# Patient Record
Sex: Female | Born: 1940 | Race: Black or African American | Hispanic: No | Smoking: Never smoker
Health system: Southern US, Community
[De-identification: ages and names within clinical notes are randomized; demographics above are authoritative.]

## PROBLEM LIST (undated history)

## (undated) DIAGNOSIS — M1990 Unspecified osteoarthritis, unspecified site: Secondary | ICD-10-CM

## (undated) DIAGNOSIS — M810 Age-related osteoporosis without current pathological fracture: Secondary | ICD-10-CM

## (undated) DIAGNOSIS — T7840XA Allergy, unspecified, initial encounter: Secondary | ICD-10-CM

## (undated) DIAGNOSIS — IMO0001 Reserved for inherently not codable concepts without codable children: Secondary | ICD-10-CM

## (undated) DIAGNOSIS — R0602 Shortness of breath: Secondary | ICD-10-CM

## (undated) DIAGNOSIS — M545 Low back pain, unspecified: Secondary | ICD-10-CM

## (undated) DIAGNOSIS — C801 Malignant (primary) neoplasm, unspecified: Secondary | ICD-10-CM

## (undated) DIAGNOSIS — Z923 Personal history of irradiation: Secondary | ICD-10-CM

## (undated) DIAGNOSIS — F32A Depression, unspecified: Secondary | ICD-10-CM

## (undated) DIAGNOSIS — E785 Hyperlipidemia, unspecified: Secondary | ICD-10-CM

## (undated) DIAGNOSIS — F329 Major depressive disorder, single episode, unspecified: Secondary | ICD-10-CM

## (undated) DIAGNOSIS — T4145XA Adverse effect of unspecified anesthetic, initial encounter: Secondary | ICD-10-CM

## (undated) DIAGNOSIS — T8859XA Other complications of anesthesia, initial encounter: Secondary | ICD-10-CM

## (undated) DIAGNOSIS — I10 Essential (primary) hypertension: Secondary | ICD-10-CM

## (undated) DIAGNOSIS — C50919 Malignant neoplasm of unspecified site of unspecified female breast: Secondary | ICD-10-CM

## (undated) DIAGNOSIS — C55 Malignant neoplasm of uterus, part unspecified: Secondary | ICD-10-CM

## (undated) DIAGNOSIS — G8929 Other chronic pain: Secondary | ICD-10-CM

## (undated) HISTORY — PX: KNEE ARTHROSCOPY: SHX127

## (undated) HISTORY — DX: Malignant (primary) neoplasm, unspecified: C80.1

## (undated) HISTORY — DX: Age-related osteoporosis without current pathological fracture: M81.0

## (undated) HISTORY — PX: FOOT TENDON SURGERY: SHX958

## (undated) HISTORY — PX: REPLACEMENT TOTAL KNEE: SUR1224

## (undated) HISTORY — DX: Unspecified osteoarthritis, unspecified site: M19.90

## (undated) HISTORY — DX: Hyperlipidemia, unspecified: E78.5

## (undated) HISTORY — PX: TUBAL LIGATION: SHX77

## (undated) HISTORY — PX: DILATION AND CURETTAGE OF UTERUS: SHX78

## (undated) HISTORY — DX: Essential (primary) hypertension: I10

---

## 1968-05-24 HISTORY — PX: CHOLECYSTECTOMY: SHX55

## 2005-10-06 ENCOUNTER — Encounter: Admission: RE | Admit: 2005-10-06 | Discharge: 2005-10-06 | Payer: Self-pay | Admitting: Orthopedic Surgery

## 2006-12-28 ENCOUNTER — Encounter: Admission: RE | Admit: 2006-12-28 | Discharge: 2006-12-28 | Payer: Self-pay | Admitting: Emergency Medicine

## 2008-12-16 ENCOUNTER — Inpatient Hospital Stay (HOSPITAL_COMMUNITY): Admission: RE | Admit: 2008-12-16 | Discharge: 2008-12-19 | Payer: Self-pay | Admitting: Orthopedic Surgery

## 2008-12-20 ENCOUNTER — Ambulatory Visit: Payer: Self-pay | Admitting: *Deleted

## 2008-12-20 ENCOUNTER — Emergency Department (HOSPITAL_COMMUNITY): Admission: EM | Admit: 2008-12-20 | Discharge: 2008-12-20 | Payer: Self-pay | Admitting: Emergency Medicine

## 2008-12-20 ENCOUNTER — Encounter (INDEPENDENT_AMBULATORY_CARE_PROVIDER_SITE_OTHER): Payer: Self-pay | Admitting: Emergency Medicine

## 2010-04-06 ENCOUNTER — Inpatient Hospital Stay (HOSPITAL_COMMUNITY)
Admission: RE | Admit: 2010-04-06 | Discharge: 2010-04-09 | Payer: Self-pay | Source: Home / Self Care | Admitting: Orthopedic Surgery

## 2010-08-04 LAB — BASIC METABOLIC PANEL
CO2: 27 mEq/L (ref 19–32)
CO2: 27 mEq/L (ref 19–32)
Calcium: 8.2 mg/dL — ABNORMAL LOW (ref 8.4–10.5)
Calcium: 8.5 mg/dL (ref 8.4–10.5)
Chloride: 104 mEq/L (ref 96–112)
Creatinine, Ser: 0.86 mg/dL (ref 0.4–1.2)
GFR calc Af Amer: >60 mL/min (ref >60)
GFR calc Af Amer: >60 mL/min (ref >60)
GFR calc non Af Amer: >60 mL/min (ref >60)
Glucose, Bld: 120 mg/dL — ABNORMAL HIGH (ref 70–99)
Potassium: 4.1 mEq/L (ref 3.5–5.1)
Sodium: 135 mEq/L (ref 135–145)
Sodium: 137 mEq/L (ref 135–145)

## 2010-08-04 LAB — DIFFERENTIAL
Basophils Absolute: 0 10*3/uL (ref 0.0–0.1)
Lymphocytes Relative: 34 % (ref 12–46)
Lymphs Abs: 2.1 10*3/uL (ref 0.7–4.0)
Neutro Abs: 3.5 10*3/uL (ref 1.7–7.7)
Neutrophils Relative %: 56 % (ref 43–77)

## 2010-08-04 LAB — CBC
Hemoglobin: 12 g/dL (ref 12.0–15.0)
Hemoglobin: 15.2 g/dL — ABNORMAL HIGH (ref 12.0–15.0)
MCH: 28 pg (ref 26.0–34.0)
MCHC: 32.4 g/dL (ref 30.0–36.0)
MCV: 86.4 fL (ref 78.0–100.0)
MCV: 86.5 fL (ref 78.0–100.0)
Platelets: 161 10*3/uL (ref 150–400)
Platelets: 174 10*3/uL (ref 150–400)
RBC: 4.21 MIL/uL (ref 3.87–5.11)
RBC: 4.36 MIL/uL (ref 3.87–5.11)
WBC: 8.6 10*3/uL (ref 4.0–10.5)
WBC: 9.8 10*3/uL (ref 4.0–10.5)

## 2010-08-04 LAB — URINE CULTURE: Culture  Setup Time: 201111081150

## 2010-08-04 LAB — PROTIME-INR
INR: 0.96 (ref 0.00–1.49)
Prothrombin Time: 13 seconds (ref 11.6–15.2)

## 2010-08-04 LAB — HEMOGLOBIN A1C
Hgb A1c MFr Bld: 6.2 % — ABNORMAL HIGH (ref <5.7)
Mean Plasma Glucose: 131 mg/dL — ABNORMAL HIGH (ref <117)

## 2010-08-04 LAB — COMPREHENSIVE METABOLIC PANEL
AST: 21 U/L (ref 0–37)
BUN: 12 mg/dL (ref 6–23)
CO2: 29 mEq/L (ref 19–32)
Calcium: 9.7 mg/dL (ref 8.4–10.5)
Creatinine, Ser: 1.14 mg/dL (ref 0.4–1.2)
GFR calc Af Amer: 58 mL/min — ABNORMAL LOW (ref >60)
GFR calc non Af Amer: 48 mL/min — ABNORMAL LOW (ref >60)
Glucose, Bld: 119 mg/dL — ABNORMAL HIGH (ref 70–99)

## 2010-08-04 LAB — CROSSMATCH
Antibody Screen: NEGATIVE
Unit division: 0

## 2010-08-04 LAB — GLUCOSE, CAPILLARY
Glucose-Capillary: 102 mg/dL — ABNORMAL HIGH (ref 70–99)
Glucose-Capillary: 108 mg/dL — ABNORMAL HIGH (ref 70–99)
Glucose-Capillary: 116 mg/dL — ABNORMAL HIGH (ref 70–99)
Glucose-Capillary: 120 mg/dL — ABNORMAL HIGH (ref 70–99)
Glucose-Capillary: 123 mg/dL — ABNORMAL HIGH (ref 70–99)
Glucose-Capillary: 129 mg/dL — ABNORMAL HIGH (ref 70–99)
Glucose-Capillary: 133 mg/dL — ABNORMAL HIGH (ref 70–99)

## 2010-08-04 LAB — SURGICAL PCR SCREEN
MRSA, PCR: NEGATIVE
Staphylococcus aureus: NEGATIVE

## 2010-08-04 LAB — URINALYSIS, ROUTINE W REFLEX MICROSCOPIC
Bilirubin Urine: NEGATIVE
Hgb urine dipstick: NEGATIVE
Ketones, ur: NEGATIVE mg/dL
Nitrite: NEGATIVE
Urobilinogen, UA: 0.2 mg/dL (ref 0.0–1.0)

## 2010-08-04 LAB — APTT: aPTT: 29 seconds (ref 24–37)

## 2010-08-30 LAB — COMPREHENSIVE METABOLIC PANEL
ALT: 27 U/L (ref 0–35)
AST: 26 U/L (ref 0–37)
Albumin: 3.7 g/dL (ref 3.5–5.2)
CO2: 23 mEq/L (ref 19–32)
Chloride: 107 mEq/L (ref 96–112)
GFR calc Af Amer: >60 mL/min (ref >60)
GFR calc non Af Amer: >60 mL/min (ref >60)
Potassium: 4.3 mEq/L (ref 3.5–5.1)
Sodium: 141 mEq/L (ref 135–145)
Total Bilirubin: 0.5 mg/dL (ref 0.3–1.2)

## 2010-08-30 LAB — URINALYSIS, ROUTINE W REFLEX MICROSCOPIC
Bilirubin Urine: NEGATIVE
Bilirubin Urine: NEGATIVE
Glucose, UA: NEGATIVE mg/dL
Glucose, UA: NEGATIVE mg/dL
Glucose, UA: NEGATIVE mg/dL
Hgb urine dipstick: NEGATIVE
Hgb urine dipstick: NEGATIVE
Ketones, ur: 15 mg/dL — AB
Ketones, ur: 15 mg/dL — AB
Ketones, ur: NEGATIVE mg/dL
Ketones, ur: NEGATIVE mg/dL
Nitrite: NEGATIVE
Nitrite: NEGATIVE
Protein, ur: 100 mg/dL — AB
Protein, ur: NEGATIVE mg/dL
Specific Gravity, Urine: 1.026 (ref 1.005–1.030)
pH: 5 (ref 5.0–8.0)
pH: 5.5 (ref 5.0–8.0)
pH: 6 (ref 5.0–8.0)

## 2010-08-30 LAB — CBC
HCT: 31.9 % — ABNORMAL LOW (ref 36.0–46.0)
HCT: 33.9 % — ABNORMAL LOW (ref 36.0–46.0)
Hemoglobin: 11.4 g/dL — ABNORMAL LOW (ref 12.0–15.0)
Hemoglobin: 11.4 g/dL — ABNORMAL LOW (ref 12.0–15.0)
MCHC: 33.1 g/dL (ref 30.0–36.0)
MCHC: 33.3 g/dL (ref 30.0–36.0)
MCHC: 33.6 g/dL (ref 30.0–36.0)
MCHC: 33.6 g/dL (ref 30.0–36.0)
MCV: 85.9 fL (ref 78.0–100.0)
MCV: 86.6 fL (ref 78.0–100.0)
Platelets: 212 10*3/uL (ref 150–400)
Platelets: 220 10*3/uL (ref 150–400)
RBC: 3.89 MIL/uL (ref 3.87–5.11)
RBC: 3.98 MIL/uL (ref 3.87–5.11)
RBC: 4.54 MIL/uL (ref 3.87–5.11)
RBC: 5.07 MIL/uL (ref 3.87–5.11)
WBC: 14 10*3/uL — ABNORMAL HIGH (ref 4.0–10.5)
WBC: 6.6 10*3/uL (ref 4.0–10.5)

## 2010-08-30 LAB — POCT I-STAT, CHEM 8
BUN: 10 mg/dL (ref 6–23)
Calcium, Ion: 1.11 mmol/L — ABNORMAL LOW (ref 1.12–1.32)
Creatinine, Ser: 0.9 mg/dL (ref 0.4–1.2)
TCO2: 26 mmol/L (ref 0–100)

## 2010-08-30 LAB — URINE CULTURE
Colony Count: 15000
Colony Count: 15000
Colony Count: >=100000
Colony Count: NO GROWTH
Culture: NO GROWTH

## 2010-08-30 LAB — BASIC METABOLIC PANEL
CO2: 25 mEq/L (ref 19–32)
CO2: 26 mEq/L (ref 19–32)
CO2: 28 mEq/L (ref 19–32)
Calcium: 8.7 mg/dL (ref 8.4–10.5)
Calcium: 8.9 mg/dL (ref 8.4–10.5)
Calcium: 9.1 mg/dL (ref 8.4–10.5)
Chloride: 101 mEq/L (ref 96–112)
Creatinine, Ser: 0.79 mg/dL (ref 0.4–1.2)
GFR calc Af Amer: >60 mL/min (ref >60)
GFR calc Af Amer: >60 mL/min (ref >60)
GFR calc Af Amer: >60 mL/min (ref >60)
GFR calc non Af Amer: >60 mL/min (ref >60)
GFR calc non Af Amer: >60 mL/min (ref >60)
Potassium: 3.8 mEq/L (ref 3.5–5.1)
Potassium: 4.1 mEq/L (ref 3.5–5.1)
Sodium: 134 mEq/L — ABNORMAL LOW (ref 135–145)
Sodium: 135 mEq/L (ref 135–145)
Sodium: 139 mEq/L (ref 135–145)

## 2010-08-30 LAB — DIFFERENTIAL
Basophils Absolute: 0.1 10*3/uL (ref 0.0–0.1)
Basophils Relative: 0 % (ref 0–1)
Eosinophils Absolute: 0.1 10*3/uL (ref 0.0–0.7)
Eosinophils Absolute: 0.1 10*3/uL (ref 0.0–0.7)
Eosinophils Relative: 0 % (ref 0–5)
Eosinophils Relative: 1 % (ref 0–5)
Lymphocytes Relative: 32 % (ref 12–46)
Lymphs Abs: 1.2 10*3/uL (ref 0.7–4.0)
Monocytes Absolute: 0.4 10*3/uL (ref 0.1–1.0)

## 2010-08-30 LAB — PROTIME-INR: Prothrombin Time: 12.8 seconds (ref 11.6–15.2)

## 2010-08-30 LAB — CROSSMATCH

## 2010-08-30 LAB — SEDIMENTATION RATE: Sed Rate: 104 mm/hr — ABNORMAL HIGH (ref 0–22)

## 2010-08-30 LAB — URINE MICROSCOPIC-ADD ON

## 2010-10-06 NOTE — Discharge Summary (Signed)
[name], [name]              ACCOUNT NO.:  [id number]   MEDICAL RECORD NO.:  [id number]          PATIENT TYPE:  INP   LOCATION:  5023                         FACILITY:  [facility]   PHYSICIAN:  [name], M.D. DATE OF BIRTH:  February 28, 1941   DATE OF ADMISSION:  12/16/2008  DATE OF DISCHARGE:  12/19/2008                               DISCHARGE SUMMARY   ADMISSION DIAGNOSES:  1. Right knee osteoarthritis.  2. Morbid obesity.  3. Hypertension.   DISCHARGE DIAGNOSES:  1. Osteoarthritis, right knee.  2. Status post right total knee arthroplasty.  3. Acute blood loss anemia status post surgery.  4. Morbid obesity.  5. Hypertension.   PROCEDURE:  Right total knee arthroplasty.   HISTORY:  This is a 70 year old female with history of severe constant  pain in the right knee that is interfering with activities of daily  living.  Conservative treatments have failed.  The risks and benefits of  surgery were discussed with the patient, and the patient would like to  proceed with the right total hip.   ALLERGIES:  The patient is allergic to PENICILLIN and CELEBREX.   On admission, her medications were:  1. Temazepam 30 mg daily.  2. Darvocet 100 one to two every 6 hours as needed.  3. Metoprolol 25 mg daily.  4. Multivitamin daily.  5. Fish oil daily.   HOSPITAL COURSE:  This is a 70 year old female admitted on December 16, 2008.  After appropriate laboratory studies were obtained preoperatively  as well as vancomycin on-call to the operating room, she was taken to  the OR where she underwent a right TKA.  The patient tolerated the  procedure well and was taken to the PACU in good condition.  The patient  was placed on p.o. medications as well as Dilaudid IV as needed for  severe pain.   On postoperative day #1, vital signs were stable.  The patient denied  chest pain, shortness of breath or calf pain.  The patient was started  on Lovenox 30 mg inject 1 q.12 h., first dose being  8:00 a.m. December 17, 2008.   Consults were made to PT, OT and case management for skilled nursing  facility placement.  The patient was weightbearing as tolerated and was  placed in CPM 0 to 90 degrees 6-8 hours per day, and incentive  spirometry was taught.   Postoperative day #2, the patient continued to progress with physical  therapy.  Dressing was changed.  Marcaine pump and Hemovac were  discontinued.  Foley was left in because the patient was not progressing  swiftly with physical therapy and was still requiring moderate assist.   On postoperative day #3, the patient continued to be slow in physical  therapy.  She had slight few this morning, and the Foley was  discontinued, and the urine was sent for culture.  The patient given  Tylenol for pain and will continue to work on skilled nursing facility  placement.  The patient again denied chest pain, shortness of breath or  calf pain.   LABORATORY STUDIES:  On postoperative day #  1, temperature was 98.5,  pulse 95, respirations 19, blood pressure was stable.  Her white count  was 11.7, H and H 12.9 and 29.0, platelets 182.  Her electrolytes were  139 for sodium, potassium 4.4, chloride 106, CO2 26, BUN 8, creatinine  0.79 and glucose 133.  On date of discharge, the patient's temperature  was up to 101.2 which I discussed earlier and was given Tylenol.  Foley  was discontinued.  Her white cell count was 16.1, H and H was 11.4 and  33.2, platelets 177.  Sodium was 135, potassium was 3.8, chloride was  101, CO2 was 25, BUN was 8, creatinine was 0.89, and glucose was 133.   DISCHARGE INSTRUCTIONS:  There are no restrictions to diet.  The patient  is to follow the wound care sheet for care.  She is to increase activity  slowly.  She may use a cane or walker.  She is weightbearing as  tolerated.  No lifting or driving for 6 weeks.  She is going to be  placed in skilled nursing facility.  The patient is to be in a CPM for 0  to 90  degrees 6-8 hours per day for 2 weeks.  The patient is also to be  in her static progressive brace when she receives it for 8 hours days as  well.  She is to wear TED hose or an Ace bandage on her leg for 3 weeks  as well.   Upon discharge, prescriptions were given for:  1. Lovenox 40 mg inject once subcutaneously daily, last dose being      December 30, 2008.  2. Robaxin 500 mg 1 or 2 tablets every 6-8 hours as needed for spasms.  3. Dilaudid 2 mg 1 tablet every 4 hours as needed for pain.  4. OxyContin 1 twice a day for pain.   FOLLOW UP:  The patient will follow with Dr. Sherlean Leon on December 31, 2008.  She is to call for that appointment at [phone].  The patient is  discharged in improved condition.     ______________________________  Altamese Cabal, PA-C    ______________________________  [name], M.D.    MJ/MEDQ  D:  12/19/2008  T:  12/19/2008  Job:  [id number]

## 2010-10-06 NOTE — Op Note (Signed)
[name], [name]              ACCOUNT NO.:  [id number]   MEDICAL RECORD NO.:  [id number]          PATIENT TYPE:  INP   LOCATION:  2899                         FACILITY:  [facility]   PHYSICIAN:  Mila Homer. [name], M.D. DATE OF BIRTH:  09-18-1940   DATE OF PROCEDURE:  12/16/2008  DATE OF DISCHARGE:                               OPERATIVE REPORT   SURGEON:  Mila Homer. Sherry Foot, MD   ASSISTANTS:  1. Altamese Cabal, PA-C  2. Laural Benes. Su Hilt, PA-C   PREOPERATIVE DIAGNOSIS:  Right knee osteoarthritis.   POSTOPERATIVE DIAGNOSIS:  Right knee osteoarthritis.   PROCEDURE:  Right total knee arthroplasty.   INDICATIONS FOR PROCEDURE:  The patient is a 70 year old black female  with failure of conservative measures for osteoarthritis of the right  knee.  Informed consent was obtained.   DESCRIPTION OF PROCEDURE:  The patient was laid supine and administered  general anesthesia.  The right leg was prepped and draped in the usual  sterile fashion.  The extremity was exsanguinated and tourniquet  inflated to 375 mmHg.  I made a midline incision.  It was approximately  8 cm in length.  Using new blade to make a median parapatellar  arthrotomy, I performed synovectomy.  I elevated the deep MCL off the  medial crest of the tibia and around to the semimembranosus tendon.  I  everted the patella and it measured about 23-mm thick.  I reamed out 9  mm and drilled through lug holes through the 32 template.  With the  prosthetic trial in place, I recreated the 23-mm thickness.  I went into  flexion and used the extramedullary alignment system on the tibia to  make a perpendicular cut to the anatomic axis of the tibia.  I then used  the intramedullary guide on the femur to make a 4-degree valgus cut and  removed two additional millimeters of the distal femur straight away due  to the 20-degree flexion contracture in the knee preoperatively.  I then  marked out the epicondylar axis and posterior condylar  angle measured 5  degrees.  It sized to size F, pinned to the 5-degree external rotation  holes.  I did the anterior-posterior chamfer cuts through the formal  cutting block and sagittal saw.  I placed a lamina spreader to the knee.  I had extreme valgus tightness on the lateral structures.  I wanted to  do extension and after removing the ACL, PCL, medial and lateral  menisci, I performed the pie-crust technique of the lateral structures.  I then got equal flexion/extension gap balance.  I then placed a 10-mm  spacer block in the knee to prove this.  I then finished the femur with  a size F finishing block, finished the tibia with a size 4 tibial tray  drilling keel.  I then trialed with the F femur, 4 tibia, 10 insert, 32  patella and had good patellar tracking.  Flexion was good.  Flexion/extension gap balance was also good.  There was soft tissue  component posteriorly that was not allowing [place] to get through absolute  femoral extension, about  3-5 degrees.  I then removed the components and  released the posterior capsule off the femur and tibia.  With some  pressure, I was able to get femoral extension with measured 2-3 degrees  of flexion contracture.  This was again worse with 20-degree  preoperative extension and a flexion contracture.  I removed the trial  components, copiously irrigated, and cemented all the components  removing excess cement and allowing the cement to harden in extension.  I then irrigated thoroughly and let the tourniquet down at 109 minutes  of pulse lavage.  I placed a Hemovac deep to the arthrotomy coming out  superolaterally.  There was a pain catheter coming out superomedially  and superficially to the arthrotomy.  I closed the arthrotomy with  figure-of-eight #1 Vicryl sutures, deep soft tissues with buried 0-  Vicryl sutures, subcuticular 2-0 Vicryl stitch and skin staples.   COMPLICATIONS:  None.   DRAINS:  One Hemovac and one pain catheter.    ESTIMATED BLOOD LOSS:  300 mL.   TOURNIQUET TIME:  68 minutes.           ______________________________  Mila Homer [name], M.D.     SDL/MEDQ  D:  12/16/2008  T:  12/16/2008  Job:  [id number]

## 2011-02-11 ENCOUNTER — Other Ambulatory Visit: Payer: Self-pay | Admitting: Internal Medicine

## 2011-02-11 DIAGNOSIS — Z1231 Encounter for screening mammogram for malignant neoplasm of breast: Secondary | ICD-10-CM

## 2012-05-24 HISTORY — PX: BREAST LUMPECTOMY: SHX2

## 2012-11-29 ENCOUNTER — Other Ambulatory Visit: Payer: Self-pay | Admitting: Internal Medicine

## 2012-11-29 DIAGNOSIS — N63 Unspecified lump in unspecified breast: Secondary | ICD-10-CM

## 2012-12-11 ENCOUNTER — Ambulatory Visit
Admission: RE | Admit: 2012-12-11 | Discharge: 2012-12-11 | Disposition: A | Discharge status: Home / Self Care | Payer: Medicare Other | Source: Ambulatory Visit | Attending: Internal Medicine | Admitting: Internal Medicine

## 2012-12-11 ENCOUNTER — Other Ambulatory Visit: Payer: Self-pay | Admitting: Internal Medicine

## 2012-12-11 DIAGNOSIS — N63 Unspecified lump in unspecified breast: Secondary | ICD-10-CM

## 2012-12-12 ENCOUNTER — Other Ambulatory Visit: Payer: Self-pay | Admitting: Internal Medicine

## 2012-12-12 DIAGNOSIS — C50912 Malignant neoplasm of unspecified site of left female breast: Secondary | ICD-10-CM

## 2012-12-14 HISTORY — PX: ABDOMINAL HYSTERECTOMY: SHX81

## 2012-12-22 HISTORY — PX: BREAST BIOPSY: SHX20

## 2012-12-26 ENCOUNTER — Ambulatory Visit
Admission: RE | Admit: 2012-12-26 | Discharge: 2012-12-26 | Disposition: A | Discharge status: Home / Self Care | Payer: Medicare Other | Source: Ambulatory Visit | Attending: Internal Medicine | Admitting: Internal Medicine

## 2012-12-26 DIAGNOSIS — C50912 Malignant neoplasm of unspecified site of left female breast: Secondary | ICD-10-CM

## 2012-12-26 MED ORDER — GADOBENATE DIMEGLUMINE 529 MG/ML IV SOLN
20.0000 mL | Freq: Once | INTRAVENOUS | Status: AC | PRN
  Administered 2012-12-26: 20 mL via INTRAVENOUS

## 2013-01-01 ENCOUNTER — Encounter (INDEPENDENT_AMBULATORY_CARE_PROVIDER_SITE_OTHER): Payer: Self-pay | Admitting: General Surgery

## 2013-01-01 ENCOUNTER — Ambulatory Visit (INDEPENDENT_AMBULATORY_CARE_PROVIDER_SITE_OTHER): Payer: Medicare Other | Admitting: General Surgery

## 2013-01-01 VITALS — BP 140/82 | HR 62 | Resp 16 | Ht 66.0 in | Wt 325.0 lb

## 2013-01-01 DIAGNOSIS — C50419 Malignant neoplasm of upper-outer quadrant of unspecified female breast: Secondary | ICD-10-CM

## 2013-01-01 DIAGNOSIS — C50412 Malignant neoplasm of upper-outer quadrant of left female breast: Secondary | ICD-10-CM

## 2013-01-01 NOTE — Patient Instructions (Signed)
[facility] °Office Phone Number [phone] ° °BREAST BIOPSY/ PARTIAL MASTECTOMY: POST OP INSTRUCTIONS ° °Always review your discharge instruction sheet given to you by the facility where your surgery was performed. ° °IF YOU HAVE DISABILITY OR FAMILY LEAVE FORMS, YOU MUST BRING THEM TO THE OFFICE FOR PROCESSING.  DO NOT GIVE THEM TO YOUR DOCTOR. ° °1. A prescription for pain medication may be given to you upon discharge.  Take your pain medication as prescribed, if needed.  If narcotic pain medicine is not needed, then you may take acetaminophen (Tylenol), naprosyn (Alleve) or ibuprofen (Advil) as needed. °2. Take your usually prescribed medications unless otherwise directed °3. If you need a refill on your pain medication, please contact your pharmacy.  They will contact our office to request authorization.  Prescriptions will not be filled after 5pm or on week-ends. °4. You should eat very light the first 24 hours after surgery, such as soup, crackers, pudding, etc.  Resume your normal diet the day after surgery. °5. Most patients will experience some swelling and bruising in the breast.  Ice packs and a good support bra will help.  Wear the breast binder provided or a sports bra for 72 hours day and night.  After that wear a sports bra during the day until you return to the office. Swelling and bruising can take several days to resolve.  °6. It is common to experience some constipation if taking pain medication after surgery.  Increasing fluid intake and taking a stool softener will usually help or prevent this problem from occurring.  A mild laxative (Milk of Magnesia or Miralax) should be taken according to package directions if there are no bowel movements after 48 hours. °7. Unless discharge instructions indicate otherwise, you may remove your bandages 48 hours after surgery and you may shower at that time.  You may have steri-strips (small skin tapes) in place directly over the incision.   These strips should be left on the skin for 7-10 days and will come off on their own.  If your surgeon used skin glue on the incision, you may shower in 24 hours.  The glue will flake off over the next 2-3 weeks.  Any sutures or staples will be removed at the office during your follow-up visit. °8. ACTIVITIES:  You may resume regular daily activities (gradually increasing) beginning the next day.  Wearing a good support bra or sports bra minimizes pain and swelling.  You may have sexual intercourse when it is comfortable. °a. You may drive when you no longer are taking prescription pain medication, you can comfortably wear a seatbelt, and you can safely maneuver your car and apply brakes. °b. RETURN TO WORK:  ______________________________________________________________________________________ °9. You should see your doctor in the office for a follow-up appointment approximately two weeks after your surgery.  Your doctor’s nurse will typically make your follow-up appointment when she calls you with your pathology report.  Expect your pathology report 3-4 business days after your surgery.  You may call to check if you do not hear from us after three days. °10. OTHER INSTRUCTIONS: _______________________________________________________________________________________________ _____________________________________________________________________________________________________________________________________ °_____________________________________________________________________________________________________________________________________ °_____________________________________________________________________________________________________________________________________ ° °WHEN TO CALL DR Julen Rubert: °1. Fever over 101.0 °2. Nausea and/or vomiting. °3. Extreme swelling or bruising. °4. Continued bleeding from incision. °5. Increased pain, redness, or drainage from the incision. ° °The clinic staff is available to  answer your questions during regular business hours.  Please don’t hesitate to call and ask to speak to one of the nurses for   clinical concerns.  If you have a medical emergency, go to the nearest emergency room or call 911.  A surgeon from [facility] is always on call at the hospital. ° °For further questions, please visit [email or web address] ° °

## 2013-01-02 NOTE — Progress Notes (Signed)
Patient ID: [name], female   DOB: 05/17/1941, 72 y.o.   MRN: [id number]  Chief Complaint  Patient presents with  . New Evaluation    lt breast IDC    HPI [name] is a 72 y.o. female.  Referred by Dr Kim Shelton HPI 72 yof who recently underwent what sounds like tah/bso for endometrial cancer at [facility].  She has had a lot of difficulty after that surgery in terms of weakness although overall she sounds like she is recovering pretty well.  She lives alone and this has caused some difficulty as she required a snf for short term.  She and her son are very concerned about that with respect to another operation.  She thought she felt a left breast mass on exam (neither of us can identify this today) and went to get mm.  She had no other breast symptoms.  She has no prior breast history and no family history.  She had mm/us and then mr.  The mr results are listed below and there is a 1.9x1.6x1.5 cm mass at 12 o'clock in the left breast.  There are no abnormalities on the right and the nodes are all normal appearing.  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  She comes in today to discuss options.  Past Medical History  Diagnosis Date  . Arthritis   . Cancer   . Hyperlipidemia   . Hypertension   . Osteoporosis     Past Surgical History  Procedure Laterality Date  . Abdominal hysterectomy    . Replacement total knee Bilateral   . Foot tendon surgery    . Cholecystectomy      History reviewed. No pertinent family history.  Social History History  Substance Use Topics  . Smoking status: Never Smoker   . Smokeless tobacco: Never Used  . Alcohol Use: No    Allergies  Allergen Reactions  . Iodine Rash    Current Outpatient Prescriptions  Medication Sig Dispense Refill  . acetaminophen-codeine (TYLENOL #3) 300-30 MG per tablet       . buPROPion (WELLBUTRIN XL) 150 MG 24 hr tablet        . carisoprodol (SOMA) 350 MG tablet       . diclofenac (VOLTAREN) 75 MG EC tablet       . medroxyPROGESTERone (PROVERA) 10 MG tablet       . methocarbamol (ROBAXIN) 500 MG tablet Take 500 mg by mouth 4 (four) times daily.      . metoprolol succinate (TOPROL-XL) 50 MG 24 hr tablet       . RESTASIS 0.05 % ophthalmic emulsion       . simvastatin (ZOCOR) 20 MG tablet       . HYDROcodone-acetaminophen (NORCO/VICODIN) 5-325 MG per tablet        No current facility-administered medications for this visit.    Review of Systems Review of Systems  Constitutional: Positive for fever and chills. Negative for unexpected weight change.  HENT: Positive for trouble swallowing. Negative for hearing loss, congestion, sore throat and voice change.   Eyes: Negative for visual disturbance.  Respiratory: Positive for cough. Negative for wheezing.   Cardiovascular: Positive for leg swelling. Negative for chest pain and palpitations.  Gastrointestinal: Positive for constipation. Negative for nausea, vomiting, abdominal pain, diarrhea, blood in stool, abdominal distention and anal bleeding.  Genitourinary: Negative for hematuria, vaginal bleeding and difficulty   urinating.  Musculoskeletal: Positive for arthralgias.  Skin: Negative for rash and wound.  Neurological: Positive for weakness. Negative for seizures, syncope and headaches.  Hematological: Negative for adenopathy. Does not bruise/bleed easily.  Psychiatric/Behavioral: Negative for confusion.    Blood pressure 140/82, pulse 62, resp. rate 16, height 5' 6" (1.676 m), weight 325 lb (147.419 kg).  Physical Exam Physical Exam  Vitals reviewed. Constitutional: She appears well-developed.  HENT:  Head: Normocephalic and atraumatic.  Eyes: No scleral icterus.  Neck: Neck supple.  Cardiovascular: Normal rate, regular rhythm and normal heart sounds.   Pulmonary/Chest: Effort normal and breath sounds normal. She has no wheezes. She has no rales. Right  breast exhibits no inverted nipple, no mass, no nipple discharge, no skin change and no tenderness. Left breast exhibits no inverted nipple, no mass, no nipple discharge, no skin change and no tenderness.  Lymphadenopathy:    She has no cervical adenopathy.    Data Reviewed BILATERAL BREAST MRI WITH AND WITHOUT CONTRAST  Technique: Multiplanar, multisequence MR images of both breasts  were obtained prior to and following the intravenous administration  of 28ml of Multihance.  THREE-DIMENSIONAL MR IMAGE RENDERING ON INDEPENDENT WORKSTATION:  Three-dimensional MR images were rendered by post-processing of  the original MR data on an independent DynaCad workstation. The  three-dimensional MR images were interpreted, and findings are  reported in the following complete MRI report for this study.  Comparison: Mammograms dated 12/11/2012, 12/28/2006 and ultrasound  dated 12/11/2012.  FINDINGS:  Breast composition: b: There are scattered areas of  fibroglandular density.  Background parenchymal enhancement: Mild  Right breast: No mass or abnormal enhancement.  Left breast: There is a mildly irregular enhancing mass located  within the posterior one third of the left breast at the 12 o'clock  position with central clip artifact corresponding to the recently  diagnosed invasive ductal carcinoma.This measures 1.9 x 1.6 x 1.5  cm in size. This is associated with plateau enhancement kinetics.  There are no additional worrisome enhancing foci within the left  breast.  Lymph nodes: There are no abnormal appearing axillary lymph nodes  and no evidence for internal mammary adenopathy.  Ancillary findings: None.  IMPRESSION:  1.9 cm irregular enhancing mass located within the left breast at  the 12 o'clock position corresponding to the recently diagnosed  invasive ductal carcinoma. No evidence for adenopathy and no  additional findings.    Assessment    Left breast cancer     Plan     Left breast lumpectomy, left axillary sentinel node biopsy   We discussed the staging and pathophysiology of breast cancer for 90 minutes. Her son and a friend were present.  Ms Vanderwall was in a wheelchair where I had to examine her due to weakness from recent surgery.  She was teary much of the appt.  I think we could wait a little time to do this but she is adamant we do soon.  Additionally her son is currently here and would like to do now. We discussed all of the different options for treatment for breast cancer including surgery, chemotherapy, radiation therapy, Herceptin, and antiestrogen therapy.   We discussed a sentinel lymph node biopsy as she does not appear to having lymph node involvement right now. We discussed the performance of that with injection of radioactive tracer and blue dye. We discussed that she would have an incision underneath her axillary hairline. We discussed that there is a bout a 10-20% chance of having a positive   node with a sentinel lymph node biopsy and we will await the permanent pathology to make any other first further decisions in terms of her treatment. One of these options might be to return to the operating room to perform an axillary lymph node dissection. We discussed about a 1-2% risk lifetime of chronic shoulder pain as well as lymphedema associated with a sentinel lymph node biopsy.  We discussed the options for treatment of the breast cancer which included lumpectomy versus a mastectomy. We discussed the performance of the lumpectomy with a wire placement. We discussed a 10% chance of a positive margin requiring reexcision in the operating room. We also discussed that she may need radiation therapy or antiestrogen therapy or both if she undergoes lumpectomy. We discussed mastectomy and the postoperative care for that as well. We discussed that there is no difference in her survival whether she undergoes lumpectomy with radiation therapy or antiestrogen therapy  versus a mastectomy. There is a slight difference in the local recurrence rate being 3-5% with lumpectomy and about 1% with a mastectomy. We discussed the risks of operation including bleeding, infection, possible reoperation. She understands her further therapy will be based on what her stages at the time of her operation.          Aireal Slater 01/02/2013, 5:42 PM    

## 2013-01-05 ENCOUNTER — Encounter (HOSPITAL_COMMUNITY): Payer: Self-pay

## 2013-01-05 ENCOUNTER — Encounter (HOSPITAL_COMMUNITY)
Admission: RE | Admit: 2013-01-05 | Discharge: 2013-01-05 | Disposition: A | Discharge status: Home / Self Care | Payer: Medicare Other | Source: Ambulatory Visit | Attending: General Surgery | Admitting: General Surgery

## 2013-01-05 ENCOUNTER — Ambulatory Visit (HOSPITAL_COMMUNITY)
Admission: RE | Admit: 2013-01-05 | Discharge: 2013-01-05 | Disposition: A | Discharge status: Home / Self Care | Payer: Medicare Other | Source: Ambulatory Visit | Attending: Anesthesiology | Admitting: Anesthesiology

## 2013-01-05 DIAGNOSIS — Z01812 Encounter for preprocedural laboratory examination: Secondary | ICD-10-CM | POA: Insufficient documentation

## 2013-01-05 DIAGNOSIS — C50919 Malignant neoplasm of unspecified site of unspecified female breast: Secondary | ICD-10-CM | POA: Insufficient documentation

## 2013-01-05 DIAGNOSIS — Z01810 Encounter for preprocedural cardiovascular examination: Secondary | ICD-10-CM | POA: Insufficient documentation

## 2013-01-05 DIAGNOSIS — Z01811 Encounter for preprocedural respiratory examination: Secondary | ICD-10-CM | POA: Insufficient documentation

## 2013-01-05 DIAGNOSIS — I700 Atherosclerosis of aorta: Secondary | ICD-10-CM | POA: Insufficient documentation

## 2013-01-05 HISTORY — DX: Other complications of anesthesia, initial encounter: T88.59XA

## 2013-01-05 HISTORY — DX: Adverse effect of unspecified anesthetic, initial encounter: T41.45XA

## 2013-01-05 HISTORY — DX: Reserved for inherently not codable concepts without codable children: IMO0001

## 2013-01-05 LAB — BASIC METABOLIC PANEL
CO2: 26 mEq/L (ref 19–32)
Calcium: 9.6 mg/dL (ref 8.4–10.5)
GFR calc Af Amer: 76 mL/min — ABNORMAL LOW (ref >90)
GFR calc non Af Amer: 66 mL/min — ABNORMAL LOW (ref >90)
Sodium: 139 mEq/L (ref 135–145)

## 2013-01-05 LAB — CBC
Platelets: 215 10*3/uL (ref 150–400)
RBC: 4.8 MIL/uL (ref 3.87–5.11)
WBC: 5.4 10*3/uL (ref 4.0–10.5)

## 2013-01-05 NOTE — Pre-Procedure Instructions (Addendum)
[name]  01/05/2013  Your procedure is scheduled on:  8.21.14  Report to [facility] at 800 AM. Or when finished at breast center  Call this number if you have problems the morning of surgery: [phone]   Remember:   Do not eat food or drink liquids after midnight.   Take these medicines the morning of surgery with A SIP OF WATER: wellbutrin, pain med,  metoprolol,             STOP  Diclofenac on 8.16.14   Do not wear jewelry, make-up or nail polish.  Do not wear lotions, powders, or perfumes. You may wear deodorant.  Do not shave 48 hours prior to surgery. Men may shave face and neck.  Do not bring valuables to the hospital.  [facility] is not responsible                   for any belongings or valuables.  Contacts, dentures or bridgework may not be worn into surgery.  Leave suitcase in the car. After surgery it may be brought to your room.  For patients admitted to the hospital, checkout time is 11:00 AM the day of  discharge.   Patients discharged the day of surgery will not be allowed to drive  home.  Name and phone number of your driver:   Special Instructions: Shower using CHG 2 nights before surgery and the night before surgery.  If you shower the day of surgery use CHG.  Use special wash - you have one bottle of CHG for all showers.  You should use approximately 1/3 of the bottle for each shower.   Please read over the following fact sheets that you were given: Pain Booklet, Coughing and Deep Breathing and Surgical Site Infection Prevention

## 2013-01-05 NOTE — Progress Notes (Signed)
01/05/13 1409  OBSTRUCTIVE SLEEP APNEA  Have you ever been diagnosed with sleep apnea through a sleep study? No  Do you snore loudly (loud enough to be heard through closed doors)?  0  Do you often feel tired, fatigued, or sleepy during the daytime? 1  Has anyone observed you stop breathing during your sleep? 0  Do you have, or are you being treated for high blood pressure? 1  BMI more than 35 kg/m2? 1  Age over 70 years old? 1  Neck circumference greater than 40 cm/18 inches? 0  Gender: 0  Obstructive Sleep Apnea Score 4  Score 4 or greater  Results sent to PCP

## 2013-01-09 ENCOUNTER — Encounter: Payer: Self-pay | Admitting: Podiatry

## 2013-01-09 ENCOUNTER — Ambulatory Visit (INDEPENDENT_AMBULATORY_CARE_PROVIDER_SITE_OTHER): Payer: Medicare Other | Admitting: Podiatry

## 2013-01-09 VITALS — Ht 66.0 in | Wt 300.0 lb

## 2013-01-09 DIAGNOSIS — R609 Edema, unspecified: Secondary | ICD-10-CM | POA: Insufficient documentation

## 2013-01-09 DIAGNOSIS — B351 Tinea unguium: Secondary | ICD-10-CM | POA: Insufficient documentation

## 2013-01-09 DIAGNOSIS — M25579 Pain in unspecified ankle and joints of unspecified foot: Secondary | ICD-10-CM | POA: Insufficient documentation

## 2013-01-09 NOTE — Progress Notes (Signed)
Subjective: 72 year old female presents with her son complaining of swelling, numbness and tingling on feet, and long toe nails. She has had both knee replacement and having swelling her legs since then. Also gained about 100lbs in last 3 years. Patient has had Percutaneous Tendo Achilles Lengthening on right lower limb 3 years ago, and recovered with satisfactory result.   Objective: Bilateral foot, ankle, and lower limb edema. Pain on ball of right foot. High arched foot with contracted lesser digits bilateral. Pedal pulses are not palpable. Hypertrophic nails x 10.   Plan: Water exercise. Compression socks. Debride mycotic nails. Return in one month to monitor leg edema.

## 2013-01-10 MED ORDER — DEXTROSE 5 % IV SOLN
3.0000 g | INTRAVENOUS | Status: AC
  Administered 2013-01-11: 3 g via INTRAVENOUS
  Filled 2013-01-10: qty 3000

## 2013-01-11 ENCOUNTER — Other Ambulatory Visit (INDEPENDENT_AMBULATORY_CARE_PROVIDER_SITE_OTHER): Payer: Self-pay | Admitting: General Surgery

## 2013-01-11 ENCOUNTER — Encounter (HOSPITAL_COMMUNITY): Payer: Self-pay | Admitting: Anesthesiology

## 2013-01-11 ENCOUNTER — Encounter (HOSPITAL_COMMUNITY)
Admission: RE | Admit: 2013-01-11 | Discharge: 2013-01-11 | Disposition: A | Discharge status: Home / Self Care | Payer: Medicare Other | Source: Ambulatory Visit | Attending: General Surgery | Admitting: General Surgery

## 2013-01-11 ENCOUNTER — Ambulatory Visit
Admission: RE | Admit: 2013-01-11 | Discharge: 2013-01-11 | Disposition: A | Discharge status: Home / Self Care | Payer: Medicare Other | Source: Ambulatory Visit | Attending: General Surgery | Admitting: General Surgery

## 2013-01-11 ENCOUNTER — Inpatient Hospital Stay (HOSPITAL_COMMUNITY): Payer: Medicare Other | Admitting: Anesthesiology

## 2013-01-11 ENCOUNTER — Encounter (HOSPITAL_COMMUNITY)
Admission: RE | Disposition: A | Discharge status: Skilled Nursing Facility | Payer: Self-pay | Source: Ambulatory Visit | Attending: General Surgery

## 2013-01-11 ENCOUNTER — Observation Stay (HOSPITAL_COMMUNITY)
Admission: RE | Admit: 2013-01-11 | Discharge: 2013-01-12 | Disposition: A | Discharge status: Skilled Nursing Facility | Payer: Medicare Other | Source: Ambulatory Visit | Attending: General Surgery | Admitting: General Surgery

## 2013-01-11 DIAGNOSIS — C55 Malignant neoplasm of uterus, part unspecified: Secondary | ICD-10-CM | POA: Insufficient documentation

## 2013-01-11 DIAGNOSIS — C50412 Malignant neoplasm of upper-outer quadrant of left female breast: Secondary | ICD-10-CM

## 2013-01-11 DIAGNOSIS — C7981 Secondary malignant neoplasm of breast: Secondary | ICD-10-CM | POA: Insufficient documentation

## 2013-01-11 DIAGNOSIS — C50919 Malignant neoplasm of unspecified site of unspecified female breast: Secondary | ICD-10-CM

## 2013-01-11 DIAGNOSIS — I10 Essential (primary) hypertension: Secondary | ICD-10-CM | POA: Insufficient documentation

## 2013-01-11 DIAGNOSIS — N63 Unspecified lump in unspecified breast: Principal | ICD-10-CM | POA: Insufficient documentation

## 2013-01-11 DIAGNOSIS — C50419 Malignant neoplasm of upper-outer quadrant of unspecified female breast: Secondary | ICD-10-CM | POA: Insufficient documentation

## 2013-01-11 HISTORY — DX: Low back pain: M54.5

## 2013-01-11 HISTORY — DX: Other chronic pain: G89.29

## 2013-01-11 HISTORY — DX: Malignant neoplasm of uterus, part unspecified: C55

## 2013-01-11 HISTORY — PX: BREAST LUMPECTOMY WITH NEEDLE LOCALIZATION AND AXILLARY SENTINEL LYMPH NODE BX: SHX5760

## 2013-01-11 HISTORY — DX: Low back pain, unspecified: M54.50

## 2013-01-11 HISTORY — DX: Depression, unspecified: F32.A

## 2013-01-11 HISTORY — DX: Malignant neoplasm of unspecified site of unspecified female breast: C50.919

## 2013-01-11 HISTORY — DX: Shortness of breath: R06.02

## 2013-01-11 HISTORY — DX: Major depressive disorder, single episode, unspecified: F32.9

## 2013-01-11 LAB — CBC
HCT: 35.9 % — ABNORMAL LOW (ref 36.0–46.0)
Hemoglobin: 11.7 g/dL — ABNORMAL LOW (ref 12.0–15.0)
MCH: 25.4 pg — ABNORMAL LOW (ref 26.0–34.0)
MCHC: 32.6 g/dL (ref 30.0–36.0)
MCV: 78 fL (ref 78.0–100.0)
Platelets: 200 10*3/uL (ref 150–400)
RBC: 4.6 MIL/uL (ref 3.87–5.11)
RDW: 18.2 % — ABNORMAL HIGH (ref 11.5–15.5)
WBC: 6.4 10*3/uL (ref 4.0–10.5)

## 2013-01-11 LAB — CREATININE, SERUM
Creatinine, Ser: 0.95 mg/dL (ref 0.50–1.10)
GFR calc Af Amer: 69 mL/min — ABNORMAL LOW (ref >90)
GFR calc non Af Amer: 59 mL/min — ABNORMAL LOW (ref >90)

## 2013-01-11 SURGERY — BREAST LUMPECTOMY WITH NEEDLE LOCALIZATION AND AXILLARY SENTINEL LYMPH NODE BX
Anesthesia: General | Site: Breast | Laterality: Left | Wound class: Clean

## 2013-01-11 MED ORDER — TECHNETIUM TC 99M SULFUR COLLOID FILTERED
1.0000 | Freq: Once | INTRAVENOUS | Status: AC | PRN
  Administered 2013-01-11: 1 via INTRADERMAL

## 2013-01-11 MED ORDER — SUCCINYLCHOLINE CHLORIDE 20 MG/ML IJ SOLN
INTRAMUSCULAR | Status: DC | PRN
  Administered 2013-01-11: 120 mg via INTRAVENOUS

## 2013-01-11 MED ORDER — DICLOFENAC SODIUM 75 MG PO TBEC
75.0000 mg | DELAYED_RELEASE_TABLET | Freq: Two times a day (BID) | ORAL | Status: DC
  Administered 2013-01-11 – 2013-01-12 (×3): 75 mg via ORAL
  Filled 2013-01-11 (×5): qty 1

## 2013-01-11 MED ORDER — HYDROMORPHONE HCL PF 1 MG/ML IJ SOLN
INTRAMUSCULAR | Status: AC
  Administered 2013-01-11: 0.5 mg via INTRAVENOUS
  Filled 2013-01-11: qty 1

## 2013-01-11 MED ORDER — ONDANSETRON HCL 4 MG/2ML IJ SOLN
INTRAMUSCULAR | Status: DC | PRN
  Administered 2013-01-11: 4 mg via INTRAVENOUS

## 2013-01-11 MED ORDER — FENTANYL CITRATE 0.05 MG/ML IJ SOLN
INTRAMUSCULAR | Status: DC | PRN
  Administered 2013-01-11: 100 ug via INTRAVENOUS
  Administered 2013-01-11: 150 ug via INTRAVENOUS

## 2013-01-11 MED ORDER — POLYETHYLENE GLYCOL 3350 17 G PO PACK
17.0000 g | PACK | Freq: Every day | ORAL | Status: DC
  Administered 2013-01-12: 17 g via ORAL
  Filled 2013-01-11: qty 1

## 2013-01-11 MED ORDER — PANTOPRAZOLE SODIUM 40 MG IV SOLR
40.0000 mg | Freq: Every day | INTRAVENOUS | Status: DC
  Administered 2013-01-11: 40 mg via INTRAVENOUS
  Filled 2013-01-11 (×2): qty 40

## 2013-01-11 MED ORDER — HYDROCODONE-ACETAMINOPHEN 10-325 MG PO TABS
1.0000 | ORAL_TABLET | Freq: Four times a day (QID) | ORAL | Status: DC | PRN
  Administered 2013-01-11 – 2013-01-12 (×3): 2 via ORAL
  Filled 2013-01-11 (×3): qty 2

## 2013-01-11 MED ORDER — SENNOSIDES-DOCUSATE SODIUM 8.6-50 MG PO TABS
1.0000 | ORAL_TABLET | Freq: Every day | ORAL | Status: DC
  Administered 2013-01-11 – 2013-01-12 (×2): 1 via ORAL
  Filled 2013-01-11 (×2): qty 1

## 2013-01-11 MED ORDER — HYDROMORPHONE HCL PF 1 MG/ML IJ SOLN
0.2500 mg | INTRAMUSCULAR | Status: DC | PRN
  Administered 2013-01-11: 0.5 mg via INTRAVENOUS

## 2013-01-11 MED ORDER — SIMVASTATIN 20 MG PO TABS
20.0000 mg | ORAL_TABLET | Freq: Every day | ORAL | Status: DC
  Administered 2013-01-11: 20 mg via ORAL
  Filled 2013-01-11 (×2): qty 1

## 2013-01-11 MED ORDER — SODIUM CHLORIDE 0.9 % IJ SOLN
INTRAMUSCULAR | Status: DC | PRN
  Administered 2013-01-11: 11:00:00 via INTRAMUSCULAR

## 2013-01-11 MED ORDER — HEPARIN SODIUM (PORCINE) 5000 UNIT/ML IJ SOLN
5000.0000 [IU] | Freq: Three times a day (TID) | INTRAMUSCULAR | Status: DC
  Administered 2013-01-11 – 2013-01-12 (×3): 5000 [IU] via SUBCUTANEOUS
  Filled 2013-01-11 (×7): qty 1

## 2013-01-11 MED ORDER — ONDANSETRON HCL 4 MG/2ML IJ SOLN
INTRAMUSCULAR | Status: AC
  Filled 2013-01-11: qty 2

## 2013-01-11 MED ORDER — SODIUM CHLORIDE 0.9 % IV SOLN
INTRAVENOUS | Status: DC
  Administered 2013-01-11: 17:00:00 via INTRAVENOUS

## 2013-01-11 MED ORDER — MIDAZOLAM HCL 5 MG/5ML IJ SOLN
INTRAMUSCULAR | Status: DC | PRN
  Administered 2013-01-11: 2 mg via INTRAVENOUS

## 2013-01-11 MED ORDER — METOPROLOL SUCCINATE ER 50 MG PO TB24
50.0000 mg | ORAL_TABLET | Freq: Two times a day (BID) | ORAL | Status: DC
Start: 2013-01-11 — End: 2013-01-12
  Administered 2013-01-11: 50 mg via ORAL
  Filled 2013-01-11 (×3): qty 1

## 2013-01-11 MED ORDER — MORPHINE SULFATE 2 MG/ML IJ SOLN: 2.0000 mg | INTRAMUSCULAR | Status: DC | PRN

## 2013-01-11 MED ORDER — ACETAMINOPHEN 650 MG RE SUPP: 650.0000 mg | Freq: Four times a day (QID) | RECTAL | Status: DC | PRN

## 2013-01-11 MED ORDER — METHYLENE BLUE 1 % INJ SOLN
INTRAMUSCULAR | Status: AC
  Filled 2013-01-11: qty 10

## 2013-01-11 MED ORDER — CYCLOSPORINE 0.05 % OP EMUL
1.0000 [drp] | Freq: Two times a day (BID) | OPHTHALMIC | Status: DC
  Administered 2013-01-11: 1 [drp] via OPHTHALMIC
  Administered 2013-01-12: 10:00:00 via OPHTHALMIC
  Filled 2013-01-11 (×3): qty 1

## 2013-01-11 MED ORDER — LACTATED RINGERS IV SOLN
INTRAVENOUS | Status: DC | PRN
  Administered 2013-01-11 (×2): via INTRAVENOUS

## 2013-01-11 MED ORDER — ONDANSETRON HCL 4 MG/2ML IJ SOLN
4.0000 mg | Freq: Once | INTRAMUSCULAR | Status: AC | PRN
  Administered 2013-01-11: 4 mg via INTRAVENOUS

## 2013-01-11 MED ORDER — LIDOCAINE HCL (CARDIAC) 20 MG/ML IV SOLN
INTRAVENOUS | Status: DC | PRN
  Administered 2013-01-11: 50 mg via INTRAVENOUS

## 2013-01-11 MED ORDER — PHENYLEPHRINE HCL 10 MG/ML IJ SOLN
10.0000 mg | INTRAVENOUS | Status: DC | PRN
  Administered 2013-01-11: 50 ug/min via INTRAVENOUS

## 2013-01-11 MED ORDER — BUPIVACAINE-EPINEPHRINE 0.25% -1:200000 IJ SOLN
INTRAMUSCULAR | Status: DC | PRN
  Administered 2013-01-11: 20 mL

## 2013-01-11 MED ORDER — PHENYLEPHRINE HCL 10 MG/ML IJ SOLN
INTRAMUSCULAR | Status: DC | PRN
  Administered 2013-01-11: 80 ug via INTRAVENOUS
  Administered 2013-01-11: 40 ug via INTRAVENOUS
  Administered 2013-01-11: 80 ug via INTRAVENOUS
  Administered 2013-01-11: 40 ug via INTRAVENOUS
  Administered 2013-01-11: 120 ug via INTRAVENOUS
  Administered 2013-01-11 (×2): 80 ug via INTRAVENOUS
  Administered 2013-01-11: 40 ug via INTRAVENOUS
  Administered 2013-01-11: 120 ug via INTRAVENOUS
  Administered 2013-01-11: 80 ug via INTRAVENOUS

## 2013-01-11 MED ORDER — EPHEDRINE SULFATE 50 MG/ML IJ SOLN
INTRAMUSCULAR | Status: DC | PRN
  Administered 2013-01-11 (×2): 5 mg via INTRAVENOUS
  Administered 2013-01-11 (×2): 10 mg via INTRAVENOUS
  Administered 2013-01-11: 5 mg via INTRAVENOUS
  Administered 2013-01-11: 15 mg via INTRAVENOUS

## 2013-01-11 MED ORDER — FENTANYL CITRATE 0.05 MG/ML IJ SOLN
INTRAMUSCULAR | Status: AC
  Filled 2013-01-11: qty 2

## 2013-01-11 MED ORDER — BUPROPION HCL ER (XL) 150 MG PO TB24
150.0000 mg | ORAL_TABLET | Freq: Every morning | ORAL | Status: DC
  Administered 2013-01-11 – 2013-01-12 (×2): 150 mg via ORAL
  Filled 2013-01-11 (×2): qty 1

## 2013-01-11 MED ORDER — 0.9 % SODIUM CHLORIDE (POUR BTL) OPTIME
TOPICAL | Status: DC | PRN
  Administered 2013-01-11: 1000 mL

## 2013-01-11 MED ORDER — ONDANSETRON HCL 4 MG/2ML IJ SOLN: 4.0000 mg | Freq: Four times a day (QID) | INTRAMUSCULAR | Status: DC | PRN

## 2013-01-11 MED ORDER — ACETAMINOPHEN 325 MG PO TABS: 650.0000 mg | ORAL_TABLET | Freq: Four times a day (QID) | ORAL | Status: DC | PRN

## 2013-01-11 MED ORDER — BUPIVACAINE-EPINEPHRINE PF 0.25-1:200000 % IJ SOLN
INTRAMUSCULAR | Status: AC
  Filled 2013-01-11: qty 30

## 2013-01-11 MED ORDER — HYDROMORPHONE HCL PF 1 MG/ML IJ SOLN: 0.2500 mg | INTRAMUSCULAR | Status: DC | PRN

## 2013-01-11 MED ORDER — PROPOFOL 10 MG/ML IV BOLUS
INTRAVENOUS | Status: DC | PRN
  Administered 2013-01-11: 150 mg via INTRAVENOUS
  Administered 2013-01-11: 50 mg via INTRAVENOUS

## 2013-01-11 SURGICAL SUPPLY — 55 items
APPLIER CLIP 9.375 MED OPEN (MISCELLANEOUS) ×2
BENZOIN TINCTURE PRP APPL 2/3 (GAUZE/BANDAGES/DRESSINGS) ×2 IMPLANT
BINDER BREAST LRG (GAUZE/BANDAGES/DRESSINGS) IMPLANT
BINDER BREAST XLRG (GAUZE/BANDAGES/DRESSINGS) IMPLANT
BINDER BREAST XXLRG (GAUZE/BANDAGES/DRESSINGS) ×2 IMPLANT
BLADE SURG 10 STRL SS (BLADE) ×2 IMPLANT
BLADE SURG 15 STRL LF DISP TIS (BLADE) ×1 IMPLANT
BLADE SURG 15 STRL SS (BLADE) ×1
CANISTER SUCTION 2500CC (MISCELLANEOUS) ×2 IMPLANT
CHLORAPREP W/TINT 26ML (MISCELLANEOUS) ×2 IMPLANT
CLIP APPLIE 9.375 MED OPEN (MISCELLANEOUS) ×1 IMPLANT
CLOTH BEACON ORANGE TIMEOUT ST (SAFETY) ×2 IMPLANT
CONT SPEC STER OR (MISCELLANEOUS) ×8 IMPLANT
COVER PROBE W GEL 5X96 (DRAPES) ×2 IMPLANT
COVER SURGICAL LIGHT HANDLE (MISCELLANEOUS) ×2 IMPLANT
DERMABOND ADVANCED (GAUZE/BANDAGES/DRESSINGS) ×2
DERMABOND ADVANCED .7 DNX12 (GAUZE/BANDAGES/DRESSINGS) ×2 IMPLANT
DEVICE DUBIN SPECIMEN MAMMOGRA (MISCELLANEOUS) ×2 IMPLANT
DRAPE CHEST BREAST 15X10 FENES (DRAPES) ×2 IMPLANT
DRSG PAD ABDOMINAL 8X10 ST (GAUZE/BANDAGES/DRESSINGS) IMPLANT
DRSG TEGADERM 4X4.75 (GAUZE/BANDAGES/DRESSINGS) ×2 IMPLANT
ELECT CAUTERY BLADE 6.4 (BLADE) ×2 IMPLANT
ELECT REM PT RETURN 9FT ADLT (ELECTROSURGICAL) ×2
ELECTRODE REM PT RTRN 9FT ADLT (ELECTROSURGICAL) ×1 IMPLANT
GLOVE BIO SURGEON STRL SZ7 (GLOVE) ×2 IMPLANT
GLOVE BIO SURGEON STRL SZ7.5 (GLOVE) ×4 IMPLANT
GLOVE BIOGEL PI IND STRL 7.5 (GLOVE) ×1 IMPLANT
GLOVE BIOGEL PI INDICATOR 7.5 (GLOVE) ×1
GOWN STRL NON-REIN LRG LVL3 (GOWN DISPOSABLE) ×4 IMPLANT
KIT BASIN OR (CUSTOM PROCEDURE TRAY) ×2 IMPLANT
KIT MARKER MARGIN INK (KITS) ×2 IMPLANT
KIT ROOM TURNOVER OR (KITS) ×2 IMPLANT
NEEDLE 18GX1X1/2 (RX/OR ONLY) (NEEDLE) ×2 IMPLANT
NEEDLE HYPO 25GX1X1/2 BEV (NEEDLE) ×4 IMPLANT
NS IRRIG 1000ML POUR BTL (IV SOLUTION) ×2 IMPLANT
PACK SURGICAL SETUP 50X90 (CUSTOM PROCEDURE TRAY) ×2 IMPLANT
PAD ARMBOARD 7.5X6 YLW CONV (MISCELLANEOUS) ×2 IMPLANT
PENCIL BUTTON HOLSTER BLD 10FT (ELECTRODE) ×2 IMPLANT
SLEEVE SURGEON STRL (DRAPES) ×2 IMPLANT
SPONGE GAUZE 4X4 12PLY (GAUZE/BANDAGES/DRESSINGS) ×2 IMPLANT
SPONGE LAP 18X18 X RAY DECT (DISPOSABLE) ×2 IMPLANT
STAPLER VISISTAT 35W (STAPLE) ×2 IMPLANT
STRIP CLOSURE SKIN 1/2X4 (GAUZE/BANDAGES/DRESSINGS) ×2 IMPLANT
SUT MNCRL AB 4-0 PS2 18 (SUTURE) ×4 IMPLANT
SUT SILK 2 0 SH (SUTURE) ×2 IMPLANT
SUT VIC AB 2-0 SH 27 (SUTURE) ×3
SUT VIC AB 2-0 SH 27XBRD (SUTURE) ×3 IMPLANT
SUT VIC AB 3-0 SH 27 (SUTURE) ×2
SUT VIC AB 3-0 SH 27XBRD (SUTURE) ×2 IMPLANT
SYR BULB 3OZ (MISCELLANEOUS) ×2 IMPLANT
SYR CONTROL 10ML LL (SYRINGE) ×4 IMPLANT
TOWEL OR 17X24 6PK STRL BLUE (TOWEL DISPOSABLE) ×2 IMPLANT
TOWEL OR 17X26 10 PK STRL BLUE (TOWEL DISPOSABLE) ×2 IMPLANT
TUBE CONNECTING 12X1/4 (SUCTIONS) ×2 IMPLANT
YANKAUER SUCT BULB TIP NO VENT (SUCTIONS) ×2 IMPLANT

## 2013-01-11 NOTE — Anesthesia Preprocedure Evaluation (Addendum)
Anesthesia Evaluation  Patient identified by MRN, date of birth, ID band Patient awake    Reviewed: Allergy & Precautions, H&P , NPO status   Airway Mallampati: II      Dental   Pulmonary neg pulmonary ROS,          Cardiovascular hypertension, Pt. on medications and Pt. on home beta blockers Rhythm:Regular Rate:Normal     Neuro/Psych negative neurological ROS  negative psych ROS   GI/Hepatic negative GI ROS, Neg liver ROS,   Endo/Other  Morbid obesityLeft breast CA  Renal/GU negative Renal ROS  negative genitourinary   Musculoskeletal  (+) Arthritis -, Osteoarthritis,    Abdominal   Peds  Hematology negative hematology ROS (+)   Anesthesia Other Findings   Reproductive/Obstetrics negative OB ROS                         Anesthesia Physical Anesthesia Plan  ASA: III  Anesthesia Plan: General   Post-op Pain Management:    Induction: Intravenous  Airway Management Planned: Oral ETT  Additional Equipment:   Intra-op Plan:   Post-operative Plan: Extubation in OR  Informed Consent: I have reviewed the patients History and Physical, chart, labs and discussed the procedure including the risks, benefits and alternatives for the proposed anesthesia with the patient or authorized representative who has indicated his/her understanding and acceptance.   Dental advisory given  Plan Discussed with: CRNA, Anesthesiologist and Surgeon  Anesthesia Plan Comments:         Anesthesia Quick Evaluation

## 2013-01-11 NOTE — Op Note (Signed)
Preoperative diagnosis: Clinical stage I left breast cancer Postoperative diagnosis: Same as above Procedure: #1 left breast wire-guided lumpectomy #2 left axillary sentinel lymph node biopsy #3 injection of methylene blue for sentinel node identification Surgeon: Dr. Harden Mo Anesthesia: Gen. Estimated blood loss: Minimal Drains: None Specimens: #1 left breast marked with paint #2 additional posterior margin marked short superior, long lateral, and double deep #3 left axillary sentinel lymph node x3 Complications: None Sponge and needle count correct Disposition to recovery stable  Indications: This is a 72 year old female who thought she palpated a left breast mass. She has recently undergone a hysterectomy. Evaluation showed a 1.9 cm left breast cancer with no other abnormalities. 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 She was seen in our multidisciplinary conference. We decided to proceed with breast conservation therapy.  Procedure: After informed consent was obtained the patient was first taken to the breast center where she had a wire placed. I had these mammograms in the operating room. She was given 3 g of intravenous cefazolin. Sequential compression devices were her legs. She was placed under general anesthesia without complication. Her left breast and axilla were then prepped and draped in the standard sterile surgical fashion. A surgical timeout was performed.  I infiltrated Marcaine throughout the left breast. I then made a curvilinear incision overlying the lesion. I then removed the wire and area around it in an attempt to get a clear margin. This was then marked with paint. This was passed off the table as a specimen. A mammogram showed that the clip, wire, and mass were all present. I taken additional piece of the posterior margin it did not go all the way down to the pectoralis muscle due to the size of her breast. I placed clips around the cavity.  Two clips were placed in a deep position. Hemostasis was obtained. Irrigation was performed. I closed the breast tissue with a 2-0 Vicryl. The dermis was closed with 3-0 Vicryl. The skin was closed with 4-0 Monocryl and Dermabond and Steri-Strips were applied.  I then made a 3 cm incision below her axillary hairline. I used the neoprobe to identify what appeared to be 3 sentinel lymph nodes with counts ranging from 100-192. There was no background radioactivity upon completion. There was a small bleeding vessel that I did place 2 clips in the axilla. Hemostasis was then observed. I closed the axillary fascia with 2-0 Vicryl. The remainder of the wound was closed with 3-0 Vicryl and 4-0 Monocryl. Steri-Strips and a sterile dressing were placed. She tolerated this well was extubated and transferred to recovery in stable condition.

## 2013-01-11 NOTE — Anesthesia Postprocedure Evaluation (Signed)
  Anesthesia Post-op Note  Patient: [name]  Procedure(s) Performed: Procedure(s): BREAST LUMPECTOMY WITH NEEDLE LOCALIZATION AND AXILLARY SENTINEL LYMPH NODE BX (Left)  Patient Location: PACU  Anesthesia Type:General  Level of Consciousness: awake, alert , oriented and patient cooperative  Airway and Oxygen Therapy: Patient Spontanous Breathing  Post-op Pain: mild  Post-op Assessment: Post-op Vital signs reviewed, Patient's Cardiovascular Status Stable, Respiratory Function Stable, Patent Airway, No signs of Nausea or vomiting and Pain level controlled  Post-op Vital Signs: stable  Complications: No apparent anesthesia complications

## 2013-01-11 NOTE — H&P (View-Only) (Signed)
Patient ID: [name], female   DOB: 1941-05-24, 72 y.o.   MRN: [id number]  Chief Complaint  Patient presents with  . New Evaluation    lt breast IDC    HPI [name] is a 72 y.o. female.  Referred by Dr [name] HPI 42 yof who recently underwent what sounds like tah/bso for endometrial cancer at [facility].  She has had a lot of difficulty after that surgery in terms of weakness although overall she sounds like she is recovering pretty well.  She lives alone and this has caused some difficulty as she required a snf for short term.  She and her son are very concerned about that with respect to another operation.  She thought she felt a left breast mass on exam (neither of [place] can identify this today) and went to get mm.  She had no other breast symptoms.  She has no prior breast history and no family history.  She had mm/us and then mr.  The mr results are listed below and there is a 1.9x1.6x1.5 cm mass at 12 o'clock in the left breast.  There are no abnormalities on the right and the nodes are all normal appearing.  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  She comes in today to discuss options.  Past Medical History  Diagnosis Date  . Arthritis   . Cancer   . Hyperlipidemia   . Hypertension   . Osteoporosis     Past Surgical History  Procedure Laterality Date  . Abdominal hysterectomy    . Replacement total knee Bilateral   . Foot tendon surgery    . Cholecystectomy      History reviewed. No pertinent family history.  Social History History  Substance Use Topics  . Smoking status: Never Smoker   . Smokeless tobacco: Never Used  . Alcohol Use: No    Allergies  Allergen Reactions  . Iodine Rash    Current Outpatient Prescriptions  Medication Sig Dispense Refill  . acetaminophen-codeine (TYLENOL #3) 300-30 MG per tablet       . buPROPion (WELLBUTRIN XL) 150 MG 24 hr tablet        . carisoprodol (SOMA) 350 MG tablet       . diclofenac (VOLTAREN) 75 MG EC tablet       . medroxyPROGESTERone (PROVERA) 10 MG tablet       . methocarbamol (ROBAXIN) 500 MG tablet Take 500 mg by mouth 4 (four) times daily.      . metoprolol succinate (TOPROL-XL) 50 MG 24 hr tablet       . RESTASIS 0.05 % ophthalmic emulsion       . simvastatin (ZOCOR) 20 MG tablet       . HYDROcodone-acetaminophen (NORCO/VICODIN) 5-325 MG per tablet        No current facility-administered medications for this visit.    Review of Systems Review of Systems  Constitutional: Positive for fever and chills. Negative for unexpected weight change.  HENT: Positive for trouble swallowing. Negative for hearing loss, congestion, sore throat and voice change.   Eyes: Negative for visual disturbance.  Respiratory: Positive for cough. Negative for wheezing.   Cardiovascular: Positive for leg swelling. Negative for chest pain and palpitations.  Gastrointestinal: Positive for constipation. Negative for nausea, vomiting, abdominal pain, diarrhea, blood in stool, abdominal distention and anal bleeding.  Genitourinary: Negative for hematuria, vaginal bleeding and difficulty  urinating.  Musculoskeletal: Positive for arthralgias.  Skin: Negative for rash and wound.  Neurological: Positive for weakness. Negative for seizures, syncope and headaches.  Hematological: Negative for adenopathy. Does not bruise/bleed easily.  Psychiatric/Behavioral: Negative for confusion.    Blood pressure 140/82, pulse 62, resp. rate 16, height 5\' 6"  (1.676 m), weight 325 lb (147.419 kg).  Physical Exam Physical Exam  Vitals reviewed. Constitutional: She appears well-developed.  HENT:  Head: Normocephalic and atraumatic.  Eyes: No scleral icterus.  Neck: Neck supple.  Cardiovascular: Normal rate, regular rhythm and normal heart sounds.   Pulmonary/Chest: Effort normal and breath sounds normal. She has no wheezes. She has no rales. Right  breast exhibits no inverted nipple, no mass, no nipple discharge, no skin change and no tenderness. Left breast exhibits no inverted nipple, no mass, no nipple discharge, no skin change and no tenderness.  Lymphadenopathy:    She has no cervical adenopathy.    Data Reviewed BILATERAL BREAST MRI WITH AND WITHOUT CONTRAST  Technique: Multiplanar, multisequence MR images of both breasts  were obtained prior to and following the intravenous administration  of 28ml of Multihance.  THREE-DIMENSIONAL MR IMAGE RENDERING ON INDEPENDENT WORKSTATION:  Three-dimensional MR images were rendered by post-processing of  the original MR data on an independent DynaCad workstation. The  three-dimensional MR images were interpreted, and findings are  reported in the following complete MRI report for this study.  Comparison: Mammograms dated 12/11/2012, 12/28/2006 and ultrasound  dated 12/11/2012.  FINDINGS:  Breast composition: b: There are scattered areas of  fibroglandular density.  Background parenchymal enhancement: Mild  Right breast: No mass or abnormal enhancement.  Left breast: There is a mildly irregular enhancing mass located  within the posterior one third of the left breast at the 12 o'clock  position with central clip artifact corresponding to the recently  diagnosed invasive ductal carcinoma.This measures 1.9 x 1.6 x 1.5  cm in size. This is associated with plateau enhancement kinetics.  There are no additional worrisome enhancing foci within the left  breast.  Lymph nodes: There are no abnormal appearing axillary lymph nodes  and no evidence for internal mammary adenopathy.  Ancillary findings: None.  IMPRESSION:  1.9 cm irregular enhancing mass located within the left breast at  the 12 o'clock position corresponding to the recently diagnosed  invasive ductal carcinoma. No evidence for adenopathy and no  additional findings.    Assessment    Left breast cancer     Plan     Left breast lumpectomy, left axillary sentinel node biopsy   We discussed the staging and pathophysiology of breast cancer for 90 minutes. Her son and a friend were present.  Ms [name] was in a wheelchair where I had to examine her due to weakness from recent surgery.  She was teary much of the appt.  I think we could wait a little time to do this but she is adamant we do soon.  Additionally her son is currently here and would like to do now. We discussed all of the different options for treatment for breast cancer including surgery, chemotherapy, radiation therapy, Herceptin, and antiestrogen therapy.   We discussed a sentinel lymph node biopsy as she does not appear to having lymph node involvement right now. We discussed the performance of that with injection of radioactive tracer and blue dye. We discussed that she would have an incision underneath her axillary hairline. We discussed that there is a bout a 10-20% chance of having a positive  node with a sentinel lymph node biopsy and we will await the permanent pathology to make any other first further decisions in terms of her treatment. One of these options might be to return to the operating room to perform an axillary lymph node dissection. We discussed about a 1-2% risk lifetime of chronic shoulder pain as well as lymphedema associated with a sentinel lymph node biopsy.  We discussed the options for treatment of the breast cancer which included lumpectomy versus a mastectomy. We discussed the performance of the lumpectomy with a wire placement. We discussed a 10% chance of a positive margin requiring reexcision in the operating room. We also discussed that she may need radiation therapy or antiestrogen therapy or both if she undergoes lumpectomy. We discussed mastectomy and the postoperative care for that as well. We discussed that there is no difference in her survival whether she undergoes lumpectomy with radiation therapy or antiestrogen therapy  versus a mastectomy. There is a slight difference in the local recurrence rate being 3-5% with lumpectomy and about 1% with a mastectomy. We discussed the risks of operation including bleeding, infection, possible reoperation. She understands her further therapy will be based on what her stages at the time of her operation.          Gisella Alwine 01/02/2013, 5:42 PM

## 2013-01-11 NOTE — Preoperative (Signed)
Beta Blockers   Reason not to administer Beta Blockers:metoprolol 01/10/13 2000

## 2013-01-11 NOTE — Transfer of Care (Signed)
Immediate Anesthesia Transfer of Care Note  Patient: [name]  Procedure(s) Performed: Procedure(s): BREAST LUMPECTOMY WITH NEEDLE LOCALIZATION AND AXILLARY SENTINEL LYMPH NODE BX (Left)  Patient Location: PACU  Anesthesia Type:General  Level of Consciousness: awake, alert , oriented and patient cooperative  Airway & Oxygen Therapy: Patient Spontanous Breathing and Patient connected to nasal cannula oxygen  Post-op Assessment: Report given to PACU RN and Post -op Vital signs reviewed and stable  Post vital signs: Reviewed and stable  Complications: persistent nausea and vomiting

## 2013-01-11 NOTE — Interval H&P Note (Signed)
History and Physical Interval Note:  01/11/2013 9:28 AM  [name]  has presented today for surgery, with the diagnosis of breast cancer  The various methods of treatment have been discussed with the patient and family. After consideration of risks, benefits and other options for treatment, the patient has consented to  Procedure(s): BREAST LUMPECTOMY WITH NEEDLE LOCALIZATION AND AXILLARY SENTINEL LYMPH NODE BX (Left) as a surgical intervention .  The patient's history has been reviewed, patient examined, no change in status, stable for surgery.  I have reviewed the patient's chart and labs.  Questions were answered to the patient's satisfaction.     Christyn Gutkowski

## 2013-01-11 NOTE — Anesthesia Procedure Notes (Signed)
Procedure Name: Intubation Date/Time: 01/11/2013 10:16 AM Performed by: Leona Singleton A Pre-anesthesia Checklist: Patient identified, Emergency Drugs available, Suction available and Patient being monitored Patient Re-evaluated:Patient Re-evaluated prior to inductionOxygen Delivery Method: Circle system utilized Preoxygenation: Pre-oxygenation with 100% oxygen Intubation Type: IV induction Ventilation: Mask ventilation without difficulty Laryngoscope Size: Miller and 2 Grade View: Grade I Tube type: Oral Tube size: 7.5 mm Number of attempts: 1 Airway Equipment and Method: Stylet Placement Confirmation: ETT inserted through vocal cords under direct vision,  positive ETCO2 and breath sounds checked- equal and bilateral Secured at: 21 cm Tube secured with: Tape Dental Injury: Teeth and Oropharynx as per pre-operative assessment

## 2013-01-12 ENCOUNTER — Encounter (HOSPITAL_COMMUNITY): Payer: Self-pay | Admitting: General Surgery

## 2013-01-12 MED ORDER — PANTOPRAZOLE SODIUM 40 MG PO TBEC: 40.0000 mg | DELAYED_RELEASE_TABLET | Freq: Every day | ORAL | Status: DC

## 2013-01-12 MED ORDER — PHENOL 1.4 % MT LIQD
1.0000 | OROMUCOSAL | Status: DC | PRN
  Filled 2013-01-12: qty 177

## 2013-01-12 MED ORDER — ENOXAPARIN SODIUM 40 MG/0.4ML ~~LOC~~ SOLN
40.0000 mg | SUBCUTANEOUS | Status: DC
  Administered 2013-01-12: 40 mg via SUBCUTANEOUS
  Filled 2013-01-12: qty 0.4

## 2013-01-12 MED ORDER — HYDROCODONE-ACETAMINOPHEN 10-325 MG PO TABS: 1.0000 | ORAL_TABLET | Freq: Four times a day (QID) | ORAL | Status: DC | PRN

## 2013-01-12 NOTE — Progress Notes (Signed)
Discharge instruction given to patient to bring to [facility]. No questions verbalized.

## 2013-01-12 NOTE — Clinical Social Work Note (Addendum)
Clinical Social Work Department BRIEF PSYCHOSOCIAL ASSESSMENT 01/12/2013  Patient:  [name], [name]     Account Number:  [id number]     Admit date:  01/11/2013  Clinical Social Worker:  Hulan Fray  Date/Time:  01/12/2013 02:07 PM  Referred by:  Care Management  Date Referred:  01/12/2013 Referred for  SNF Placement   Other Referral:   Interview type:  Patient Other interview type:   Son- Dr. Ether Griffins at bedside    PSYCHOSOCIAL DATA Living Status:  ALONE Admitted from facility:   Level of care:   Primary support name:  Dr. Jeri Modena Primary support relationship to patient:  CHILD, ADULT Degree of support available:   supportive    CURRENT CONCERNS Current Concerns  Post-Acute Placement   Other Concerns:    SOCIAL WORK ASSESSMENT / PLAN Clinical Social Worker received notification that patient will need SNF placement. CSW was informed tthat patient does not meet medicare guidelines and will be private pay with SNF placement. CSW introduced self and explained reason for visit. Son reported that he has already reached out to [facility] in [place] and was informed that they had availability. CSW left a voice message with admissions coordinator at facility regarding if they did have availability for potential discharge today and awaiting return call.    CSW completed FL2 for MD to sign.   Assessment/plan status:  Psychosocial Support/Ongoing Assessment of Needs Other assessment/ plan:   Information/referral to community resources:   Son already had contacted facility.    PATIENT'S/FAMILY'S RESPONSE TO PLAN OF CARE: Son reported that he had already made contact with [facility] and understands that she will be private pay.     14:35pm CSW spoke with son and he reported that he is deciding between ambulance or son transporting patient. CSW informed son, that it's not a guarantee insurance will pay for ambulance. CSW offered information for [facility]  transport and son declined.   15:36pm CSW decided to transport patient to facility. CSW completed discharge packet and will be given to son. CSW will sign off, as social work intervention is no longer needed.

## 2013-01-12 NOTE — Discharge Summary (Signed)
Physician Discharge Summary  Patient ID: [name] MRN: [id number] DOB/AGE: [birth year] 72 y.o.  Admit date: 01/11/2013 Discharge date: 01/12/2013  Admission Diagnoses: Left breast cancer  Discharge Diagnoses:  Active Problems:   * No active hospital problems. *   Discharged Condition: good  Hospital Course: 40 yof with multiple comorbidities who underwent left lumpectomy/sentinel node biopsy for breast cancer. She has already undergone hysterectomy recently and is debilitated from that.  She has done well with surgery but weakness and overall condition is not going to allow her for discharge home.  She has been seen by therapies and recommended for snf.  Consults: pt/ot/case management  Significant Diagnostic Studies: none  Treatments: left breast lumpectomy/left axillary sentinel node biopsy    Disposition:    Future Appointments Provider Department Dept Phone   01/24/2013 1:30 PM Emelia Loron, MD [facility], [place] [phone]   02/09/2013 1:30 PM Myeong Eyvonne Left [facility] [phone]       Medication List    STOP taking these medications       acetaminophen-codeine 300-30 MG per tablet  Commonly known as:  TYLENOL #3      TAKE these medications       buPROPion 150 MG 24 hr tablet  Commonly known as:  WELLBUTRIN XL  Take 150 mg by mouth every morning.     CALTRATE 600+D 600-400 MG-UNIT per tablet  Generic drug:  Calcium Carbonate-Vitamin D  Take 1 tablet by mouth daily.     carisoprodol 350 MG tablet  Commonly known as:  SOMA  Take 350 mg by mouth 2 (two) times daily.     diclofenac 75 MG EC tablet  Commonly known as:  VOLTAREN  Take 75 mg by mouth 2 (two) times daily.     FIBER SELECT GUMMIES PO  Take 2 tablets by mouth every morning.     HYDROcodone-acetaminophen 10-325 MG per tablet  Commonly known as:  NORCO  Take 1-2 tablets by mouth every 6 (six) hours as needed.     HYDROcodone-acetaminophen 10-325 MG per  tablet  Commonly known as:  NORCO  Take 1 tablet by mouth every 6 (six) hours as needed for pain.     metoprolol succinate 50 MG 24 hr tablet  Commonly known as:  TOPROL-XL  Take 50 mg by mouth 2 (two) times daily.     multivitamin with minerals Tabs tablet  Take 1 tablet by mouth daily.     PENNSAID 1.5 % Soln  Generic drug:  Diclofenac Sodium  Place 1 Bottle onto the skin daily as needed. For pain     polyethylene glycol packet  Commonly known as:  MIRALAX / GLYCOLAX  Take 17 g by mouth daily.     RESTASIS 0.05 % ophthalmic emulsion  Generic drug:  cycloSPORINE  Place 1 drop into both eyes 2 (two) times daily.     senna-docusate 8.6-50 MG per tablet  Commonly known as:  Senokot-S  Take 1 tablet by mouth daily.     simvastatin 20 MG tablet  Commonly known as:  ZOCOR  Take 20 mg by mouth daily.           Follow-up Information   Follow up with [facility], MD In 3 weeks.   Specialty:  General Surgery   Contact information:   [address] [place] [address] [phone]       Signed: Emelia Loron 01/12/2013, 2:06 PM

## 2013-01-12 NOTE — Clinical Social Work Placement (Addendum)
Clinical Social Work Department CLINICAL SOCIAL WORK PLACEMENT NOTE 01/12/2013  Patient:  [name], [name]  Account Number:  [id number] Admit date:  01/11/2013  Clinical Social Worker:  Hulan Fray  Date/time:  01/12/2013 02:18 PM  Clinical Social Work is seeking post-discharge placement for this patient at the following level of care:   SKILLED NURSING   (*CSW will update this form in Epic as items are completed)   01/12/2013  Patient/family provided with [facility] Department of Clinical Social Work's list of facilities offering this level of care within the geographic area requested by the patient (or if unable, by the patient's family).  01/12/2013  Patient/family informed of their freedom to choose among providers that offer the needed level of care, that participate in Medicare, Medicaid or managed care program needed by the patient, have an available bed and are willing to accept the patient.  01/12/2013  Patient/family informed of [facility]' ownership interest in [facility], as well as of the fact that they are under no obligation to receive care at this facility.  PASARR submitted to [employer] on 12/18/2008 PASARR number received from [employer] on 12/18/2008  FL2 transmitted to all facilities in geographic area requested by pt/family on  01/12/2013 FL2 transmitted to all facilities within larger geographic area on   Patient informed that his/her managed care company has contracts with or will negotiate with  certain facilities, including the following:     Patient/family informed of bed offers received:  01/12/2013 Patient chooses bed at [facility], [place] Physician recommends and patient chooses bed at    [facility] [place] on  01/12/2013 Patient to be transferred to facility by Private vehicle  The following physician request were entered in Epic:   Additional Comments:

## 2013-01-12 NOTE — Care Management Note (Signed)
  Page 1 of 1   01/12/2013     3:21:56 PM   CARE MANAGEMENT NOTE 01/12/2013  Patient:  [name], [name]   Account Number:  [id number]  Date Initiated:  01/12/2013  Documentation initiated by:  Ronny Flurry  Subjective/Objective Assessment:     Action/Plan:   Anticipated DC Date:  01/12/2013   Anticipated DC Plan:  SKILLED NURSING FACILITY  In-house referral  Clinical Social Worker         Choice offered to / List presented to:  C-1 Patient           Status of service:   Medicare Important Message given?   (If response is "NO", the following Medicare IM given date fields will be blank) Date Medicare IM given:   Date Additional Medicare IM given:    Discharge Disposition:    Per UR Regulation:    If discussed at Long Length of Stay Meetings, dates discussed:    Comments:  01-12-13 Patient and son both wanting patient to have short term rehab at discharge.  Explained patient is observation status , does not meet inpatient criteria, there, will not have inpatient 3 midnight  qualifing stay for SNF .  Provided home health list and private duty agency list . Patient and son both want patient to go to SNF knowing it will be private pay .  Ronny Flurry RN BSN [phone]

## 2013-01-12 NOTE — Progress Notes (Signed)
1 Day Post-Op  Subjective: Feels ok, voiding, tol some diet, pain controlled  Objective: Vital signs in last 24 hours: Temp:  [97.3 F (36.3 C)-98.9 F (37.2 C)] 98.9 F (37.2 C) (08/22 0630) Pulse Rate:  [83-104] 104 (08/22 0630) Resp:  [14-24] 18 (08/22 0630) BP: (107-167)/(49-90) 107/49 mmHg (08/22 0630) SpO2:  [93 %-100 %] 93 % (08/22 0630) Weight:  [315 lb (142.883 kg)] 315 lb (142.883 kg) (08/21 1900) Last BM Date: 01/11/13  Intake/Output from previous day: 08/21 0701 - 08/22 0700 In: 2545 [P.O.:720; I.V.:1825] Out: 2100 [Urine:1300; Blood:50] Intake/Output this shift:    General appearance: no distress Incision/Wound:breast incision clean without infection, axillary dressing clean LE edema stable  Lab Results:   Recent Labs  01/11/13 1422  WBC 6.4  HGB 11.7*  HCT 35.9*  PLT 200   BMET  Recent Labs  01/11/13 1422  CREATININE 0.95   PT/INR  Studies/Results: Nm Sentinel Node Inj-no Rpt (breast)  01/11/2013   CLINICAL DATA: left axillary snbx   Sulfur colloid was injected intradermally by the nuclear medicine  technologist for breast cancer sentinel node localization.    [place] [employer] Breast Loc Dev   1st Lesion  Inc US Guide  01/11/2013   *RADIOLOGY REPORT*  Clinical Data:  Patient presents for ultrasound-guided needle localization of a biopsy-proven malignancy over the 12 o'clock position of the left breast prior to surgical excision.  NEEDLE LOCALIZATION USING ULTRASOUND GUIDANCE AND SPECIMEN RADIOGRAPH  Comparison: Previous exams.  Patient presents for needle localization prior to surgical excision.  I met with the patient and we discussed the procedure of needle localization including benefits and alternatives. We discussed the high likelihood of a successful procedure. We discussed the risks of the procedure, including infection, bleeding, tissue injury, and further surgery. Informed, written consent was given. The usual time-out protocol was performed  immediately prior to the procedure.  Using ultrasound guidance, sterile technique, 2% lidocaine and a 7 cm ultrawire needle device, the targeted mass/clip at the 12 o'clock position was localized using a medial to lateral approach. The films are marked for Dr. Dwain Sarna.  Specimen radiograph is performed at , and confirms the targeted mass/clip and wire present in the tissue sample.  The specimen is marked for pathology.  IMPRESSION: Needle localization left breast mass/clip breast.  No apparent complications.   Original Report Authenticated By: Elberta Fortis, M.D.    Anti-infectives: Anti-infectives   Start     Dose/Rate Route Frequency Ordered Stop   01/11/13 0600  ceFAZolin (ANCEF) 3 g in dextrose 5 % 50 mL IVPB     3 g 160 mL/hr over 30 Minutes Intravenous On call to O.R. 01/10/13 1300 01/11/13 1023      Assessment/Plan: POD 1 left breast lumpectomy/sentinel node biopsy 1. Po pain meds 2. Lovenox/scds 3. Needs pulm toilet with ics 4. oob today 5. Pt and ot evaluation.  She is debilitated from recent hysterectomy plus this surgery.  This surgery is normally discharged today but I don't know with her social situation and recent surgeries if this is possible.  She put herself in a snf from last surgery.  Will get pt/ot evals and case management input prior to making any decisions  With this difficult situation.  [facility] 01/12/2013

## 2013-01-12 NOTE — Evaluation (Signed)
Occupational Therapy Evaluation Patient Details Name: [name] MRN: [id number] DOB: 1940-09-25 Today's Date: 01/12/2013 Time: 4696-2952 OT Time Calculation (min): 43 min  OT Assessment / Plan / Recommendation History of present illness 40 yof who recently underwent what sounds like tah/bso for endometrial cancer at [facility].  She has had a lot of difficulty after that surgery in terms of weakness although overall she sounds like she is recovering pretty well.  She lives alone and this has caused some difficulty as she required a snf for short term.  She and her son are very concerned about that with respect to another operation.  She thought she felt a left breast mass on exam (neither of [place] can identify this today) and went to get mm.  She had no other breast symptoms.  She has no prior breast history and no family history.  She had mm/us and then mr.  The mr results are listed below and there is a 1.9x1.6x1.5 cm mass at 12 o'clock in the left breast.  There are no abnormalities on the right and the nodes are all normal appearing.  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  She comes in today to discuss options.   Clinical Impression   Pt presents to OT with above diagnosis.  She demonstrates generalized weakness/ decreased activity tolerance and decreased independence with BADLs.  Currently, she requires min -mod A for BADLs, and has lives alone at home.  She will need continued rehab to allow her to achieve modified independent level to return home a modified independence. Recommend SNF     OT Assessment  Patient needs continued OT Services    Follow Up Recommendations  SNF    Barriers to Discharge Decreased caregiver support    Equipment Recommendations  None recommended by OT    Recommendations for Other Services    Frequency  Min 2X/week    Precautions / Restrictions Precautions Precautions:  Fall Precaution Comments: no lifting >15lbs with Lt. UE.   Restrictions Weight Bearing Restrictions: No   Pertinent Vitals/Pain     ADL  Eating/Feeding: Independent Where Assessed - Eating/Feeding: Chair Grooming: Wash/dry hands;Min guard Where Assessed - Grooming: Unsupported standing Upper Body Bathing: Minimal assistance Where Assessed - Upper Body Bathing: Unsupported sitting Lower Body Bathing: Moderate assistance Where Assessed - Lower Body Bathing: Supported sit to stand Upper Body Dressing: Minimal assistance Where Assessed - Upper Body Dressing: Unsupported sitting Lower Body Dressing: Maximal assistance Where Assessed - Lower Body Dressing: Supported sit to stand Toilet Transfer: Minimal assistance Toilet Transfer Method: Sit to stand;Stand pivot Toilet Transfer Equipment: Comfort height toilet;Grab bars Toileting - Clothing Manipulation and Hygiene: Moderate assistance Where Assessed - Toileting Clothing Manipulation and Hygiene: Standing Equipment Used: Reacher Transfers/Ambulation Related to ADLs: Pt requires min guard assist with RW ADL Comments: Pt reports she used a sock aid and wore slip on shoes PTA.  Pt moves slowly and demonstrates dyspnea 3/4 with activity.  Requires 1 seated rest break during OT session.  Pt fatigued at end of session     OT Diagnosis: Generalized weakness;Acute pain  OT Problem List: Decreased strength;Decreased activity tolerance;Pain;Impaired UE functional use;Obesity;Decreased knowledge of precautions OT Treatment Interventions: Self-care/ADL training;DME and/or AE instruction;Therapeutic activities;Patient/family education   OT Goals(Current goals can be found in the care plan section) Acute Rehab OT Goals Patient Stated Goal: To get stronger, and regain independence OT Goal Formulation: With patient/family  Time For Goal Achievement: 01/19/13 Potential to Achieve Goals: Good ADL Goals Pt Will Perform Grooming: with modified  independence;standing Pt Will Perform Upper Body Bathing: with modified independence;sitting Pt Will Perform Lower Body Bathing: with modified independence;with adaptive equipment;sit to/from stand Pt Will Perform Upper Body Dressing: with modified independence;sitting Pt Will Perform Lower Body Dressing: with modified independence;sit to/from stand;with adaptive equipment Pt Will Transfer to Toilet: with modified independence;ambulating;bedside commode;grab bars Pt Will Perform Toileting - Clothing Manipulation and hygiene: with modified independence;sit to/from stand Additional ADL Goal #1: Pt will be modified independent with bed mobility in prep for BADLs  Visit Information  Last OT Received On: 01/12/13 Assistance Needed: +1 History of Present Illness: 45 yof who recently underwent what sounds like tah/bso for endometrial cancer at [facility].  She has had a lot of difficulty after that surgery in terms of weakness although overall she sounds like she is recovering pretty well.  She lives alone and this has caused some difficulty as she required a snf for short term.  She and her son are very concerned about that with respect to another operation.  She thought she felt a left breast mass on exam (neither of [place] can identify this today) and went to get mm.  She had no other breast symptoms.  She has no prior breast history and no family history.  She had mm/us and then mr.  The mr results are listed below and there is a 1.9x1.6x1.5 cm mass at 12 o'clock in the left breast.  There are no abnormalities on the right and the nodes are all normal appearing.  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  She comes in today to discuss options.       Prior Functioning     Home Living Family/patient expects to be discharged to:: Skilled nursing facility Living Arrangements: Alone Prior Function Level of Independence:  Independent with assistive device(s) Comments: will not have access to help after D/C, One level home with ramp or 2 step entrance, shower with grab bars, raised toilet and DME except 3 in 1 Communication Communication: No difficulties         Vision/Perception     Cognition  Cognition Arousal/Alertness: Awake/alert Behavior During Therapy: WFL for tasks assessed/performed Overall Cognitive Status: Within Functional Limits for tasks assessed    Extremity/Trunk Assessment Upper Extremity Assessment Upper Extremity Assessment: RUE deficits/detail;LUE deficits/detail RUE Deficits / Details: shoulder ~90* due to arthritic changes.   RUE Coordination: decreased gross motor LUE Deficits / Details: pain 4-5/10 with Lt. UE movement Lower Extremity Assessment Lower Extremity Assessment: Defer to PT evaluation     Mobility Bed Mobility Bed Mobility: Not assessed Supine to Sit: 4: Min assist;HOB flat;Other (comment) (up via R elbow) Sitting - Scoot to Edge of Bed: 5: Supervision Sit to Supine: 4: Min assist;HOB flat Details for Bed Mobility Assistance: vc's for technique; minimal assist at trunk or legs Transfers Transfers: Sit to Stand;Stand to Sit Sit to Stand: 4: Min assist;With upper extremity assist;From bed;From toilet Stand to Sit: 4: Min assist;With upper extremity assist;To chair/3-in-1;To toilet Details for Transfer Assistance: Min A to lift buttocks.  Utilizes momentum to move sit to stand     Exercise     Balance Balance Balance Assessed: Yes Static Standing Balance Static Standing - Balance Support: Right upper extremity supported;Left upper extremity supported Static Standing - Level of Assistance: 4: Min assist Dynamic  Standing Balance Dynamic Standing - Balance Support: No upper extremity supported Dynamic Standing - Level of Assistance: Other (comment) (min guard assist) Dynamic Standing - Balance Activities: Other (comment) (washing hands at sink )   End of  Session OT - End of Session Equipment Utilized During Treatment: Rolling walker (breast binder) Activity Tolerance: Patient limited by fatigue Patient left: in chair;with call bell/phone within reach;with family/visitor present Nurse Communication: Mobility status;Other (comment) (binder causing irritation)  GO     Nikita Humble M 01/12/2013, 1:16 PM

## 2013-01-12 NOTE — Evaluation (Signed)
Physical Therapy Evaluation Patient Details Name: [name] MRN: [id number] DOB: July 14, 1940 Today's Date: 01/12/2013 Time: 7829-5621 PT Time Calculation (min): 56 min  PT Assessment / Plan / Recommendation History of Present Illness  39 yof who recently underwent what sounds like tah/bso for endometrial cancer at [facility].  She has had a lot of difficulty after that surgery in terms of weakness although overall she sounds like she is recovering pretty well.  She lives alone and this has caused some difficulty as she required a snf for short term.  She and her son are very concerned about that with respect to another operation.  She thought she felt a left breast mass on exam (neither of [place] can identify this today) and went to get mm.  She had no other breast symptoms.  She has no prior breast history and no family history.  She had mm/us and then mr.  The mr results are listed below and there is a 1.9x1.6x1.5 cm mass at 12 o'clock in the left breast.  There are no abnormalities on the right and the nodes are all normal appearing.  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  She comes in today to discuss options.  Clinical Impression  Pt admitted for lumpectomy and needle BX. Pt currently with functional limitations due to the deficits listed below (see PT Problem List).  Pt will benefit from skilled PT to increase their independence and safety with mobility to allow discharge to the venue listed below.       PT Assessment  Patient needs continued PT services    Follow Up Recommendations  SNF    Does the patient have the potential to tolerate intense rehabilitation      Barriers to Discharge        Equipment Recommendations   (3 in 1)    Recommendations for Other Services     Frequency Min 3X/week    Precautions / Restrictions Precautions Precautions: Fall;Other (comment) (surgical  prec--UE) Restrictions Weight Bearing Restrictions: No   Pertinent Vitals/Pain Some arthritic pain      Mobility  Bed Mobility Bed Mobility: Supine to Sit;Sitting - Scoot to Edge of Bed;Sit to Supine (bridging) Supine to Sit: 4: Min assist;HOB flat;Other (comment) (up via R elbow) Sitting - Scoot to Edge of Bed: 5: Supervision Sit to Supine: 4: Min assist;HOB flat Details for Bed Mobility Assistance: vc's for technique; minimal assist at trunk or legs Transfers Transfers: Sit to Stand;Stand to Sit Sit to Stand: 4: Min assist;With upper extremity assist;From bed Stand to Sit: 4: Min guard;With upper extremity assist;To bed;To chair/3-in-1 Details for Transfer Assistance: vc's for technique, hand placement; minor assist to help her forward Ambulation/Gait Ambulation/Gait Assistance: 4: Min assist Ambulation Distance (Feet): 10 Feet (time 2) Assistive device:  (iv pole) Ambulation/Gait Assistance Details: mildly unsteady with wide BOS and excessive w/shift L/R Gait Pattern: Step-through pattern;Decreased step length - right;Decreased step length - left;Lateral trunk lean to right;Lateral trunk lean to left;Wide base of support Stairs: No    Exercises     PT Diagnosis:    PT Problem List: Decreased strength;Decreased activity tolerance;Decreased mobility;Decreased knowledge of use of DME;Pain PT Treatment Interventions:       PT Goals(Current goals can be found in the care plan section) Acute Rehab PT Goals Patient Stated Goal: Home as soon as possible PT Goal Formulation: With patient Time For Goal Achievement: 01/19/13 Potential  to Achieve Goals: Good  Visit Information  Last PT Received On: 01/12/13 Assistance Needed: +1 History of Present Illness: 89 yof who recently underwent what sounds like tah/bso for endometrial cancer at [facility].  She has had a lot of difficulty after that surgery in terms of weakness although overall she sounds like she is recovering pretty well.   She lives alone and this has caused some difficulty as she required a snf for short term.  She and her son are very concerned about that with respect to another operation.  She thought she felt a left breast mass on exam (neither of [place] can identify this today) and went to get mm.  She had no other breast symptoms.  She has no prior breast history and no family history.  She had mm/us and then mr.  The mr results are listed below and there is a 1.9x1.6x1.5 cm mass at 12 o'clock in the left breast.  There are no abnormalities on the right and the nodes are all normal appearing.  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  She comes in today to discuss options.       Prior Functioning  Home Living Family/patient expects to be discharged to:: Skilled nursing facility Living Arrangements: Alone Prior Function Level of Independence: Independent with assistive device(s) Comments: will not have access to help after D/C, One level home with ramp or 2 step entrance, shower with grab bars, raised toilet and DME except 3 in 1 Communication Communication: No difficulties    Cognition  Cognition Arousal/Alertness: Awake/alert Behavior During Therapy: WFL for tasks assessed/performed Overall Cognitive Status: Within Functional Limits for tasks assessed    Extremity/Trunk Assessment Upper Extremity Assessment Upper Extremity Assessment: Defer to OT evaluation Lower Extremity Assessment Lower Extremity Assessment: Generalized weakness;Overall [facility] for tasks assessed   Balance Balance Balance Assessed: Yes Static Standing Balance Static Standing - Balance Support: Right upper extremity supported;Left upper extremity supported Static Standing - Level of Assistance: 4: Min assist  End of Session PT - End of Session Activity Tolerance: Patient tolerated treatment well Patient left: in chair;with family/visitor present Nurse  Communication: Mobility status  GP     Marx Doig, Eliseo Gum 01/12/2013, 12:01 PM 01/12/2013  [place] Bing, PT [phone]  (pager)

## 2013-01-16 ENCOUNTER — Telehealth (INDEPENDENT_AMBULATORY_CARE_PROVIDER_SITE_OTHER): Payer: Self-pay | Admitting: *Deleted

## 2013-01-16 NOTE — Telephone Encounter (Signed)
Her path is not back.  I will call patient when path is back.

## 2013-01-16 NOTE — Telephone Encounter (Signed)
Dr. Jeri Modena called to ask about path results.  He stated he will be on his mobile for today which is [phone].  Explained that a message will be sent to Renee Harder MD to make them aware of his contact information.

## 2013-01-17 NOTE — Telephone Encounter (Signed)
Dr. Ether Griffins has called back asking for a return call today.  Explained that Dr. Dwain Sarna is unavailable today.  Dr. Ether Griffins states he wants a call back from his nurse today then.  He left the number of [phone].  He is addiment that he must receive a call today.

## 2013-01-17 NOTE — Telephone Encounter (Signed)
Returned Dr Irene Limbo call. Dr Ether Griffins wanted to know about the pt's pathology report and who would be following this since Dr Dwain Sarna was not in the office today. I explained to Dr Ether Griffins that the path is not finalized yet in epic but as soon as it gets finalized the pt would receive a call from Dr Dwain Sarna b/c he always calls his pt's with the path report unless if he gives me a message to call the pt. Dr Ether Griffins just wants to make sure this is being followed by someone.

## 2013-01-18 NOTE — Telephone Encounter (Signed)
Dr Ether Griffins called back today.  He is upset he hasn't heard about the pathology results a week postop.  I advised I will page Dr Dwain Sarna to call.

## 2013-01-22 NOTE — Progress Notes (Signed)
Occupational Therapy Eval Addendum 02-07-13   02-07-13 1300  OT G-codes **NOT FOR INPATIENT CLASS**  Functional Limitation Self care  Self Care Current Status [id number]) CK  Self Care Goal Status (U0454) CI  Self Care Discharge Status [phone]) Trinna Balloon, OTR/L [phone]

## 2013-01-22 NOTE — Progress Notes (Signed)
Late Entry---PT Evaluation Addendum  01/12/13 1146  PT Time Calculation  PT Start Time 1040  PT Stop Time 1136  PT Time Calculation (min) 56 min  PT G-Codes **NOT FOR INPATIENT CLASS**  Functional Assessment Tool Used clinical judgement  Functional Limitation Mobility: Walking and moving around  Mobility: Walking and Moving Around Current Status (Z6109) CI  Mobility: Walking and Moving Around Goal Status (U0454) CI  Mobility: Walking and Moving Around Discharge Status (U9811) CI  PT General Charges  $$ ACUTE PT VISIT 1 Procedure  PT Evaluation  $Initial PT Evaluation Tier I 1 Procedure  PT Treatments  $Gait Training 8-22 mins  $Therapeutic Activity 23-37 mins  01/22/2013  [place] Bing, PT [phone]  (pager)

## 2013-01-22 NOTE — Progress Notes (Deleted)
Late Entry--PT Evaluation Addendum

## 2013-01-24 ENCOUNTER — Ambulatory Visit (INDEPENDENT_AMBULATORY_CARE_PROVIDER_SITE_OTHER): Payer: Medicare Other | Admitting: General Surgery

## 2013-01-24 ENCOUNTER — Other Ambulatory Visit (INDEPENDENT_AMBULATORY_CARE_PROVIDER_SITE_OTHER): Payer: Self-pay | Admitting: General Surgery

## 2013-01-24 ENCOUNTER — Encounter (INDEPENDENT_AMBULATORY_CARE_PROVIDER_SITE_OTHER): Payer: Self-pay | Admitting: General Surgery

## 2013-01-24 VITALS — BP 142/82 | HR 80 | Resp 18 | Ht 67.0 in | Wt 321.6 lb

## 2013-01-24 DIAGNOSIS — C50412 Malignant neoplasm of upper-outer quadrant of left female breast: Secondary | ICD-10-CM | POA: Insufficient documentation

## 2013-01-24 DIAGNOSIS — Z09 Encounter for follow-up examination after completed treatment for conditions other than malignant neoplasm: Secondary | ICD-10-CM

## 2013-01-24 NOTE — Addendum Note (Signed)
Addended by: Ethlyn Gallery on: 01/24/2013 02:24 PM   Modules accepted: Orders

## 2013-01-24 NOTE — Patient Instructions (Signed)
      ABC CLASS After Breast Cancer Class  After Breast Cancer Class is a specially designed exercise class to assist you in a safe recovery after having breast cancer surgery.  In this class you will learn how to get back to full function whether your drains were just removed or if you had surgery a month ago.  This one-time class is held the 1st and 3rd Monday of every month from 11:00 a.m. until 12:00 noon at the [facility] located at [address]  This class is FREE and space is limited.  For more information or to register for the next available class, call [phone].  Class Goals   Understand specific stretches to improve the flexibility of your chest and shoulder.   Learn ways to safely strengthen your upper body and improve your posture.   Understand the warning signs of infection and why you may be at risk for an arm infection.   Learn about Lymphedema and prevention.  **You do not attend this class until after surgery.  Drains must be removed to participate.     Donna Salisbury, PT, CLT Marti Smith, PT, CLT        

## 2013-01-24 NOTE — Progress Notes (Signed)
Subjective:     Patient ID: [name], female   DOB: 1941-05-18, 72 y.o.   MRN: [id number]  HPI 89 yof s/p left breast lumpectomy/snbx on 01/11/13.  Her pathology is a 1.9 cm invasive ductal carcinoma with negative margins. Closest margin is 1 cm.  This was er/pr pos and her2 negative.  Her Ki was 17%.  There were two sentinel nodes that were negative.  She has some bilateral ankle swelling that is improving and was present before surgery.  She has some pain at incision site but is otherwise doing well.  Review of Systems     Objective:   Physical Exam  Pulmonary/Chest:         Assessment:     Stage I left breast cancer     Plan:     I gave her exercises to begin.  Will make appts with med onc and rad onc for follow up.  I will see her back in about 3 months. Discussed path and need for further therapy.  Gave her rx for bras today also

## 2013-01-25 ENCOUNTER — Telehealth: Payer: Self-pay | Admitting: *Deleted

## 2013-01-25 NOTE — Telephone Encounter (Signed)
Confirmed 02/05/13 appt w/ pt.  Mailed before appt letter & packet to pt.  Emailed Alisha at [facility] to make her aware.  Took paperwork to Med Rec for chart.

## 2013-01-26 ENCOUNTER — Encounter: Payer: Self-pay | Admitting: Radiation Oncology

## 2013-01-26 NOTE — Progress Notes (Signed)
Location of Breast Cancer:Left Breast  12 0' clock position   Histology per Pathology ReportBreast,12/11/12: left, needle core biopsy, 12 o'clock, 15cm/nipple - INVASIVE DUCTAL CARCINOMA WITH ABUNDANT MUCIN. Receptor Status: ER(+), PR (+), Her2-neu (-)  Did patient present with symptoms (if so, please note symptoms) or was this found on screening mammography?: patient found herself thought she felt a mass,went for mammogram,/U/s and MRI,Mri+  Past/Anticipated interventions by surgeon, if any: 8/21/141. Breast, lumpectomy, Left- INVASIVE DUCTAL CARCINOMA, 1.9 CM.- MARGINS NOT INVOLVED.- FIBROCYSTIC CHANGES.2. Breast, excision, Left additional posterior margin- BENIGN FIBROADIPOSE TISSUE.- NO EVIDENCE OF MALIGNANCY.3. Lymph node, sentinel, biopsy, Left breast- ONE BENIGN LYMPH NODE (0/1).  Past/Anticipated interventions by medical oncology, if any: Chemotherapy 1st  Scheduled appt with Dr.Khan 02/05/13  Lymphedema issues, if any:  no Pain issues, if any: right shoulder  And right knee , left  Breast tingles every now and then annoying pain, incisions well healed with glue still on SAFETY ISSUES:  Prior radiation? no  Pacemaker/ICD? no  Possible current pregnancy?no  Is the patient on methotrexate? no  Current Complaints / other details: Recent Hysterectomy ( TAH/BSO for endometrial cancer)at [facility] before Breast cancer found   Non smoker, no alcohol use,  Lowella Petties, RN 01/26/2013,11:22 AM

## 2013-01-29 ENCOUNTER — Ambulatory Visit
Admission: RE | Admit: 2013-01-29 | Discharge: 2013-01-29 | Disposition: A | Discharge status: Home / Self Care | Payer: Medicare Other | Source: Ambulatory Visit | Attending: Radiation Oncology | Admitting: Radiation Oncology

## 2013-01-29 ENCOUNTER — Encounter: Payer: Self-pay | Admitting: Radiation Oncology

## 2013-01-29 ENCOUNTER — Encounter: Payer: Self-pay | Admitting: *Deleted

## 2013-01-29 VITALS — BP 131/74 | HR 88 | Temp 97.5°F | Resp 20 | Ht 67.0 in | Wt 323.9 lb

## 2013-01-29 DIAGNOSIS — C50412 Malignant neoplasm of upper-outer quadrant of left female breast: Secondary | ICD-10-CM

## 2013-01-29 DIAGNOSIS — Z79899 Other long term (current) drug therapy: Secondary | ICD-10-CM | POA: Insufficient documentation

## 2013-01-29 DIAGNOSIS — E785 Hyperlipidemia, unspecified: Secondary | ICD-10-CM | POA: Insufficient documentation

## 2013-01-29 DIAGNOSIS — I10 Essential (primary) hypertension: Secondary | ICD-10-CM | POA: Insufficient documentation

## 2013-01-29 DIAGNOSIS — C50419 Malignant neoplasm of upper-outer quadrant of unspecified female breast: Secondary | ICD-10-CM | POA: Insufficient documentation

## 2013-01-29 DIAGNOSIS — M810 Age-related osteoporosis without current pathological fracture: Secondary | ICD-10-CM | POA: Insufficient documentation

## 2013-01-29 HISTORY — DX: Allergy, unspecified, initial encounter: T78.40XA

## 2013-01-29 NOTE — Progress Notes (Signed)
[facility] Psychosocial Distress Screening Clinical Social Work  Clinical Social Work was referred by distress screening protocol.  The patient scored a 10 on the Psychosocial Distress Thermometer which indicates severe distress. Clinical Social Worker met with pt in the exam room to assess for distress and other psychosocial needs.  Pt was diagnosed with breast cancer after having a hysterectomy.  Pt stated the combination of the procedure and diagnosis has caused her increased emotional concerns and issues adjusting to her diagnosis.  CSW validated the pt's feelings and offered additional support.  CSw informed pt of the support team and support services at [facility].  CSW provided pt with a Journey book; which had information and answered pt's question on bras and other breast cancer resources.  CSW also connected pt with the breast center navigator, and encouraged pt to call with any additional questions or concerns.       Tamala Julian, MSW, LCSW Clinical Social Worker [facility] [phone]

## 2013-01-29 NOTE — Progress Notes (Signed)
Please see the Nurse Progress Note in the MD Initial Consult Encounter for this patient. 

## 2013-01-30 NOTE — Progress Notes (Signed)
Radiation Oncology         [phone] ________________________________  Name: [name] MRN: [id number]  Date: 01/29/2013  DOB: 08/26/1940  WU:[id number],NWGNFAOZ R., MD  Emelia Loron, MD     REFERRING PHYSICIAN: Emelia Loron, MD   DIAGNOSIS: The encounter diagnosis was Breast cancer of upper-outer quadrant of left female breast.   HISTORY OF PRESENT ILLNESS::[name] is a 72 y.o. female who is seen for an initial consultation visit. The patient has had multiple medical issues recently. By report the patient proceeded with a hysterectomy/BSO for endometrial cancer recently at [facility]. The patient did have some difficulty recovering from this and was in a skilled nursing facility for a short period of time. She then thought that she felt a possible mass within the left breast and she sought further evaluation for this. The patient proceeded with a mammogram and ultrasound which corresponded to suspicious findings.a core biopsy was obtained and this returned positive for invasive ductal carcinoma. Receptor studies have indicated that the tumor is ER positive, PR positive, and HER-2/neu negative. She then proceeded to undergo an MRI scan of the breasts bilaterally on 12/26/2012. This revealed a 1.9 cm irregular enhancing mass within the left breast at the 12:00 position. This corresponded to the recently diagnosed invasive mammary carcinoma.  The patient was seen by Dr. Dwain Sarna and she proceeded to perform a lumpectomy and sentinel lymph node evaluation on 01/11/2013. The patient's pathology returned positive for an invasive ductal carcinoma measuring 1.9 cm. The margins were negative with the closest margin being 1 cm. The sentinel lymph nodes were negative. This therefore represented a T1 C. N0 M0 tumor.  -Asked to see the patient today for consideration of possible adjuvant radiotherapy. She is also scheduled to see medical oncology next week. Overall, the  patient indicates that she has been recovering well from her recent surgery although she does have some significant fatigue.   PREVIOUS RADIATION THERAPY: No   PAST MEDICAL HISTORY:  has a past medical history of Hyperlipidemia; Hypertension; Cold; Complication of anesthesia; Exertional shortness of breath; Uterine cancer; Chronic lower back pain; Depression; Arthritis; Breast cancer; Cancer; Allergy; and Osteoporosis.     PAST SURGICAL HISTORY: Past Surgical History  Procedure Laterality Date  . Replacement total knee Bilateral 2011-2012  . Foot tendon surgery Right   . Breast lumpectomy with needle localization and axillary sentinel lymph node bx Left 01/11/2013  . Breast biopsy Left 12/2012  . Abdominal hysterectomy  12/14/2012  . Dilation and curettage of uterus      "@ least one" (01/11/2013)  . Tubal ligation    . Cholecystectomy  1970  . Knee arthroscopy Right 1990's    "twice" (01/11/2013)  . Breast lumpectomy with needle localization and axillary sentinel lymph node bx Left 01/11/2013    Procedure: BREAST LUMPECTOMY WITH NEEDLE LOCALIZATION AND AXILLARY SENTINEL LYMPH NODE BX;  Surgeon: Emelia Loron, MD;  Location: [facility] OR;  Service: General;  Laterality: Left;     FAMILY HISTORY: family history is not on file.   SOCIAL HISTORY:  reports that she has never smoked. She has never used smokeless tobacco. She reports that she does not drink alcohol or use illicit drugs.   ALLERGIES: Iodine   MEDICATIONS:  Current Outpatient Prescriptions  Medication Sig Dispense Refill  . acetaminophen-codeine (TYLENOL #3) 300-30 MG per tablet Take 1 tablet by mouth every 6 (six) hours as needed for pain.      Marland Kitchen buPROPion (WELLBUTRIN XL) 150  MG 24 hr tablet Take 150 mg by mouth every morning.       . Calcium Carbonate-Vitamin D (CALTRATE 600+D) 600-400 MG-UNIT per tablet Take 1 tablet by mouth daily.      . carisoprodol (SOMA) 350 MG tablet Take 350 mg by mouth 2 (two) times daily.         . diclofenac (VOLTAREN) 75 MG EC tablet Take 75 mg by mouth 2 (two) times daily.       . Diclofenac Sodium (PENNSAID) 1.5 % SOLN Place 1 Bottle onto the skin daily as needed. For pain      . FIBER SELECT GUMMIES PO Take 2 tablets by mouth every morning.      Marland Kitchen HYDROcodone-acetaminophen (NORCO) 10-325 MG per tablet Take 1-2 tablets by mouth every 6 (six) hours as needed.  30 tablet  0  . metoprolol succinate (TOPROL-XL) 50 MG 24 hr tablet Take 50 mg by mouth 2 (two) times daily.       . Multiple Vitamin (MULTIVITAMIN WITH MINERALS) TABS tablet Take 1 tablet by mouth daily.      . RESTASIS 0.05 % ophthalmic emulsion Place 1 drop into both eyes 2 (two) times daily.       Marland Kitchen senna-docusate (SENOKOT-S) 8.6-50 MG per tablet Take 1 tablet by mouth daily.      . simvastatin (ZOCOR) 20 MG tablet Take 20 mg by mouth daily.       . Vitamin D, Ergocalciferol, (DRISDOL) 50000 UNITS CAPS capsule Take 50,000 Units by mouth every 7 (seven) days.       No current facility-administered medications for this encounter.     REVIEW OF SYSTEMS:  A 15 point review of systems is documented in the electronic medical record. This was obtained by the nursing staff. However, I reviewed this with the patient to discuss relevant findings and make appropriate changes.  Pertinent items are noted in HPI.    PHYSICAL EXAM:  height is 5\' 7"  (1.702 m) and weight is 323 lb 14.4 oz (146.92 kg). Her oral temperature is 97.5 F (36.4 C). Her blood pressure is 131/74 and her pulse is 88. Her respiration is 20 and oxygen saturation is 94%.   General: Well-developed, in no acute distress HEENT: Normocephalic, atraumatic; oral cavity clear Neck: Supple without any lymphadenopathy Cardiovascular: Regular rate and rhythm Respiratory: Clear to auscultation bilaterally Breasts: Well healing surgical incision in the left breast, no axillary adenopathy with a well healing axillary incision. Unremarkable exam on the right  GI: Soft,  nontender, normal bowel sounds Extremities: No edema present Neuro: No focal deficits     LABORATORY DATA:  Lab Results  Component Value Date   WBC 6.4 01/11/2013   HGB 11.7* 01/11/2013   HCT 35.9* 01/11/2013   MCV 78.0 01/11/2013   PLT 200 01/11/2013   Lab Results  Component Value Date   NA 139 01/05/2013   K 4.1 01/05/2013   CL 102 01/05/2013   CO2 26 01/05/2013   Lab Results  Component Value Date   ALT 21 03/31/2010   AST 21 03/31/2010   ALKPHOS 71 03/31/2010   BILITOT 0.7 03/31/2010      RADIOGRAPHY: Dg Chest 2 View  01/05/2013   *RADIOLOGY REPORT*  Clinical Data: Preoperative respiratory evaluation prior to left breast lumpectomy and axillary sentinel node biopsy.  CHEST - 2 VIEW  Comparison: Two-view chest x-ray 03/31/2010, 12/20/2008, 12/10/2008.  Findings: Suboptimal inspiration due to body habitus which accounts for crowded bronchovascular markings at the  bases and accentuates the cardiac silhouette.  Taking this into account, cardiac silhouette normal in size.  Thoracic aorta mildly tortuous and atherosclerotic, unchanged.  Hilar and mediastinal contours otherwise unremarkable.  Lungs clear.  Bronchovascular markings normal.  Pulmonary vascularity normal.  No pneumothorax.  No pleural effusions.  Degenerative changes and DISH involving the thoracic spine.  Degenerative changes in the right shoulder.  IMPRESSION: Suboptimal inspiration.  No acute cardiopulmonary disease.   Original Report Authenticated By: Hulan Saas, M.D.   Nm Sentinel Node Inj-no Rpt (breast)  01/11/2013   CLINICAL DATA: left axillary snbx   Sulfur colloid was injected intradermally by the nuclear medicine  technologist for breast cancer sentinel node localization.    [employer] Breast Loc Dev   1st Lesion  Inc US Guide  01/11/2013   *RADIOLOGY REPORT*  Clinical Data:  Patient presents for ultrasound-guided needle localization of a biopsy-proven malignancy over the 12 o'clock position of the left breast prior  to surgical excision.  NEEDLE LOCALIZATION USING ULTRASOUND GUIDANCE AND SPECIMEN RADIOGRAPH  Comparison: Previous exams.  Patient presents for needle localization prior to surgical excision.  I met with the patient and we discussed the procedure of needle localization including benefits and alternatives. We discussed the high likelihood of a successful procedure. We discussed the risks of the procedure, including infection, bleeding, tissue injury, and further surgery. Informed, written consent was given. The usual time-out protocol was performed immediately prior to the procedure.  Using ultrasound guidance, sterile technique, 2% lidocaine and a 7 cm ultrawire needle device, the targeted mass/clip at the 12 o'clock position was localized using a medial to lateral approach. The films are marked for Dr. Dwain Sarna.  Specimen radiograph is performed at , and confirms the targeted mass/clip and wire present in the tissue sample.  The specimen is marked for pathology.  IMPRESSION: Needle localization left breast mass/clip breast.  No apparent complications.   Original Report Authenticated By: Elberta Fortis, M.D.       IMPRESSION: The patient is status post esophagectomy for a T1 C. N0 M0 invasive ductal carcinoma of the left breast. The patient has had significant issues recently including by report endometrial cancer for which she has undergone recent surgery for this as well. The patient and I discussed the potential role for radiotherapy in the setting of breast conservation treatment. We discussed the potential benefit in terms of local/regional control as well as the potential side effects and risks of treatment as well. All of her questions were answered. We discussed the logistics of a potential for week course of radiotherapy.  Overall, I believe that the patient is in a favorable category and I discussed the potential possibility of omitting radiation treatment. This is based on the patient's presentation  including age/tumor stage/margins/etc. The patient also is very concerned about her overall status given multiple surgeries over the last several months. She is scheduled to see medical oncology next week. If she does proceed with hormonal treatment, then I would be comfortable with her not receiving adjuvant radiotherapy. As I discussed with the patient, I believe that there would be a small local control benefit with a doubtful significant overall survival benefit. If she does not proceed with hormonal treatment, then I recommend adjuvant radiotherapy and I would proceed with a four-week course of treatment at the appropriate time. This will be further discussed with medical oncology and I will await their decision.    PLAN: No definite followup schedule planned at this time. The patient  will make further decisions after seeing medical oncology next week.    I spent 60 minutes minutes face to face with the patient and more than 50% of that time was spent in counseling and/or coordination of care.    ________________________________   Radene Gunning, MD, PhD

## 2013-01-31 ENCOUNTER — Telehealth (INDEPENDENT_AMBULATORY_CARE_PROVIDER_SITE_OTHER): Payer: Self-pay | Admitting: *Deleted

## 2013-01-31 NOTE — Telephone Encounter (Signed)
No lifting over 10 pounds with left arm otherwise as tolerated

## 2013-01-31 NOTE — Telephone Encounter (Signed)
Don with PT called to ask if patient has any restrictions.  Explained that it is not documented that she does and we typically like patient to get moving so it is most likely activity as tolerated.  However Roe Coombs is asking that a message be sent to Dr. Dwain Sarna to verify this before he starts working with the patient.  Explained that as soon as we have a response we will call him back.  Roe Coombs states understanding at this time of plan

## 2013-02-01 ENCOUNTER — Telehealth: Payer: Self-pay | Admitting: *Deleted

## 2013-02-01 NOTE — Telephone Encounter (Signed)
Left message for a return phone call.  Awaiting patient response. 

## 2013-02-01 NOTE — Telephone Encounter (Signed)
Called and left message for [name] to update him on below message for restrictions from Dr. Dwain Sarna.

## 2013-02-05 ENCOUNTER — Other Ambulatory Visit: Payer: Self-pay | Admitting: Emergency Medicine

## 2013-02-05 ENCOUNTER — Encounter: Payer: Self-pay | Admitting: Oncology

## 2013-02-05 ENCOUNTER — Other Ambulatory Visit: Payer: Medicare Other | Admitting: Lab

## 2013-02-05 ENCOUNTER — Ambulatory Visit: Payer: Medicare Other

## 2013-02-05 ENCOUNTER — Ambulatory Visit (HOSPITAL_BASED_OUTPATIENT_CLINIC_OR_DEPARTMENT_OTHER): Payer: Medicare Other | Admitting: Oncology

## 2013-02-05 VITALS — BP 143/81 | HR 99 | Temp 98.5°F | Resp 20 | Ht 67.0 in | Wt 320.2 lb

## 2013-02-05 DIAGNOSIS — C50419 Malignant neoplasm of upper-outer quadrant of unspecified female breast: Secondary | ICD-10-CM

## 2013-02-05 DIAGNOSIS — C50412 Malignant neoplasm of upper-outer quadrant of left female breast: Secondary | ICD-10-CM

## 2013-02-05 NOTE — Progress Notes (Signed)
[name] [id number] 10-01-1940 72 y.o. 02/05/2013 4:52 PM  CC  [name]., MD [address] [place] [address] Dr. Emelia Loron Dr. Dorothy Puffer  REASON FOR CONSULTATION:  72 year old female with diagnosis of breast cancer in the left breast. Patient is seen in medical oncology for discussion of treatment options  STAGE:   Breast cancer of upper-outer quadrant of left female breast   Primary site: Breast (Left)   Staging method: AJCC 7th Edition   Pathologic: Stage IA (T1c, N0, cM0) signed by Emelia Loron, MD on 01/24/2013  1:53 PM   Summary: Stage IA (T1c, N0, cM0)  REFERRING PHYSICIAN: Dr. Emelia Loron  HISTORY OF PRESENT ILLNESS:  [name] is a 72 y.o. female.  With multiple medical issues. Patient had recently hysterectomy/BSO for endometrial cancer at [facility] She has some difficulty recovering from it wasn't a skilled nursing facility for a little while. She felt a possible mass within the left breast and had mammogram and ultrasound performed that were suspicious. 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 On 12/26/2012 patient had an MRI performed that showed a 1.9 cm irregular enhancing mass within the left breast at the 12:00 position. She was seen by Dr. Emelia Loron and on a 21 2014 patient underwent a lumpectomy with sentinel lymph node biopsy. Her final pathology revealed invasive ductal carcinoma measuring 1.9 cm all margins were negative. Sentinel node was negative. Final pathologic staging was T1 C. N0 M0 (stage I). Patient has been seen by Dr. Dorothy Puffer for possible adjuvant radiation therapy. She is now seen here for discussion of adjuvant treatment.   Past Medical History: Past Medical History  Diagnosis Date  . Hyperlipidemia   . Hypertension   . Cold     just started 01/02/13 cough  . Complication of anesthesia     very phobic  about needles.last surgery had to sedate before doing iv  . Exertional shortness of breath   . Uterine cancer   . Chronic lower back pain   . Depression   . Arthritis     "joints; mostly on the right side" (01/11/2013)  . Breast cancer     "left" (01/11/2013)  . Cancer     endometrial  . Allergy   . Osteoporosis     knees     Past Surgical History: Past Surgical History  Procedure Laterality Date  . Replacement total knee Bilateral 2011-2012  . Foot tendon surgery Right   . Breast lumpectomy with needle localization and axillary sentinel lymph node bx Left 01/11/2013  . Breast biopsy Left 12/2012  . Abdominal hysterectomy  12/14/2012  . Dilation and curettage of uterus      "@ least one" (01/11/2013)  . Tubal ligation    . Cholecystectomy  1970  . Knee arthroscopy Right 1990's    "twice" (01/11/2013)  . Breast lumpectomy with needle localization and axillary sentinel lymph node bx Left 01/11/2013    Procedure: BREAST LUMPECTOMY WITH NEEDLE LOCALIZATION AND AXILLARY SENTINEL LYMPH NODE BX;  Surgeon: Emelia Loron, MD;  Location: [facility] OR;  Service: General;  Laterality: Left;    Family History: History reviewed. No pertinent family history.  Social History History  Substance Use Topics  . Smoking status: Never Smoker   . Smokeless tobacco: Never Used  . Alcohol Use: No    Allergies: Allergies  Allergen Reactions  . Iodine Rash    Current Medications: Current  Outpatient Prescriptions  Medication Sig Dispense Refill  . acetaminophen-codeine (TYLENOL #3) 300-30 MG per tablet Take 1 tablet by mouth every 6 (six) hours as needed for pain.      Marland Kitchen buPROPion (WELLBUTRIN XL) 150 MG 24 hr tablet Take 150 mg by mouth every morning.       . Calcium Carbonate-Vitamin D (CALTRATE 600+D) 600-400 MG-UNIT per tablet Take 1 tablet by mouth daily.      . carisoprodol (SOMA) 350 MG tablet Take 350 mg by mouth 2 (two) times daily.       . diclofenac (VOLTAREN) 75 MG EC tablet Take 75 mg  by mouth 2 (two) times daily.       . Diclofenac Sodium (PENNSAID) 1.5 % SOLN Place 1 Bottle onto the skin daily as needed. For pain      . FIBER SELECT GUMMIES PO Take 2 tablets by mouth every morning.      Marland Kitchen HYDROcodone-acetaminophen (NORCO) 10-325 MG per tablet Take 1-2 tablets by mouth every 6 (six) hours as needed.  30 tablet  0  . metoprolol succinate (TOPROL-XL) 50 MG 24 hr tablet Take 50 mg by mouth 2 (two) times daily.       . Multiple Vitamin (MULTIVITAMIN WITH MINERALS) TABS tablet Take 1 tablet by mouth daily.      . RESTASIS 0.05 % ophthalmic emulsion Place 1 drop into both eyes 2 (two) times daily.       Marland Kitchen senna-docusate (SENOKOT-S) 8.6-50 MG per tablet Take 1 tablet by mouth daily.      . simvastatin (ZOCOR) 20 MG tablet Take 20 mg by mouth daily.       . Vitamin D, Ergocalciferol, (DRISDOL) 50000 UNITS CAPS capsule Take 50,000 Units by mouth every 7 (seven) days.       No current facility-administered medications for this visit.    OB/GYN History:patient had menarche at age 8 she underwent surgical menopause in 1994 secondary to bleeding. She's had to live births first live birth was at 50. She had been on hormone replacement therapy now discontinued  Fertility Discussion: not applicable Prior History of Cancer: endometrial carcinoma  Health Maintenance:  Colonoscopy yes Bone Density yes Last PAP smear March 2014  ECOG PERFORMANCE STATUS: 0 - Asymptomatic  Genetic Counseling/testing:  REVIEW OF SYSTEMS:  Patient has multiple complaints including chills night sweats insomnia she also complains of pain in her right side in knees and neck. She does complain of having shortness of breath on walking up the stairs she uses 2-3 pillows she does have a dry cough. She's noticed some changes in her stool and color. She does have incontinence and she said urinary tract infections in the past. She does have breast tenderness currently because of the lumpectomy. She has chronic  back pain joint pain arthritic type of pain she has difficulty in walking. She complains of weakness forgetfulness and numbness as well as anxiety and depression and hot flashes. Remainder of the 14 point review of systems is negative.  PHYSICAL EXAMINATION: Blood pressure 143/81, pulse 99, temperature 98.5 F (36.9 C), temperature source Oral, resp. rate 20, height 5\' 7"  (1.702 m), weight 320 lb 3.2 oz (145.242 kg).  ZOX:WRUEA, no distress and anxious SKIN: skin color, texture, turgor are normal HEAD: Normocephalic EYES: PERRLA, EOMI, Conjunctiva are pink and non-injected EARS: External ears normal OROPHARYNX:no exudate and no erythema  NECK: no adenopathy LYMPH:  no palpable lymphadenopathy BREAST:right breast normal without mass, skin or nipple changes or axillary nodes,  surgical scars noted in the left breast healing well LUNGS: clear to auscultation and percussion HEART: regular rate & rhythm ABDOMEN:non-tender, normal bowel sounds and no masses or organomegaly BACK: Back symmetric, no curvature. EXTREMITIES:no joint deformities, effusion, or inflammation, no edema, no clubbing, no cyanosis  NEURO: alert & oriented x 3 with fluent speech, no focal motor/sensory deficits, gait normal, reflexes normal and symmetric     STUDIES/RESULTS: Nm Sentinel Node Inj-no Rpt (breast)  01/11/2013   CLINICAL DATA: left axillary snbx   Sulfur colloid was injected intradermally by the nuclear medicine  technologist for breast cancer sentinel node localization.    [facility] Breast Loc Dev   1st Lesion  Inc US Guide  01/11/2013   *RADIOLOGY REPORT*  Clinical Data:  Patient presents for ultrasound-guided needle localization of a biopsy-proven malignancy over the 12 o'clock position of the left breast prior to surgical excision.  NEEDLE LOCALIZATION USING ULTRASOUND GUIDANCE AND SPECIMEN RADIOGRAPH  Comparison: Previous exams.  Patient presents for needle localization prior to surgical excision.  I met  with the patient and we discussed the procedure of needle localization including benefits and alternatives. We discussed the high likelihood of a successful procedure. We discussed the risks of the procedure, including infection, bleeding, tissue injury, and further surgery. Informed, written consent was given. The usual time-out protocol was performed immediately prior to the procedure.  Using ultrasound guidance, sterile technique, 2% lidocaine and a 7 cm ultrawire needle device, the targeted mass/clip at the 12 o'clock position was localized using a medial to lateral approach. The films are marked for Dr. Dwain Sarna.  Specimen radiograph is performed at , and confirms the targeted mass/clip and wire present in the tissue sample.  The specimen is marked for pathology.  IMPRESSION: Needle localization left breast mass/clip breast.  No apparent complications.   Original Report Authenticated By: Elberta Fortis, M.D.     LABS:    Chemistry      Component Value Date/Time   NA 139 01/05/2013 1357   K 4.1 01/05/2013 1357   CL 102 01/05/2013 1357   CO2 26 01/05/2013 1357   BUN 12 01/05/2013 1357   CREATININE 0.95 01/11/2013 1422      Component Value Date/Time   CALCIUM 9.6 01/05/2013 1357   ALKPHOS 71 03/31/2010 1115   AST 21 03/31/2010 1115   ALT 21 03/31/2010 1115   BILITOT 0.7 03/31/2010 1115      Lab Results  Component Value Date   WBC 6.4 01/11/2013   HGB 11.7* 01/11/2013   HCT 35.9* 01/11/2013   MCV 78.0 01/11/2013   PLT 200 01/11/2013   PATHOLOGY: ADDITIONAL INFORMATION: 1. CHROMOGENIC IN-SITU HYBRIDIZATION Results: HER-2/NEU BY CISH - NO AMPLIFICATION OF HER-2 DETECTED. RESULT RATIO OF HER2: CEP 17 SIGNALS 1.36 AVERAGE HER2 COPY NUMBER PER CELL 1.90 REFERENCE RANGE NEGATIVE HER2/Chr17 Ratio <2.0 and Average HER2 copy number <4.0 EQUIVOCAL HER2/Chr17 Ratio <2.0 and Average HER2 copy number 4.0 and <6.0 POSITIVE HER2/Chr17 Ratio >=2.0 and/or Average HER2 copy number >=6.0 Pecola Leisure  MD Pathologist, Electronic Signature ( Signed 01/17/2013) FINAL DIAGNOSIS Diagnosis 1. Breast, lumpectomy, Left - INVASIVE DUCTAL CARCINOMA, 1.9 CM. - MARGINS NOT INVOLVED. - FIBROCYSTIC CHANGES. 2. Breast, excision, Left additional posterior margin - BENIGN FIBROADIPOSE TISSUE. - NO EVIDENCE OF MALIGNANCY. 3. Lymph node, sentinel, biopsy, Left breast - ONE BENIGN LYMPH NODE (0/1). 1 of 4 FINAL for Sherry, Leon ([id number]) Diagnosis(continued) 4. Lymph node, sentinel, biopsy, Left breast - ONE BENIGN LYMPH NODE (0/1). 5.  Lymph node, sentinel, biopsy, Left breast - THERE IS NO EVIDENCE OF CARCINOMA IN ONE OF ONE LYMPH NODE (0/1). - SEE COMMENT. Microscopic Comment 1. BREAST, INVASIVE TUMOR, WITH LYMPH NODE SAMPLING Specimen, including laterality: Left breast Procedure: Needle localized lumpectomy Grade: I Tubule formation: III Nuclear pleomorphism: II Mitotic: I Tumor size (gross measurement): 1.9 cm Margins: Free of tumor Invasive, distance to closest margin: 1 cm In-situ, distance to closest margin: N/A If margin positive, focally or broadly: N/A Lymphovascular invasion: No Ductal carcinoma in situ: No Grade: N/A Extensive intraductal component: N/A Lobular neoplasia: No Tumor focality: Unifocal Treatment effect: No If present, treatment effect in breast tissue, lymph nodes or both: N/A Extent of tumor: Skin: N/A Nipple: N/A Skeletal muscle: N/A Lymph nodes: # examined: 2 Lymph nodes with metastasis: 0 Isolated tumor cells (< 0.2 mm): 0 Micrometastasis: (> 0.2 mm and < 2.0 mm): 0 Macrometastasis: (> 2.0 mm): 0 Extracapsular extension: N/A Breast prognostic profile: Case #[id number] Estrogen receptor: 100%, strong staining Progesterone receptor: 79%, strong staining Her 2 neu: No amplification, ratio 1.31. Will be repeated on current specimen. Ki-67: 17% Non-neoplastic breast: Fibrocystic changes TNM: pT1c, pN0, pMX (JDP:caf 01/12/13) 5. Dr. Colonel Bald has  only reviewed the part 5 lymph node in Dr. Rosezena Sensor absence. The rest of the case (parts 1-4) were reviewed by Dr. Luisa Hart. (JBK:kh 01-18-13) Pecola Leisure MD  ASSESSMENT    72 year old female with  #11.9 cm ER positive HER-2/neu negative invasive ductal carcinoma of the left breast status post lumpectomy sentinel lymph node biopsy. Sentinel nodes were negative. Patient is now seen in medical oncology for discussion of treatment options. I have recommended doing an Oncotype DX study. Patient also is concerned about her risk of disease anywhere else. We'll also do a staging scan.  #2 patient has been seen by Dr. Dorothy Puffer and eventually she will receive adjuvant radiation therapy.  Clinical Trial Eligibility:no Multidisciplinary conference discussionnno     PLAN:    #1 proceed with Oncotype DX testing.  #2 we will do CT of the chest abdomen and pelvis.  #3 I will see the patient.in 2-3 weeks' time        Discussion: Patient is being treated per NCCN breast cancer care guidelines appropriate for stage.I   Thank you so much for allowing me to participate in the care of [name]. I will continue to follow up the patient with you and assist in her care.  All questions were answered. The patient knows to call the clinic with any problems, questions or concerns. We can certainly see the patient much sooner if necessary.  I spent 40 minutes counseling the patient face to face. The total time spent in the appointment was 60 minutes.  Drue Second, MD Medical/Oncology [facility] [phone] (beeper) [phone] (Office)  02/05/2013, 4:52 PM

## 2013-02-05 NOTE — Patient Instructions (Addendum)
Proceed with oncotype Dx  Staging scans  We will see you back in 2 - 3 months

## 2013-02-06 ENCOUNTER — Telehealth: Payer: Self-pay | Admitting: Oncology

## 2013-02-06 ENCOUNTER — Encounter: Payer: Self-pay | Admitting: *Deleted

## 2013-02-06 ENCOUNTER — Other Ambulatory Visit: Payer: Self-pay | Admitting: Internal Medicine

## 2013-02-06 DIAGNOSIS — R0602 Shortness of breath: Secondary | ICD-10-CM

## 2013-02-06 NOTE — Progress Notes (Signed)
Oncotype sent per Dr. Welton Flakes order. Requisition sent to pathology.

## 2013-02-06 NOTE — Telephone Encounter (Signed)
, °

## 2013-02-07 ENCOUNTER — Other Ambulatory Visit: Payer: Self-pay | Admitting: Emergency Medicine

## 2013-02-08 ENCOUNTER — Ambulatory Visit (HOSPITAL_COMMUNITY)
Admission: RE | Admit: 2013-02-08 | Discharge: 2013-02-08 | Disposition: A | Discharge status: Home / Self Care | Payer: Medicare Other | Source: Ambulatory Visit | Attending: Oncology | Admitting: Oncology

## 2013-02-08 ENCOUNTER — Other Ambulatory Visit (HOSPITAL_COMMUNITY): Payer: Medicare Other

## 2013-02-08 ENCOUNTER — Other Ambulatory Visit: Payer: Medicare Other

## 2013-02-08 DIAGNOSIS — C50919 Malignant neoplasm of unspecified site of unspecified female breast: Secondary | ICD-10-CM | POA: Insufficient documentation

## 2013-02-08 DIAGNOSIS — C50412 Malignant neoplasm of upper-outer quadrant of left female breast: Secondary | ICD-10-CM

## 2013-02-08 DIAGNOSIS — R0602 Shortness of breath: Secondary | ICD-10-CM

## 2013-02-08 DIAGNOSIS — K449 Diaphragmatic hernia without obstruction or gangrene: Secondary | ICD-10-CM | POA: Insufficient documentation

## 2013-02-08 DIAGNOSIS — IMO0002 Reserved for concepts with insufficient information to code with codable children: Secondary | ICD-10-CM | POA: Insufficient documentation

## 2013-02-08 DIAGNOSIS — K551 Chronic vascular disorders of intestine: Secondary | ICD-10-CM | POA: Insufficient documentation

## 2013-02-08 DIAGNOSIS — C549 Malignant neoplasm of corpus uteri, unspecified: Secondary | ICD-10-CM | POA: Insufficient documentation

## 2013-02-08 DIAGNOSIS — K5730 Diverticulosis of large intestine without perforation or abscess without bleeding: Secondary | ICD-10-CM | POA: Insufficient documentation

## 2013-02-08 MED ORDER — IOHEXOL 350 MG/ML SOLN
125.0000 mL | Freq: Once | INTRAVENOUS | Status: AC | PRN
  Administered 2013-02-08: 125 mL via INTRAVENOUS

## 2013-02-09 ENCOUNTER — Ambulatory Visit (INDEPENDENT_AMBULATORY_CARE_PROVIDER_SITE_OTHER): Payer: Medicare Other | Admitting: Podiatry

## 2013-02-09 ENCOUNTER — Encounter: Payer: Self-pay | Admitting: Podiatry

## 2013-02-09 VITALS — Ht 66.0 in | Wt 320.0 lb

## 2013-02-09 DIAGNOSIS — M25579 Pain in unspecified ankle and joints of unspecified foot: Secondary | ICD-10-CM

## 2013-02-09 DIAGNOSIS — R609 Edema, unspecified: Secondary | ICD-10-CM

## 2013-02-09 NOTE — Progress Notes (Signed)
Patient came in via walker, accompanied by son. Had problem selecting compression socks. Patient has had lumpectomy left and three biopsies on left breast. Surgery done in January 11, 2013. Follow up treatment not established yet. In June or July diagnosed for breast cancer this year.   Objective: Patient is in good spirit.  Both pedal edema has decreased some degree. Still have 2+ edema and less pitting. Has not been to water exercise. She could not make arrangement.   Assessment: Bilateral pedal and lower leg edema improving.   Plan: Continue to try to get compression socks.  Will check back in 4-6 weeks.

## 2013-02-09 NOTE — Patient Instructions (Addendum)
Follow up visit on swollen leg bilateral. Will be good if can manage to handle compression socks.  Will prescribe for compression stocking 20-30 mmHg for start. Will check back again in 4-6 weeks.

## 2013-02-13 ENCOUNTER — Other Ambulatory Visit: Payer: Medicare Other

## 2013-02-13 ENCOUNTER — Other Ambulatory Visit (HOSPITAL_COMMUNITY): Payer: Medicare Other

## 2013-02-26 ENCOUNTER — Ambulatory Visit (HOSPITAL_BASED_OUTPATIENT_CLINIC_OR_DEPARTMENT_OTHER): Payer: Medicare Other | Admitting: Oncology

## 2013-02-26 ENCOUNTER — Telehealth: Payer: Self-pay | Admitting: Oncology

## 2013-02-26 VITALS — BP 148/83 | HR 91 | Temp 97.8°F | Resp 20 | Ht 66.0 in | Wt 316.0 lb

## 2013-02-26 DIAGNOSIS — M2550 Pain in unspecified joint: Secondary | ICD-10-CM

## 2013-02-26 DIAGNOSIS — C50412 Malignant neoplasm of upper-outer quadrant of left female breast: Secondary | ICD-10-CM

## 2013-02-26 DIAGNOSIS — C50919 Malignant neoplasm of unspecified site of unspecified female breast: Secondary | ICD-10-CM

## 2013-02-26 DIAGNOSIS — I890 Lymphedema, not elsewhere classified: Secondary | ICD-10-CM

## 2013-02-26 NOTE — Telephone Encounter (Signed)
lmonvm advising the pt of her md appt in jan 2015 with dr Welton Flakes.

## 2013-02-26 NOTE — Progress Notes (Signed)
OFFICE PROGRESS NOTE  CCAlva Leon., MD [address] [place] [address] Dr. Emelia Loron  Dr. Dorothy Puffer  DIAGNOSIS: 72 year old female with diagnosis of breast cancer in the left breast.   STAGE:  Breast cancer of upper-outer quadrant of left female breast  Primary site: Breast (Left)  Staging method: AJCC 7th Edition  Pathologic: Stage IA (T1c, N0, cM0) signed by Emelia Loron, MD on 01/24/2013 1:53 PM  Summary: Stage IA (T1c, N0, cM0  PRIOR THERAPY:  #1Patient had recently hysterectomy/BSO for endometrial cancer at [facility]. She has some difficulty recovering from it wasn't a skilled nursing facility for a little while. She felt a possible mass within the left breast and had mammogram and ultrasound performed that were suspicious. 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 On 12/26/2012 patient had an MRI performed that showed a 1.9 cm irregular enhancing mass within the left breast at the 12:00 position. She was seen by Dr. Emelia Loron and on a 21 2014 patient underwent a lumpectomy with sentinel lymph node biopsy. Her final pathology revealed invasive ductal carcinoma measuring 1.9 cm all margins were negative. Sentinel node was negative. Final pathologic staging was T1 C. N0 M0 (stage I).   #2 patient had staging studies performed after her next last visit with me. They are negative for any evidence of metastatic disease.  #3 patient had Oncotype DX testing performed her recurrence score was low at 10 giving her a 7% risk of distant recurrence with tamoxifen therapy alone.  #4 patient does need adjuvant radiation therapy and she has been seen by Dr. Dorothy Puffer. I will refer her back to Dr. Mitzi Hansen for beginning radiation adjuvantly.  CURRENT THERAPY:begin adjuvant radiation therapy  INTERVAL HISTORY: [name] 72 y.o. female returns for followup visit to  discuss her scans results as well as Oncotype DX testing results. I have informed her that her CAT scans were negative for distant recurrence. I do not suspect that there would be any evidence of distant recurrence since patient did have a small tumor. However [name] was very concerned since she does have some aches and pain as well as lymphedema which is chronic and ongoing. We discussed her Oncotype score which was a 10 giving her a risk of distant recurrence of about 7% with tamoxifen only. This is a low risk category. Therefore she does not need any chemotherapy. I have explained this to the patient. The benefit of chemotherapy would be negligible the risks would be quite a bit in this individual.  MEDICAL HISTORY: Past Medical History  Diagnosis Date  . Hyperlipidemia   . Hypertension   . Cold     just started 01/02/13 cough  . Complication of anesthesia     very phobic about needles.last surgery had to sedate before doing iv  . Exertional shortness of breath   . Uterine cancer   . Chronic lower back pain   . Depression   . Arthritis     "joints; mostly on the right side" (01/11/2013)  . Breast cancer     "left" (01/11/2013)  . Cancer     endometrial  . Allergy   . Osteoporosis     knees     ALLERGIES:  is allergic to iodine.  MEDICATIONS:  Current Outpatient Prescriptions  Medication Sig Dispense Refill  . acetaminophen-codeine (TYLENOL #3) 300-30 MG per tablet Take 1 tablet by mouth every 6 (six)  hours as needed for pain.      Marland Kitchen ALPRAZolam (XANAX) 0.25 MG tablet       . buPROPion (WELLBUTRIN XL) 150 MG 24 hr tablet Take 150 mg by mouth every morning.       . Calcium Carbonate-Vitamin D (CALTRATE 600+D) 600-400 MG-UNIT per tablet Take 1 tablet by mouth daily.      . carisoprodol (SOMA) 350 MG tablet Take 350 mg by mouth 2 (two) times daily.       . diclofenac (VOLTAREN) 75 MG EC tablet Take 75 mg by mouth 2 (two) times daily.       . Diclofenac Sodium (PENNSAID) 1.5 % SOLN  Place 1 Bottle onto the skin daily as needed. For pain      . FIBER SELECT GUMMIES PO Take 2 tablets by mouth every morning.      Marland Kitchen HYDROcodone-acetaminophen (NORCO) 10-325 MG per tablet Take 1-2 tablets by mouth every 6 (six) hours as needed.  30 tablet  0  . metoprolol succinate (TOPROL-XL) 50 MG 24 hr tablet Take 50 mg by mouth 2 (two) times daily.       . Multiple Vitamin (MULTIVITAMIN WITH MINERALS) TABS tablet Take 1 tablet by mouth daily.      . RESTASIS 0.05 % ophthalmic emulsion Place 1 drop into both eyes 2 (two) times daily.       Marland Kitchen senna-docusate (SENOKOT-S) 8.6-50 MG per tablet Take 1 tablet by mouth daily.      . simvastatin (ZOCOR) 20 MG tablet Take 20 mg by mouth daily.       . Vitamin D, Ergocalciferol, (DRISDOL) 50000 UNITS CAPS capsule Take 50,000 Units by mouth every 7 (seven) days.       No current facility-administered medications for this visit.    SURGICAL HISTORY:  Past Surgical History  Procedure Laterality Date  . Replacement total knee Bilateral 2011-2012  . Foot tendon surgery Right   . Breast lumpectomy with needle localization and axillary sentinel lymph node bx Left 01/11/2013  . Breast biopsy Left 12/2012  . Abdominal hysterectomy  12/14/2012  . Dilation and curettage of uterus      "@ least one" (01/11/2013)  . Tubal ligation    . Cholecystectomy  1970  . Knee arthroscopy Right 1990's    "twice" (01/11/2013)  . Breast lumpectomy with needle localization and axillary sentinel lymph node bx Left 01/11/2013    Procedure: BREAST LUMPECTOMY WITH NEEDLE LOCALIZATION AND AXILLARY SENTINEL LYMPH NODE BX;  Surgeon: Emelia Loron, MD;  Location: [facility] OR;  Service: General;  Laterality: Left;    REVIEW OF SYSTEMS:  Pertinent items are noted in HPI.   HEALTH MAINTENANCE:  PHYSICAL EXAMINATION: Blood pressure 148/83, pulse 91, temperature 97.8 F (36.6 C), temperature source Oral, resp. rate 20, height 5\' 6"  (1.676 m), weight 316 lb (143.337 kg). Body mass index  is 51.03 kg/(m^2). ECOG PERFORMANCE STATUS: 0 - Asymptomatic   General appearance: alert, cooperative and appears stated age Lymph nodes: Cervical, supraclavicular, and axillary nodes normal. Resp: clear to auscultation bilaterally Cardio: regular rate and rhythm GI: soft, non-tender; bowel sounds normal; no masses,  no organomegaly Extremities: extremities normal, atraumatic, no cyanosis or edema   LABORATORY DATA: Lab Results  Component Value Date   WBC 6.4 01/11/2013   HGB 11.7* 01/11/2013   HCT 35.9* 01/11/2013   MCV 78.0 01/11/2013   PLT 200 01/11/2013      Chemistry      Component Value Date/Time   NA  139 01/05/2013 1357   K 4.1 01/05/2013 1357   CL 102 01/05/2013 1357   CO2 26 01/05/2013 1357   BUN 12 01/05/2013 1357   CREATININE 0.95 01/11/2013 1422      Component Value Date/Time   CALCIUM 9.6 01/05/2013 1357   ALKPHOS 71 03/31/2010 1115   AST 21 03/31/2010 1115   ALT 21 03/31/2010 1115   BILITOT 0.7 03/31/2010 1115       RADIOGRAPHIC STUDIES:  Ct Angio Chest Pe W/cm &/or Wo Cm  02/08/2013   *RADIOLOGY REPORT*  Clinical Data: Shortness of breath.  Breast cancer and endometrial cancer.  CT ANGIOGRAPHY CHEST  Technique:  Multidetector CT imaging of the chest using the standard protocol during bolus administration of intravenous contrast. Multiplanar reconstructed images including MIPs were obtained and reviewed to evaluate the vascular anatomy.  Contrast: OMNIPAQUE IOHEXOL 350 MG/ML SOLN  Comparison: Chest x-ray dated 01/05/2013  Findings: There are no pulmonary emboli, infiltrates or effusions. There is a tiny area of scarring in the lateral aspect of the lingula.  There is no hilar or mediastinal adenopathy.  Heart size and pulmonary vascularity are normal.  No acute osseous abnormality.  There is severe degenerative disc disease at T10-11.  Large masses are noted in the left breast consistent with carcinoma.  IMPRESSION:  1.  No pulmonary emboli or other acute disease in  the chest. 2.  Large masses in the left breast consistent with carcinoma.   Original Report Authenticated By: Francene Boyers, M.D.   Ct Abdomen Pelvis W Contrast  02/08/2013   *RADIOLOGY REPORT*  Clinical Data: Patient with history of left breast cancer. Endometrial cancer.  CT ABDOMEN AND PELVIS WITH CONTRAST  Technique:  Multidetector CT imaging of the abdomen and pelvis was performed following the standard protocol during bolus administration of intravenous contrast.  Contrast: OMNIPAQUE IOHEXOL 350 MG/ML SOLN  Comparison: None  Findings: Limited visualization of the lower thorax demonstrates minimal scarring within the lingula.  Minimal dependent atelectasis.  No pleural effusion.  Normal heart size.  Liver is normal in size and contour without focal hepatic lesion identified.  Portal veins are patent.  Spleen, pancreas and bilateral adrenal glands are unremarkable.  Kidneys enhance symmetrically with contrast.  Normal caliber abdominal aorta.  Calcified atherosclerotic plaque at the origin of the celiac and superior mesenteric arteries.  No retroperitoneal or pelvic lymphadenopathy. There is a 0.7 cm left external iliac lymph node.  Urinary bladder is grossly unremarkable.  Descending and sigmoid colonic diverticulosis without evidence for acute diverticulitis.  Off the anterior margin of the transverse colon (image 34; series 8) is a fat containing partially calcified mass which likely representing sequelae of prior epiploic appendagitis.  Normal appendix.  No evidence for abnormal bowel wall thickening or dilatation.  Small hiatal hernia.  Right greater than left hip joint and lower lumbar spine degenerative change.  No aggressive appearing osseous lesions.  IMPRESSION: 1. No evidence for metastatic disease within the abdomen or pelvis.   Original Report Authenticated By: Annia Belt, M.D    ASSESSMENT: 72 year old female with  #1 stage I (T1 C. N0) invasive ductal carcinoma. Patient is status  post lumpectomy the tumor was ER positive HER-2/neu negative. Oncotype score is low. Recommendation at this time is to proceed with radiation therapy first. After that she will receive adjuvant antiestrogen therapy most likely with either tamoxifen or an aromatase inhibitor. We discussed the rationale for adjuvant antiestrogen therapy.  #2 patient does have a history of  lymphedema unclear etiology I have recommended she continue to see her primary care physician regarding management of this.  #3 she also has history of pain joint ankle and feet. Certainly this would make me concerned about exacerbating her pains if I use an aromatase inhibitor but we will discuss further.   PLAN:   #1 proceed with radiation therapy.  #2 I will see the patient back after completion of radiation   All questions were answered. The patient knows to call the clinic with any problems, questions or concerns. We can certainly see the patient much sooner if necessary.  I spent 30 minutes counseling the patient face to face. The total time spent in the appointment was 30 minutes.    Drue Second, MD Medical/Oncology [facility] [phone] (beeper) [phone] (Office)  02/26/2013, 11:15 AM

## 2013-02-28 ENCOUNTER — Encounter: Payer: Self-pay | Admitting: *Deleted

## 2013-02-28 NOTE — Progress Notes (Signed)
Received Oncotype Dx results of 10.  Gave copy to MD.  Rochele Pages copy to Med Rec to scan.

## 2013-03-01 ENCOUNTER — Telehealth: Payer: Self-pay | Admitting: *Deleted

## 2013-03-01 ENCOUNTER — Encounter: Payer: Self-pay | Admitting: *Deleted

## 2013-03-01 NOTE — Telephone Encounter (Signed)
Called Dr.Kissner ,son of patient, made appt for his mother for 03/12/14 at 1:45pm to see Eusebio Friendly, Dietician, that's the earliest she can be seen, the 15 th wasn't available, Dr.Huynh thanked [place] for the appt, , will in basket Luciann, to disregard  My phone call to her earlier 3:47 PM

## 2013-03-01 NOTE — Progress Notes (Signed)
[facility] Clinical Social Work  Clinical Social Work was referred by nurse for assessment of psychosocial needs.  Clinical Social Worker phoned Pt and her son  to offer support and assess for needs.  Pt reports her son "is the one that needs you, not me." CSW introduced CSW and explained CSW role. Pt plans to attend the Breast Cancer Nutrition Class on 03/13/13. She is agreeable to meeting with CSW at her appt on 03/07/13 to further discuss concerns and resources/support to assist.     Clinical Social Work interventions: Supportive listening Follow up at appt.   Doreen Salvage, LCSW Clinical Social Worker [facility] Family [facility] Wednesday, Thursday and Friday Phone: [phone] Fax: [phone]

## 2013-03-02 NOTE — Progress Notes (Signed)
:       Location of Breast Cancer:Left Breast 12 0' clock position  Histology per Pathology ReportBreast,12/11/12: left, needle core biopsy, 12 o'clock, 15cm/nipple  - INVASIVE DUCTAL CARCINOMA WITH ABUNDANT MUCIN.  Receptor Status: ER(+), PR (+), Her2-neu (-)  Did patient present with symptoms (if so, please note symptoms) or was this found on screening mammography?: patient found herself thought she felt a mass,went for mammogram,/U/s and MRI,Mri+  Past/Anticipated interventions by surgeon, if any: 8/21/141. Breast, lumpectomy, Left- INVASIVE DUCTAL CARCINOMA, 1.9 CM.- MARGINS NOT INVOLVED.- FIBROCYSTIC CHANGES.2. Breast, excision, Left additional posterior margin- BENIGN FIBROADIPOSE TISSUE.- NO EVIDENCE OF MALIGNANCY.3. Lymph node, sentinel, biopsy, Left breast- ONE BENIGN LYMPH NODE (0/1).  Past/Anticipated interventions by medical oncology, if any: Chemotherapy 1st Scheduled appt with Dr.Khan 02/05/13 ,02/28/13 Oncotype DX results =10  Lymphedema issues, if any: no  Pain issues, if any: right shoulder And right knee , left Breast tingles every now annoying pain, pain injection in each hip yesterday with Dr. Andi Devon  At [facility] office, h SAFETY ISSUES:  Prior radiation? no  Pacemaker/ICD? no  Possible current pregnancy?no  Is the patient on methotrexate? no Current Complaints / other details: Recent Hysterectomy ( TAH/BSO for endometrial cancer)at [facility] before Breast cancer found Non smoker, no alcohol use,  Lowella Petties, RN  01/26/2013,11:22 AM

## 2013-03-05 ENCOUNTER — Encounter: Payer: Self-pay | Admitting: *Deleted

## 2013-03-05 ENCOUNTER — Telehealth: Payer: Self-pay | Admitting: Oncology

## 2013-03-05 NOTE — Telephone Encounter (Signed)
Faxed pt medical records to [facility]. (per pt)

## 2013-03-05 NOTE — Progress Notes (Signed)
Mailed after appt letter to pt. 

## 2013-03-07 ENCOUNTER — Encounter: Payer: Self-pay | Admitting: Radiation Oncology

## 2013-03-07 ENCOUNTER — Ambulatory Visit
Admission: RE | Admit: 2013-03-07 | Discharge: 2013-03-07 | Disposition: A | Discharge status: Home / Self Care | Payer: Medicare Other | Source: Ambulatory Visit | Attending: Radiation Oncology | Admitting: Radiation Oncology

## 2013-03-07 ENCOUNTER — Encounter: Payer: Self-pay | Admitting: *Deleted

## 2013-03-07 VITALS — BP 126/75 | HR 94 | Temp 97.4°F | Resp 20 | Ht 66.0 in | Wt 313.6 lb

## 2013-03-07 DIAGNOSIS — C50919 Malignant neoplasm of unspecified site of unspecified female breast: Secondary | ICD-10-CM | POA: Insufficient documentation

## 2013-03-07 DIAGNOSIS — C50412 Malignant neoplasm of upper-outer quadrant of left female breast: Secondary | ICD-10-CM

## 2013-03-07 DIAGNOSIS — F411 Generalized anxiety disorder: Secondary | ICD-10-CM | POA: Insufficient documentation

## 2013-03-07 DIAGNOSIS — Z170 Estrogen receptor positive status [ER+]: Secondary | ICD-10-CM | POA: Insufficient documentation

## 2013-03-07 DIAGNOSIS — Z79899 Other long term (current) drug therapy: Secondary | ICD-10-CM | POA: Insufficient documentation

## 2013-03-07 NOTE — Progress Notes (Signed)
Please see the Nurse Progress Note in the MD Initial Consult Encounter for this patient. 

## 2013-03-07 NOTE — Progress Notes (Signed)
Radiation Oncology         [phone] ________________________________  Name: [name] MRN: [id number]  Date: 03/07/2013  DOB: 1941/04/24  Follow-Up Visit Note  CC: [name]., MD  Emelia Loron, MD  Diagnosis:   Invasive ductal carcinoma of the left breast, T1 C. N0 M0 status post lumpectomy  Interval Since Last Radiation:  Not applicable   Narrative:  The patient returns today for routine follow-up.  The patient returns to clinic today for followup. She is status post lumpectomy for a T1 C. N0 tumor which is estrogen positive, progesterone positive, and HER-2/neu negative. The patient since she was last seen proceeded with an Oncotype test. This returned low risk and the decision has been made with medical oncology not to proceed with chemotherapy. She has continued to recover from surgery and she presents today in clinic for evaluation and coordination of a course of possible radiotherapy.                              ALLERGIES:  is allergic to iodine.  Meds: Current Outpatient Prescriptions  Medication Sig Dispense Refill  . acetaminophen-codeine (TYLENOL #3) 300-30 MG per tablet Take 1 tablet by mouth every 6 (six) hours as needed for pain.      Marland Kitchen ALPRAZolam (XANAX) 0.25 MG tablet Take 0.25 mg by mouth at bedtime as needed.       Marland Kitchen buPROPion (WELLBUTRIN XL) 150 MG 24 hr tablet Take 150 mg by mouth 2 (two) times daily.       . Calcium Carbonate-Vitamin D (CALTRATE 600+D) 600-400 MG-UNIT per tablet Take 1 tablet by mouth daily.      . carisoprodol (SOMA) 350 MG tablet Take 350 mg by mouth 2 (two) times daily.       . diclofenac (VOLTAREN) 75 MG EC tablet Take 75 mg by mouth 2 (two) times daily.       . Diclofenac Sodium (PENNSAID) 1.5 % SOLN Place 1 Bottle onto the skin daily as needed. For pain      . FIBER SELECT GUMMIES PO Take 2 tablets by mouth every morning.      Marland Kitchen HYDROcodone-acetaminophen (NORCO) 10-325 MG per tablet Take 1-2 tablets by mouth every 6  (six) hours as needed.  30 tablet  0  . metoprolol succinate (TOPROL-XL) 50 MG 24 hr tablet Take 50 mg by mouth 2 (two) times daily.       . Multiple Vitamin (MULTIVITAMIN WITH MINERALS) TABS tablet Take 1 tablet by mouth daily.      . RESTASIS 0.05 % ophthalmic emulsion Place 1 drop into both eyes 2 (two) times daily.       . simvastatin (ZOCOR) 20 MG tablet Take 20 mg by mouth daily.       . Vitamin D, Ergocalciferol, (DRISDOL) 50000 UNITS CAPS capsule Take 50,000 Units by mouth every 7 (seven) days.      Marland Kitchen senna-docusate (SENOKOT-S) 8.6-50 MG per tablet Take 1 tablet by mouth daily.       No current facility-administered medications for this encounter.    Physical Findings: The patient is in no acute distress. Patient is alert and oriented.  height is 5\' 6"  (1.676 m) and weight is 313 lb 9.6 oz (142.248 kg). Her oral temperature is 97.4 F (36.3 C). Her blood pressure is 126/75 and her pulse is 94. Her respiration is 20. .     Lab Findings: Lab Results  Component Value Date   WBC 6.4 01/11/2013   HGB 11.7* 01/11/2013   HCT 35.9* 01/11/2013   MCV 78.0 01/11/2013   PLT 200 01/11/2013     Radiographic Findings: Ct Angio Chest Pe W/cm &/or Wo Cm  02/08/2013   *RADIOLOGY REPORT*  Clinical Data: Shortness of breath.  Breast cancer and endometrial cancer.  CT ANGIOGRAPHY CHEST  Technique:  Multidetector CT imaging of the chest using the standard protocol during bolus administration of intravenous contrast. Multiplanar reconstructed images including MIPs were obtained and reviewed to evaluate the vascular anatomy.  Contrast: OMNIPAQUE IOHEXOL 350 MG/ML SOLN  Comparison: Chest x-ray dated 01/05/2013  Findings: There are no pulmonary emboli, infiltrates or effusions. There is a tiny area of scarring in the lateral aspect of the lingula.  There is no hilar or mediastinal adenopathy.  Heart size and pulmonary vascularity are normal.  No acute osseous abnormality.  There is severe degenerative  disc disease at T10-11.  Large masses are noted in the left breast consistent with carcinoma.  IMPRESSION:  1.  No pulmonary emboli or other acute disease in the chest. 2.  Large masses in the left breast consistent with carcinoma.   Original Report Authenticated By: Francene Boyers, M.D.   Ct Abdomen Pelvis W Contrast  02/08/2013   *RADIOLOGY REPORT*  Clinical Data: Patient with history of left breast cancer. Endometrial cancer.  CT ABDOMEN AND PELVIS WITH CONTRAST  Technique:  Multidetector CT imaging of the abdomen and pelvis was performed following the standard protocol during bolus administration of intravenous contrast.  Contrast: OMNIPAQUE IOHEXOL 350 MG/ML SOLN  Comparison: None  Findings: Limited visualization of the lower thorax demonstrates minimal scarring within the lingula.  Minimal dependent atelectasis.  No pleural effusion.  Normal heart size.  Liver is normal in size and contour without focal hepatic lesion identified.  Portal veins are patent.  Spleen, pancreas and bilateral adrenal glands are unremarkable.  Kidneys enhance symmetrically with contrast.  Normal caliber abdominal aorta.  Calcified atherosclerotic plaque at the origin of the celiac and superior mesenteric arteries.  No retroperitoneal or pelvic lymphadenopathy. There is a 0.7 cm left external iliac lymph node.  Urinary bladder is grossly unremarkable.  Descending and sigmoid colonic diverticulosis without evidence for acute diverticulitis.  Off the anterior margin of the transverse colon (image 34; series 8) is a fat containing partially calcified mass which likely representing sequelae of prior epiploic appendagitis.  Normal appendix.  No evidence for abnormal bowel wall thickening or dilatation.  Small hiatal hernia.  Right greater than left hip joint and lower lumbar spine degenerative change.  No aggressive appearing osseous lesions.  IMPRESSION: 1. No evidence for metastatic disease within the abdomen or pelvis.    Original Report Authenticated By: Annia Belt, M.D    Impression:    The patient had her case discussed in multidisciplinary breast conference. The decision has been made not to proceed with any chemotherapy. The patient does have some comorbidities but it was discussed that she still would likely benefit from improved local/regional control secondary to a course of adjuvant radiotherapy. She therefore presents today for further discussion of this. We discussed many aspects of a course of treatment in detail including the possible side effects and risks. He was accompanied by her son today and they do wish to proceed with that he would like to proceed with treatment as soon as possible.  The patient has some anxiety related to her treatment and current chest. The patient's  son who is a Warden/ranger inquired about a referral to a psychologist for evaluation. I will put in a request for this.  Plan:  Simulation tomorrow such that we can begin treatment planning I anticipate treating the patient with a hypo-fractionated 4 week course of treatment.  I spent 30 minutes with the patient today, the majority of which was spent counseling the patient on the diagnosis of cancer and coordinating care.   Radene Gunning, M.D., Ph.D.

## 2013-03-08 ENCOUNTER — Ambulatory Visit
Admission: RE | Admit: 2013-03-08 | Discharge: 2013-03-08 | Disposition: A | Discharge status: Home / Self Care | Payer: Medicare Other | Source: Ambulatory Visit | Attending: Radiation Oncology | Admitting: Radiation Oncology

## 2013-03-08 DIAGNOSIS — Z510 Encounter for antineoplastic radiation therapy: Secondary | ICD-10-CM | POA: Insufficient documentation

## 2013-03-08 DIAGNOSIS — Z79899 Other long term (current) drug therapy: Secondary | ICD-10-CM | POA: Insufficient documentation

## 2013-03-08 DIAGNOSIS — Y842 Radiological procedure and radiotherapy as the cause of abnormal reaction of the patient, or of later complication, without mention of misadventure at the time of the procedure: Secondary | ICD-10-CM | POA: Insufficient documentation

## 2013-03-08 DIAGNOSIS — C50919 Malignant neoplasm of unspecified site of unspecified female breast: Secondary | ICD-10-CM | POA: Insufficient documentation

## 2013-03-08 DIAGNOSIS — C50412 Malignant neoplasm of upper-outer quadrant of left female breast: Secondary | ICD-10-CM

## 2013-03-08 DIAGNOSIS — L989 Disorder of the skin and subcutaneous tissue, unspecified: Secondary | ICD-10-CM | POA: Insufficient documentation

## 2013-03-08 NOTE — Progress Notes (Addendum)
  Radiation Oncology         [phone] ________________________________  Name: [name] MRN: [id number]  Date: 03/08/2013  DOB: [birth year]  SIMULATION AND TREATMENT PLANNING NOTE  The patient presented for simulation prior to beginning her course of radiation treatment for her diagnosis of left-sided breast cancer. The patient was placed in a supine position on a breast board. A customized accuform device was also constructed and this complex treatment device will be used on a daily basis during her treatment. In this fashion, a CT scan was obtained through the chest area and an isocenter was placed near the chest wall within the left breast.  The patient will be planned to receive a course of radiation initially to a dose of 42.5 gray. This will consist of a whole breast radiotherapy technique. To accomplish this, 2 customized blocks have been designed which will correspond to medial and lateral whole breast tangent fields. This treatment will be accomplished at 1.8 gray per fraction. A complex isodose plan is requested to ensure that the breast target area is adequately covered dosimetrically. A forward planning technique will also be evaluated to determine if this approach improves the plan. It is anticipated that the patient will then receive a 7.5 gray boost to the seroma cavity which has been contoured. This will be accomplished at 2 gray per fraction. The final anticipated total dose therefore will correspond to 50 gray.  This initial treatment will consist of a 3-D conformal technique. The seroma has been contoured as the primary target structure. Additionally, dose volume histograms of both this target as well as the lungs and heart will also be evaluated. Such an approach is necessary to ensure that the target area is adequately covered while the nearby critical  normal structures are adequately spared.   _______________________________   Radene Gunning, MD, PhD   Addendum:    The patient's dosimetry has been reviewed. We were unable to achieve a plan which that the dosimetric goals of hyperfractionation. The patient therefore will receive a 6-1/2 week course of treatment. This will consist of 28 fractions of whole breast radiation therapy followed by a 5 fraction boost. The total anticipated dose will be 60.4 gray.

## 2013-03-08 NOTE — Progress Notes (Signed)
[facility] Clinical Social Work  Clinical Social Work was referred by patient and her son for assessment of psychosocial needs at their appointment at [facility]  Son requesting support services and support group. CSW thoroughly explained all support team resources and options for support. Pt interested in an [employer] and CSW made referral on Pt's behalf. Pt appeared calm and quiet. Many concerns brought forward by the son about Pt's coping. CSW made several suggestions for coping and provided supportive listening. CSW explained we would be glad to talk further an encouraged Pt to reach out for additional support. She plans to attend the support group next week. They have arranged [facility] transportation to help with appointments. They agree to contact CSW as needed.   Doreen Salvage, LCSW Clinical Social Worker [facility] Wednesday, Thursday and Friday Phone: [phone] Fax: [phone]

## 2013-03-12 ENCOUNTER — Ambulatory Visit: Payer: Medicare Other

## 2013-03-12 ENCOUNTER — Ambulatory Visit: Payer: Medicare Other | Admitting: Nutrition

## 2013-03-12 NOTE — Progress Notes (Signed)
This patient is a 72 year old female diagnosed with stage I breast cancer to receive radiation therapy.  She is a patient of Dr. Welton Flakes.  Visit requested by son.  Past medical history includes hyperlipidemia, hypertension, uterine cancer, depression, and osteoporosis.  Medications include Xanax, Wellbutrin, calcium/vitamin D, Fiber gummies, multivitamin, Senokot, Zocor, and vitamin D.  Labs were reviewed.  Height: 66 inches. Weight: 313.6 pounds. Usual body weight 320 pounds. BMI: 50.64.  Patient reports she does not typically eat on a schedule.  She eats about 2 meals a day.  She is not exercising at this time.  She is attempting to pay more attention to portion control.  Nutrition diagnosis: Overweight/obesity related to excessive energy intake and decreased physical activity as evidenced by BMI of 50.64.  Intervention: I educated patient on the importance of consuming 3 meals daily with the division of calories, somewhat equally between each meal.  I've discussed the importance of consuming a plant-based diet, high in vegetables, whole fruits, and whole grains.  I've educated her on lean protein sources.  We have discussed portion control.  I provided her with sample menus and reviewed how to make substitutions.  Patient encouraged to keep a food Journal.  I've encouraged increased activity as permitted by M.D.  Fact sheets were given.  Questions were answered.  Teach back method used.  Monitoring, evaluation, goals: Patient will tolerate a healthy, plant-based diet with controlled calories, consisting primarily of plant-based foods for slow, safe weight loss.  Next visit: No followup necessary at this time.  Patient has my contact information in case she develops questions or concerns.

## 2013-03-13 ENCOUNTER — Encounter: Payer: Self-pay | Admitting: Dietician

## 2013-03-13 ENCOUNTER — Ambulatory Visit: Payer: Medicare Other

## 2013-03-13 NOTE — Progress Notes (Signed)
Breast Cancer Nutrition Class Attendance Note  Date: 03/13/2013  Time: 1800  Pt attended [facility]'s Breast Cancer Nutrition Class, "Food For Your Fight". Pt was educated on basic cancer nutrition principles, including plant based diet and principles from AICR  ([employer]) about latest nutrition findings and recommendations. Questions answered. Handouts and recipes provided.   [name], RD, LDN Pager: [phone]  

## 2013-03-14 ENCOUNTER — Ambulatory Visit (INDEPENDENT_AMBULATORY_CARE_PROVIDER_SITE_OTHER): Payer: Medicare Other | Admitting: Psychiatry

## 2013-03-14 ENCOUNTER — Ambulatory Visit: Payer: Medicare Other

## 2013-03-14 DIAGNOSIS — F0630 Mood disorder due to known physiological condition, unspecified: Secondary | ICD-10-CM

## 2013-03-15 ENCOUNTER — Ambulatory Visit
Admission: RE | Admit: 2013-03-15 | Discharge: 2013-03-15 | Disposition: A | Discharge status: Home / Self Care | Payer: Medicare Other | Source: Ambulatory Visit | Attending: Radiation Oncology | Admitting: Radiation Oncology

## 2013-03-15 ENCOUNTER — Ambulatory Visit: Payer: Medicare Other

## 2013-03-15 DIAGNOSIS — C50412 Malignant neoplasm of upper-outer quadrant of left female breast: Secondary | ICD-10-CM

## 2013-03-15 NOTE — Progress Notes (Signed)
  Radiation Oncology         [phone]) [phone] ________________________________  Name: [name] MRN: [id number]  Date: 03/15/2013  DOB: Sep 30, 1940  Simulation Verification Note   NARRATIVE: The patient was brought to the treatment unit and placed in the planned treatment position. The clinical setup was verified. Then port films were obtained and uploaded to the radiation oncology medical record software.  The treatment beams were carefully compared against the planned radiation fields. The position, location, and shape of the radiation fields was reviewed. The targeted volume of tissue appears to be appropriately covered by the radiation beams. Based on my personal review, I approved the simulation verification. The patient's treatment will proceed as planned.  ________________________________   Radene Gunning, MD, PhD

## 2013-03-16 ENCOUNTER — Ambulatory Visit: Payer: Medicare Other

## 2013-03-19 ENCOUNTER — Ambulatory Visit
Admission: RE | Admit: 2013-03-19 | Discharge: 2013-03-19 | Disposition: A | Discharge status: Home / Self Care | Payer: Medicare Other | Source: Ambulatory Visit | Attending: Radiation Oncology | Admitting: Radiation Oncology

## 2013-03-19 ENCOUNTER — Ambulatory Visit: Payer: Medicare Other

## 2013-03-19 ENCOUNTER — Ambulatory Visit
Admission: RE | Admit: 2013-03-19 | Payer: Medicare Other | Source: Ambulatory Visit | Attending: Radiation Oncology | Admitting: Radiation Oncology

## 2013-03-20 ENCOUNTER — Ambulatory Visit: Payer: Medicare Other

## 2013-03-20 ENCOUNTER — Ambulatory Visit
Admission: RE | Admit: 2013-03-20 | Discharge: 2013-03-20 | Disposition: A | Discharge status: Home / Self Care | Payer: Medicare Other | Source: Ambulatory Visit | Attending: Radiation Oncology | Admitting: Radiation Oncology

## 2013-03-21 ENCOUNTER — Ambulatory Visit: Payer: BC Managed Care – PPO | Admitting: Psychiatry

## 2013-03-21 ENCOUNTER — Ambulatory Visit
Admission: RE | Admit: 2013-03-21 | Discharge: 2013-03-21 | Disposition: A | Discharge status: Home / Self Care | Payer: Medicare Other | Source: Ambulatory Visit | Attending: Radiation Oncology | Admitting: Radiation Oncology

## 2013-03-21 DIAGNOSIS — C50412 Malignant neoplasm of upper-outer quadrant of left female breast: Secondary | ICD-10-CM

## 2013-03-21 MED ORDER — ALRA NON-METALLIC DEODORANT (RAD-ONC)
1.0000 "application " | Freq: Once | TOPICAL | Status: AC
  Administered 2013-03-21: 1 via TOPICAL

## 2013-03-21 MED ORDER — RADIAPLEXRX EX GEL
Freq: Once | CUTANEOUS | Status: AC
  Administered 2013-03-21: 11:00:00 via TOPICAL

## 2013-03-21 NOTE — Progress Notes (Signed)
See doc flow sheets pt education done, teach back method

## 2013-03-22 ENCOUNTER — Ambulatory Visit
Admission: RE | Admit: 2013-03-22 | Discharge: 2013-03-22 | Disposition: A | Discharge status: Home / Self Care | Payer: Medicare Other | Source: Ambulatory Visit | Attending: Radiation Oncology | Admitting: Radiation Oncology

## 2013-03-23 ENCOUNTER — Ambulatory Visit (INDEPENDENT_AMBULATORY_CARE_PROVIDER_SITE_OTHER): Payer: Medicare Other | Admitting: Podiatry

## 2013-03-23 ENCOUNTER — Encounter: Payer: Self-pay | Admitting: Radiation Oncology

## 2013-03-23 ENCOUNTER — Ambulatory Visit
Admission: RE | Admit: 2013-03-23 | Discharge: 2013-03-23 | Disposition: A | Discharge status: Home / Self Care | Payer: Medicare Other | Source: Ambulatory Visit | Attending: Radiation Oncology | Admitting: Radiation Oncology

## 2013-03-23 ENCOUNTER — Encounter: Payer: Self-pay | Admitting: Podiatry

## 2013-03-23 VITALS — BP 133/71 | HR 98 | Temp 98.7°F | Resp 20 | Wt 308.0 lb

## 2013-03-23 VITALS — Ht 66.0 in | Wt 320.0 lb

## 2013-03-23 DIAGNOSIS — R609 Edema, unspecified: Secondary | ICD-10-CM

## 2013-03-23 DIAGNOSIS — C50412 Malignant neoplasm of upper-outer quadrant of left female breast: Secondary | ICD-10-CM

## 2013-03-23 DIAGNOSIS — M25579 Pain in unspecified ankle and joints of unspecified foot: Secondary | ICD-10-CM

## 2013-03-23 NOTE — Addendum Note (Signed)
Encounter addended by: Jonna Coup, MD on: 03/23/2013  6:09 PM<BR>     Documentation filed: Notes Section

## 2013-03-23 NOTE — Progress Notes (Signed)
Department of Radiation Oncology  Phone:  [phone] Fax:        [phone]  Weekly Treatment Note    Name: [name] Date: 03/23/2013 MRN: [id number] DOB: 10-17-1940   Current dose: 9 Gy  Current fraction: 5   MEDICATIONS: Current Outpatient Prescriptions  Medication Sig Dispense Refill  . acetaminophen-codeine (TYLENOL #3) 300-30 MG per tablet Take 1 tablet by mouth every 6 (six) hours as needed for pain.      Marland Kitchen ALPRAZolam (XANAX) 0.25 MG tablet Take 0.25 mg by mouth at bedtime as needed.       Marland Kitchen buPROPion (WELLBUTRIN XL) 150 MG 24 hr tablet Take 150 mg by mouth 2 (two) times daily.       . Calcium Carbonate-Vitamin D (CALTRATE 600+D) 600-400 MG-UNIT per tablet Take 1 tablet by mouth daily.      . carisoprodol (SOMA) 350 MG tablet Take 350 mg by mouth 2 (two) times daily.       . diclofenac (VOLTAREN) 75 MG EC tablet Take 75 mg by mouth 2 (two) times daily.       . Diclofenac Sodium (PENNSAID) 1.5 % SOLN Place 1 Bottle onto the skin daily as needed. For pain      . FIBER SELECT GUMMIES PO Take 2 tablets by mouth every morning.      . hyaluronate sodium (RADIAPLEXRX) GEL Apply 1 application topically 2 (two) times daily. Add to affected skin area after rad tx and in am after your shower/bath, just not 4 hours prior to coming for rad tx      . HYDROcodone-acetaminophen (NORCO) 10-325 MG per tablet Take 1-2 tablets by mouth every 6 (six) hours as needed.  30 tablet  0  . metoprolol succinate (TOPROL-XL) 50 MG 24 hr tablet Take 50 mg by mouth 2 (two) times daily.       . Multiple Vitamin (MULTIVITAMIN WITH MINERALS) TABS tablet Take 1 tablet by mouth daily.      . non-metallic deodorant Thornton Papas) MISC Apply 1 application topically daily as needed.      . RESTASIS 0.05 % ophthalmic emulsion Place 1 drop into both eyes 2 (two) times daily.       Marland Kitchen senna-docusate (SENOKOT-S) 8.6-50 MG per tablet Take 1 tablet by mouth daily.      . simvastatin (ZOCOR) 20 MG tablet Take 20 mg  by mouth daily.       . Vitamin D, Ergocalciferol, (DRISDOL) 50000 UNITS CAPS capsule Take 50,000 Units by mouth every 7 (seven) days.       No current facility-administered medications for this encounter.     ALLERGIES: Iodine   LABORATORY DATA:  Lab Results  Component Value Date   WBC 6.4 01/11/2013   HGB 11.7* 01/11/2013   HCT 35.9* 01/11/2013   MCV 78.0 01/11/2013   PLT 200 01/11/2013   Lab Results  Component Value Date   NA 139 01/05/2013   K 4.1 01/05/2013   CL 102 01/05/2013   CO2 26 01/05/2013   Lab Results  Component Value Date   ALT 21 03/31/2010   AST 21 03/31/2010   ALKPHOS 71 03/31/2010   BILITOT 0.7 03/31/2010     NARRATIVE: [name] was seen today for weekly treatment management. The chart was checked and the patient's films were reviewed. The patient has done well this week. No skin changes so far. Energy level about the same area at she has noted some shooting pains in the breast area.  PHYSICAL EXAMINATION: weight is 308 lb (139.708 kg). Her oral temperature is 98.7 F (37.1 C). Her blood pressure is 133/71 and her pulse is 98. Her respiration is 20.        ASSESSMENT: The patient is doing satisfactorily with treatment.  PLAN: We will continue with the patient's radiation treatment as planned.

## 2013-03-23 NOTE — Progress Notes (Signed)
Under oncology care for uterine and breast cancer. Having radiation treatment.  Patient came in via walker. Stated that she could not use compression socks due to discomfort.   Objective:  Patient is in good spirit.  Both pedal edema has decreased some degree. Still have 2+ edema and less pitting.  Has not been to water exercise. She could not make arrangement.   Assessment: Bilateral pedal and lower leg edema improving.   Plan: Continue to try to get compression socks.  Will check back in 4-6 weeks.

## 2013-03-23 NOTE — Progress Notes (Signed)
Weekly rad tx lt breast 5tx completed, using radiaplex bid,  Using radiaplex bid, no skin changes, skin intact No c/o pain 6:13 PM

## 2013-03-23 NOTE — Patient Instructions (Signed)
Follow up on bilateral leg edema. Seen some improvement.  Please continue to try to wear compression socks.  Return in 2 months.

## 2013-03-24 DIAGNOSIS — Z923 Personal history of irradiation: Secondary | ICD-10-CM

## 2013-03-24 HISTORY — DX: Personal history of irradiation: Z92.3

## 2013-03-26 ENCOUNTER — Ambulatory Visit
Admission: RE | Admit: 2013-03-26 | Discharge: 2013-03-26 | Disposition: A | Discharge status: Home / Self Care | Payer: Medicare Other | Source: Ambulatory Visit | Attending: Radiation Oncology | Admitting: Radiation Oncology

## 2013-03-27 ENCOUNTER — Ambulatory Visit (INDEPENDENT_AMBULATORY_CARE_PROVIDER_SITE_OTHER): Payer: Medicare Other | Admitting: Psychiatry

## 2013-03-27 ENCOUNTER — Ambulatory Visit
Admission: RE | Admit: 2013-03-27 | Discharge: 2013-03-27 | Disposition: A | Discharge status: Home / Self Care | Payer: Medicare Other | Source: Ambulatory Visit | Attending: Radiation Oncology | Admitting: Radiation Oncology

## 2013-03-27 DIAGNOSIS — F0630 Mood disorder due to known physiological condition, unspecified: Secondary | ICD-10-CM

## 2013-03-28 ENCOUNTER — Encounter: Payer: Self-pay | Admitting: Radiation Oncology

## 2013-03-28 ENCOUNTER — Ambulatory Visit
Admission: RE | Admit: 2013-03-28 | Discharge: 2013-03-28 | Disposition: A | Discharge status: Home / Self Care | Payer: Medicare Other | Source: Ambulatory Visit | Attending: Radiation Oncology | Admitting: Radiation Oncology

## 2013-03-28 VITALS — BP 119/76 | HR 87 | Temp 98.5°F | Ht 66.0 in | Wt 305.9 lb

## 2013-03-28 DIAGNOSIS — C50412 Malignant neoplasm of upper-outer quadrant of left female breast: Secondary | ICD-10-CM

## 2013-03-28 NOTE — Progress Notes (Signed)
Department of Radiation Oncology  Phone:  [phone] Fax:        [phone]  Weekly Treatment Note    Name: [name] Date: 03/28/2013 MRN: [id number] DOB: June 16, 1940   Current dose: 14.4 Gy  Current fraction: 8   MEDICATIONS: Current Outpatient Prescriptions  Medication Sig Dispense Refill  . acetaminophen-codeine (TYLENOL #3) 300-30 MG per tablet Take 1 tablet by mouth every 6 (six) hours as needed for pain.      Marland Kitchen ALPRAZolam (XANAX) 0.25 MG tablet Take 0.25 mg by mouth at bedtime as needed.       Marland Kitchen buPROPion (WELLBUTRIN XL) 150 MG 24 hr tablet Take 150 mg by mouth 2 (two) times daily.       . Calcium Carbonate-Vitamin D (CALTRATE 600+D) 600-400 MG-UNIT per tablet Take 1 tablet by mouth daily.      . diclofenac (VOLTAREN) 75 MG EC tablet Take 75 mg by mouth 2 (two) times daily.       . Diclofenac Sodium (PENNSAID) 1.5 % SOLN Place 1 Bottle onto the skin daily as needed. For pain      . FIBER SELECT GUMMIES PO Take 2 tablets by mouth every morning.      . hyaluronate sodium (RADIAPLEXRX) GEL Apply 1 application topically 2 (two) times daily. Add to affected skin area after rad tx and in am after your shower/bath, just not 4 hours prior to coming for rad tx      . HYDROcodone-acetaminophen (NORCO) 10-325 MG per tablet Take 1-2 tablets by mouth every 6 (six) hours as needed.  30 tablet  0  . metoprolol succinate (TOPROL-XL) 50 MG 24 hr tablet Take 50 mg by mouth 2 (two) times daily.       . Multiple Vitamin (MULTIVITAMIN WITH MINERALS) TABS tablet Take 1 tablet by mouth daily.      . non-metallic deodorant Thornton Papas) MISC Apply 1 application topically daily as needed.      . RESTASIS 0.05 % ophthalmic emulsion Place 1 drop into both eyes 2 (two) times daily.       . simvastatin (ZOCOR) 20 MG tablet Take 20 mg by mouth daily.       . Vitamin D, Ergocalciferol, (DRISDOL) 50000 UNITS CAPS capsule Take 50,000 Units by mouth every 7 (seven) days.      . carisoprodol (SOMA) 350  MG tablet Take 350 mg by mouth 2 (two) times daily.       Marland Kitchen senna-docusate (SENOKOT-S) 8.6-50 MG per tablet Take 1 tablet by mouth daily.       No current facility-administered medications for this encounter.     ALLERGIES: Iodine   LABORATORY DATA:  Lab Results  Component Value Date   WBC 6.4 01/11/2013   HGB 11.7* 01/11/2013   HCT 35.9* 01/11/2013   MCV 78.0 01/11/2013   PLT 200 01/11/2013   Lab Results  Component Value Date   NA 139 01/05/2013   K 4.1 01/05/2013   CL 102 01/05/2013   CO2 26 01/05/2013   Lab Results  Component Value Date   ALT 21 03/31/2010   AST 21 03/31/2010   ALKPHOS 71 03/31/2010   BILITOT 0.7 03/31/2010     NARRATIVE: [name] was seen today for weekly treatment management. The chart was checked and the patient's films were reviewed. The patient is doing satisfactorily with treatment. No real significant complaints at this point. A number of questions were answered regarding nutrition/hydration.  PHYSICAL EXAMINATION: height is 5\' 6"  (  1.676 m) and weight is 305 lb 14.4 oz (138.755 kg). Her temperature is 98.5 F (36.9 C). Her blood pressure is 119/76 and her pulse is 87.        ASSESSMENT: The patient is doing satisfactorily with treatment.  PLAN: We will continue with the patient's radiation treatment as planned.

## 2013-03-29 ENCOUNTER — Ambulatory Visit
Admission: RE | Admit: 2013-03-29 | Discharge: 2013-03-29 | Disposition: A | Discharge status: Home / Self Care | Payer: Medicare Other | Source: Ambulatory Visit | Attending: Radiation Oncology | Admitting: Radiation Oncology

## 2013-03-30 ENCOUNTER — Ambulatory Visit
Admission: RE | Admit: 2013-03-30 | Discharge: 2013-03-30 | Disposition: A | Discharge status: Home / Self Care | Payer: Medicare Other | Source: Ambulatory Visit | Attending: Radiation Oncology | Admitting: Radiation Oncology

## 2013-04-01 ENCOUNTER — Ambulatory Visit: Payer: Medicare Other

## 2013-04-02 ENCOUNTER — Encounter: Payer: Self-pay | Admitting: Radiation Oncology

## 2013-04-02 ENCOUNTER — Ambulatory Visit
Admission: RE | Admit: 2013-04-02 | Discharge: 2013-04-02 | Disposition: A | Discharge status: Home / Self Care | Payer: Medicare Other | Source: Ambulatory Visit | Attending: Radiation Oncology | Admitting: Radiation Oncology

## 2013-04-03 ENCOUNTER — Ambulatory Visit (INDEPENDENT_AMBULATORY_CARE_PROVIDER_SITE_OTHER): Payer: Medicare Other | Admitting: Psychiatry

## 2013-04-03 ENCOUNTER — Ambulatory Visit
Admission: RE | Admit: 2013-04-03 | Discharge: 2013-04-03 | Disposition: A | Discharge status: Home / Self Care | Payer: Medicare Other | Source: Ambulatory Visit | Attending: Radiation Oncology | Admitting: Radiation Oncology

## 2013-04-03 DIAGNOSIS — F0630 Mood disorder due to known physiological condition, unspecified: Secondary | ICD-10-CM

## 2013-04-04 ENCOUNTER — Encounter: Payer: Self-pay | Admitting: Specialist

## 2013-04-04 ENCOUNTER — Telehealth: Payer: Self-pay | Admitting: *Deleted

## 2013-04-04 ENCOUNTER — Ambulatory Visit
Admission: RE | Admit: 2013-04-04 | Discharge: 2013-04-04 | Disposition: A | Discharge status: Home / Self Care | Payer: Medicare Other | Source: Ambulatory Visit | Attending: Radiation Oncology | Admitting: Radiation Oncology

## 2013-04-04 NOTE — Telephone Encounter (Signed)
Received a phone call from Behavioral Medicine and they told me that this patient has an appt. With them on 04-17-13 at 2 pm, spoke with patient and she is aware of this appt.

## 2013-04-04 NOTE — Progress Notes (Signed)
Patient called, seemingly confused about a call she had received to schedule an appointment.  I called Rose in Scheduling, and Okey Dupre indicated she would contact Mrs. Cazeau.

## 2013-04-05 ENCOUNTER — Ambulatory Visit
Admission: RE | Admit: 2013-04-05 | Discharge: 2013-04-05 | Disposition: A | Discharge status: Home / Self Care | Payer: Medicare Other | Source: Ambulatory Visit | Attending: Radiation Oncology | Admitting: Radiation Oncology

## 2013-04-06 ENCOUNTER — Ambulatory Visit
Admission: RE | Admit: 2013-04-06 | Discharge: 2013-04-06 | Disposition: A | Discharge status: Home / Self Care | Payer: Medicare Other | Source: Ambulatory Visit | Attending: Radiation Oncology | Admitting: Radiation Oncology

## 2013-04-06 ENCOUNTER — Encounter: Payer: Self-pay | Admitting: Radiation Oncology

## 2013-04-06 VITALS — BP 140/84 | HR 96 | Temp 97.5°F | Resp 20 | Wt 311.5 lb

## 2013-04-06 DIAGNOSIS — C50412 Malignant neoplasm of upper-outer quadrant of left female breast: Secondary | ICD-10-CM

## 2013-04-06 NOTE — Progress Notes (Signed)
Weekly rad txs lt breast 15/28 completed slight erythema, skin intact, no c/o pain, using radiaplex bid 4:11 PM

## 2013-04-07 NOTE — Progress Notes (Signed)
Department of Radiation Oncology  Phone:  [phone] Fax:        [phone]  Weekly Treatment Note    Name: COPELYN WIDMER Date: 04/07/2013 MRN: [id number] DOB: 02-Jan-1941   Current dose: 27 Gy  Current fraction: 15   MEDICATIONS: Current Outpatient Prescriptions  Medication Sig Dispense Refill  . acetaminophen-codeine (TYLENOL #3) 300-30 MG per tablet Take 1 tablet by mouth every 6 (six) hours as needed for pain.      Marland Kitchen ALPRAZolam (XANAX) 0.25 MG tablet Take 0.25 mg by mouth at bedtime as needed.       Marland Kitchen buPROPion (WELLBUTRIN XL) 150 MG 24 hr tablet Take 150 mg by mouth 2 (two) times daily.       . Calcium Carbonate-Vitamin D (CALTRATE 600+D) 600-400 MG-UNIT per tablet Take 1 tablet by mouth daily.      . carisoprodol (SOMA) 350 MG tablet Take 350 mg by mouth 2 (two) times daily.       . Diclofenac Sodium (PENNSAID) 1.5 % SOLN Place 1 Bottle onto the skin daily as needed. For pain      . FIBER SELECT GUMMIES PO Take 2 tablets by mouth every morning.      . hyaluronate sodium (RADIAPLEXRX) GEL Apply 1 application topically 2 (two) times daily. Add to affected skin area after rad tx and in am after your shower/bath, just not 4 hours prior to coming for rad tx      . HYDROcodone-acetaminophen (NORCO) 10-325 MG per tablet Take 1-2 tablets by mouth every 6 (six) hours as needed.  30 tablet  0  . metoprolol succinate (TOPROL-XL) 50 MG 24 hr tablet Take 50 mg by mouth 2 (two) times daily.       . Multiple Vitamin (MULTIVITAMIN WITH MINERALS) TABS tablet Take 1 tablet by mouth daily.      . non-metallic deodorant Thornton Papas) MISC Apply 1 application topically daily as needed.      . RESTASIS 0.05 % ophthalmic emulsion Place 1 drop into both eyes 2 (two) times daily.       . Vitamin D, Ergocalciferol, (DRISDOL) 50000 UNITS CAPS capsule Take 50,000 Units by mouth every 7 (seven) days.       No current facility-administered medications for this encounter.     ALLERGIES:  Iodine   LABORATORY DATA:  Lab Results  Component Value Date   WBC 6.4 01/11/2013   HGB 11.7* 01/11/2013   HCT 35.9* 01/11/2013   MCV 78.0 01/11/2013   PLT 200 01/11/2013   Lab Results  Component Value Date   NA 139 01/05/2013   K 4.1 01/05/2013   CL 102 01/05/2013   CO2 26 01/05/2013   Lab Results  Component Value Date   ALT 21 03/31/2010   AST 21 03/31/2010   ALKPHOS 71 03/31/2010   BILITOT 0.7 03/31/2010     NARRATIVE: PERLA ECHAVARRIA was seen today for weekly treatment management. The chart was checked and the patient's films were reviewed. The patient is doing well with treatment she states. She has not noticed any major change in her scan.  PHYSICAL EXAMINATION: weight is 311 lb 8 oz (141.295 kg). Her oral temperature is 97.5 F (36.4 C). Her blood pressure is 140/84 and her pulse is 96. Her respiration is 20.      some emerging erythema/early hyperpigmentation in the treatment region. No desquamation. Overall her skin looks good.  ASSESSMENT: The patient is doing satisfactorily with treatment.  PLAN: We will continue with  the patient's radiation treatment as planned.

## 2013-04-09 ENCOUNTER — Ambulatory Visit
Admission: RE | Admit: 2013-04-09 | Discharge: 2013-04-09 | Disposition: A | Discharge status: Home / Self Care | Payer: Medicare Other | Source: Ambulatory Visit | Attending: Radiation Oncology | Admitting: Radiation Oncology

## 2013-04-10 ENCOUNTER — Ambulatory Visit
Admission: RE | Admit: 2013-04-10 | Discharge: 2013-04-10 | Disposition: A | Discharge status: Home / Self Care | Payer: Medicare Other | Source: Ambulatory Visit | Attending: Radiation Oncology | Admitting: Radiation Oncology

## 2013-04-11 ENCOUNTER — Ambulatory Visit
Admission: RE | Admit: 2013-04-11 | Discharge: 2013-04-11 | Disposition: A | Discharge status: Home / Self Care | Payer: Medicare Other | Source: Ambulatory Visit | Attending: Radiation Oncology | Admitting: Radiation Oncology

## 2013-04-12 ENCOUNTER — Ambulatory Visit
Admission: RE | Admit: 2013-04-12 | Discharge: 2013-04-12 | Disposition: A | Discharge status: Home / Self Care | Payer: Medicare Other | Source: Ambulatory Visit | Attending: Radiation Oncology | Admitting: Radiation Oncology

## 2013-04-13 ENCOUNTER — Ambulatory Visit: Payer: Medicare Other

## 2013-04-13 ENCOUNTER — Encounter: Payer: Self-pay | Admitting: Radiation Oncology

## 2013-04-13 ENCOUNTER — Ambulatory Visit
Admission: RE | Admit: 2013-04-13 | Discharge: 2013-04-13 | Disposition: A | Discharge status: Home / Self Care | Payer: Medicare Other | Source: Ambulatory Visit | Attending: Radiation Oncology | Admitting: Radiation Oncology

## 2013-04-13 VITALS — BP 120/84 | HR 96 | Temp 97.7°F | Ht 66.0 in | Wt 309.5 lb

## 2013-04-13 DIAGNOSIS — C50412 Malignant neoplasm of upper-outer quadrant of left female breast: Secondary | ICD-10-CM

## 2013-04-13 NOTE — Progress Notes (Signed)
Department of Radiation Oncology  Phone:  [phone] Fax:        [phone]  Weekly Treatment Note    Name: [name] Date: 04/13/2013 MRN: [id number] DOB: 1941/01/09   Current dose: 36 Gy  Current fraction: 20   MEDICATIONS: Current Outpatient Prescriptions  Medication Sig Dispense Refill  . acetaminophen-codeine (TYLENOL #3) 300-30 MG per tablet Take 1 tablet by mouth every 6 (six) hours as needed for pain.      Marland Kitchen ALPRAZolam (XANAX) 0.25 MG tablet Take 0.25 mg by mouth at bedtime as needed.       Marland Kitchen buPROPion (WELLBUTRIN XL) 150 MG 24 hr tablet Take 150 mg by mouth 2 (two) times daily.       . Calcium Carbonate-Vitamin D (CALTRATE 600+D) 600-400 MG-UNIT per tablet Take 1 tablet by mouth daily.      . carisoprodol (SOMA) 350 MG tablet Take 350 mg by mouth 2 (two) times daily.       . Diclofenac Sodium (PENNSAID) 1.5 % SOLN Place 1 Bottle onto the skin daily as needed. For pain      . FIBER SELECT GUMMIES PO Take 2 tablets by mouth every morning.      . hyaluronate sodium (RADIAPLEXRX) GEL Apply 1 application topically 2 (two) times daily. Add to affected skin area after rad tx and in am after your shower/bath, just not 4 hours prior to coming for rad tx      . HYDROcodone-acetaminophen (NORCO) 10-325 MG per tablet Take 1-2 tablets by mouth every 6 (six) hours as needed.  30 tablet  0  . metoprolol succinate (TOPROL-XL) 50 MG 24 hr tablet Take 50 mg by mouth 2 (two) times daily.       . Multiple Vitamin (MULTIVITAMIN WITH MINERALS) TABS tablet Take 1 tablet by mouth daily.      . non-metallic deodorant Thornton Papas) MISC Apply 1 application topically daily as needed.      . RESTASIS 0.05 % ophthalmic emulsion Place 1 drop into both eyes 2 (two) times daily.       . Vitamin D, Ergocalciferol, (DRISDOL) 50000 UNITS CAPS capsule Take 50,000 Units by mouth every 7 (seven) days.       No current facility-administered medications for this encounter.     ALLERGIES:  Iodine   LABORATORY DATA:  Lab Results  Component Value Date   WBC 6.4 01/11/2013   HGB 11.7* 01/11/2013   HCT 35.9* 01/11/2013   MCV 78.0 01/11/2013   PLT 200 01/11/2013   Lab Results  Component Value Date   NA 139 01/05/2013   K 4.1 01/05/2013   CL 102 01/05/2013   CO2 26 01/05/2013   Lab Results  Component Value Date   ALT 21 03/31/2010   AST 21 03/31/2010   ALKPHOS 71 03/31/2010   BILITOT 0.7 03/31/2010     NARRATIVE: [name] was seen today for weekly treatment management. The chart was checked and the patient's films were reviewed. The patient states that she has done relatively well overall. Some skin irritation, especially in the axillary region. She does complain of some fatigue.  PHYSICAL EXAMINATION: height is 5\' 6"  (1.676 m) and weight is 309 lb 8 oz (140.388 kg). Her temperature is 97.7 F (36.5 C). Her blood pressure is 120/84 and her pulse is 96.      the patient's skin actually is holding up quite well. Some thinning/increased darkening in the inframammary region and the left axilla. We should be  mostly off of these areas when she begins her boost treatment.  ASSESSMENT: The patient is doing satisfactorily with treatment.  PLAN: We will continue with the patient's radiation treatment as planned. The patient did express an interest in talking to her navigator so I will contact them regarding this issue.

## 2013-04-13 NOTE — Progress Notes (Signed)
[name] has received 20 fractions to her left breast. Note erythema of the anterior breast, inframmary fold and the lower axillary region.  Skin soft and intact.  She reports increased tenderness in the upper lateral breast/axillary region and grades this as a level 6 on a scale of 0-10.  She also reports increased fatigue.  Travel by wheelchair.

## 2013-04-15 ENCOUNTER — Ambulatory Visit
Admission: RE | Admit: 2013-04-15 | Discharge: 2013-04-15 | Disposition: A | Discharge status: Home / Self Care | Payer: Medicare Other | Source: Ambulatory Visit | Attending: Radiation Oncology | Admitting: Radiation Oncology

## 2013-04-16 ENCOUNTER — Ambulatory Visit: Payer: Medicare Other

## 2013-04-16 ENCOUNTER — Ambulatory Visit
Admission: RE | Admit: 2013-04-16 | Discharge: 2013-04-16 | Disposition: A | Discharge status: Home / Self Care | Payer: Medicare Other | Source: Ambulatory Visit | Attending: Radiation Oncology | Admitting: Radiation Oncology

## 2013-04-16 NOTE — Progress Notes (Signed)
Saw patient  In dressing room under inframmary fold 1/4 inch skin peeling, gave neosporin sample packets,  and applied to that area, will seeMD on Monday,  4:16 PM

## 2013-04-17 ENCOUNTER — Ambulatory Visit
Admission: RE | Admit: 2013-04-17 | Discharge: 2013-04-17 | Disposition: A | Discharge status: Home / Self Care | Payer: Medicare Other | Source: Ambulatory Visit | Attending: Radiation Oncology | Admitting: Radiation Oncology

## 2013-04-17 ENCOUNTER — Ambulatory Visit (INDEPENDENT_AMBULATORY_CARE_PROVIDER_SITE_OTHER): Payer: Medicare Other | Admitting: Psychiatry

## 2013-04-17 ENCOUNTER — Ambulatory Visit: Payer: Medicare Other

## 2013-04-17 DIAGNOSIS — F0630 Mood disorder due to known physiological condition, unspecified: Secondary | ICD-10-CM

## 2013-04-18 ENCOUNTER — Ambulatory Visit
Admission: RE | Admit: 2013-04-18 | Discharge: 2013-04-18 | Disposition: A | Discharge status: Home / Self Care | Payer: Medicare Other | Source: Ambulatory Visit | Attending: Radiation Oncology | Admitting: Radiation Oncology

## 2013-04-19 ENCOUNTER — Ambulatory Visit: Payer: Medicare Other

## 2013-04-20 ENCOUNTER — Ambulatory Visit: Payer: Medicare Other

## 2013-04-23 ENCOUNTER — Ambulatory Visit
Admission: RE | Admit: 2013-04-23 | Discharge: 2013-04-23 | Disposition: A | Discharge status: Home / Self Care | Payer: Medicare Other | Source: Ambulatory Visit | Attending: Radiation Oncology | Admitting: Radiation Oncology

## 2013-04-23 DIAGNOSIS — C50412 Malignant neoplasm of upper-outer quadrant of left female breast: Secondary | ICD-10-CM

## 2013-04-23 MED ORDER — RADIAPLEXRX EX GEL
Freq: Once | CUTANEOUS | Status: AC
  Administered 2013-04-23: 16:00:00 via TOPICAL

## 2013-04-24 ENCOUNTER — Ambulatory Visit: Payer: Medicare Other

## 2013-04-24 ENCOUNTER — Ambulatory Visit
Admission: RE | Admit: 2013-04-24 | Discharge: 2013-04-24 | Disposition: A | Discharge status: Home / Self Care | Payer: Medicare Other | Source: Ambulatory Visit | Attending: Radiation Oncology | Admitting: Radiation Oncology

## 2013-04-25 ENCOUNTER — Ambulatory Visit: Payer: Medicare Other

## 2013-04-25 ENCOUNTER — Ambulatory Visit
Admission: RE | Admit: 2013-04-25 | Discharge: 2013-04-25 | Disposition: A | Discharge status: Home / Self Care | Payer: Medicare Other | Source: Ambulatory Visit | Attending: Radiation Oncology | Admitting: Radiation Oncology

## 2013-04-26 ENCOUNTER — Ambulatory Visit
Admission: RE | Admit: 2013-04-26 | Discharge: 2013-04-26 | Disposition: A | Discharge status: Home / Self Care | Payer: Medicare Other | Source: Ambulatory Visit | Attending: Radiation Oncology | Admitting: Radiation Oncology

## 2013-04-26 ENCOUNTER — Ambulatory Visit: Payer: Medicare Other

## 2013-04-26 ENCOUNTER — Encounter: Payer: Self-pay | Admitting: Radiation Oncology

## 2013-04-26 VITALS — BP 147/81 | HR 96 | Temp 97.9°F | Resp 20 | Wt 309.0 lb

## 2013-04-26 DIAGNOSIS — C50412 Malignant neoplasm of upper-outer quadrant of left female breast: Secondary | ICD-10-CM

## 2013-04-26 NOTE — Progress Notes (Signed)
WEEKLY RAD TXS 28/3 LEFT BREAST, dry desquamation under axilla, peeling, hyperpigmentation  under inframmary fold, skin open moist, using neosporin there and biafine cream on breast, fatigued, pain 4-5 on 1-10 scale, gave more neosporin packets  4:33 PM

## 2013-04-26 NOTE — Progress Notes (Signed)
Department of Radiation Oncology  Phone:  [phone] Fax:        [phone]  Weekly Treatment Note    Name: [name] Date: 04/26/2013 MRN: [id number] DOB: May 21, 1941   Current dose: 50.4 Gy  Current fraction: 28   MEDICATIONS: Current Outpatient Prescriptions  Medication Sig Dispense Refill  . acetaminophen-codeine (TYLENOL #3) 300-30 MG per tablet Take 1 tablet by mouth every 6 (six) hours as needed for pain.      Marland Kitchen ALPRAZolam (XANAX) 0.25 MG tablet Take 0.25 mg by mouth at bedtime as needed.       Marland Kitchen buPROPion (WELLBUTRIN XL) 150 MG 24 hr tablet Take 150 mg by mouth 2 (two) times daily.       . Calcium Carbonate-Vitamin D (CALTRATE 600+D) 600-400 MG-UNIT per tablet Take 1 tablet by mouth daily.      . carisoprodol (SOMA) 350 MG tablet Take 350 mg by mouth 2 (two) times daily.       . Diclofenac Sodium (PENNSAID) 1.5 % SOLN Place 1 Bottle onto the skin daily as needed. For pain      . FIBER SELECT GUMMIES PO Take 2 tablets by mouth every morning.      . hyaluronate sodium (RADIAPLEXRX) GEL Apply 1 application topically 2 (two) times daily. Add to affected skin area after rad tx and in am after your shower/bath, just not 4 hours prior to coming for rad tx      . HYDROcodone-acetaminophen (NORCO) 10-325 MG per tablet Take 1-2 tablets by mouth every 6 (six) hours as needed.  30 tablet  0  . metoprolol succinate (TOPROL-XL) 50 MG 24 hr tablet Take 50 mg by mouth 2 (two) times daily.       . Multiple Vitamin (MULTIVITAMIN WITH MINERALS) TABS tablet Take 1 tablet by mouth daily.      . non-metallic deodorant Thornton Papas) MISC Apply 1 application topically daily as needed.      . RESTASIS 0.05 % ophthalmic emulsion Place 1 drop into both eyes 2 (two) times daily.       . Vitamin D, Ergocalciferol, (DRISDOL) 50000 UNITS CAPS capsule Take 50,000 Units by mouth every 7 (seven) days.       No current facility-administered medications for this encounter.     ALLERGIES:  Iodine   LABORATORY DATA:  Lab Results  Component Value Date   WBC 6.4 01/11/2013   HGB 11.7* 01/11/2013   HCT 35.9* 01/11/2013   MCV 78.0 01/11/2013   PLT 200 01/11/2013   Lab Results  Component Value Date   NA 139 01/05/2013   K 4.1 01/05/2013   CL 102 01/05/2013   CO2 26 01/05/2013   Lab Results  Component Value Date   ALT 21 03/31/2010   AST 21 03/31/2010   ALKPHOS 71 03/31/2010   BILITOT 0.7 03/31/2010     NARRATIVE: [name] was seen today for weekly treatment management. The chart was checked and the patient's films were reviewed. The patient complains of increase in skin irritation. This is present especially she states underneath the arm.  PHYSICAL EXAMINATION: weight is 309 lb (140.161 kg). Her oral temperature is 97.9 F (36.6 C). Her blood pressure is 147/81 and her pulse is 96. Her respiration is 20.      dry desquamation in the upper axilla. She has a small amount of moist desquamation emerging in the inframammary region. Otherwise her skin looks good.  ASSESSMENT: The patient is doing satisfactorily with treatment.  PLAN: We will continue with the patient's radiation treatment as planned. She will use Neosporin on the affected areas. She will also try "Neosporin with Pain" which I believe will help the area underneath the breast in terms of some additional relief. She will begin her boost treatment tomorrow.

## 2013-04-27 ENCOUNTER — Ambulatory Visit: Payer: Medicare Other

## 2013-04-27 ENCOUNTER — Ambulatory Visit
Admission: RE | Admit: 2013-04-27 | Discharge: 2013-04-27 | Disposition: A | Discharge status: Home / Self Care | Payer: Medicare Other | Source: Ambulatory Visit | Attending: Radiation Oncology | Admitting: Radiation Oncology

## 2013-04-27 ENCOUNTER — Ambulatory Visit: Payer: Medicare Other | Admitting: Radiation Oncology

## 2013-04-27 DIAGNOSIS — C50412 Malignant neoplasm of upper-outer quadrant of left female breast: Secondary | ICD-10-CM

## 2013-04-29 ENCOUNTER — Ambulatory Visit: Payer: Medicare Other

## 2013-04-30 ENCOUNTER — Encounter: Payer: Self-pay | Admitting: *Deleted

## 2013-04-30 ENCOUNTER — Ambulatory Visit: Payer: Medicare Other

## 2013-04-30 ENCOUNTER — Ambulatory Visit
Admission: RE | Admit: 2013-04-30 | Discharge: 2013-04-30 | Disposition: A | Discharge status: Home / Self Care | Payer: Medicare Other | Source: Ambulatory Visit | Attending: Radiation Oncology | Admitting: Radiation Oncology

## 2013-04-30 ENCOUNTER — Encounter (INDEPENDENT_AMBULATORY_CARE_PROVIDER_SITE_OTHER): Payer: Self-pay | Admitting: General Surgery

## 2013-04-30 ENCOUNTER — Ambulatory Visit (INDEPENDENT_AMBULATORY_CARE_PROVIDER_SITE_OTHER): Payer: Medicare Other | Admitting: General Surgery

## 2013-04-30 ENCOUNTER — Telehealth: Payer: Self-pay | Admitting: *Deleted

## 2013-04-30 VITALS — BP 122/74 | HR 84 | Resp 18 | Ht 67.0 in | Wt 310.0 lb

## 2013-04-30 DIAGNOSIS — C50412 Malignant neoplasm of upper-outer quadrant of left female breast: Secondary | ICD-10-CM

## 2013-04-30 DIAGNOSIS — C50419 Malignant neoplasm of upper-outer quadrant of unspecified female breast: Secondary | ICD-10-CM

## 2013-04-30 MED ORDER — SILVER SULFADIAZINE 1 % EX CREA
TOPICAL_CREAM | Freq: Two times a day (BID) | CUTANEOUS | Status: DC
  Administered 2013-04-30: 16:00:00 via TOPICAL

## 2013-04-30 MED ORDER — RADIAPLEXRX EX GEL
Freq: Once | CUTANEOUS | Status: AC
  Administered 2013-04-30: 16:00:00 via TOPICAL

## 2013-04-30 NOTE — Telephone Encounter (Signed)
Son called stating his mother seen by Dr.Wakefield's ofice today and will f/u in 6 months,but suggested she be seen by Dr.Moody  With the moist desquamation under inframmary fold, will check patient skin again today, asked Dr.Moody if we should switch to silvadene  ,patient has been using neosporin in that region, she is to start the boost on left breast today, also that Dr.Wakefield will refer her to physical therapy group soon 3:00 PM

## 2013-04-30 NOTE — Progress Notes (Signed)
Subjective:     Patient ID: [name], female   DOB: 26-Apr-1941, 72 y.o.   MRN: [id number]  HPI This is a 72 year old female who underwent a left breast lumpectomy and sentinel mode biopsy. She did well after that. She had a lot of issues mostly due to poor social situation and her nonambulatory nature to begin with. She has had an oncotype which is low and is just going to be on antiestrogen therapy once she completes her radiation therapy. She currently is undergoing radiation therapy and has had a lot of side effects in terms of her skin from that. This is really her only complaint today. I can't really examine her today she is still undergoing radiation.  Review of Systems     Objective:   Physical Exam    left breast with desquamation of her skin in left uoq and in lower portion of breast, no infection, skin changes secondary to radiation therapy, some lue swelling and decreased rom Assessment:     Stage I left breast cancer     Plan:     I cant examine her all that well today. She's having some pretty significant side effects from her radiation. She is going to see Dr. Mitzi Hansen again later today. I am concerned however about her arm in terms of lymphedema as well as a range of motion. I will refer her to see the physical therapy group soon. I will plan on seeing her back in 6 months. She is due to see medical oncology when she completed radiation therapy.

## 2013-04-30 NOTE — Progress Notes (Signed)
Saw pat9ient and son before rad tx today, under her left inframmay fold there is a new open area of peeled skin, 3 in long 1 in wide, moist desquamation, under axilla still just dry desquamation, silvadene cream given with printed instructions how to use bid, after rad tx and in am after bath, to remove any cream there before re-applying, gave telfa pads to use under breast,  also gave another radiaplex gel, will see her again this Thursday last treatment, and will re evaluate need for more silvadene cream, son asked how long should she continue the radiaplex gel once finished with her treatment, , informed both untilk she runs out of the new readiaplex gel then can switch to a lotion that has vitamin e in it, verbal understanding, escorted them to the waiting area and went to inform linac #2, therapist that she is in waiting room here, saw Dawn, navigator who had tube top stretch for patient, offerred to take to the patient who was asking earlier where she could get one, gave product to the patient and informed her that Dawn went to 5 different places for this and it is machine washable, they only had one Patient thanked [place] for this 3:46 PM

## 2013-05-01 ENCOUNTER — Ambulatory Visit
Admission: RE | Admit: 2013-05-01 | Discharge: 2013-05-01 | Disposition: A | Discharge status: Home / Self Care | Payer: Medicare Other | Source: Ambulatory Visit | Attending: Radiation Oncology | Admitting: Radiation Oncology

## 2013-05-01 ENCOUNTER — Ambulatory Visit: Payer: Medicare Other

## 2013-05-02 ENCOUNTER — Ambulatory Visit
Admission: RE | Admit: 2013-05-02 | Discharge: 2013-05-02 | Disposition: A | Discharge status: Home / Self Care | Payer: Medicare Other | Source: Ambulatory Visit | Attending: Radiation Oncology | Admitting: Radiation Oncology

## 2013-05-02 ENCOUNTER — Ambulatory Visit: Payer: Medicare Other

## 2013-05-02 DIAGNOSIS — C50412 Malignant neoplasm of upper-outer quadrant of left female breast: Secondary | ICD-10-CM

## 2013-05-02 MED ORDER — SILVER SULFADIAZINE 1 % EX CREA
TOPICAL_CREAM | Freq: Two times a day (BID) | CUTANEOUS | Status: DC
  Administered 2013-05-02: 10:00:00 via TOPICAL

## 2013-05-03 ENCOUNTER — Encounter: Payer: Self-pay | Admitting: Radiation Oncology

## 2013-05-03 ENCOUNTER — Ambulatory Visit: Payer: Medicare Other

## 2013-05-03 ENCOUNTER — Ambulatory Visit
Admission: RE | Admit: 2013-05-03 | Discharge: 2013-05-03 | Disposition: A | Discharge status: Home / Self Care | Payer: Medicare Other | Source: Ambulatory Visit | Attending: Radiation Oncology | Admitting: Radiation Oncology

## 2013-05-03 ENCOUNTER — Telehealth: Payer: Self-pay | Admitting: *Deleted

## 2013-05-03 VITALS — BP 107/68 | HR 115 | Temp 98.8°F | Resp 20

## 2013-05-03 DIAGNOSIS — C50412 Malignant neoplasm of upper-outer quadrant of left female breast: Secondary | ICD-10-CM

## 2013-05-03 NOTE — Progress Notes (Signed)
Rad txs lt breat 33/33 completed, dry desquamation under axilla towards outer breast, under inframmary  Fold wide spread peeling moist desquamation, using silvadene under breast,gave 2 more jars of that yesterday, patient appetite good, pain ful breast area, using neosporin under axilla and radiaplex gel dry breast area, follow up 2 weeks card given eto patient 4:17 PM

## 2013-05-03 NOTE — Telephone Encounter (Signed)
Pt called to state she is having difficulty finding a bra after lumpectomy to give the appropriate support since she is unable to wear a "normal" post lumpectomy bra d/t scaling and peeling from s/e of xrt.  Pt relayed she needed something without straps.  She requested a binder similar to what she went home with after surgery.  This RN called both [facility] [employer] and [employer] supply, neither facility carried the item.  I called [facility] OR, they had the binder which I went to acquire for the patient.  The patient came in for radiation treatment and was given the binder by Dr. Joellen Jersey nurse.

## 2013-05-03 NOTE — Progress Notes (Signed)
Complex simulation note  Diagnosis: Breast cancer  Narrative The patient has initially been planned to receive a course of whole breast radiation to a dose of 50.4 gray in 28 fractions at 1.8 gray per fraction. The patient will now receive an additional boost to the seroma cavity which has been contoured. This will correspond to a boost of 10 gray in 5 fractions at 2 gray per fraction. To accomplish this, an additional 4 customized blocks have been designed for this purpose. A complex isodose plan is requested to ensure that the target area is adequately covered with radiation dose and that the nearby normal structures such as the lung are adequately spared. The patient's final total dose will be 60.4 gray.  ------------------------------------------------  [name] S. Sherry [name], MD, PhD    

## 2013-05-03 NOTE — Progress Notes (Signed)
Department of Radiation Oncology  Phone:  [phone] Fax:        [phone]  Weekly Treatment Note    Name: [name] Date: 05/03/2013 MRN: [id number] DOB: 08/18/1940   Current dose: 60.4 Gy  Current fraction: 33   MEDICATIONS: Current Outpatient Prescriptions  Medication Sig Dispense Refill  . acetaminophen-codeine (TYLENOL #3) 300-30 MG per tablet Take 1 tablet by mouth every 6 (six) hours as needed for pain.      Marland Kitchen ALPRAZolam (XANAX) 0.25 MG tablet Take 0.25 mg by mouth at bedtime as needed.       Marland Kitchen buPROPion (WELLBUTRIN XL) 150 MG 24 hr tablet Take 150 mg by mouth 2 (two) times daily.       . Calcium Carbonate-Vitamin D (CALTRATE 600+D) 600-400 MG-UNIT per tablet Take 1 tablet by mouth daily.      . carisoprodol (SOMA) 350 MG tablet Take 350 mg by mouth 2 (two) times daily.       . Diclofenac Sodium (PENNSAID) 1.5 % SOLN Place 1 Bottle onto the skin daily as needed. For pain      . FIBER SELECT GUMMIES PO Take 2 tablets by mouth every morning.      . hyaluronate sodium (RADIAPLEXRX) GEL Apply 1 application topically 2 (two) times daily. Add to affected skin area after rad tx and in am after your shower/bath, just not 4 hours prior to coming for rad tx      . HYDROcodone-acetaminophen (NORCO) 10-325 MG per tablet Take 1-2 tablets by mouth every 6 (six) hours as needed.  30 tablet  0  . metoprolol succinate (TOPROL-XL) 50 MG 24 hr tablet Take 50 mg by mouth 2 (two) times daily.       . Multiple Vitamin (MULTIVITAMIN WITH MINERALS) TABS tablet Take 1 tablet by mouth daily.      . non-metallic deodorant Thornton Papas) MISC Apply 1 application topically daily as needed.      . RESTASIS 0.05 % ophthalmic emulsion Place 1 drop into both eyes 2 (two) times daily.       . silver sulfADIAZINE (SILVADENE) 1 % cream Apply 1 application topically 2 (two) times daily. Apply 2x day, after da tx and bedtime, must remove cream before re-applying to affected skin area      . Vitamin D,  Ergocalciferol, (DRISDOL) 50000 UNITS CAPS capsule Take 50,000 Units by mouth every 7 (seven) days.       No current facility-administered medications for this encounter.   Facility-Administered Medications Ordered in Other Encounters  Medication Dose Route Frequency Provider Last Rate Last Dose  . silver sulfADIAZINE (SILVADENE) 1 % cream   Topical BID Jonna Coup, MD      . silver sulfADIAZINE (SILVADENE) 1 % cream   Topical BID Jonna Coup, MD         ALLERGIES: Iodine   LABORATORY DATA:  Lab Results  Component Value Date   WBC 6.4 01/11/2013   HGB 11.7* 01/11/2013   HCT 35.9* 01/11/2013   MCV 78.0 01/11/2013   PLT 200 01/11/2013   Lab Results  Component Value Date   NA 139 01/05/2013   K 4.1 01/05/2013   CL 102 01/05/2013   CO2 26 01/05/2013   Lab Results  Component Value Date   ALT 21 03/31/2010   AST 21 03/31/2010   ALKPHOS 71 03/31/2010   BILITOT 0.7 03/31/2010     NARRATIVE: [name] was seen today for weekly treatment management. The chart was  checked and the patient's films were reviewed. The patient finished her final treatment today. Continued significant skin irritation. She has a fairly large area of moist desquamation in the inframammary region. She began using Silvadene on this area a couple days ago. This is somewhat sooner than.  PHYSICAL EXAMINATION: oral temperature is 98.8 F (37.1 C). Her blood pressure is 107/68 and her pulse is 115. Her respiration is 20.      fairly large area of moist desquamation in the inframammary region. Dry desquamation present in the axilla  ASSESSMENT: The patient did satisfactorily with treatment with some significant skin irritation towards the end of treatment. She has begun using Silvadene cream. I have also recommended that she began using Domeboro soaks as well on this area in an alternating fashion.     PLAN: The patient will follow-up in our clinic in 1 month.  We will see her a little earlier than normal in  3 weeks. She knows to contact our office sooner if needed.

## 2013-05-04 ENCOUNTER — Ambulatory Visit: Payer: Medicare Other

## 2013-05-07 ENCOUNTER — Telehealth: Payer: Self-pay | Admitting: *Deleted

## 2013-05-07 ENCOUNTER — Ambulatory Visit: Payer: Medicare Other

## 2013-05-07 NOTE — Telephone Encounter (Signed)
Spoke to pt concerning other support bra like items for breast.  Gave pt other stores to go to and see if these binders would help give her the correct support.  Pt denies further needs at this time.  Encourage pt to call with questions.  Contact information given.

## 2013-05-08 ENCOUNTER — Ambulatory Visit (INDEPENDENT_AMBULATORY_CARE_PROVIDER_SITE_OTHER): Payer: Medicare Other | Admitting: Psychiatry

## 2013-05-08 ENCOUNTER — Ambulatory Visit: Payer: Medicare Other

## 2013-05-08 DIAGNOSIS — F0630 Mood disorder due to known physiological condition, unspecified: Secondary | ICD-10-CM

## 2013-05-09 ENCOUNTER — Ambulatory Visit: Payer: Medicare Other

## 2013-05-09 NOTE — Progress Notes (Signed)
  Radiation Oncology         [phone] ________________________________  Name: [name] MRN: [id number]  Date: 05/03/2013  DOB: Aug 23, 1940  End of Treatment Note  Diagnosis:   Invasive ductal carcinoma of the left breast     Indication for treatment:  Curative       Radiation treatment dates:   03/19/2013 through 05/03/2013  Site/dose:   The patient was initially treated to the left breast using a whole breast tangent technique. She received 50.4 gray at 1.8 gray per fraction in this manner. The patient then received a 4 field photon boost treatment for an additional 10 gray at 2 gray per fraction. The patient's final dose was 60.4 gray.  Narrative: The patient tolerated radiation treatment relatively well.   The patient had some significant skin irritation towards the end of treatment. This was managed with several skin cream to/antibiotic creams.  Plan: The patient has completed radiation treatment. The patient will return to radiation oncology clinic for routine followup in one month. I advised the patient to call or return sooner if they have any questions or concerns related to their recovery or treatment. ________________________________  Radene Gunning, M.D., Ph.D.

## 2013-05-09 NOTE — Progress Notes (Signed)
  Radiation Oncology         [phone]) [phone] ________________________________  Name: [name] MRN: [id number]  Date: 04/27/2013  DOB: 07-Feb-1941  Simulation Verification Note   NARRATIVE: The patient was brought to the treatment unit and placed in the planned treatment position for her boost treatment. This will correspond to a photon boost. The clinical setup was verified. Then port films were obtained and uploaded to the radiation oncology medical record software.  The treatment beams were carefully compared against the planned radiation fields. The position, location, and shape of the radiation fields was reviewed. The targeted volume of tissue appears to be appropriately covered by the radiation beams. Based on my personal review, I approved the simulation verification. The patient's treatment will proceed as planned.  ________________________________   Radene Gunning, MD, PhD

## 2013-05-15 ENCOUNTER — Ambulatory Visit: Payer: Medicare Other | Attending: General Surgery | Admitting: Physical Therapy

## 2013-05-15 DIAGNOSIS — M2550 Pain in unspecified joint: Secondary | ICD-10-CM | POA: Insufficient documentation

## 2013-05-15 DIAGNOSIS — R262 Difficulty in walking, not elsewhere classified: Secondary | ICD-10-CM | POA: Insufficient documentation

## 2013-05-15 DIAGNOSIS — IMO0001 Reserved for inherently not codable concepts without codable children: Secondary | ICD-10-CM | POA: Insufficient documentation

## 2013-05-15 DIAGNOSIS — M6281 Muscle weakness (generalized): Secondary | ICD-10-CM | POA: Insufficient documentation

## 2013-05-23 ENCOUNTER — Encounter: Payer: Self-pay | Admitting: Radiation Oncology

## 2013-05-25 ENCOUNTER — Encounter: Payer: Self-pay | Admitting: Radiation Oncology

## 2013-05-25 ENCOUNTER — Ambulatory Visit
Admission: RE | Admit: 2013-05-25 | Discharge: 2013-05-25 | Disposition: A | Discharge status: Home / Self Care | Payer: Medicare Other | Source: Ambulatory Visit | Attending: Radiation Oncology | Admitting: Radiation Oncology

## 2013-05-25 VITALS — BP 122/90 | HR 100 | Temp 98.2°F | Resp 20

## 2013-05-25 DIAGNOSIS — C50412 Malignant neoplasm of upper-outer quadrant of left female breast: Secondary | ICD-10-CM

## 2013-05-25 HISTORY — DX: Personal history of irradiation: Z92.3

## 2013-05-25 NOTE — Progress Notes (Signed)
Radiation Oncology         [phone] ________________________________  Name: [name] MRN: [id number]  Date: 05/25/2013  DOB: Apr 10, 1941  Follow-Up Visit Note  CC: Salena Saner., MD  Salena Saner., MD  Diagnosis:   Invasive ductal carcinoma of the left breast  Interval Since Last Radiation:  3 weeks   Narrative:  The patient returns today for routine follow-up.  The patient states that  she has done better since she was last seen. She has been using Silvadene cream and her skin has been improving. This has been feeling better with less discomfort over the last couple of weeks.                             ALLERGIES:  is allergic to iodine.  Meds: Current Outpatient Prescriptions  Medication Sig Dispense Refill  . acetaminophen-codeine (TYLENOL #3) 300-30 MG per tablet Take 1 tablet by mouth every 6 (six) hours as needed for pain.      Marland Kitchen ALPRAZolam (XANAX) 0.25 MG tablet Take 0.25 mg by mouth at bedtime as needed.       Marland Kitchen buPROPion (WELLBUTRIN XL) 150 MG 24 hr tablet Take 150 mg by mouth 2 (two) times daily.       . Calcium Carbonate-Vitamin D (CALTRATE 600+D) 600-400 MG-UNIT per tablet Take 1 tablet by mouth daily.      . carisoprodol (SOMA) 350 MG tablet Take 350 mg by mouth 2 (two) times daily.       . Diclofenac Sodium (PENNSAID) 1.5 % SOLN Place 1 Bottle onto the skin daily as needed. For pain      . FIBER SELECT GUMMIES PO Take 2 tablets by mouth every morning.      . hyaluronate sodium (RADIAPLEXRX) GEL Apply 1 application topically 2 (two) times daily. Add to affected skin area after rad tx and in am after your shower/bath, just not 4 hours prior to coming for rad tx      . HYDROcodone-acetaminophen (NORCO) 10-325 MG per tablet Take 1-2 tablets by mouth every 6 (six) hours as needed.  30 tablet  0  . metoprolol succinate (TOPROL-XL) 50 MG 24 hr tablet Take 50 mg by mouth 2 (two) times daily.       . Multiple Vitamin (MULTIVITAMIN WITH MINERALS) TABS tablet  Take 1 tablet by mouth daily.      . non-metallic deodorant Jethro Poling) MISC Apply 1 application topically daily as needed.      . RESTASIS 0.05 % ophthalmic emulsion Place 1 drop into both eyes 2 (two) times daily.       . silver sulfADIAZINE (SILVADENE) 1 % cream Apply 1 application topically 2 (two) times daily. Apply 2x day, after da tx and bedtime, must remove cream before re-applying to affected skin area      . Vitamin D, Ergocalciferol, (DRISDOL) 50000 UNITS CAPS capsule Take 50,000 Units by mouth every 7 (seven) days.       No current facility-administered medications for this encounter.    Physical Findings: The patient is in no acute distress. Patient is alert and oriented.  oral temperature is 98.2 F (36.8 C). Her blood pressure is 122/90 and her pulse is 100. Her respiration is 20. .   The patient's skin has markedly improved since she was last seen. Ongoing dryness with some ongoing desquamation but no moist desquamation and the underlying new skin is coming in very well.  Lab Findings: Lab Results  Component Value Date   WBC 6.4 01/11/2013   HGB 11.7* 01/11/2013   HCT 35.9* 01/11/2013   MCV 78.0 01/11/2013   PLT 200 01/11/2013     Radiographic Findings: No results found.  Impression:    The patient scan continues to heal and we'll do so for a couple more weeks but it is markedly improved. We recommended that she continue her skin cream for several more weeks and she was given additional skin cream today. The patient is scheduled to be seen by medical oncology in the near future.  Plan:  The patient will return to our clinic on a when necessary basis.   Jodelle Gross, M.D., Ph.D.

## 2013-05-25 NOTE — Progress Notes (Signed)
Follow up s/p lt breast rad txs 03/19/13-05/03/13, left breast well healed, still using silvadene cream, will switch to a lotion with vit e when silvadene cream is done stated patient, using walker, weak, appetite comes and goes stated,  Declined weight today, manual b/p=122/90,p=100=rr=20, t=98.2 2:26 PM

## 2013-05-29 ENCOUNTER — Ambulatory Visit: Payer: Medicare Other | Admitting: Podiatry

## 2013-05-30 ENCOUNTER — Ambulatory Visit: Payer: MEDICARE | Attending: General Surgery | Admitting: Physical Therapy

## 2013-05-30 DIAGNOSIS — IMO0001 Reserved for inherently not codable concepts without codable children: Secondary | ICD-10-CM | POA: Insufficient documentation

## 2013-05-30 DIAGNOSIS — R262 Difficulty in walking, not elsewhere classified: Secondary | ICD-10-CM | POA: Insufficient documentation

## 2013-05-30 DIAGNOSIS — M2550 Pain in unspecified joint: Secondary | ICD-10-CM | POA: Insufficient documentation

## 2013-05-30 DIAGNOSIS — M6281 Muscle weakness (generalized): Secondary | ICD-10-CM | POA: Insufficient documentation

## 2013-05-31 ENCOUNTER — Ambulatory Visit (HOSPITAL_BASED_OUTPATIENT_CLINIC_OR_DEPARTMENT_OTHER): Payer: MEDICARE | Admitting: Oncology

## 2013-05-31 ENCOUNTER — Encounter: Payer: Self-pay | Admitting: Oncology

## 2013-05-31 ENCOUNTER — Telehealth: Payer: Self-pay | Admitting: Oncology

## 2013-05-31 VITALS — BP 166/94 | HR 103 | Temp 98.1°F | Resp 20 | Ht 67.0 in | Wt 318.9 lb

## 2013-05-31 DIAGNOSIS — C50919 Malignant neoplasm of unspecified site of unspecified female breast: Secondary | ICD-10-CM

## 2013-05-31 DIAGNOSIS — Z170 Estrogen receptor positive status [ER+]: Secondary | ICD-10-CM

## 2013-05-31 DIAGNOSIS — C50412 Malignant neoplasm of upper-outer quadrant of left female breast: Secondary | ICD-10-CM

## 2013-05-31 DIAGNOSIS — R609 Edema, unspecified: Secondary | ICD-10-CM

## 2013-05-31 MED ORDER — ANASTROZOLE 1 MG PO TABS: 1.0000 mg | ORAL_TABLET | Freq: Every day | ORAL | Status: AC

## 2013-05-31 NOTE — Progress Notes (Signed)
OFFICE PROGRESS NOTE  CCSalena Saner., MD [address] [place] [address] Dr. Rolm Bookbinder  Dr. Kyung Rudd  DIAGNOSIS: 73 year old female with diagnosis of breast cancer in the left breast.   STAGE:  Breast cancer of upper-outer quadrant of left female breast  Primary site: Breast (Left)  Staging method: AJCC 7th Edition  Pathologic: Stage IA (T1c, N0, cM0) signed by Rolm Bookbinder, MD on 01/24/2013 1:53 PM  Summary: Stage IA (T1c, N0, cM0  PRIOR THERAPY:  #1Patient had recently hysterectomy/BSO for endometrial cancer at [facility]. She has some difficulty recovering from it wasn't a skilled nursing facility for a little while. She felt a possible mass within the left breast and had mammogram and ultrasound performed that were suspicious. 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 On 12/26/2012 patient had an MRI performed that showed a 1.9 cm irregular enhancing mass within the left breast at the 12:00 position. She was seen by Dr. Rolm Bookbinder and on a 21 2014 patient underwent a lumpectomy with sentinel lymph node biopsy. Her final pathology revealed invasive ductal carcinoma measuring 1.9 cm all margins were negative. Sentinel node was negative. Final pathologic staging was T1 C. N0 M0 (stage I).   #2 patient had staging studies performed after her next last visit with me. They are negative for any evidence of metastatic disease.  #3 patient had Oncotype DX testing performed her recurrence score was low at 10 giving her a 7% risk of distant recurrence with tamoxifen therapy alone.  #4 S/P Radiation therapy between 03/20/13 - 05/03/13  #5 Begin arimidex 1 mg daily starting 05/31/13 x 5 years, curative intent  CURRENT THERAPY: arimidex 1 mg daily  INTERVAL HISTORY: [name] 73 y.o. female returns for followup visit after completion of radiation therapy, she has had  significant radiation induced skin toxicity. She is now healing nicely. She has no fevers or chills, no nausea or vomiting, has stable lower extremity edema with some achiness. No changes in bowel bladder habits. Remainder of the 10 point ROS is negative.  MEDICAL HISTORY: Past Medical History  Diagnosis Date  . Hyperlipidemia   . Hypertension   . Cold     just started 01/02/13 cough  . Complication of anesthesia     very phobic about needles.last surgery had to sedate before doing iv  . Exertional shortness of breath   . Uterine cancer   . Chronic lower back pain   . Depression   . Arthritis     "joints; mostly on the right side" (01/11/2013)  . Breast cancer     "left" (01/11/2013)  . Cancer     endometrial  . Allergy   . Osteoporosis     knees   . Status post radiation therapy within last four weeks 03/19/13-05/03/13    lt breast 60.4GY    ALLERGIES:  is allergic to iodine.  MEDICATIONS:  Current Outpatient Prescriptions  Medication Sig Dispense Refill  . acetaminophen-codeine (TYLENOL #3) 300-30 MG per tablet Take 1 tablet by mouth every 6 (six) hours as needed for pain.      Marland Kitchen ALPRAZolam (XANAX) 0.25 MG tablet Take 0.25 mg by mouth at bedtime as needed.       Marland Kitchen buPROPion (WELLBUTRIN XL) 150 MG 24 hr tablet Take 150 mg by mouth 2 (two) times daily.       . Calcium Carbonate-Vitamin D (CALTRATE 600+D) 600-400 MG-UNIT per tablet  Take 1 tablet by mouth daily.      . carisoprodol (SOMA) 350 MG tablet Take 350 mg by mouth 2 (two) times daily.       . Diclofenac Sodium (PENNSAID) 1.5 % SOLN Place 1 Bottle onto the skin daily as needed. For pain      . FIBER SELECT GUMMIES PO Take 2 tablets by mouth every morning.      . hyaluronate sodium (RADIAPLEXRX) GEL Apply 1 application topically 2 (two) times daily. Add to affected skin area after rad tx and in am after your shower/bath, just not 4 hours prior to coming for rad tx      . HYDROcodone-acetaminophen (NORCO) 10-325 MG per  tablet Take 1-2 tablets by mouth every 6 (six) hours as needed.  30 tablet  0  . metoprolol succinate (TOPROL-XL) 50 MG 24 hr tablet Take 50 mg by mouth 2 (two) times daily.       . Multiple Vitamin (MULTIVITAMIN WITH MINERALS) TABS tablet Take 1 tablet by mouth daily.      . non-metallic deodorant Jethro Poling) MISC Apply 1 application topically daily as needed.      . RESTASIS 0.05 % ophthalmic emulsion Place 1 drop into both eyes 2 (two) times daily.       . silver sulfADIAZINE (SILVADENE) 1 % cream Apply 1 application topically 2 (two) times daily. Apply 2x day, after da tx and bedtime, must remove cream before re-applying to affected skin area      . Vitamin D, Ergocalciferol, (DRISDOL) 50000 UNITS CAPS capsule Take 50,000 Units by mouth every 7 (seven) days.       No current facility-administered medications for this visit.    SURGICAL HISTORY:  Past Surgical History  Procedure Laterality Date  . Replacement total knee Bilateral 2011-2012  . Foot tendon surgery Right   . Breast lumpectomy with needle localization and axillary sentinel lymph node bx Left 01/11/2013  . Breast biopsy Left 12/2012  . Abdominal hysterectomy  12/14/2012  . Dilation and curettage of uterus      "@ least one" (01/11/2013)  . Tubal ligation    . Cholecystectomy  1970  . Knee arthroscopy Right 1990's    "twice" (01/11/2013)  . Breast lumpectomy with needle localization and axillary sentinel lymph node bx Left 01/11/2013    Procedure: BREAST LUMPECTOMY WITH NEEDLE LOCALIZATION AND AXILLARY SENTINEL LYMPH NODE BX;  Surgeon: Rolm Bookbinder, MD;  Location: [facility];  Service: General;  Laterality: Left;    REVIEW OF SYSTEMS:  Pertinent items are noted in HPI.   HEALTH MAINTENANCE:  PHYSICAL EXAMINATION: Blood pressure 166/94, pulse 103, temperature 98.1 F (36.7 C), temperature source Oral, resp. rate 20, height '5\' 7"'  (1.702 m), weight 318 lb 14.4 oz (144.652 kg). Body mass index is 49.94 kg/(m^2). ECOG PERFORMANCE  STATUS: 0 - Asymptomatic   General appearance: alert, cooperative and appears stated age Lymph nodes: Cervical, supraclavicular, and axillary nodes normal. Resp: clear to auscultation bilaterally Cardio: regular rate and rhythm GI: soft, non-tender; bowel sounds normal; no masses,  no organomegaly Extremities: extremities normal, atraumatic, no cyanosis or edema   LABORATORY DATA: Lab Results  Component Value Date   WBC 6.4 01/11/2013   HGB 11.7* 01/11/2013   HCT 35.9* 01/11/2013   MCV 78.0 01/11/2013   PLT 200 01/11/2013      Chemistry      Component Value Date/Time   NA 139 01/05/2013 1357   K 4.1 01/05/2013 1357   CL 102 01/05/2013  1357   CO2 26 01/05/2013 1357   BUN 12 01/05/2013 1357   CREATININE 0.95 01/11/2013 1422      Component Value Date/Time   CALCIUM 9.6 01/05/2013 1357   ALKPHOS 71 03/31/2010 1115   AST 21 03/31/2010 1115   ALT 21 03/31/2010 1115   BILITOT 0.7 03/31/2010 1115       RADIOGRAPHIC STUDIES:  Ct Angio Chest Pe W/cm &/or Wo Cm  02/08/2013   *RADIOLOGY REPORT*  Clinical Data: Shortness of breath.  Breast cancer and endometrial cancer.  CT ANGIOGRAPHY CHEST  Technique:  Multidetector CT imaging of the chest using the standard protocol during bolus administration of intravenous contrast. Multiplanar reconstructed images including MIPs were obtained and reviewed to evaluate the vascular anatomy.  Contrast: 153m OMNIPAQUE IOHEXOL 350 MG/ML SOLN  Comparison: Chest x-ray dated 01/05/2013  Findings: There are no pulmonary emboli, infiltrates or effusions. There is a tiny area of scarring in the lateral aspect of the lingula.  There is no hilar or mediastinal adenopathy.  Heart size and pulmonary vascularity are normal.  No acute osseous abnormality.  There is severe degenerative disc disease at T10-11.  Large masses are noted in the left breast consistent with carcinoma.  IMPRESSION:  1.  No pulmonary emboli or other acute disease in the chest. 2.  Large masses in the left  breast consistent with carcinoma.   Original Report Authenticated By: JLorriane Shire M.D.   Ct Abdomen Pelvis W Contrast  02/08/2013   *RADIOLOGY REPORT*  Clinical Data: Patient with history of left breast cancer. Endometrial cancer.  CT ABDOMEN AND PELVIS WITH CONTRAST  Technique:  Multidetector CT imaging of the abdomen and pelvis was performed following the standard protocol during bolus administration of intravenous contrast.  Contrast: 1225mOMNIPAQUE IOHEXOL 350 MG/ML SOLN  Comparison: None  Findings: Limited visualization of the lower thorax demonstrates minimal scarring within the lingula.  Minimal dependent atelectasis.  No pleural effusion.  Normal heart size.  Liver is normal in size and contour without focal hepatic lesion identified.  Portal veins are patent.  Spleen, pancreas and bilateral adrenal glands are unremarkable.  Kidneys enhance symmetrically with contrast.  Normal caliber abdominal aorta.  Calcified atherosclerotic plaque at the origin of the celiac and superior mesenteric arteries.  No retroperitoneal or pelvic lymphadenopathy. There is a 0.7 cm left external iliac lymph node.  Urinary bladder is grossly unremarkable.  Descending and sigmoid colonic diverticulosis without evidence for acute diverticulitis.  Off the anterior margin of the transverse colon (image 34; series 8) is a fat containing partially calcified mass which likely representing sequelae of prior epiploic appendagitis.  Normal appendix.  No evidence for abnormal bowel wall thickening or dilatation.  Small hiatal hernia.  Right greater than left hip joint and lower lumbar spine degenerative change.  No aggressive appearing osseous lesions.  IMPRESSION: 1. No evidence for metastatic disease within the abdomen or pelvis.   Original Report Authenticated By: DRLovey NewcomerM.D    ASSESSMENT: [age]ear old female with  #1 stage I (T1 C. N0) invasive ductal carcinoma. Patient is status post lumpectomy the tumor was ER positive  HER-2/neu negative. Oncotype score is low. She is s/p radiation therapy completed on 05/03/13. Tolerated well.  #2 begin arimidex 1 mg daily with curative intent. Total of 5 years is planned. We discussed the risks and benefitsof therapy. She has consented to starting the therapy, she demonstrated understanding of the rational for adjuvant therapy. All questions were answered.  #3. Edema per PCP  PLAN:   #1 arimidex 1 mg daily  #2 return in 3 months for follow up  All questions were answered. The patient knows to call the clinic with any problems, questions or concerns. We can certainly see the patient much sooner if necessary.  I spent 20 minutes counseling the patient face to face. The total time spent in the appointment was 30 minutes.    Marcy Panning, MD Medical/Oncology [facility] [phone] (beeper) [phone] (Office)  05/31/2013, 10:17 AM

## 2013-05-31 NOTE — Patient Instructions (Signed)

## 2013-06-05 ENCOUNTER — Ambulatory Visit: Payer: MEDICARE | Admitting: Physical Therapy

## 2013-06-06 ENCOUNTER — Telehealth (INDEPENDENT_AMBULATORY_CARE_PROVIDER_SITE_OTHER): Payer: Self-pay | Admitting: General Surgery

## 2013-06-06 NOTE — Telephone Encounter (Signed)
Dr Donne Hazel said yes on the consult referral to [facility] but not able to call this message back b/c the number is incorrect.

## 2013-06-06 NOTE — Addendum Note (Signed)
Addended by: Illene Regulus on: 06/06/2013 03:21 PM   Modules accepted: Orders

## 2013-06-06 NOTE — Telephone Encounter (Signed)
Maudry Diego, physical therapist, called to ask Dr. Donne Hazel for a consult referral to [facility] at [facility] for weight loss.  Pt will be in to see Ms Owens Shark tomorrow and she will give the pt the phone number to call and set up her appt for consult.

## 2013-06-06 NOTE — Telephone Encounter (Signed)
I placed the order for [facility] for weight loss.

## 2013-06-07 ENCOUNTER — Ambulatory Visit: Payer: MEDICARE | Admitting: Physical Therapy

## 2013-06-12 ENCOUNTER — Encounter: Payer: Medicare Other | Admitting: Physical Therapy

## 2013-06-14 ENCOUNTER — Ambulatory Visit: Payer: MEDICARE | Admitting: Physical Therapy

## 2013-06-19 ENCOUNTER — Ambulatory Visit: Payer: MEDICARE | Admitting: Physical Therapy

## 2013-06-21 ENCOUNTER — Ambulatory Visit: Payer: MEDICARE | Admitting: Physical Therapy

## 2013-06-25 ENCOUNTER — Telehealth: Payer: Self-pay | Admitting: *Deleted

## 2013-06-25 ENCOUNTER — Telehealth: Payer: Self-pay | Admitting: Emergency Medicine

## 2013-06-25 NOTE — Telephone Encounter (Signed)
     Provider input needed:  possible anastrozole reaction, left arm pain    Reason for call: Spoke with patient who reports being on anastrozole for one month and can hardly move her left arm where biopsy performed    Musculoskeletal:positive for myalgias and stiff joints   Patient last received chemotherapy/ treatment on n/a  Patient was last seen in the office on 05-31-2013  Next appt is 09-12-2013  Is patient having fevers greater than 100.5?  no   Is patient having uncontrolled pain, or new pain? yes, new x 1 month to left arm Physical therapist noticed worsening over time.   Is patient having new back pain that changes with position (worsens or eases when laying down?)  no   Is patient able to eat and drink? yes    Is patient able to pass stool without difficulty?   yes     Is patient having uncontrolled nausea?  no    patient calls 06/25/2013 with complaint of  Musculoskeletal:positive for myalgias and stiff joints.  The left side hurts, shoulder aches and I can hardly move or raise my arm.  Had the right side problem hurting before beginning to see Dr. Humphrey Rolls due to rotator cuff problem and arthritis.  Now that I've started anastrozole, my left arm hurts too.  The side effects read that it cause numbness, tingling to arms and legs so I think this is from the anastrozole.  Will you give the doctor my number [phone] to call me.    Summary Based on the above information advised patient to  Await return call from provider or nurse.   Leon, Sherry Burkes  06/25/2013, 1:52 PM   Background Info  [name]   DOB: 1941/02/15   MR#: [id number]   CSN#   [id number] 06/25/2013

## 2013-06-25 NOTE — Telephone Encounter (Signed)
Patient should discontinue the anastrozole for 1 month. Call us back to let us know how she is doing

## 2013-06-25 NOTE — Telephone Encounter (Signed)
Patient called stating she is having left arm pain, near surgical site, and difficulty with ROM. Patient also states she is having right sided aches and pains, which seems to be her usual pains due to arthritis. Patient states left sided pain onset one week after starting Arimidex. Patient instructed to stop the Arimidex for 1 week and follow up with this office next week for further direction. Patient verbalized understanding. Instructed patient to call if pain worsens in the meantime.

## 2013-06-26 ENCOUNTER — Ambulatory Visit: Payer: MEDICARE | Attending: General Surgery | Admitting: Physical Therapy

## 2013-06-26 DIAGNOSIS — R262 Difficulty in walking, not elsewhere classified: Secondary | ICD-10-CM | POA: Insufficient documentation

## 2013-06-26 DIAGNOSIS — M2550 Pain in unspecified joint: Secondary | ICD-10-CM | POA: Insufficient documentation

## 2013-06-26 DIAGNOSIS — M6281 Muscle weakness (generalized): Secondary | ICD-10-CM | POA: Insufficient documentation

## 2013-06-26 DIAGNOSIS — IMO0001 Reserved for inherently not codable concepts without codable children: Secondary | ICD-10-CM | POA: Insufficient documentation

## 2013-06-28 ENCOUNTER — Ambulatory Visit: Payer: MEDICARE | Admitting: Physical Therapy

## 2013-07-02 ENCOUNTER — Telehealth: Payer: Self-pay | Admitting: *Deleted

## 2013-07-02 NOTE — Telephone Encounter (Signed)
Received call from patient that she stopped the Arimidex one week ago, and what does Dr. Humphrey Rolls want me to do? I spoke with patient and she stated, " all of the pain is gone. I have no problem moving my arms." Per Dr. Humphrey Rolls, patient may start Arimidex again and call the office if the pain occurs. Patient verbalized understanding.

## 2013-07-03 ENCOUNTER — Ambulatory Visit: Payer: MEDICARE | Admitting: Physical Therapy

## 2013-07-05 ENCOUNTER — Ambulatory Visit: Payer: MEDICARE

## 2013-07-09 ENCOUNTER — Other Ambulatory Visit: Payer: Self-pay | Admitting: Emergency Medicine

## 2013-07-09 ENCOUNTER — Telehealth: Payer: Self-pay | Admitting: Emergency Medicine

## 2013-07-09 ENCOUNTER — Ambulatory Visit: Payer: MEDICARE | Admitting: Physical Therapy

## 2013-07-09 NOTE — Telephone Encounter (Signed)
Patient called stating that she restarted the Arimidex on 07/02/13; states that the joint pains have returned. Patient concerned that she may need a different medication instead of the Arimidex.

## 2013-07-09 NOTE — Telephone Encounter (Signed)
Please have patient come and see LC in the next 2 - 3 weeks. Have her stop the arimidex

## 2013-07-10 ENCOUNTER — Encounter: Payer: Medicare Other | Admitting: Physical Therapy

## 2013-07-10 ENCOUNTER — Telehealth: Payer: Self-pay | Admitting: Oncology

## 2013-07-10 NOTE — Telephone Encounter (Signed)
S/w the pt and she is aware of her march appts for lab and md.

## 2013-07-10 NOTE — Telephone Encounter (Signed)
Stacy  Can you please make sure the below has been taken care of

## 2013-07-12 ENCOUNTER — Ambulatory Visit: Payer: MEDICARE | Admitting: Physical Therapy

## 2013-07-17 ENCOUNTER — Ambulatory Visit: Payer: MEDICARE | Admitting: Physical Therapy

## 2013-07-19 ENCOUNTER — Ambulatory Visit: Payer: MEDICARE | Admitting: Physical Therapy

## 2013-07-23 ENCOUNTER — Ambulatory Visit: Payer: MEDICARE | Attending: General Surgery | Admitting: Physical Therapy

## 2013-07-23 DIAGNOSIS — M6281 Muscle weakness (generalized): Secondary | ICD-10-CM | POA: Diagnosis not present

## 2013-07-23 DIAGNOSIS — R262 Difficulty in walking, not elsewhere classified: Secondary | ICD-10-CM | POA: Diagnosis not present

## 2013-07-23 DIAGNOSIS — IMO0001 Reserved for inherently not codable concepts without codable children: Secondary | ICD-10-CM | POA: Diagnosis present

## 2013-07-23 DIAGNOSIS — M2550 Pain in unspecified joint: Secondary | ICD-10-CM | POA: Insufficient documentation

## 2013-07-24 ENCOUNTER — Other Ambulatory Visit (HOSPITAL_BASED_OUTPATIENT_CLINIC_OR_DEPARTMENT_OTHER): Payer: MEDICARE

## 2013-07-24 ENCOUNTER — Encounter: Payer: Self-pay | Admitting: Adult Health

## 2013-07-24 ENCOUNTER — Other Ambulatory Visit: Payer: Medicare Other

## 2013-07-24 ENCOUNTER — Ambulatory Visit (HOSPITAL_BASED_OUTPATIENT_CLINIC_OR_DEPARTMENT_OTHER): Payer: MEDICARE | Admitting: Adult Health

## 2013-07-24 VITALS — BP 137/81 | HR 102 | Temp 97.7°F | Resp 20 | Ht 67.0 in | Wt 312.7 lb

## 2013-07-24 DIAGNOSIS — C50919 Malignant neoplasm of unspecified site of unspecified female breast: Secondary | ICD-10-CM

## 2013-07-24 DIAGNOSIS — Z170 Estrogen receptor positive status [ER+]: Secondary | ICD-10-CM

## 2013-07-24 DIAGNOSIS — C50412 Malignant neoplasm of upper-outer quadrant of left female breast: Secondary | ICD-10-CM

## 2013-07-24 LAB — CBC WITH DIFFERENTIAL/PLATELET
BASO%: 1.1 % (ref 0.0–2.0)
BASOS ABS: 0.1 10*3/uL (ref 0.0–0.1)
EOS%: 1.5 % (ref 0.0–7.0)
Eosinophils Absolute: 0.1 10*3/uL (ref 0.0–0.5)
HEMATOCRIT: 41.7 % (ref 34.8–46.6)
HGB: 13.2 g/dL (ref 11.6–15.9)
LYMPH#: 1.2 10*3/uL (ref 0.9–3.3)
LYMPH%: 16.3 % (ref 14.0–49.7)
MCH: 26.4 pg (ref 25.1–34.0)
MCHC: 31.6 g/dL (ref 31.5–36.0)
MCV: 83.6 fL (ref 79.5–101.0)
MONO#: 0.6 10*3/uL (ref 0.1–0.9)
MONO%: 8 % (ref 0.0–14.0)
NEUT#: 5.5 10*3/uL (ref 1.5–6.5)
NEUT%: 73.1 % (ref 38.4–76.8)
Platelets: 202 10*3/uL (ref 145–400)
RBC: 4.99 10*6/uL (ref 3.70–5.45)
RDW: 15.1 % — AB (ref 11.2–14.5)
WBC: 7.5 10*3/uL (ref 3.9–10.3)

## 2013-07-24 LAB — COMPREHENSIVE METABOLIC PANEL (CC13)
ALK PHOS: 89 U/L (ref 40–150)
ALT: 34 U/L (ref 0–55)
AST: 16 U/L (ref 5–34)
Albumin: 3.3 g/dL — ABNORMAL LOW (ref 3.5–5.0)
Anion Gap: 12 mEq/L — ABNORMAL HIGH (ref 3–11)
BUN: 15.3 mg/dL (ref 7.0–26.0)
CALCIUM: 10.1 mg/dL (ref 8.4–10.4)
CHLORIDE: 107 meq/L (ref 98–109)
CO2: 25 mEq/L (ref 22–29)
CREATININE: 1 mg/dL (ref 0.6–1.1)
Glucose: 143 mg/dl — ABNORMAL HIGH (ref 70–140)
Potassium: 4.3 mEq/L (ref 3.5–5.1)
Sodium: 143 mEq/L (ref 136–145)
Total Bilirubin: 0.4 mg/dL (ref 0.20–1.20)
Total Protein: 7.3 g/dL (ref 6.4–8.3)

## 2013-07-24 NOTE — Progress Notes (Signed)
OFFICE PROGRESS NOTE  CCSalena Leon., MD [address] [place] [address] Dr. Rolm Bookbinder  Dr. Kyung Rudd  DIAGNOSIS: 73 year old female with diagnosis of breast cancer in the left breast.   STAGE:  Breast cancer of upper-outer quadrant of left female breast  Primary site: Breast (Left)  Staging method: AJCC 7th Edition  Pathologic: Stage IA (T1c, N0, cM0) signed by Rolm Bookbinder, MD on 01/24/2013 1:53 PM  Summary: Stage IA (T1c, N0, cM0  PRIOR THERAPY:  #1Patient had recently hysterectomy/BSO for endometrial cancer at [facility]. She has some difficulty recovering from it wasn't a skilled nursing facility for a little while. She felt a possible mass within the left breast and had mammogram and ultrasound performed that were suspicious. 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 On 12/26/2012 patient had an MRI performed that showed a 1.9 cm irregular enhancing mass within the left breast at the 12:00 position. She was seen by Dr. Rolm Bookbinder and on a 21 2014 patient underwent a lumpectomy with sentinel lymph node biopsy. Her final pathology revealed invasive ductal carcinoma measuring 1.9 cm all margins were negative. Sentinel node was negative. Final pathologic staging was T1 C. N0 M0 (stage I).   #2 patient had staging studies performed after her next last visit with me. They are negative for any evidence of metastatic disease.  #3 patient had Oncotype DX testing performed her recurrence score was low at 10 giving her a 7% risk of distant recurrence with tamoxifen therapy alone.  #4 S/P Radiation therapy between 03/20/13 - 05/03/13  #5 Begin arimidex 1 mg daily starting 05/31/13 x 5 years, curative intent  CURRENT THERAPY: arimidex 1 mg daily  INTERVAL HISTORY: [name] 73 y.o. female returns for a visit since starting Arimidex on 05/31/13.  She has had  significant hot flashes and joint aches since beginning.  She also reports itching, diarrhea, sweating, decreased appetite, numbness.  She is also having difficulty with the range of motion of her arms bilaterally.  She is seeing physical therapy and working with them.  She has tried Tylenol to help with the pain, and is also taking Carisoprodol which hasn't helped.  She's been off the arimidex for about 10 days.  Otherwise, she is doing well, has no breast changes, night sweats, unintentional weight loss, or any further concerns.    MEDICAL HISTORY: Past Medical History  Diagnosis Date  . Hyperlipidemia   . Hypertension   . Cold     just started 01/02/13 cough  . Complication of anesthesia     very phobic about needles.last surgery had to sedate before doing iv  . Exertional shortness of breath   . Uterine cancer   . Chronic lower back pain   . Depression   . Arthritis     "joints; mostly on the right side" (01/11/2013)  . Breast cancer     "left" (01/11/2013)  . Cancer     endometrial  . Allergy   . Osteoporosis     knees   . Status post radiation therapy within last four weeks 03/19/13-05/03/13    lt breast 60.4GY    ALLERGIES:  is allergic to iodine.  MEDICATIONS:  Current Outpatient Prescriptions  Medication Sig Dispense Refill  . acetaminophen-codeine (TYLENOL #3) 300-30 MG per tablet Take 1 tablet by mouth every 6 (six) hours as needed for pain.      Marland Kitchen buPROPion (WELLBUTRIN XL)  150 MG 24 hr tablet Take 150 mg by mouth 2 (two) times daily.       . Calcium Carbonate-Vitamin D (CALTRATE 600+D) 600-400 MG-UNIT per tablet Take 1 tablet by mouth daily.      . carisoprodol (SOMA) 350 MG tablet Take 350 mg by mouth 2 (two) times daily.       . Diclofenac Sodium (PENNSAID) 1.5 % SOLN Place 1 Bottle onto the skin daily as needed. For pain      . FIBER SELECT GUMMIES PO Take 2 tablets by mouth every morning.      . metoprolol succinate (TOPROL-XL) 50 MG 24 hr tablet Take 50 mg by  mouth 2 (two) times daily.       . Multiple Vitamin (MULTIVITAMIN WITH MINERALS) TABS tablet Take 1 tablet by mouth daily.      . RESTASIS 0.05 % ophthalmic emulsion Place 1 drop into both eyes 2 (two) times daily.       . Vitamin D, Ergocalciferol, (DRISDOL) 50000 UNITS CAPS capsule Take 50,000 Units by mouth. Take Tuesdays and Thursdays.      . ALPRAZolam (XANAX) 0.25 MG tablet Take 0.25 mg by mouth at bedtime as needed.       . hyaluronate sodium (RADIAPLEXRX) GEL Apply 1 application topically 2 (two) times daily. Add to affected skin area after rad tx and in am after your shower/bath, just not 4 hours prior to coming for rad tx      . non-metallic deodorant Jethro Poling) MISC Apply 1 application topically daily as needed.      . silver sulfADIAZINE (SILVADENE) 1 % cream Apply 1 application topically 2 (two) times daily. Apply 2x day, after da tx and bedtime, must remove cream before re-applying to affected skin area       No current facility-administered medications for this visit.    SURGICAL HISTORY:  Past Surgical History  Procedure Laterality Date  . Replacement total knee Bilateral 2011-2012  . Foot tendon surgery Right   . Breast lumpectomy with needle localization and axillary sentinel lymph node bx Left 01/11/2013  . Breast biopsy Left 12/2012  . Abdominal hysterectomy  12/14/2012  . Dilation and curettage of uterus      "@ least one" (01/11/2013)  . Tubal ligation    . Cholecystectomy  1970  . Knee arthroscopy Right 1990's    "twice" (01/11/2013)  . Breast lumpectomy with needle localization and axillary sentinel lymph node bx Left 01/11/2013    Procedure: BREAST LUMPECTOMY WITH NEEDLE LOCALIZATION AND AXILLARY SENTINEL LYMPH NODE BX;  Surgeon: Rolm Bookbinder, MD;  Location: [facility];  Service: General;  Laterality: Left;    REVIEW OF SYSTEMS:  A 10 point review of systems was conducted and is otherwise negative except for what is noted above.    Health Maintenance Mammogram:  12/11/2012 Colonoscopy: 10-11 years ago Bone Density Scan: due Pap Smear: TAH/BSO 11/2012 Eye Exam: 10/2012 Vitamin D Level: PCP follows Lipid Panel: 11/2012   PHYSICAL EXAMINATION: Blood pressure 137/81, pulse 102, temperature 97.7 F (36.5 C), temperature source Oral, resp. rate 20, height '5\' 7"'  (1.702 m), weight 312 lb 11.2 oz (141.84 kg). Body mass index is 48.96 kg/(m^2). GENERAL: Patient is a chronically ill, elderly female in no acute distress HEENT:  Sclerae anicteric.  Oropharynx clear and moist. No ulcerations or evidence of oropharyngeal candidiasis. Neck is supple.  NODES:  No cervical, supraclavicular, or axillary lymphadenopathy palpated.  BREAST EXAM:  Left lumpectomy site without nodularity, no breast changes,  masses, nodules, or sign of recurrence, right breast no masses skin changes, or sign of recurrence. LUNGS:  Clear to auscultation bilaterally.  No wheezes or rhonchi. HEART:  Regular rate and rhythm. No murmur appreciated. ABDOMEN:  Soft, nontender.  Positive, normoactive bowel sounds. No organomegaly palpated. MSK:  No focal spinal tenderness to palpation. Full range of motion bilaterally in the upper extremities. EXTREMITIES:  No peripheral edema.   SKIN:  Clear with no obvious rashes or skin changes. No nail dyscrasia. NEURO:  Nonfocal. Well oriented.  Appropriate affect. ECOG PERFORMANCE STATUS: 0 - Asymptomatic  LABORATORY DATA: Lab Results  Component Value Date   WBC 7.5 07/24/2013   HGB 13.2 07/24/2013   HCT 41.7 07/24/2013   MCV 83.6 07/24/2013   PLT 202 07/24/2013      Chemistry      Component Value Date/Time   NA 143 07/24/2013 1259   NA 139 01/05/2013 1357   K 4.3 07/24/2013 1259   K 4.1 01/05/2013 1357   CL 102 01/05/2013 1357   CO2 25 07/24/2013 1259   CO2 26 01/05/2013 1357   BUN 15.3 07/24/2013 1259   BUN 12 01/05/2013 1357   CREATININE 1.0 07/24/2013 1259   CREATININE 0.95 01/11/2013 1422      Component Value Date/Time   CALCIUM 10.1 07/24/2013 1259   CALCIUM  9.6 01/05/2013 1357   ALKPHOS 89 07/24/2013 1259   ALKPHOS 71 03/31/2010 1115   AST 16 07/24/2013 1259   AST 21 03/31/2010 1115   ALT 34 07/24/2013 1259   ALT 21 03/31/2010 1115   BILITOT 0.40 07/24/2013 1259   BILITOT 0.7 03/31/2010 1115       RADIOGRAPHIC STUDIES:  Ct Angio Chest Pe W/cm &/or Wo Cm  02/08/2013   *RADIOLOGY REPORT*  Clinical Data: Shortness of breath.  Breast cancer and endometrial cancer.  CT ANGIOGRAPHY CHEST  Technique:  Multidetector CT imaging of the chest using the standard protocol during bolus administration of intravenous contrast. Multiplanar reconstructed images including MIPs were obtained and reviewed to evaluate the vascular anatomy.  Contrast: 161m OMNIPAQUE IOHEXOL 350 MG/ML SOLN  Comparison: Chest x-ray dated 01/05/2013  Findings: There are no pulmonary emboli, infiltrates or effusions. There is a tiny area of scarring in the lateral aspect of the lingula.  There is no hilar or mediastinal adenopathy.  Heart size and pulmonary vascularity are normal.  No acute osseous abnormality.  There is severe degenerative disc disease at T10-11.  Large masses are noted in the left breast consistent with carcinoma.  IMPRESSION:  1.  No pulmonary emboli or other acute disease in the chest. 2.  Large masses in the left breast consistent with carcinoma.   Original Report Authenticated By: JLorriane Shire M.D.   Ct Abdomen Pelvis W Contrast  02/08/2013   *RADIOLOGY REPORT*  Clinical Data: Patient with history of left breast cancer. Endometrial cancer.  CT ABDOMEN AND PELVIS WITH CONTRAST  Technique:  Multidetector CT imaging of the abdomen and pelvis was performed following the standard protocol during bolus administration of intravenous contrast.  Contrast: 121mOMNIPAQUE IOHEXOL 350 MG/ML SOLN  Comparison: None  Findings: Limited visualization of the lower thorax demonstrates minimal scarring within the lingula.  Minimal dependent atelectasis.  No pleural effusion.  Normal heart size.   Liver is normal in size and contour without focal hepatic lesion identified.  Portal veins are patent.  Spleen, pancreas and bilateral adrenal glands are unremarkable.  Kidneys enhance symmetrically with contrast.  Normal caliber abdominal aorta.  Calcified atherosclerotic plaque at the origin of the celiac and superior mesenteric arteries.  No retroperitoneal or pelvic lymphadenopathy. There is a 0.7 cm left external iliac lymph node.  Urinary bladder is grossly unremarkable.  Descending and sigmoid colonic diverticulosis without evidence for acute diverticulitis.  Off the anterior margin of the transverse colon (image 34; series 8) is a fat containing partially calcified mass which likely representing sequelae of prior epiploic appendagitis.  Normal appendix.  No evidence for abnormal bowel wall thickening or dilatation.  Small hiatal hernia.  Right greater than left hip joint and lower lumbar spine degenerative change.  No aggressive appearing osseous lesions.  IMPRESSION: 1. No evidence for metastatic disease within the abdomen or pelvis.   Original Report Authenticated By: Lovey Newcomer, M.D    ASSESSMENT: 73 year old female with  #1 stage I (T1 C. N0) invasive ductal carcinoma. Patient is status post lumpectomy the tumor was ER positive HER-2/neu negative. Oncotype score is low. She is s/p radiation therapy completed on 05/03/13. Tolerated well.  #2 begin arimidex 1 mg daily with curative intent. Total of 5 years is planned. Due to her significant problems with the Arimidex, we will hold therapy for one month and re-evaluate the patient.    PLAN:   #1 Patient is doing moderately well.  She has no sign of recurrence.  Due to the pain that began with the Arimidex, she will stop taking the medication.  I have also requested her physical therapy records from cancer rehab.  I refilled the patient's Hydrocodone as well.    #2  We will contact her PCP regarding her last bone density scan.  If it has not  been done, we will order it.    #3  The patient will return in 3 weeks for evaluation of her pain, and to discuss changing to a different anti-estrogen therapy.  However, should her pain worsen, or should she have any questions or concerns, we will see her sooner, and likely need to consider imaging.    All questions were answered. The patient knows to call the clinic with any problems, questions or concerns. We can certainly see the patient much sooner if necessary.  I spent 25 minutes counseling the patient face to face. The total time spent in the appointment was 30 minutes.  Minette Headland, [place] [phone] 07/26/2013, 11:43 AM  Addendum:   At the patient's appointment, the patient requested to discuss the plan with Dr. Humphrey Rolls, however couldn't wait for the plan to be discussed.  She did leave the appointment and I personally called the patient and discussed the plan above with her in detail.  She knows to come and pick up her prescription for Norco from the injection room.  She is in agreement with the plan and verbalized understanding.

## 2013-07-25 ENCOUNTER — Ambulatory Visit: Payer: MEDICARE | Admitting: Physical Therapy

## 2013-07-25 ENCOUNTER — Telehealth: Payer: Self-pay | Admitting: *Deleted

## 2013-07-25 DIAGNOSIS — IMO0001 Reserved for inherently not codable concepts without codable children: Secondary | ICD-10-CM | POA: Diagnosis not present

## 2013-07-25 NOTE — Telephone Encounter (Signed)
Called Dr.Khan 's nurse phone [phone] left voice  message for them to call [name] today ,she is very anxious and had to leave yesterday  When she was seeing [name], due to transportation  Issues, stated she needs direction on chemotherapy medication, was told to restart one but when she did, it gave her diarrhea and nausea, , patient stated she called to med/onc today but hasn't had a return call yet, , 1:42 PM

## 2013-07-26 ENCOUNTER — Other Ambulatory Visit: Payer: Self-pay | Admitting: Adult Health

## 2013-07-26 DIAGNOSIS — C50412 Malignant neoplasm of upper-outer quadrant of left female breast: Secondary | ICD-10-CM

## 2013-07-26 MED ORDER — HYDROCODONE-ACETAMINOPHEN 10-325 MG PO TABS: 1.0000 | ORAL_TABLET | Freq: Four times a day (QID) | ORAL | Status: DC | PRN

## 2013-07-27 ENCOUNTER — Telehealth: Payer: Self-pay | Admitting: Oncology

## 2013-07-27 NOTE — Telephone Encounter (Signed)
s.w. pt and advised on March appt....pt ok and aware °

## 2013-07-30 ENCOUNTER — Telehealth: Payer: Self-pay | Admitting: *Deleted

## 2013-07-30 NOTE — Telephone Encounter (Signed)
[name], [facility] called and informed this office that patient filled a prescription from Dr. Heath Gold for Tylenol #3 disp. # 120. Per [name], NP, do not fill the Norco.

## 2013-07-31 ENCOUNTER — Ambulatory Visit: Payer: MEDICARE | Admitting: Physical Therapy

## 2013-07-31 DIAGNOSIS — IMO0001 Reserved for inherently not codable concepts without codable children: Secondary | ICD-10-CM | POA: Diagnosis not present

## 2013-08-02 ENCOUNTER — Ambulatory Visit: Payer: MEDICARE | Admitting: Physical Therapy

## 2013-08-02 DIAGNOSIS — IMO0001 Reserved for inherently not codable concepts without codable children: Secondary | ICD-10-CM | POA: Diagnosis not present

## 2013-08-06 ENCOUNTER — Telehealth: Payer: Self-pay

## 2013-08-06 NOTE — Telephone Encounter (Signed)
Pt called wanting to know when she will be put back on cancer meds, and stating that her next appointment in April 2015 is too far out.   Returned call - advised patient that per [facility] note, Arimidex to be held for 30 days and then re-evaluated.  Advised pt that her next appt is with [facility] 3/27 at 315, next appt with [facility] 4/22.  Pt belligerent and then tearful - upset that her appt time on 3/3 was incorrect and she could not speak with [facility] on 3/3 and questioning billing, statement that she was "lied to".  She wanted to know what medication would be replace the Arimidex and I advised her that would have to be discussed with [facility] on the 27th.  Pt voiced understanding.

## 2013-08-07 ENCOUNTER — Ambulatory Visit: Payer: MEDICARE | Admitting: Physical Therapy

## 2013-08-07 DIAGNOSIS — IMO0001 Reserved for inherently not codable concepts without codable children: Secondary | ICD-10-CM | POA: Diagnosis not present

## 2013-08-09 ENCOUNTER — Encounter: Payer: Medicare Other | Admitting: Physical Therapy

## 2013-08-14 ENCOUNTER — Ambulatory Visit: Payer: MEDICARE | Admitting: Physical Therapy

## 2013-08-14 DIAGNOSIS — IMO0001 Reserved for inherently not codable concepts without codable children: Secondary | ICD-10-CM | POA: Diagnosis not present

## 2013-08-16 ENCOUNTER — Ambulatory Visit: Payer: MEDICARE | Admitting: Physical Therapy

## 2013-08-16 DIAGNOSIS — IMO0001 Reserved for inherently not codable concepts without codable children: Secondary | ICD-10-CM | POA: Diagnosis not present

## 2013-08-17 ENCOUNTER — Ambulatory Visit (HOSPITAL_BASED_OUTPATIENT_CLINIC_OR_DEPARTMENT_OTHER): Payer: MEDICARE | Admitting: Adult Health

## 2013-08-17 ENCOUNTER — Encounter: Payer: Self-pay | Admitting: Adult Health

## 2013-08-17 VITALS — BP 144/86 | HR 99 | Temp 97.8°F | Resp 18 | Ht 67.0 in | Wt 314.2 lb

## 2013-08-17 DIAGNOSIS — C50412 Malignant neoplasm of upper-outer quadrant of left female breast: Secondary | ICD-10-CM

## 2013-08-17 DIAGNOSIS — M25519 Pain in unspecified shoulder: Secondary | ICD-10-CM

## 2013-08-17 DIAGNOSIS — M542 Cervicalgia: Secondary | ICD-10-CM

## 2013-08-17 DIAGNOSIS — C50919 Malignant neoplasm of unspecified site of unspecified female breast: Secondary | ICD-10-CM

## 2013-08-17 MED ORDER — DULOXETINE HCL 20 MG PO CPEP: 20.0000 mg | ORAL_CAPSULE | Freq: Every day | ORAL | Status: AC

## 2013-08-17 MED ORDER — HYDROCODONE-ACETAMINOPHEN 5-325 MG PO TABS: 1.0000 | ORAL_TABLET | Freq: Four times a day (QID) | ORAL | Status: DC | PRN

## 2013-08-17 NOTE — Patient Instructions (Signed)
Duloxetine delayed-release capsules  What is this medicine?  DULOXETINE (doo LOX e teen) is an antidepressant. It is used to treat depression. It is also used to treat different types of chronic pain.  This medicine may be used for other purposes; ask your health care provider or pharmacist if you have questions.  COMMON BRAND NAME(S): Cymbalta  What should I tell my health care provider before I take this medicine?  They need to know if you have any of these conditions:  -bipolar disorder or a family history of bipolar disorder  -glaucoma  -kidney disease  -liver disease  -suicidal thoughts or a previous suicide attempt  -taken medicines called MAOIs like Carbex, Eldepryl, Marplan, Nardil, and Parnate within 14 days  -an unusual reaction to duloxetine, other medicines, foods, dyes, or preservatives  -pregnant or trying to get pregnant  -breast-feeding  How should I use this medicine?  Take this medicine by mouth with a glass of water. Follow the directions on the prescription label. Do not cut, crush or chew this medicine. You can take this medicine with or without food. Take your medicine at regular intervals. Do not take your medicine more often than directed. Do not stop taking this medicine suddenly except upon the advice of your doctor. Stopping this medicine too quickly may cause serious side effects or your condition may worsen.  A special MedGuide will be given to you by the pharmacist with each prescription and refill. Be sure to read this information carefully each time.  Talk to your pediatrician regarding the use of this medicine in children. Special care may be needed.  Overdosage: If you think you have taken too much of this medicine contact a poison control center or emergency room at once.  NOTE: This medicine is only for you. Do not share this medicine with others.  What if I miss a dose?  If you miss a dose, take it as soon as you can. If it is almost time for your next dose, take only that dose.  Do not take double or extra doses.  What may interact with this medicine?  Do not take this medicine with any of the following medications:  -certain diet drugs like dexfenfluramine, fenfluramine  -desvenlafaxine  -linezolid  -MAOIs like Azilect, Carbex, Eldepryl, Marplan, Nardil, and Parnate  -methylene blue (intravenous)  -milnacipran  -thioridazine  -venlafaxine  This medicine may also interact with the following medications:  -alcohol  -aspirin and aspirin-like medicines  -certain antibiotics like ciprofloxacin and enoxacin  -certain medicines for blood pressure, heart disease, irregular heart beat  -certain medicines for depression, anxiety, or psychotic disturbances  -certain medicines for migraine headache like almotriptan, eletriptan, frovatriptan, naratriptan, rizatriptan, sumatriptan, zolmitriptan  -certain medicines that treat or prevent blood clots like warfarin, enoxaparin, and dalteparin  -cimetidine  -fentanyl  -lithium  -NSAIDS, medicines for pain and inflammation, like ibuprofen or naproxen  -phentermine  -procarbazine  -sibutramine  -St. John's wort  -theophylline  -tramadol  -tryptophan  This list may not describe all possible interactions. Give your health care provider a list of all the medicines, herbs, non-prescription drugs, or dietary supplements you use. Also tell them if you smoke, drink alcohol, or use illegal drugs. Some items may interact with your medicine.  What should I watch for while using this medicine?  Tell your doctor if your symptoms do not get better or if they get worse. Visit your doctor or health care professional for regular checks on your progress. Because it may   take several weeks to see the full effects of this medicine, it is important to continue your treatment as prescribed by your doctor.  Patients and their families should watch out for new or worsening thoughts of suicide or depression. Also watch out for sudden changes in feelings such as feeling anxious,  agitated, panicky, irritable, hostile, aggressive, impulsive, severely restless, overly excited and hyperactive, or not being able to sleep. If this happens, especially at the beginning of treatment or after a change in dose, call your health care professional.  You may get drowsy or dizzy. Do not drive, use machinery, or do anything that needs mental alertness until you know how this medicine affects you. Do not stand or sit up quickly, especially if you are an older patient. This reduces the risk of dizzy or fainting spells. Alcohol may interfere with the effect of this medicine. Avoid alcoholic drinks.  This medicine can cause an increase in blood pressure. Check with your doctor for instructions on monitoring your blood pressure while taking this medicine.  Your mouth may get dry. Chewing sugarless gum or sucking hard candy, and drinking plenty of water may help. Contact your doctor if the problem does not go away or is severe.  What side effects may I notice from receiving this medicine?  Side effects that you should report to your doctor or health care professional as soon as possible:  -allergic reactions like skin rash, itching or hives, swelling of the face, lips, or tongue  -changes in blood pressure  -confusion  -dark urine  -dizziness  -fast talking and excited feelings or actions that are out of control  -fast, irregular heartbeat  -fever  -general ill feeling or flu-like symptoms  -hallucination, loss of contact with reality  -light-colored stools  -loss of balance or coordination  -redness, blistering, peeling or loosening of the skin, including inside the mouth  -right upper belly pain  -seizures  -suicidal thoughts or other mood changes  -trouble concentrating  -trouble passing urine or change in the amount of urine  -unusual bleeding or bruising  -unusually weak or tired  -yellowing of the eyes or skin  Side effects that usually do not require medical attention (report to your doctor or health care  professional if they continue or are bothersome):  -blurred vision  -change in appetite  -change in sex drive or performance  -headache  -increased sweating  -nausea  This list may not describe all possible side effects. Call your doctor for medical advice about side effects. You may report side effects to FDA at 1-800-FDA-1088.  Where should I keep my medicine?  Keep out of the reach of children.  Store at room temperature between 15 and 30 degrees C (59 and 86 degrees F). Throw away any unused medicine after the expiration date.  NOTE: This sheet is a summary. It may not cover all possible information. If you have questions about this medicine, talk to your doctor, pharmacist, or health care provider.  © 2014, [employer]. (2012-12-01 13:05:23)

## 2013-08-17 NOTE — Progress Notes (Addendum)
Hematology and Oncology Follow Up Visit  [name] [id number] [birth year] 73 y.o. 08/19/2013 1:24 PM     Principle Diagnosis:[name] 73 y.o. 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     Prior Therapy: #1Patient had recently hysterectomy/BSO for endometrial cancer at [facility]. She has some difficulty recovering from it wasn't a skilled nursing facility for a little while. She felt a possible mass within the left breast and had mammogram and ultrasound performed that were suspicious. 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 On 12/26/2012 patient had an MRI performed that showed a 1.9 cm irregular enhancing mass within the left breast at the 12:00 position. She was seen by Dr. Rolm Bookbinder and on a 21 2014 patient underwent a lumpectomy with sentinel lymph node biopsy. Her final pathology revealed invasive ductal carcinoma measuring 1.9 cm all margins were negative. Sentinel node was negative. Final pathologic staging was T1 C. N0 M0 (stage I).   #2 patient had staging studies performed and were negative for any evidence of metastatic disease.   #3 patient had Oncotype DX testing performed her recurrence score was low at 10 giving her a 7% risk of distant recurrence with tamoxifen therapy alone.   #4 S/P Radiation therapy between 03/20/13 - 05/03/13   #5 Patient began arimidex 1 mg daily starting 05/31/13 x 5 years, curative intent.  We have held her Arimidex beginning on 07/24/13 due to increased joint pain, diarrhea, and numbness.   Current therapy:  observation  Interim History: [name] 73 y.o. female who is here for f/u after being off of Arimidex x 1 month.  She was suffering from diarrhea, numbness, change of ROM in her left shoulder, and pain.  She has been off of Arimidex x 1 month and feels like she is worsening.   She is very upset because she  continues to have pain across her shoulders and in her neck that is making it difficult for her to move.  The diarrhea and numbness isn't as severe, however the pain remains.  She continues to go to physical therapy, but has a very difficult time with it.    Medications:  Current Outpatient Prescriptions  Medication Sig Dispense Refill  . buPROPion (WELLBUTRIN XL) 150 MG 24 hr tablet Take 150 mg by mouth 2 (two) times daily.       . Calcium Carbonate-Vitamin D (CALTRATE 600+D) 600-400 MG-UNIT per tablet Take 1 tablet by mouth daily.      . carisoprodol (SOMA) 350 MG tablet Take 350 mg by mouth 2 (two) times daily.       Marland Kitchen FIBER SELECT GUMMIES PO Take 2 tablets by mouth every morning.      . metoprolol succinate (TOPROL-XL) 50 MG 24 hr tablet Take 50 mg by mouth 2 (two) times daily.       . Multiple Vitamin (MULTIVITAMIN WITH MINERALS) TABS tablet Take 1 tablet by mouth daily.      . RESTASIS 0.05 % ophthalmic emulsion Place 1 drop into both eyes 2 (two) times daily.       . Vitamin D, Ergocalciferol, (DRISDOL) 50000 UNITS CAPS capsule Take 50,000 Units by mouth. Take Tuesdays and Thursdays.      Marland Kitchen acetaminophen-codeine (TYLENOL #3) 300-30 MG per tablet Take 1 tablet by mouth every 6 (six) hours as needed for pain.      Marland Kitchen ALPRAZolam (XANAX) 0.25 MG tablet  Take 0.25 mg by mouth at bedtime as needed.       . Diclofenac Sodium (PENNSAID) 1.5 % SOLN Place 1 Bottle onto the skin daily as needed. For pain      . DULoxetine (CYMBALTA) 20 MG capsule Take 1 capsule (20 mg total) by mouth daily.  30 capsule  3  . hyaluronate sodium (RADIAPLEXRX) GEL Apply 1 application topically 2 (two) times daily. Add to affected skin area after rad tx and in am after your shower/bath, just not 4 hours prior to coming for rad tx      . HYDROcodone-acetaminophen (NORCO/VICODIN) 5-325 MG per tablet Take 1 tablet by mouth every 6 (six) hours as needed for moderate pain.  30 tablet  0  . non-metallic deodorant (ALRA) MISC  Apply 1 application topically daily as needed.      . silver sulfADIAZINE (SILVADENE) 1 % cream Apply 1 application topically 2 (two) times daily. Apply 2x day, after da tx and bedtime, must remove cream before re-applying to affected skin area       No current facility-administered medications for this visit.     Allergies:  Allergies  Allergen Reactions  . Iodine Rash    Medical History: Past Medical History  Diagnosis Date  . Hyperlipidemia   . Hypertension   . Cold     just started 01/02/13 cough  . Complication of anesthesia     very phobic about needles.last surgery had to sedate before doing iv  . Exertional shortness of breath   . Uterine cancer   . Chronic lower back pain   . Depression   . Arthritis     "joints; mostly on the right side" (01/11/2013)  . Breast cancer     "left" (01/11/2013)  . Cancer     endometrial  . Allergy   . Osteoporosis     knees   . Status post radiation therapy within last four weeks 03/19/13-05/03/13    lt breast 60.4GY    Surgical History:  Past Surgical History  Procedure Laterality Date  . Replacement total knee Bilateral 2011-2012  . Foot tendon surgery Right   . Breast lumpectomy with needle localization and axillary sentinel lymph node bx Left 01/11/2013  . Breast biopsy Left 12/2012  . Abdominal hysterectomy  12/14/2012  . Dilation and curettage of uterus      "@ least one" (01/11/2013)  . Tubal ligation    . Cholecystectomy  1970  . Knee arthroscopy Right 1990's    "twice" (01/11/2013)  . Breast lumpectomy with needle localization and axillary sentinel lymph node bx Left 01/11/2013    Procedure: BREAST LUMPECTOMY WITH NEEDLE LOCALIZATION AND AXILLARY SENTINEL LYMPH NODE BX;  Surgeon: Rolm Bookbinder, MD;  Location: [place];  Service: General;  Laterality: Left;     Review of Systems: A 10 point review of systems was conducted and is otherwise negative except for what is noted above.     Physical Exam: Blood pressure  144/86, pulse 99, temperature 97.8 F (36.6 C), temperature source Oral, resp. rate 18, height '5\' 7"'  (1.702 m), weight 314 lb 3.2 oz (142.52 kg). Exam limited, patient in wheelchair GENERAL: Patient is a well appearing female in no acute distress HEENT:  Sclerae anicteric.  Oropharynx clear and moist. No ulcerations or evidence of oropharyngeal candidiasis. Neck is supple.  NODES:  No cervical, supraclavicular, or axillary lymphadenopathy palpated.  BREAST EXAM:  Deferred. LUNGS:  Clear to auscultation bilaterally.  No wheezes or rhonchi. HEART:  Regular rate and rhythm. No murmur appreciated. ABDOMEN:  Soft, nontender.  Positive, normoactive bowel sounds. No organomegaly palpated. EXTREMITIES:  No peripheral edema.   SKIN:  Clear with no obvious rashes or skin changes. No nail dyscrasia. NEURO:  Nonfocal. Well oriented.  Appropriate affect. ECOG PERFORMANCE STATUS: 2 - Symptomatic, <50% confined to bed   Lab Results: Lab Results  Component Value Date   WBC 7.5 07/24/2013   HGB 13.2 07/24/2013   HCT 41.7 07/24/2013   MCV 83.6 07/24/2013   PLT 202 07/24/2013     Chemistry      Component Value Date/Time   NA 143 07/24/2013 1259   NA 139 01/05/2013 1357   K 4.3 07/24/2013 1259   K 4.1 01/05/2013 1357   CL 102 01/05/2013 1357   CO2 25 07/24/2013 1259   CO2 26 01/05/2013 1357   BUN 15.3 07/24/2013 1259   BUN 12 01/05/2013 1357   CREATININE 1.0 07/24/2013 1259   CREATININE 0.95 01/11/2013 1422      Component Value Date/Time   CALCIUM 10.1 07/24/2013 1259   CALCIUM 9.6 01/05/2013 1357   ALKPHOS 89 07/24/2013 1259   ALKPHOS 71 03/31/2010 1115   AST 16 07/24/2013 1259   AST 21 03/31/2010 1115   ALT 34 07/24/2013 1259   ALT 21 03/31/2010 1115   BILITOT 0.40 07/24/2013 1259   BILITOT 0.7 03/31/2010 1115       Assessment and Plan: [name] 73 y.o. female with  1. Stage IA ER/PR positive invasive ductal carcinoma of the left breast.  Due to her continued pain in her neck and shoulders, we will continue to  hold the Arimidex.    2.  Shoulder and neck pain:  Due to this I ordered a bone scan to ensure she doesn't have any bony metastases.  I also gave her a prescription for Norco #30 today.  I also prescribed Cymbalta as an adjunct for her pain as recommended by Dr. Humphrey Rolls.  We also recommended she f/u with Dr. Karlton Lemon about her pain.  She is in agreement with the plan.  3.  Psychosocial:  The patient has a lot of issues with home environment that I was alerted to by Dr. Humphrey Rolls.  I called Lauren, our social worker who spent a great deal of time with the patient talking to her and working with her.    The patient will return in two months for labs and evaluation.  She knows to call us if she has any questions or concerns or if she needs to be seen any sooner than her next appointment.    I spent 25 minutes counseling the patient face to face.  The total time spent in the appointment was 30 minutes.  Minette Headland, [place] [phone] 08/19/2013 1:24 PM    ADDENDUM:  I personally saw this patient and performed a substantive portion of this encounter with the listed APP documented above.   With stage IA ER positive invasive ductal carcinoma of the left breast status post lumpectomy followed by radiation therapy. Patient did begin arimidex but then subsequently developed aches and pains. We initially thought that this may be due to the arimidex. and therefore the remote ask was held. But in spite of that she continues to have shoulder and neck pain. She is being seen by physical therapy but in spite of that she is having issues. I do think that there are a lot of emotional issues  as well could possibly be leading to all of her symptoms.  However to rule out any evidence of recurrent disease we will get a bone scan performed to ensure that she does not have any bony metastases. Patient also given a prescription for Norco #30 today. Also recommended starting her  on Cymbalta for aches and pains and possibly even underlying depression. Patient agreed with this. Patient today was also seen by our social worker to discuss patient's home environment. And appropriate support will be placed. Patient is also recommended to followup with Dr. Karlton Lemon her primary care physician.  As far as the breast cancer is concerned until we can resolve patient's other issues we will continue to hold the arimidex. Hopefully with her next visit in 2 months we will be able to restart some kind of aromatase inhibitor. We may possibly consider doing Aromasin.  Marcy Panning, MD

## 2013-08-21 ENCOUNTER — Telehealth: Payer: Self-pay | Admitting: Adult Health

## 2013-08-21 NOTE — Telephone Encounter (Signed)
, °

## 2013-08-22 ENCOUNTER — Telehealth: Payer: Self-pay | Admitting: Oncology

## 2013-08-22 ENCOUNTER — Telehealth: Payer: Self-pay

## 2013-08-22 NOTE — Telephone Encounter (Signed)
Returned pt call.  Per last ofc note by LC, pt to see PCP about pain.  Bone scan has been scheduled.  Pt requesting that PT be extended - let pt know I would check with LC.  Routed to [facility].

## 2013-08-22 NOTE — Telephone Encounter (Signed)
Its fine to extend PT

## 2013-08-22 NOTE — Telephone Encounter (Signed)
per 3/31 staff message from [facility] pt f/u should be 30mos cx other appt. april appt cxd pt will keep may appt. s/w pt she is aware - confirmed 5/28 appt.

## 2013-08-28 ENCOUNTER — Encounter (HOSPITAL_COMMUNITY)
Admission: RE | Admit: 2013-08-28 | Discharge: 2013-08-28 | Disposition: A | Discharge status: Home / Self Care | Payer: MEDICARE | Source: Ambulatory Visit | Attending: Adult Health | Admitting: Adult Health

## 2013-08-28 DIAGNOSIS — C50412 Malignant neoplasm of upper-outer quadrant of left female breast: Secondary | ICD-10-CM

## 2013-08-28 DIAGNOSIS — M25519 Pain in unspecified shoulder: Secondary | ICD-10-CM | POA: Insufficient documentation

## 2013-08-28 DIAGNOSIS — M542 Cervicalgia: Secondary | ICD-10-CM | POA: Insufficient documentation

## 2013-08-28 DIAGNOSIS — W19XXXA Unspecified fall, initial encounter: Secondary | ICD-10-CM | POA: Insufficient documentation

## 2013-08-28 DIAGNOSIS — C50919 Malignant neoplasm of unspecified site of unspecified female breast: Secondary | ICD-10-CM | POA: Insufficient documentation

## 2013-08-28 MED ORDER — TECHNETIUM TC 99M MEDRONATE IV KIT
26.0000 | PACK | Freq: Once | INTRAVENOUS | Status: AC | PRN
  Administered 2013-08-28: 26 via INTRAVENOUS

## 2013-09-10 ENCOUNTER — Telehealth: Payer: Self-pay | Admitting: *Deleted

## 2013-09-10 NOTE — Telephone Encounter (Signed)
This RN tried calling patient to inform her that her bone density test results show no cancer per Dr. Humphrey Rolls. Only arthritic pain. Home number was busy.

## 2013-09-12 ENCOUNTER — Other Ambulatory Visit: Payer: Medicare Other

## 2013-09-12 ENCOUNTER — Ambulatory Visit: Payer: Medicare Other | Admitting: Oncology

## 2013-09-13 ENCOUNTER — Telehealth: Payer: Self-pay | Admitting: *Deleted

## 2013-09-13 NOTE — Telephone Encounter (Signed)
Patient called and left a voice message regarding Arimidex. Patient stated, " I want to take something like Arimidex, but I don't won't to restart Arimidex because that gave me pains in my neck, shoulders and joints." Per [name], NP, she needs to see Dr. Karlton Lemon about the pain before we start her on another drug. Patient has already scheduled an appointment with Dr. Karlton Lemon regarding pain. Patient is to call this office after visit with Dr. Karlton Lemon to discuss other options beside Arimidex. Patient verbalized understanding.

## 2013-10-08 ENCOUNTER — Telehealth: Payer: Self-pay

## 2013-10-09 ENCOUNTER — Telehealth: Payer: Self-pay

## 2013-10-09 NOTE — Telephone Encounter (Signed)
Let pt know I had received her message and put question on Dr Starleen Arms desk.  Let her know I would call her back as soon as I have an answer.  Confirmed appointments on 5/28.  Pt voiced understanding.

## 2013-10-10 NOTE — Telephone Encounter (Signed)
Charting error.

## 2013-10-17 ENCOUNTER — Other Ambulatory Visit: Payer: Self-pay | Admitting: *Deleted

## 2013-10-17 DIAGNOSIS — C50412 Malignant neoplasm of upper-outer quadrant of left female breast: Secondary | ICD-10-CM

## 2013-10-18 ENCOUNTER — Other Ambulatory Visit (HOSPITAL_BASED_OUTPATIENT_CLINIC_OR_DEPARTMENT_OTHER): Payer: MEDICARE

## 2013-10-18 ENCOUNTER — Telehealth: Payer: Self-pay | Admitting: Oncology

## 2013-10-18 ENCOUNTER — Ambulatory Visit (HOSPITAL_BASED_OUTPATIENT_CLINIC_OR_DEPARTMENT_OTHER): Payer: MEDICARE | Admitting: Oncology

## 2013-10-18 VITALS — BP 155/91 | HR 99 | Temp 97.9°F | Resp 20 | Ht 67.0 in | Wt 307.4 lb

## 2013-10-18 DIAGNOSIS — C50412 Malignant neoplasm of upper-outer quadrant of left female breast: Secondary | ICD-10-CM

## 2013-10-18 DIAGNOSIS — C50419 Malignant neoplasm of upper-outer quadrant of unspecified female breast: Secondary | ICD-10-CM

## 2013-10-18 LAB — CBC WITH DIFFERENTIAL/PLATELET
BASO%: 0.1 % (ref 0.0–2.0)
Basophils Absolute: 0 10*3/uL (ref 0.0–0.1)
EOS%: 0.6 % (ref 0.0–7.0)
Eosinophils Absolute: 0.1 10*3/uL (ref 0.0–0.5)
HEMATOCRIT: 41.8 % (ref 34.8–46.6)
HGB: 13.3 g/dL (ref 11.6–15.9)
LYMPH%: 14.6 % (ref 14.0–49.7)
MCH: 26.9 pg (ref 25.1–34.0)
MCHC: 31.8 g/dL (ref 31.5–36.0)
MCV: 84.6 fL (ref 79.5–101.0)
MONO#: 0.6 10*3/uL (ref 0.1–0.9)
MONO%: 7.3 % (ref 0.0–14.0)
NEUT#: 6.1 10*3/uL (ref 1.5–6.5)
NEUT%: 77.4 % — AB (ref 38.4–76.8)
Platelets: 188 10*3/uL (ref 145–400)
RBC: 4.94 10*6/uL (ref 3.70–5.45)
RDW: 15.4 % — ABNORMAL HIGH (ref 11.2–14.5)
WBC: 7.9 10*3/uL (ref 3.9–10.3)
lymph#: 1.2 10*3/uL (ref 0.9–3.3)

## 2013-10-18 LAB — COMPREHENSIVE METABOLIC PANEL (CC13)
ALK PHOS: 77 U/L (ref 40–150)
ALT: 16 U/L (ref 0–55)
AST: 12 U/L (ref 5–34)
Albumin: 3.2 g/dL — ABNORMAL LOW (ref 3.5–5.0)
Anion Gap: 13 mEq/L — ABNORMAL HIGH (ref 3–11)
BILIRUBIN TOTAL: 0.41 mg/dL (ref 0.20–1.20)
BUN: 20.8 mg/dL (ref 7.0–26.0)
CO2: 22 mEq/L (ref 22–29)
Calcium: 9.6 mg/dL (ref 8.4–10.4)
Chloride: 107 mEq/L (ref 98–109)
Creatinine: 1.3 mg/dL — ABNORMAL HIGH (ref 0.6–1.1)
Glucose: 196 mg/dl — ABNORMAL HIGH (ref 70–140)
Potassium: 4.2 mEq/L (ref 3.5–5.1)
Sodium: 142 mEq/L (ref 136–145)
Total Protein: 7 g/dL (ref 6.4–8.3)

## 2013-10-18 MED ORDER — EXEMESTANE 25 MG PO TABS: 25.0000 mg | ORAL_TABLET | Freq: Every day | ORAL | Status: DC

## 2013-10-18 NOTE — Telephone Encounter (Signed)
, °

## 2013-10-18 NOTE — Progress Notes (Signed)
[place]  [phone] [phone]) [phone]     ID: [name] OB: [birth year]  MR#: [id number]  OEU#:[id number]  PCP: Salena Leon., MD GYN:  Genia Del SU: Rolm Bookbinder OTHER MD:  Kyung Rudd   CHIEF COMPLAINT: Early stage breast cancer TREATMENT: On antiestrogen therapy  BREAST CANCER HISTORY: From Dr Jodelle Gross initial consult note 01/29/2013:  "[name] is a 73 y.o. female who is seen for an initial consultation visit. The patient has had multiple medical issues recently. By report the patient proceeded with a hysterectomy/BSO for endometrial cancer recently at [facility]. The patient did have some difficulty recovering from this and was in a skilled nursing facility for a short period of time. She then thought that she felt a possible mass within the left breast and she sought further evaluation for this. The patient proceeded with a mammogram and ultrasound which corresponded to suspicious findings.a core biopsy was obtained and this returned positive for invasive ductal carcinoma. Receptor studies have indicated that the tumor is ER positive, PR positive, and HER-2/neu negative. She then proceeded to undergo an MRI scan of the breasts bilaterally on 12/26/2012. This revealed a 1.9 cm irregular enhancing mass within the left breast at the 12:00 position. This corresponded to the recently diagnosed invasive mammary carcinoma.   The patient was seen by Dr. Donne Hazel and she proceeded to perform a lumpectomy and sentinel lymph node evaluation on 01/11/2013. The patient's pathology returned positive for an invasive ductal carcinoma measuring 1.9 cm. The margins were negative with the closest margin being 1 cm. The sentinel lymph nodes were negative. This therefore represented a T1 C. N0 M0 tumor. "  Her subsequent history is as detailed below  INTERVAL HISTORY: [name] returns today for followup of her breast cancer. At  the last visit here her anastrozole was stopped. She did notice some improvement on her symptoms, but she has significant arthritis issues and it can be difficult for patients sometimes to sort out what is due to the antiestrogen and what is due to simple degenerative disease. She is here today to establish herself in my practice.   REVIEW OF SYSTEMS: [name] continues to have pain on both shoulders and in her back. This is "on and off", and it is not currently more intense or persistent than before. 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 She tells me she has poor circulation problems. She gets short of breath when walking up stairs but she can get to the top of the stairs without stopping. She sleeps on 3 pillows. She has a dry cough, but no fever, phlegm production, pleurisy or hemoptysis. She thinks the cough is due to seasonal allergies. She describes her appetite is poor. He is having some loose bowel movements. She has stress urinary incontinence. She bruises easily. She describes herself is anxious and depressed. She has moderate problems from hot flashes. A detailed review of systems today was otherwise stable   PAST MEDICAL HISTORY: Past Medical History  Diagnosis Date  . Hyperlipidemia   . Hypertension   . Cold     just started 01/02/13 cough  . Complication of anesthesia     very phobic about needles.last surgery had to sedate before doing iv  . Exertional shortness of breath   . Uterine cancer   . Chronic lower back pain   . Depression   . Arthritis     "joints; mostly on the right  side" (01/11/2013)  . Breast cancer     "left" (01/11/2013)  . Cancer     endometrial  . Allergy   . Osteoporosis     knees   . Status post radiation therapy within last four weeks 03/19/13-05/03/13    lt breast 60.4GY    PAST SURGICAL HISTORY: Past Surgical History  Procedure Laterality Date  . Replacement total knee Bilateral 2011-2012  . Foot tendon  surgery Right   . Breast lumpectomy with needle localization and axillary sentinel lymph node bx Left 01/11/2013  . Breast biopsy Left 12/2012  . Abdominal hysterectomy  12/14/2012  . Dilation and curettage of uterus      "@ least one" (01/11/2013)  . Tubal ligation    . Cholecystectomy  1970  . Knee arthroscopy Right 1990's    "twice" (01/11/2013)  . Breast lumpectomy with needle localization and axillary sentinel lymph node bx Left 01/11/2013    Procedure: BREAST LUMPECTOMY WITH NEEDLE LOCALIZATION AND AXILLARY SENTINEL LYMPH NODE BX;  Surgeon: Rolm Bookbinder, MD;  Location: [place];  Service: General;  Laterality: Left;    FAMILY HISTORY No family history on file.  the patient does not know the cause of death of either parent. She was a foster child and really knows very little about her family. She has some half sisters that she is aware of, but does not know them very well.   GYNECOLOGIC HISTORY:   menarche age 24, first live birth age 22, the patient is [name] P2. She went through the change of life around the age of 69. She took hormone replacement through the women's health study. 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 She is divorced, and lives by herself, with no pets. Daughter [name] unfortunately died, sudden death, cause unclear. Son [name] lives in [place] and has a Designer, jewellery in psychology. The patient has no grandchildren. She is not a Ambulance person.    ADVANCED DIRECTIVES: Not in place but the patient intends to name her son as her healthcare power of attorney. He can be reached at [phone]. At the 10/10/2013 visit the patient was given the appropriate documents to complete and notarize at her discretion   HEALTH MAINTENANCE: History  Substance Use Topics  . Smoking status: Never Smoker   . Smokeless tobacco: Never Used  . Alcohol Use: No     Colonoscopy:  PAP: Status post  hysterectomy and bilateral salpingo-oophorectomy   Bone density: April 2014, showed minimal osteopenia, with a T score of -1.1  Lipid panel:  Allergies  Allergen Reactions  . Iodine Rash    Current Outpatient Prescriptions  Medication Sig Dispense Refill  . acetaminophen-codeine (TYLENOL #3) 300-30 MG per tablet Take 1 tablet by mouth every 6 (six) hours as needed for pain.      Marland Kitchen ALPRAZolam (XANAX) 0.25 MG tablet Take 0.25 mg by mouth at bedtime as needed.       Marland Kitchen buPROPion (WELLBUTRIN XL) 150 MG 24 hr tablet Take 150 mg by mouth 2 (two) times daily.       . Calcium Carbonate-Vitamin D (CALTRATE 600+D) 600-400 MG-UNIT per tablet Take 1 tablet by mouth daily.      . carisoprodol (SOMA) 350 MG tablet Take 350 mg by mouth 2 (two) times daily.       . Diclofenac Sodium (PENNSAID) 1.5 % SOLN Place 1 Bottle onto the skin daily as needed. For pain      .  DULoxetine (CYMBALTA) 20 MG capsule Take 1 capsule (20 mg total) by mouth daily.  30 capsule  3  . FIBER SELECT GUMMIES PO Take 2 tablets by mouth every morning.      . hyaluronate sodium (RADIAPLEXRX) GEL Apply 1 application topically 2 (two) times daily. Add to affected skin area after rad tx and in am after your shower/bath, just not 4 hours prior to coming for rad tx      . HYDROcodone-acetaminophen (NORCO/VICODIN) 5-325 MG per tablet Take 1 tablet by mouth every 6 (six) hours as needed for moderate pain.  30 tablet  0  . metoprolol succinate (TOPROL-XL) 50 MG 24 hr tablet Take 50 mg by mouth 2 (two) times daily.       . Multiple Vitamin (MULTIVITAMIN WITH MINERALS) TABS tablet Take 1 tablet by mouth daily.      . non-metallic deodorant Jethro Poling) MISC Apply 1 application topically daily as needed.      . RESTASIS 0.05 % ophthalmic emulsion Place 1 drop into both eyes 2 (two) times daily.       . silver sulfADIAZINE (SILVADENE) 1 % cream Apply 1 application topically 2 (two) times daily. Apply 2x day, after da tx and bedtime, must remove cream  before re-applying to affected skin area      . Vitamin D, Ergocalciferol, (DRISDOL) 50000 UNITS CAPS capsule Take 50,000 Units by mouth. Take Tuesdays and Thursdays.       No current facility-administered medications for this visit.    OBJECTIVE: Elderly African American woman examined in a wheelchair Filed Vitals:   10/18/13 1323  BP: 155/91  Pulse: 99  Temp: 97.9 F (36.6 C)  Resp: 20     Body mass index is 48.13 kg/(m^2).    ECOG FS:2 - Symptomatic, <50% confined to bed  Ocular: Sclerae unicteric, pupils round and reactive Ear-nose-throat: Oropharynx clear and moist Lymphatic: No cervical or supraclavicular adenopathy Lungs no rales or rhonchi Heart regular rate and rhythm, no murmur appreciated Abd soft, obese, nontender, positive bowel sounds MSK no focal spinal tenderness, no joint edema Neuro: non-focal, well-oriented, appropriate affect Breasts: The right breast is unremarkable. The left breast is status post lumpectomy and radiation. There is no evidence of local recurrence. The left axilla is benign.  LAB RESULTS:  CMP     Component Value Date/Time   NA 142 10/18/2013 1206   NA 139 01/05/2013 1357   K 4.2 10/18/2013 1206   K 4.1 01/05/2013 1357   CL 102 01/05/2013 1357   CO2 22 10/18/2013 1206   CO2 26 01/05/2013 1357   GLUCOSE 196* 10/18/2013 1206   GLUCOSE 145* 01/05/2013 1357   BUN 20.8 10/18/2013 1206   BUN 12 01/05/2013 1357   CREATININE 1.3* 10/18/2013 1206   CREATININE 0.95 01/11/2013 1422   CALCIUM 9.6 10/18/2013 1206   CALCIUM 9.6 01/05/2013 1357   PROT 7.0 10/18/2013 1206   PROT 7.0 03/31/2010 1115   ALBUMIN 3.2* 10/18/2013 1206   ALBUMIN 3.6 03/31/2010 1115   AST 12 10/18/2013 1206   AST 21 03/31/2010 1115   ALT 16 10/18/2013 1206   ALT 21 03/31/2010 1115   ALKPHOS 77 10/18/2013 1206   ALKPHOS 71 03/31/2010 1115   BILITOT 0.41 10/18/2013 1206   BILITOT 0.7 03/31/2010 1115   GFRNONAA 59* 01/11/2013 1422   GFRAA 69* 01/11/2013 1422    I No results found for this  basename: SPEP, UPEP,  kappa and lambda light chains    Lab Results  Component Value Date   WBC 7.9 10/18/2013   NEUTROABS 6.1 10/18/2013   HGB 13.3 10/18/2013   HCT 41.8 10/18/2013   MCV 84.6 10/18/2013   PLT 188 10/18/2013      Chemistry      Component Value Date/Time   NA 142 10/18/2013 1206   NA 139 01/05/2013 1357   K 4.2 10/18/2013 1206   K 4.1 01/05/2013 1357   CL 102 01/05/2013 1357   CO2 22 10/18/2013 1206   CO2 26 01/05/2013 1357   BUN 20.8 10/18/2013 1206   BUN 12 01/05/2013 1357   CREATININE 1.3* 10/18/2013 1206   CREATININE 0.95 01/11/2013 1422      Component Value Date/Time   CALCIUM 9.6 10/18/2013 1206   CALCIUM 9.6 01/05/2013 1357   ALKPHOS 77 10/18/2013 1206   ALKPHOS 71 03/31/2010 1115   AST 12 10/18/2013 1206   AST 21 03/31/2010 1115   ALT 16 10/18/2013 1206   ALT 21 03/31/2010 1115   BILITOT 0.41 10/18/2013 1206   BILITOT 0.7 03/31/2010 1115       No results found for this basename: LABCA2    No components found with this basename: LABCA125    No results found for this basename: INR,  in the last 168 hours  Urinalysis    Component Value Date/Time   COLORURINE AMBER BIOCHEMICALS MAY BE AFFECTED BY COLOR* 03/31/2010 1116   APPEARANCEUR CLOUDY* 03/31/2010 1116   LABSPEC 1.031* 03/31/2010 1116   PHURINE 5.5 03/31/2010 1116   GLUCOSEU NEGATIVE 03/31/2010 1116   [place] 03/31/2010 1116   [place] 03/31/2010 1116   [place] 03/31/2010 1116   [place] 03/31/2010 1116   UROBILINOGEN 0.2 03/31/2010 1116   NITRITE NEGATIVE 03/31/2010 1116   LEUKOCYTESUR NEGATIVE MICROSCOPIC NOT DONE ON URINES WITH NEGATIVE PROTEIN, BLOOD, LEUKOCYTES, NITRITE, OR GLUCOSE <1000 mg/dL. 03/31/2010 1116    STUDIES: CT/angio and CT scan of the abdomen and pelvis 02/08/2013 were reviewed with the patient. This showed no evidence of metastatic disease. Bone scan 08/28/2013 showed degenerative disease particularly in the mid thoracic spine but no evidence of  metastatic disease.  ASSESSMENT: 73 y.o. [place] woman status post left lumpectomy and sentinel lymph node sampling 01/11/2013 for a pT1c pN0, stage IA invasive ductal carcinoma, grade 1, estrogen receptor 100% positive, progesterone receptor 79% positive, with an MIB-1 of 17% and no HER-2 amplification.  (1) Oncotype DX score of 10 predicts an outside the breast risk of recurrence within 10 years of 7% if the patient's only systemic treatment is tamoxifen for 5 years. It also predicts no benefit from chemotherapy.  (2) the patient completed adjuvant radiation December 2014  (3) on anastrozole and January through March of 2015, discontinued because of side effects  (4) exemestane started June 2015  (5) status post robotic hysterectomy with bilateral salpingo-oophorectomy 12/14/2012 for a noninvasive grade 1 endometrial cancer   PLAN: I spent approximately  one hour with [name]  today going over her situation. She has a very favorable Oncotype score, and a good prognosis Overall. She understands though  that the  risk of recurrence quoted by Oncotype assumes that she takes at least tamoxifen for 5 years.   I would be uncomfortable using tamoxifen in this patient who is obese and sedentary. We discussed the fact that aromatase inhibitors are more effective than tamoxifen head-to-head. We are going to give exemestane a try. Of course she may develop the same problems from this agent as she had with the  anastrozole, but many patients who cannot tolerate one of the aromatase inhibitors do fine with a different one.  I asked [name] up to make a note of her symptoms over the next week before she starts to exemestane so we have the basis of comparison. She actually can make a chart with this symptom at the first column and then a reading of 1-10 initially, with the radiating repeated after she has been on exemestane for 6 weeks. She has a very good understanding of this plan, which may help [place] tease  out how much of her problem is due to arthritis and how much to the exemestane itself.  I have made her a return appointment with me in 3 months, but of course I will be glad to see her before then if any problems develop. Assuming she tolerates it exemestane well, the plan will be to continue this agent for 5 years.  [place] has a good understanding of the overall plan. She agrees with it. She knows the goal of treatment in her case is cure. She will call with any problems that may develop before her next visit here.  Chauncey Cruel, MD   10/18/2013 1:44 PM

## 2013-12-11 ENCOUNTER — Encounter (INDEPENDENT_AMBULATORY_CARE_PROVIDER_SITE_OTHER): Payer: Self-pay | Admitting: General Surgery

## 2014-02-06 ENCOUNTER — Ambulatory Visit (HOSPITAL_BASED_OUTPATIENT_CLINIC_OR_DEPARTMENT_OTHER): Payer: MEDICARE | Admitting: Oncology

## 2014-02-06 ENCOUNTER — Telehealth: Payer: Self-pay | Admitting: Oncology

## 2014-02-06 ENCOUNTER — Other Ambulatory Visit (HOSPITAL_BASED_OUTPATIENT_CLINIC_OR_DEPARTMENT_OTHER): Payer: MEDICARE

## 2014-02-06 VITALS — BP 150/67 | HR 95 | Temp 98.6°F | Resp 20 | Ht 67.0 in | Wt 301.1 lb

## 2014-02-06 DIAGNOSIS — C50419 Malignant neoplasm of upper-outer quadrant of unspecified female breast: Secondary | ICD-10-CM

## 2014-02-06 DIAGNOSIS — C50919 Malignant neoplasm of unspecified site of unspecified female breast: Secondary | ICD-10-CM

## 2014-02-06 DIAGNOSIS — C50412 Malignant neoplasm of upper-outer quadrant of left female breast: Secondary | ICD-10-CM

## 2014-02-06 DIAGNOSIS — Z170 Estrogen receptor positive status [ER+]: Secondary | ICD-10-CM

## 2014-02-06 LAB — CBC WITH DIFFERENTIAL/PLATELET
BASO%: 0.2 % (ref 0.0–2.0)
Basophils Absolute: 0 10*3/uL (ref 0.0–0.1)
EOS%: 0.7 % (ref 0.0–7.0)
Eosinophils Absolute: 0 10*3/uL (ref 0.0–0.5)
HEMATOCRIT: 42.1 % (ref 34.8–46.6)
HGB: 13.4 g/dL (ref 11.6–15.9)
LYMPH%: 18.5 % (ref 14.0–49.7)
MCH: 27.4 pg (ref 25.1–34.0)
MCHC: 31.8 g/dL (ref 31.5–36.0)
MCV: 86.1 fL (ref 79.5–101.0)
MONO#: 0.4 10*3/uL (ref 0.1–0.9)
MONO%: 7.7 % (ref 0.0–14.0)
NEUT#: 4 10*3/uL (ref 1.5–6.5)
NEUT%: 72.9 % (ref 38.4–76.8)
Platelets: 202 10*3/uL (ref 145–400)
RBC: 4.89 10*6/uL (ref 3.70–5.45)
RDW: 15.2 % — ABNORMAL HIGH (ref 11.2–14.5)
WBC: 5.5 10*3/uL (ref 3.9–10.3)
lymph#: 1 10*3/uL (ref 0.9–3.3)
nRBC: 0 % (ref 0–0)

## 2014-02-06 LAB — COMPREHENSIVE METABOLIC PANEL (CC13)
ALBUMIN: 3.3 g/dL — AB (ref 3.5–5.0)
ALT: 17 U/L (ref 0–55)
AST: 14 U/L (ref 5–34)
Alkaline Phosphatase: 70 U/L (ref 40–150)
Anion Gap: 11 mEq/L (ref 3–11)
BUN: 9.3 mg/dL (ref 7.0–26.0)
CALCIUM: 9.9 mg/dL (ref 8.4–10.4)
CHLORIDE: 106 meq/L (ref 98–109)
CO2: 27 mEq/L (ref 22–29)
Creatinine: 1.1 mg/dL (ref 0.6–1.1)
Glucose: 137 mg/dl (ref 70–140)
POTASSIUM: 3.9 meq/L (ref 3.5–5.1)
Sodium: 143 mEq/L (ref 136–145)
Total Bilirubin: 0.42 mg/dL (ref 0.20–1.20)
Total Protein: 7.2 g/dL (ref 6.4–8.3)

## 2014-02-06 MED ORDER — GABAPENTIN 300 MG PO CAPS: 300.0000 mg | ORAL_CAPSULE | Freq: Every day | ORAL | Status: DC

## 2014-02-06 NOTE — Progress Notes (Signed)
[place]  [phone] [phone]     ID: [name] OB: 08/22/1940  MR#: [id number]  ZHY#:[id number]  PCP: Salena Saner., MD GYN:  Genia Del SU: Rolm Bookbinder OTHER MD:  Kyung Rudd   CHIEF COMPLAINT: Early stage breast cancer TREATMENT: On antiestrogen therapy  BREAST CANCER HISTORY: From Dr [name] initial consult note 01/29/2013:  "[name] is a 73 y.o. female who is seen for an initial consultation visit. The patient has had multiple medical issues recently. By report the patient proceeded with a hysterectomy/BSO for endometrial cancer recently at [facility]. The patient did have some difficulty recovering from this and was in a skilled nursing facility for a short period of time. She then thought that she felt a possible mass within the left breast and she sought further evaluation for this. The patient proceeded with a mammogram and ultrasound which corresponded to suspicious findings.a core biopsy was obtained and this returned positive for invasive ductal carcinoma. Receptor studies have indicated that the tumor is ER positive, PR positive, and HER-2/neu negative. She then proceeded to undergo an MRI scan of the breasts bilaterally on 12/26/2012. This revealed a 1.9 cm irregular enhancing mass within the left breast at the 12:00 position. This corresponded to the recently diagnosed invasive mammary carcinoma.   The patient was seen by Dr. Donne Leon and she proceeded to perform a lumpectomy and sentinel lymph node evaluation on 01/11/2013. The patient's pathology returned positive for an invasive ductal carcinoma measuring 1.9 cm. The margins were negative with the closest margin being 1 cm. The sentinel lymph nodes were negative. This therefore represented a T1 C. N0 M0 tumor. "  Her subsequent history is as detailed below  INTERVAL HISTORY: [name] returns today for followup of her breast cancer. At  the last visit here she was started on exemestane. She is paying $20 for a three-month supply, which is a very favorable clots. Aside from hot flashes, which are really not significantly worse than before, she reports no side effects from this medication.  REVIEW OF SYSTEMS: [name] is very depressed. She moved to this area to be near her daughter and then her daughter.there she was left ear with no support group, no acquaintances and no family. She did well with her radiation in rehabilitation her left upper extremity well, but now she has very limited range of motion there. She has pain in the left breast at times and this concerns her. The patient's are very fleeting. They are the typical "shooting" pains that people describe postoperatively. I have reassured her that these are not related to cancer per se furthermore due to the trauma or surgery. Unfortunately that may or may not go away. She sleeps poorly. She denies unusual headaches, visual changes, cough, phlegm production, pleurisy, or change in bowel or bladder habits. A detailed review of systems today was otherwise stable.  PAST MEDICAL HISTORY: Past Medical History  Diagnosis Date  . Hyperlipidemia   . Hypertension   . Cold     just started 01/02/13 cough  . Complication of anesthesia     very phobic about needles.last surgery had to sedate before doing iv  . Exertional shortness of breath   . Uterine cancer   . Chronic lower back pain   . Depression   . Arthritis     "joints; mostly on the right side" (01/11/2013)  . Breast cancer     "left" (01/11/2013)  . Cancer  endometrial  . Allergy   . Osteoporosis     knees   . Status post radiation therapy within last four weeks 03/19/13-05/03/13    lt breast 60.4GY    PAST SURGICAL HISTORY: Past Surgical History  Procedure Laterality Date  . Replacement total knee Bilateral 2011-2012  . Foot tendon surgery Right   . Breast lumpectomy with needle localization and axillary  sentinel lymph node bx Left 01/11/2013  . Breast biopsy Left 12/2012  . Abdominal hysterectomy  12/14/2012  . Dilation and curettage of uterus      "@ least one" (01/11/2013)  . Tubal ligation    . Cholecystectomy  1970  . Knee arthroscopy Right 1990's    "twice" (01/11/2013)  . Breast lumpectomy with needle localization and axillary sentinel lymph node bx Left 01/11/2013    Procedure: BREAST LUMPECTOMY WITH NEEDLE LOCALIZATION AND AXILLARY SENTINEL LYMPH NODE BX;  Surgeon: Rolm Bookbinder, MD;  Location: [facility];  Service: General;  Laterality: Left;    FAMILY HISTORY No family history on file.  the patient does not know the cause of death of either parent. She was a foster child and really knows very little about her family. She has some half sisters that she is aware of, but does not know them very well.   GYNECOLOGIC HISTORY:   menarche age 30, first live birth age 89, the patient is [name] P2. She went through the change of life around the age of 36. She took hormone replacement through the women's health study. 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 She is divorced, and lives by herself, with no pets. Daughter [name] unfortunately died, sudden death, cause unclear. Son [name] lives in [place] and has a Designer, jewellery in psychology. The patient has no grandchildren. She is not a Ambulance person.    ADVANCED DIRECTIVES: Not in place but the patient intends to name her son as her healthcare power of attorney. He can be reached at [phone]. At the 10/10/2013 visit the patient was given the appropriate documents to complete and notarize at her discretion   HEALTH MAINTENANCE: History  Substance Use Topics  . Smoking status: Never Smoker   . Smokeless tobacco: Never Used  . Alcohol Use: No     Colonoscopy:  PAP: Status post hysterectomy and bilateral salpingo-oophorectomy   Bone density: April 2014,  showed minimal osteopenia, with a T score of -1.1  Lipid panel:  Allergies  Allergen Reactions  . Iodine Rash    Current Outpatient Prescriptions  Medication Sig Dispense Refill  . acetaminophen-codeine (TYLENOL #3) 300-30 MG per tablet Take 1 tablet by mouth every 6 (six) hours as needed for pain.      Marland Kitchen ALPRAZolam (XANAX) 0.25 MG tablet Take 0.25 mg by mouth at bedtime as needed.       Marland Kitchen buPROPion (WELLBUTRIN XL) 150 MG 24 hr tablet Take 150 mg by mouth 2 (two) times daily.       . Calcium Carbonate-Vitamin D (CALTRATE 600+D) 600-400 MG-UNIT per tablet Take 1 tablet by mouth daily.      . carisoprodol (SOMA) 350 MG tablet Take 350 mg by mouth 2 (two) times daily.       . Diclofenac Sodium (PENNSAID) 1.5 % SOLN Place 1 Bottle onto the skin daily as needed. For pain      . DULoxetine (CYMBALTA) 20 MG capsule Take 1 capsule (20 mg total) by mouth  daily.  30 capsule  3  . exemestane (AROMASIN) 25 MG tablet Take 1 tablet (25 mg total) by mouth daily after breakfast.  90 tablet  4  . FIBER SELECT GUMMIES PO Take 2 tablets by mouth every morning.      . hyaluronate sodium (RADIAPLEXRX) GEL Apply 1 application topically 2 (two) times daily. Add to affected skin area after rad tx and in am after your shower/bath, just not 4 hours prior to coming for rad tx      . HYDROcodone-acetaminophen (NORCO/VICODIN) 5-325 MG per tablet Take 1 tablet by mouth every 6 (six) hours as needed for moderate pain.  30 tablet  0  . metoprolol succinate (TOPROL-XL) 50 MG 24 hr tablet Take 50 mg by mouth 2 (two) times daily.       . Multiple Vitamin (MULTIVITAMIN WITH MINERALS) TABS tablet Take 1 tablet by mouth daily.      . non-metallic deodorant [name]) MISC Apply 1 application topically daily as needed.      . RESTASIS 0.05 % ophthalmic emulsion Place 1 drop into both eyes 2 (two) times daily.       . silver sulfADIAZINE (SILVADENE) 1 % cream Apply 1 application topically 2 (two) times daily. Apply 2x day, after da tx  and bedtime, must remove cream before re-applying to affected skin area      . Vitamin D, Ergocalciferol, (DRISDOL) 50000 UNITS CAPS capsule Take 50,000 Units by mouth. Take Tuesdays and Thursdays.       No current facility-administered medications for this visit.    OBJECTIVE: Elderly African American woman examined in a wheelchair Filed Vitals:   02/06/14 1340  BP: 150/67  Pulse: 95  Temp: 98.6 F (37 C)  Resp: 20     Body mass index is 47.15 kg/(m^2).    ECOG FS:2 - Symptomatic, <50% confined to bed  Ocular: Sclerae unicteric, EOMs intact Ear-nose-throat: Oropharynx clear, no thrush or other lesions Lymphatic: No cervical or supraclavicular adenopathy Lungs no rales or rhonchi Heart regular rate and rhythm Abd soft, obese, nontender, positive bowel sounds MSK scoliosis but no focal spinal tenderness Neuro: non-focal, well-oriented, appropriate affect Breasts: The right breast is unremarkable. The left breast is status post lumpectomy and radiation. There is minimal hyperpigmentation remaining. There is no evidence of local recurrence. The left axilla is benign.  LAB RESULTS:  CMP     Component Value Date/Time   NA 142 10/18/2013 1206   NA 139 01/05/2013 1357   K 4.2 10/18/2013 1206   K 4.1 01/05/2013 1357   CL 102 01/05/2013 1357   CO2 22 10/18/2013 1206   CO2 26 01/05/2013 1357   GLUCOSE 196* 10/18/2013 1206   GLUCOSE 145* 01/05/2013 1357   BUN 20.8 10/18/2013 1206   BUN 12 01/05/2013 1357   CREATININE 1.3* 10/18/2013 1206   CREATININE 0.95 01/11/2013 1422   CALCIUM 9.6 10/18/2013 1206   CALCIUM 9.6 01/05/2013 1357   PROT 7.0 10/18/2013 1206   PROT 7.0 03/31/2010 1115   ALBUMIN 3.2* 10/18/2013 1206   ALBUMIN 3.6 03/31/2010 1115   AST 12 10/18/2013 1206   AST 21 03/31/2010 1115   ALT 16 10/18/2013 1206   ALT 21 03/31/2010 1115   ALKPHOS 77 10/18/2013 1206   ALKPHOS 71 03/31/2010 1115   BILITOT 0.41 10/18/2013 1206   BILITOT 0.7 03/31/2010 1115   GFRNONAA 59* 01/11/2013 1422   GFRAA  69* 01/11/2013 1422    I No results found for this basename: SPEP,  UPEP,   kappa and lambda light chains    Lab Results  Component Value Date   WBC 5.5 02/06/2014   NEUTROABS 4.0 02/06/2014   HGB 13.4 02/06/2014   HCT 42.1 02/06/2014   MCV 86.1 02/06/2014   PLT 202 02/06/2014      Chemistry      Component Value Date/Time   NA 142 10/18/2013 1206   NA 139 01/05/2013 1357   K 4.2 10/18/2013 1206   K 4.1 01/05/2013 1357   CL 102 01/05/2013 1357   CO2 22 10/18/2013 1206   CO2 26 01/05/2013 1357   BUN 20.8 10/18/2013 1206   BUN 12 01/05/2013 1357   CREATININE 1.3* 10/18/2013 1206   CREATININE 0.95 01/11/2013 1422      Component Value Date/Time   CALCIUM 9.6 10/18/2013 1206   CALCIUM 9.6 01/05/2013 1357   ALKPHOS 77 10/18/2013 1206   ALKPHOS 71 03/31/2010 1115   AST 12 10/18/2013 1206   AST 21 03/31/2010 1115   ALT 16 10/18/2013 1206   ALT 21 03/31/2010 1115   BILITOT 0.41 10/18/2013 1206   BILITOT 0.7 03/31/2010 1115       No results found for this basename: LABCA2    No components found with this basename: LABCA125    No results found for this basename: INR,  in the last 168 hours  Urinalysis    Component Value Date/Time   COLORURINE AMBER BIOCHEMICALS MAY BE AFFECTED BY COLOR* 03/31/2010 1116   APPEARANCEUR CLOUDY* 03/31/2010 1116   LABSPEC 1.031* 03/31/2010 1116   PHURINE 5.5 03/31/2010 1116   GLUCOSEU NEGATIVE 03/31/2010 1116   [place] 03/31/2010 1116   [place] 03/31/2010 1116   KETONESUR NEGATIVE 03/31/2010 1116   [place] 03/31/2010 1116   UROBILINOGEN 0.2 03/31/2010 1116   NITRITE NEGATIVE 03/31/2010 1116   LEUKOCYTESUR NEGATIVE MICROSCOPIC NOT DONE ON URINES WITH NEGATIVE PROTEIN, BLOOD, LEUKOCYTES, NITRITE, OR GLUCOSE <1000 mg/dL. 03/31/2010 1116    STUDIES: CT/angio and CT scan of the abdomen and pelvis 02/08/2013 were reviewed with the patient. This showed no evidence of metastatic disease. Bone scan 08/28/2013 showed degenerative disease  particularly in the mid thoracic spine but no evidence of metastatic disease.  ASSESSMENT: 72 y.o. [place] woman status post left lumpectomy and sentinel lymph node sampling 01/11/2013 for a pT1c pN0, stage IA invasive ductal carcinoma, grade 1, estrogen receptor 100% positive, progesterone receptor 79% positive, with an MIB-1 of 17% and no HER-2 amplification.  (1) Oncotype DX score of 10 predicts an outside the breast risk of recurrence within 10 years of 7% if the patient's only systemic treatment is tamoxifen for 5 years. It also predicts no benefit from chemotherapy.  (2) the patient completed adjuvant radiation December 2014  (3) on anastrozole and January through March of 2015, discontinued because of side effects  (4) exemestane started June 2015  (5) status post robotic hysterectomy with bilateral salpingo-oophorectomy 12/14/2012 for a noninvasive grade 1 endometrial cancer   PLAN: Odyssey has multiple significant problems but she is doing very well from a breast cancer point of view. She is tolerating the exemestane without any significant side effects and she is able to get it at a very good progress.  Accordingly the plan is going to be to continue exemestane for total of 5 years. She is can see [place] again in 6 months. Her next mammogram will be obtained in February of 2016  She is having a frozen left shoulder. I think she would benefit  from orthopedic followup. We're referring her to Dr. supple for that. I am also asking our social workers to contact [facility] and see if they can help her get some support in this community, possibly a senior citizens center activities, joining a free exercise program for seniors, etc.  Telma has a good understanding of the overall plan. She agrees with it. She knows the goal of treatment in her case is cure. She will call with any problems that may develop before her next visit here.  Chauncey Cruel, MD   02/06/2014 1:53 PM

## 2014-02-06 NOTE — Telephone Encounter (Signed)
[facility] PT APPT SCHEDULE FOR MARCH 2016 AND MAMMO 07/02/14. PT ALSO GIVEN APPT D/T/LOCATION/PHONE FOR DR SUPPLE 03/01/14 @ 3:30PM TO ARRIVE 3PM.

## 2014-02-06 NOTE — Addendum Note (Signed)
Addended by: Chauncey Cruel on: 02/06/2014 03:12 PM   Modules accepted: Orders

## 2014-02-07 ENCOUNTER — Encounter: Payer: Self-pay | Admitting: *Deleted

## 2014-02-07 NOTE — Progress Notes (Signed)
[facility]  Clinical Social Work was referred by Futures trader for assessment of psychosocial needs.  Clinical Social Worker contacted patient at home to offer support and assess for needs.  This CSW had worked with pt in the past and she was attending support group. She reports driving is not possible and her son is now out of town for work. Pt reports to still have access to [facility] services during the day. CSW asked solution focused questions and found pt is very interested in many of the activities available at [employer]. CSW mailed pt a copy of their calendar, as she does not have access to a computer. Pt is planning to use [facility] to get to their many activities and plans to call them as well. Pt denies current concerns with her depression and feels her meds are working well. Pt feels getting out of the house more often will help even more.  Pt aware to call as needed.   Clinical Social Work interventions: Solution focused interviewing Resource education  Loren Racer, [place] Worker [place]  [place] Phone: [phone] Fax: [phone]

## 2014-02-08 ENCOUNTER — Telehealth: Payer: Self-pay | Admitting: Emergency Medicine

## 2014-02-08 NOTE — Telephone Encounter (Signed)
Patient called inquiring about a "prescription" Dr Jana Hakim wrote for her for her hot flashes and shoulder. Per office visit note, it appears that Dr Jana Hakim sent an electronic RX to the patient's listed preferred pharmacy, [facility] on [address] in [place]. Patient states she will contact pharmacy to see if prescription is ready. Advised patient to notify this office if she has any problems or concerns regarding Rx. Patient verbalized understanding.

## 2014-02-08 NOTE — Addendum Note (Signed)
Addended by: Jaci Carrel A on: 02/08/2014 10:43 AM   Modules accepted: Orders

## 2014-03-08 ENCOUNTER — Ambulatory Visit (INDEPENDENT_AMBULATORY_CARE_PROVIDER_SITE_OTHER): Payer: MEDICARE | Admitting: General Surgery

## 2014-05-09 ENCOUNTER — Telehealth (INDEPENDENT_AMBULATORY_CARE_PROVIDER_SITE_OTHER): Payer: Self-pay

## 2014-05-09 DIAGNOSIS — C50912 Malignant neoplasm of unspecified site of left female breast: Secondary | ICD-10-CM

## 2014-05-09 NOTE — Telephone Encounter (Signed)
Pt seen in office today by Dr Donne Hazel and orders placed in epic for PT frozen left shoulder after br ca sx.

## 2014-05-27 ENCOUNTER — Ambulatory Visit: Payer: Medicare Other | Attending: General Surgery | Admitting: Physical Therapy

## 2014-05-27 DIAGNOSIS — R296 Repeated falls: Secondary | ICD-10-CM | POA: Diagnosis not present

## 2014-05-27 DIAGNOSIS — M25411 Effusion, right shoulder: Secondary | ICD-10-CM | POA: Insufficient documentation

## 2014-05-27 DIAGNOSIS — M6281 Muscle weakness (generalized): Secondary | ICD-10-CM | POA: Diagnosis not present

## 2014-05-27 DIAGNOSIS — M25511 Pain in right shoulder: Secondary | ICD-10-CM | POA: Diagnosis not present

## 2014-05-27 DIAGNOSIS — M24511 Contracture, right shoulder: Secondary | ICD-10-CM

## 2014-05-27 NOTE — Therapy (Signed)
[place], [place], [address] Phone: [phone]   Fax:  [phone]  Physical Therapy Evaluation  Patient Details  Name: [name] MRN: [id number] Date of Birth: August 11, 1940  Encounter Date: 05/27/2014      PT End of Session - 05/27/14 1818    Visit Number 1   Number of Visits 17   Date for PT Re-Evaluation 08/02/14   PT Start Time 1430   PT Stop Time 1515   PT Time Calculation (min) 45 min   Activity Tolerance Patient tolerated treatment well   Behavior During Therapy [facility] for tasks assessed/performed      Past Medical History  Diagnosis Date  . Hyperlipidemia   . Hypertension   . Cold     just started 01/02/13 cough  . Complication of anesthesia     very phobic about needles.last surgery had to sedate before doing iv  . Exertional shortness of breath   . Uterine cancer   . Chronic lower back pain   . Depression   . Arthritis     "joints; mostly on the right side" (01/11/2013)  . Breast cancer     "left" (01/11/2013)  . Cancer     endometrial  . Allergy   . Osteoporosis     knees   . Status post radiation therapy within last four weeks 03/19/13-05/03/13    lt breast 60.4GY    Past Surgical History  Procedure Laterality Date  . Replacement total knee Bilateral 2011-2012  . Foot tendon surgery Right   . Breast lumpectomy with needle localization and axillary sentinel lymph node bx Left 01/11/2013  . Breast biopsy Left 12/2012  . Abdominal hysterectomy  12/14/2012  . Dilation and curettage of uterus      "@ least one" (01/11/2013)  . Tubal ligation    . Cholecystectomy  1970  . Knee arthroscopy Right 1990's    "twice" (01/11/2013)  . Breast lumpectomy with needle localization and axillary sentinel lymph node bx Left 01/11/2013    Procedure: BREAST LUMPECTOMY WITH NEEDLE LOCALIZATION AND AXILLARY SENTINEL LYMPH NODE BX;  Surgeon: Rolm Bookbinder, MD;  Location: [facility];  Service:  General;  Laterality: Left;    There were no vitals taken for this visit.  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   How long can you sit comfortably? 45 minutes   How long can you stand comfortably? 3 seconds    How long can you walk comfortably? with walker, about 2-4 minutes   Patient Stated Goals to get back into water aerbics   Currently in Pain? Yes   Pain Location Generalized  across both shoulders, biceps, right hip, knee and calf    Pain Type Chronic pain   Pain Onset More than a month ago   Aggravating Factors  sometimes worse with the way she sits or if she sits too long.   Pain Relieving Factors topical solutions tend to make it better, "icy hot"          [facility] PT Assessment - 05/27/14 1501    Sit to Stand  Comments 3 sit to stand repetitions in 30 seconds with pain in right hip   Other:   Other/ Comments able to stand for 45 seconds   Posture/Postural Control   Posture/Postural Control Postural limitations   Postural Limitations Forward head;Rounded Shoulders;Increased lumbar lordosis;Flexed trunk;Anterior pelvic tilt   AROM   Right Shoulder Flexion 80 Degrees   Right Shoulder ABduction 60 Degrees   Right Shoulder Internal Rotation --  measured with arm at side, able to bring hand toward abdomen   Right Shoulder External Rotation --  with arm at side can only get about 10 degrees.    Left Shoulder Flexion 85 Degrees   Left Shoulder ABduction 68 Degrees   Left Shoulder Internal Rotation --  with arm at side, pt able to bring arm to abdomen   Left Shoulder External Rotation --  with arm at side about 15 degrees     Strength   Overall Strength Deficits   Overall Strength Comments decreased generalized strength   Right Shoulder Flexion 2+/5  painful   Right Shoulder Extension 3/5  painful           LYMPHEDEMA/ONCOLOGY QUESTIONNAIRE - 05/27/14 1454    Type   Cancer Type breast  cander    Surgeries   Lumpectomy Date 12/25/12   Other Surgery Date 12/25/12  hysterectomy   Number Lymph Nodes Removed 5   Treatment   Active Chemotherapy Treatment Yes   Past Radiation Treatment Yes   Date 04/26/13   What other symptoms do you have   Other Symptoms no swelling in arms, but is having swelling in ankles.           Katina Dung - 05/27/14 0001    Open a tight or new jar Severe difficulty   Do heavy household chores (wash walls, wash floors) Unable   Carry a shopping bag or briefcase Moderate difficulty   Wash your back Unable   Use a knife to cut food Moderate difficulty   Recreational activities in which you take some force or impact through your arm, shoulder, or hand (golf, hammering, tennis) Unable   During the past week, to what extent has your arm, shoulder or hand problem interfered with your normal social activities with family, friends, neighbors, or groups? Extremely   During the past week, to what extent has your arm, shoulder or hand problem limited your work or other regular daily activities Extremely   Arm, shoulder, or hand pain. Extreme   Tingling (pins and needles) in your arm, shoulder, or hand Extreme   Difficulty Sleeping Severe difficulty   DASH Score 86.36 %             Short Term Clinic Goals - 05/27/14 1816    CC Short Term Goal  #1   Title pt will report a decrease in pain by 25%   Time 4   Period Weeks   Status New   CC Short Term Goal  #2   Title pt will be able to perfomr 5 repetition of sit to stand in 30 seconds   Time 4   Period Weeks   Status New   CC Short Term Goal  #3   Title pt will have a decrase in Quick Dash Scoure by 5 points   Time  4   Status New   CC Short Term Goal  #4   Title pt will be able to perform a basic exercise program at home   Time 4  Status New             Long Term Clinic Goals - 2014-06-04 1830    CC Long Term Goal  #5   Title pt will have 125 degrees of right and left shoulder flexion for increased activities of daily living   Time [place] - 06-04-2014 1819    Clinical Impression Statement Ms Trovato returns to physical therapy with diffuse pain and postural dysfunction, generalized decrease in strength and mobility.  She is having diffciulty in self care and would like to consider having an OT consult possibly in about 4 weeks depending on how well she does with PT.    Pt will benefit from skilled therapeutic intervention in order to improve on the following deficits Abnormal gait;Decreased strength;Pain;Difficulty walking;Decreased activity tolerance;Decreased mobility;Impaired UE functional use;Decreased range of motion;Decreased balance;Decreased safety awareness;Postural dysfunction;Decreased endurance;Obesity   Rehab Potential Fair   Clinical Impairments Affecting Rehab Potential longstanding deconditioning and mobility impairment   PT Frequency 2x / week   PT Duration 8 weeks   PT Treatment/Interventions Therapeutic exercise;Balance training;Passive range of motion;Neuromuscular re-education;DME Instruction;Patient/family education;Gait training;Moist Heat;Manual techniques;Functional mobility training;Therapeutic activities   PT Next Visit Plan Core activation and strengthening, LE strenghening, possible use of moist heat to back and/right hip/ bed mobility. UE range of motion within pain limits   Consulted and Agree with Plan of Care Patient          G-Codes - 06-04-2014 1831    Functional Assessment Tool Used clincial judgement   Functional Limitation Other PT primary   Other PT Primary Current Status (S3159) At least 80 percent but less  than 100 percent impaired, limited or restricted   Other PT Primary Goal Status (Y5859) At least 40 percent but less than 60 percent impaired, limited or restricted       Problem List Patient Active Problem List   Diagnosis Date Noted  . Breast cancer of upper-outer quadrant of left female breast 01/24/2013  . Edema 01/09/2013  . Onychomycosis 01/09/2013  . Pain in joint, ankle and foot 01/09/2013   Donato Heinz. Owens Shark PT    [name] 06-04-14, 6:32 PM  [place], [place], [address] Phone: [phone]   Fax:  [phone]

## 2014-06-05 ENCOUNTER — Ambulatory Visit: Payer: Medicare Other | Admitting: Physical Therapy

## 2014-06-07 ENCOUNTER — Ambulatory Visit: Payer: Medicare Other | Admitting: Physical Therapy

## 2014-06-07 DIAGNOSIS — M25511 Pain in right shoulder: Secondary | ICD-10-CM

## 2014-06-07 DIAGNOSIS — R296 Repeated falls: Secondary | ICD-10-CM

## 2014-06-07 DIAGNOSIS — M25411 Effusion, right shoulder: Secondary | ICD-10-CM

## 2014-06-07 DIAGNOSIS — M6281 Muscle weakness (generalized): Secondary | ICD-10-CM

## 2014-06-07 DIAGNOSIS — M24511 Contracture, right shoulder: Secondary | ICD-10-CM

## 2014-06-07 NOTE — Therapy (Signed)
[place], [place], [address] Phone: [phone]   Fax:  [phone]  Physical Therapy Treatment  Patient Details  Name: [name] MRN: [id number] Date of Birth: Sep 23, 1940 Referring Provider:  Willey Blade, MD  Encounter Date: 06/07/2014      PT End of Session - 06/07/14 1233    Visit Number 2   Number of Visits 17   Date for PT Re-Evaluation 08/02/14   PT Start Time 1102   PT Stop Time 1147   PT Time Calculation (min) 45 min      Past Medical History  Diagnosis Date  . Hyperlipidemia   . Hypertension   . Cold     just started 01/02/13 cough  . Complication of anesthesia     very phobic about needles.last surgery had to sedate before doing iv  . Exertional shortness of breath   . Uterine cancer   . Chronic lower back pain   . Depression   . Arthritis     "joints; mostly on the right side" (01/11/2013)  . Breast cancer     "left" (01/11/2013)  . Cancer     endometrial  . Allergy   . Osteoporosis     knees   . Status post radiation therapy within last four weeks 03/19/13-05/03/13    lt breast 60.4GY    Past Surgical History  Procedure Laterality Date  . Replacement total knee Bilateral 2011-2012  . Foot tendon surgery Right   . Breast lumpectomy with needle localization and axillary sentinel lymph node bx Left 01/11/2013  . Breast biopsy Left 12/2012  . Abdominal hysterectomy  12/14/2012  . Dilation and curettage of uterus      "@ least one" (01/11/2013)  . Tubal ligation    . Cholecystectomy  1970  . Knee arthroscopy Right 1990's    "twice" (01/11/2013)  . Breast lumpectomy with needle localization and axillary sentinel lymph node bx Left 01/11/2013    Procedure: BREAST LUMPECTOMY WITH NEEDLE LOCALIZATION AND AXILLARY SENTINEL LYMPH NODE BX;  Surgeon: Rolm Bookbinder, MD;  Location: [facility];  Service: General;  Laterality: Left;    There were no vitals taken for this  visit.  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 Yes   Pain Score 7    Pain Location Generalized  across both shoulders back , right knee and calf                    [facility] Adult PT Treatment/Exercise - 06/07/14 1119    Bed Mobility   Bed Mobility Supine to Sit;Sit to Supine   Supine to Sit 4: Min assist   Supine to Sit Details (indicate cue type and reason) pt pulls up on therapist's arm   Sit to Supine 4: Min assist   Sit to Supine - Details (indicate cue type and reason) assit to lift legs up   Transfers   Transfers Sit to Stand;Stand to Sit   Sit to Stand 6: Modified independent (Device/Increase time)  extra time easier to stand from higher surface   Lumbar Exercises: Stretches   Single Knee to Chest Stretch 5  reps   Pelvic Tilt 5 reps   Lumbar Exercises: Supine   Ab Set 10 reps   Bent Knee Raise 10 reps   Knee/Hip Exercises: Standing   Heel Raises 5 reps   Functional Squat 15 reps  from a high mat   Other Standing Knee Exercises glute set   Other Standing Knee Exercises standing hip abduction    Knee/Hip Exercises: Supine   Short Arc Quad Sets Strengthening;Both;3 sets;5 reps  3#   Shoulder Exercises: Supine   Other Supine Exercises bicep curls woth 1# weight x 20 reps, 2# was too heavy   Other Supine Exercises active assisted shoulder flexion manually and with dowel rod   Shoulder Exercises: ROM/Strengthening   Other ROM/Strengthening Exercises seated push on knees with thoracic externsion   Modalities   Modalities Moist Heat   Moist Heat Therapy   Number Minutes Moist Heat 25 Minutes   Moist Heat Location --  15 minutes to right shoulder, 10 minutes to  right knee   Manual Therapy   Manual Therapy Passive ROM   Passive ROM to both shoulders    Ankle Exercises: Supine   Other Supine Ankle Exercises ankle pumps x 20                PT Education - 06/07/14 1232    Education provided Yes   Education Details exercise   Person(s) Educated Patient   Methods Explanation;Demonstration;Verbal cues   Comprehension Verbalized understanding;Returned demonstration           [facility] Goals - 06/07/14 1235    CC Short Term Goal  #1   Title pt will report a decrease in pain by 25%   Time 4   Period Weeks   Status On-going   CC Short Term Goal  #2   Title pt will be able to perfomr 5 repetition of sit to stand in 30 seconds   Time 4   Period Weeks   Status On-going   CC Short Term Goal  #3   Title pt will have a decrase in Quick Dash Scoure by 5 points   Period Weeks   Status On-going   CC Short Term Goal  #4   Title pt will be able to perform a basic exercise program at home   Time 4   Status On-going             [facility] Goals - 06/07/14 1237    CC Long Term Goal  #1   Title pt will report decrease in pain by 50%   Time 8   Period Weeks   Status On-going   CC Long Term Goal  #2   Title pt will report increase in her ability to care for herself by 50%   Time 8   Status On-going   CC Long Term Goal  #3   Title pt will be able to stand for 3 minutes inable to prepare a meal for herself at home   Time 8   Period Weeks   Status On-going   CC Long Term Goal  #4   Title pt will be independent in an advanced home program.(includes stretch, strenghen and endurance component)   Time 8   Status On-going   CC Long Term Goal  #5   Title pt will have 125 degrees of right and left shoulder flexion for increased activities of daily living   Time 8   Period Weeks   Status On-going  Plan - 06/07/14 1233    Clinical Impression Statement Pt received symptomatic relief from hot pack and  was better able to participate with exercise.  She needs continued range of motion and strengthening to maintain functional independence   PT Next Visit Plan Core activation and strengthening, LE strenghening, possible use of moist heat to back and/right hip/ bed mobility. UE range of motion within pain limits        Problem List Patient Active Problem List   Diagnosis Date Noted  . Breast cancer of upper-outer quadrant of left female breast 01/24/2013  . Edema 01/09/2013  . Onychomycosis 01/09/2013  . Pain in joint, ankle and foot 01/09/2013   Donato Heinz. Owens Shark, PT   06/07/2014, 12:39 PM  [address] [place], [place], [address] Phone: [phone]   Fax:  [phone]

## 2014-06-07 NOTE — Patient Instructions (Signed)
Pt instructed to do a set of 10 partial squats from the high chair she has at home 3 times a day

## 2014-06-11 ENCOUNTER — Ambulatory Visit: Payer: Medicare Other | Admitting: Physical Therapy

## 2014-06-11 DIAGNOSIS — M25511 Pain in right shoulder: Secondary | ICD-10-CM | POA: Diagnosis not present

## 2014-06-11 DIAGNOSIS — M25411 Effusion, right shoulder: Secondary | ICD-10-CM

## 2014-06-11 DIAGNOSIS — M6281 Muscle weakness (generalized): Secondary | ICD-10-CM

## 2014-06-11 DIAGNOSIS — M24511 Contracture, right shoulder: Secondary | ICD-10-CM

## 2014-06-11 DIAGNOSIS — R296 Repeated falls: Secondary | ICD-10-CM

## 2014-06-11 NOTE — Therapy (Signed)
[place], [place], [address] Phone: [phone]   Fax:  [phone]  Physical Therapy Treatment  Patient Details  Name: [name] MRN: [id number] Date of Birth: 03/25/1941 Referring Provider:  Rolm Bookbinder, MD  Encounter Date: 06/11/2014      PT End of Session - 06/11/14 1728    Visit Number 3   Number of Visits 17   Date for PT Re-Evaluation 08/02/14   PT Start Time 5188   PT Stop Time 1600   PT Time Calculation (min) 45 min      Past Medical History  Diagnosis Date  . Hyperlipidemia   . Hypertension   . Cold     just started 01/02/13 cough  . Complication of anesthesia     very phobic about needles.last surgery had to sedate before doing iv  . Exertional shortness of breath   . Uterine cancer   . Chronic lower back pain   . Depression   . Arthritis     "joints; mostly on the right side" (01/11/2013)  . Breast cancer     "left" (01/11/2013)  . Cancer     endometrial  . Allergy   . Osteoporosis     knees   . Status post radiation therapy within last four weeks 03/19/13-05/03/13    lt breast 60.4GY    Past Surgical History  Procedure Laterality Date  . Replacement total knee Bilateral 2011-2012  . Foot tendon surgery Right   . Breast lumpectomy with needle localization and axillary sentinel lymph node bx Left 01/11/2013  . Breast biopsy Left 12/2012  . Abdominal hysterectomy  12/14/2012  . Dilation and curettage of uterus      "@ least one" (01/11/2013)  . Tubal ligation    . Cholecystectomy  1970  . Knee arthroscopy Right 1990's    "twice" (01/11/2013)  . Breast lumpectomy with needle localization and axillary sentinel lymph node bx Left 01/11/2013    Procedure: BREAST LUMPECTOMY WITH NEEDLE LOCALIZATION AND AXILLARY SENTINEL LYMPH NODE BX;  Surgeon: Rolm Bookbinder, MD;  Location: [facility];  Service: General;  Laterality: Left;    There were no vitals taken for this  visit.  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 pain is stil in the same places but not as bad  pt reports she is being extremely careful at home   Pain Score 4    Pain Location Generalized                    [facility] Adult PT Treatment/Exercise - 06/11/14 1533    Bed Mobility   Bed Mobility Supine to Sit;Sit to Supine   Supine to Sit 4: Min assist   Sit to Supine 4: Min assist   Transfers   Transfers Sit to Stand;Stand to Sit   Sit to Stand 6: Modified independent (Device/Increase time)  extra time easier to stand from higher surface   Lumbar Exercises: Stretches   Single Knee to Chest Stretch 5 reps   Lower Trunk Rotation 3 reps   Pelvic Tilt 5 reps   Lumbar Exercises: Supine   Ab Set 10 reps   Bent Knee Raise 10 reps  2 #   Knee/Hip Exercises: Standing   Heel Raises --  Functional Squat --  from a high mat   Other Standing Knee Exercises --   Other Standing Knee Exercises --   Knee/Hip Exercises: Supine   Short Arc Quad Sets Strengthening;Both;3 sets;5 reps;10 reps  4#   Shoulder Exercises: Supine   Other Supine Exercises bicep curls , 2# on dowel rod   Other Supine Exercises active assisted shoulder flexion manually and with dowel rod   Shoulder Exercises: ROM/Strengthening   Other ROM/Strengthening Exercises --   Modalities   Modalities Moist Heat   Moist Heat Therapy   Moist Heat Location --  15 minutes to right shoulder, 10 minutes to right knee   Manual Therapy   Manual Therapy Passive ROM   Passive ROM to both shoulders    Ankle Exercises: Supine   Other Supine Ankle Exercises ankle pumps x 20                   Short Term Clinic Goals - 06/07/14 1235    CC Short Term Goal  #1   Title pt will report a decrease in pain by 25%   Time 4   Period  Weeks   Status On-going   CC Short Term Goal  #2   Title pt will be able to perfomr 5 repetition of sit to stand in 30 seconds   Time 4   Period Weeks   Status On-going   CC Short Term Goal  #3   Title pt will have a decrase in Quick Dash Scoure by 5 points   Period Weeks   Status On-going   CC Short Term Goal  #4   Title pt will be able to perform a basic exercise program at home   Time 4   Status On-going             [facility] Goals - 06/07/14 1237    CC Long Term Goal  #1   Title pt will report decrease in pain by 50%   Time 8   Period Weeks   Status On-going   CC Long Term Goal  #2   Title pt will report increase in her ability to care for herself by 50%   Time 8   Status On-going   CC Long Term Goal  #3   Title pt will be able to stand for 3 minutes inable to prepare a meal for herself at home   Time 8   Period Weeks   Status On-going   CC Long Term Goal  #4   Title pt will be independent in an advanced home program.(includes stretch, strenghen and endurance component)   Time 8   Status On-going   CC Long Term Goal  #5   Title pt will have 125 degrees of right and left shoulder flexion for increased activities of daily living   Time 8   Period Weeks   Status On-going            Plan - 06/11/14 1729    Clinical Impression Statement Pt reports some improvement.  She still has lots of crepitus in shoulders with midrange Acitve and Passive range of motion.  She felt much better after treatement and was  happy she could lift her arm higher so she could put her coat on better.   PT Next Visit Plan Core activation and strengthening, LE strenghening, possible use of moist heat to back and/right hip/ bed mobility. UE range of motion within pain limits  Problem List Patient Active Problem List   Diagnosis Date Noted  . Breast cancer of upper-outer quadrant of left female breast 01/24/2013  . Edema 01/09/2013  . Onychomycosis 01/09/2013  .  Pain in joint, ankle and foot 01/09/2013   Donato Heinz. Owens Shark, PT  06/11/2014, 5:32 PM  [place], [place], [address] Phone: [phone]   Fax:  [phone]

## 2014-06-13 ENCOUNTER — Ambulatory Visit: Payer: Medicare Other | Admitting: Physical Therapy

## 2014-06-13 DIAGNOSIS — M25411 Effusion, right shoulder: Secondary | ICD-10-CM

## 2014-06-13 DIAGNOSIS — R296 Repeated falls: Secondary | ICD-10-CM

## 2014-06-13 DIAGNOSIS — M6281 Muscle weakness (generalized): Secondary | ICD-10-CM

## 2014-06-13 DIAGNOSIS — M25511 Pain in right shoulder: Secondary | ICD-10-CM

## 2014-06-13 DIAGNOSIS — M24511 Contracture, right shoulder: Secondary | ICD-10-CM

## 2014-06-13 NOTE — Therapy (Signed)
[place], [place], [address] Phone: [phone]   Fax:  [phone]  Physical Therapy Treatment  Patient Details  Name: [name] MRN: [id number] Date of Birth: [birth year] Referring Provider:  Willey Blade, MD  Encounter Date: 06/13/2014      PT End of Session - 06/13/14 1354    Visit Number 4   Number of Visits 17   Date for PT Re-Evaluation 08/02/14   PT Start Time 1194   PT Stop Time 1345   PT Time Calculation (min) 47 min      Past Medical History  Diagnosis Date  . Hyperlipidemia   . Hypertension   . Cold     just started 01/02/13 cough  . Complication of anesthesia     very phobic about needles.last surgery had to sedate before doing iv  . Exertional shortness of breath   . Uterine cancer   . Chronic lower back pain   . Depression   . Arthritis     "joints; mostly on the right side" (01/11/2013)  . Breast cancer     "left" (01/11/2013)  . Cancer     endometrial  . Allergy   . Osteoporosis     knees   . Status post radiation therapy within last four weeks 03/19/13-05/03/13    lt breast 60.4GY    Past Surgical History  Procedure Laterality Date  . Replacement total knee Bilateral 2011-2012  . Foot tendon surgery Right   . Breast lumpectomy with needle localization and axillary sentinel lymph node bx Left 01/11/2013  . Breast biopsy Left 12/2012  . Abdominal hysterectomy  12/14/2012  . Dilation and curettage of uterus      "@ least one" (01/11/2013)  . Tubal ligation    . Cholecystectomy  1970  . Knee arthroscopy Right 1990's    "twice" (01/11/2013)  . Breast lumpectomy with needle localization and axillary sentinel lymph node bx Left 01/11/2013    Procedure: BREAST LUMPECTOMY WITH NEEDLE LOCALIZATION AND AXILLARY SENTINEL LYMPH NODE BX;  Surgeon: Rolm Bookbinder, MD;  Location: [facility];  Service: General;  Laterality: Left;    There were no vitals taken for this  visit.  Visit Diagnosis:  No diagnosis found.      Subjective Assessment - 06/13/14 1305    Symptoms pt feels better overall and feels like she is sleeping better.  She woke up with "popcorn" in left shoulder this morning   Pain Score 3    Pain Location --  left shoulder and right knee.                    [facility] Adult PT Treatment/Exercise - 06/13/14 1307    Bed Mobility   Bed Mobility Supine to Sit;Sit to Supine   Supine to Sit 4: Min assist   Sit to Supine 4: Min assist   Transfers   Transfers Sit to Stand;Stand to Sit   Sit to Stand 6: Modified independent (Device/Increase time)  extra time easier to stand from higher surface   Lumbar Exercises: Stretches   Single Knee to Chest Stretch 5 reps   Lower Trunk Rotation 3 reps   Pelvic Tilt 5 reps   Lumbar Exercises: Seated   Sit to Stand 15 reps  for high surface with concentation on eccentric conrtrol    Sit to Stand Limitations difficulty from low back    Other Seated Lumbar Exercises bilateral chop and lift    Other  Seated Lumbar Exercises heel and toe raise   Lumbar Exercises: Supine   Ab Set 10 reps   Bent Knee Raise 10 reps  2 #   Knee/Hip Exercises: Standing   Functional Squat --  from a high mat   Knee/Hip Exercises: Supine   Short Arc Quad Sets Strengthening;Both;3 sets;5 reps;10 reps  4#   Shoulder Exercises: Supine   Other Supine Exercises bicep curls , 2#    Other Supine Exercises active assisted shoulder flexion manually    Shoulder Exercises: ROM/Strengthening   Other ROM/Strengthening Exercises elbow extension in supine with assist to hold upper arm    Modalities   Modalities Moist Heat   Moist Heat Therapy   Number Minutes Moist Heat 25 Minutes   Moist Heat Location --  15 minutes to right shoulder, 10 minutes to right knee   Manual Therapy   Manual Therapy Passive ROM   Passive ROM to both shoulders and stretch to both hips knee to chest for low back stretch   Ankle Exercises: Supine    Other Supine Ankle Exercises ankle pumps x 20                   Short Term Clinic Goals - 06/07/14 1235    CC Short Term Goal  #1   Title pt will report a decrease in pain by 25%   Time 4   Period Weeks   Status On-going   CC Short Term Goal  #2   Title pt will be able to perfomr 5 repetition of sit to stand in 30 seconds   Time 4   Period Weeks   Status On-going   CC Short Term Goal  #3   Title pt will have a decrase in Quick Dash Scoure by 5 points   Period Weeks   Status On-going   CC Short Term Goal  #4   Title pt will be able to perform a basic exercise program at home   Time 4   Status On-going             [facility] Goals - 06/07/14 1237    CC Long Term Goal  #1   Title pt will report decrease in pain by 50%   Time 8   Period Weeks   Status On-going   CC Long Term Goal  #2   Title pt will report increase in her ability to care for herself by 50%   Time 8   Status On-going   CC Long Term Goal  #3   Title pt will be able to stand for 3 minutes inable to prepare a meal for herself at home   Time 8   Period Weeks   Status On-going   CC Long Term Goal  #4   Title pt will be independent in an advanced home program.(includes stretch, strenghen and endurance component)   Time 8   Status On-going   CC Long Term Goal  #5   Title pt will have 125 degrees of right and left shoulder flexion for increased activities of daily living   Time 8   Period Weeks   Status On-going            Plan - 06/13/14 1355    Clinical Impression Statement gradual improvement contines, though pt still limited by pain in shoulders knee and low back. Good effort with exercise with obvious abdominal contractions today.    PT Next Visit Plan Core activation and  strengthening, LE strenghening, possible use of moist heat to back and/right hip/ bed mobility. UE range of motion within pain limits        Problem List Patient Active Problem List   Diagnosis Date  Noted  . Breast cancer of upper-outer quadrant of left female breast 01/24/2013  . Edema 01/09/2013  . Onychomycosis 01/09/2013  . Pain in joint, ankle and foot 01/09/2013   Donato Heinz. Owens Shark, PT   06/13/2014, 1:57 PM  [place], [place], [address] Phone: [phone]   Fax:  [phone]

## 2014-06-19 ENCOUNTER — Ambulatory Visit: Payer: Medicare Other | Admitting: Physical Therapy

## 2014-06-25 ENCOUNTER — Ambulatory Visit: Payer: BLUE CROSS/BLUE SHIELD | Attending: General Surgery | Admitting: Physical Therapy

## 2014-06-25 DIAGNOSIS — M6281 Muscle weakness (generalized): Secondary | ICD-10-CM

## 2014-06-25 DIAGNOSIS — M25511 Pain in right shoulder: Secondary | ICD-10-CM | POA: Diagnosis not present

## 2014-06-25 DIAGNOSIS — M25411 Effusion, right shoulder: Secondary | ICD-10-CM | POA: Diagnosis not present

## 2014-06-25 DIAGNOSIS — R296 Repeated falls: Secondary | ICD-10-CM | POA: Insufficient documentation

## 2014-06-25 DIAGNOSIS — M24511 Contracture, right shoulder: Secondary | ICD-10-CM

## 2014-06-26 NOTE — Therapy (Signed)
[place], [place], [address] Phone: [phone]   Fax:  [phone]  Physical Therapy Treatment  Patient Details  Name: [name] MRN: [id number] Date of Birth: Jan 19, 1941 Referring Provider:  Willey Blade, MD  Encounter Date: 06/25/2014      PT End of Session - 06/26/14 0736    Visit Number 5   Number of Visits 17   Date for PT Re-Evaluation 08/02/14   PT Start Time 6073   PT Stop Time 1515   PT Time Calculation (min) 42 min      Past Medical History  Diagnosis Date  . Hyperlipidemia   . Hypertension   . Cold     just started 01/02/13 cough  . Complication of anesthesia     very phobic about needles.last surgery had to sedate before doing iv  . Exertional shortness of breath   . Uterine cancer   . Chronic lower back pain   . Depression   . Arthritis     "joints; mostly on the right side" (01/11/2013)  . Breast cancer     "left" (01/11/2013)  . Cancer     endometrial  . Allergy   . Osteoporosis     knees   . Status post radiation therapy within last four weeks 03/19/13-05/03/13    lt breast 60.4GY    Past Surgical History  Procedure Laterality Date  . Replacement total knee Bilateral 2011-2012  . Foot tendon surgery Right   . Breast lumpectomy with needle localization and axillary sentinel lymph node bx Left 01/11/2013  . Breast biopsy Left 12/2012  . Abdominal hysterectomy  12/14/2012  . Dilation and curettage of uterus      "@ least one" (01/11/2013)  . Tubal ligation    . Cholecystectomy  1970  . Knee arthroscopy Right 1990's    "twice" (01/11/2013)  . Breast lumpectomy with needle localization and axillary sentinel lymph node bx Left 01/11/2013    Procedure: BREAST LUMPECTOMY WITH NEEDLE LOCALIZATION AND AXILLARY SENTINEL LYMPH NODE BX;  Surgeon: Rolm Bookbinder, MD;  Location: [facility];  Service: General;  Laterality: Left;    There were no vitals taken for this  visit.  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 She is do for a mammogram next week   Currently in Pain? Yes   Pain Score 6    Pain Location --  across both shoulders and right knee and down left arm.                    [facility] Adult PT Treatment/Exercise - 06/26/14 0001    Moist Heat Therapy   Number Minutes Moist Heat 20 Minutes   Moist Heat Location Shoulder  left   Manual Therapy   Manual Therapy Passive ROM   Passive ROM to both shoulders                    Short Term Clinic Goals - 06/07/14 1235    CC Short Term Goal  #1   Title pt will report a decrease in pain by 25%   Time 4   Period  Weeks   Status On-going   CC Short Term Goal  #2   Title pt will be able to perfomr 5 repetition of sit to stand in 30 seconds   Time 4   Period Weeks   Status On-going   CC Short Term Goal  #3   Title pt will have a decrase in Quick Dash Scoure by 5 points   Period Weeks   Status On-going   CC Short Term Goal  #4   Title pt will be able to perform a basic exercise program at home   Time 4   Status On-going             [facility] Goals - 06/07/14 1237    CC Long Term Goal  #1   Title pt will report decrease in pain by 50%   Time 8   Period Weeks   Status On-going   CC Long Term Goal  #2   Title pt will report increase in her ability to care for herself by 50%   Time 8   Status On-going   CC Long Term Goal  #3   Title pt will be able to stand for 3 minutes inable to prepare a meal for herself at home   Time 8   Period Weeks   Status On-going   CC Long Term Goal  #4   Title pt will be independent in an advanced home program.(includes stretch,  strenghen and endurance component)   Time 8   Status On-going   CC Long Term Goal  #5   Title pt will have 125 degrees of right and left shoulder flexion for increased activities of daily living   Time 8   Period Weeks   Status On-going            Plan - 06/26/14 0736    Clinical Impression Statement pt continues to work hard and appears to have better contractiions at core and LEs Continues to have shoulder pain and crepitus, but is resonding to treatment   PT Next Visit Plan Core activation and strengthening, LE strenghening, possible use of moist heat to back and/right hip/ bed mobility. UE range of motion within pain limits        Problem List Patient Active Problem List   Diagnosis Date Noted  . Breast cancer of upper-outer quadrant of left female breast 01/24/2013  . Edema 01/09/2013  . Onychomycosis 01/09/2013  . Pain in joint, ankle and foot 01/09/2013   Donato Heinz. Owens Shark, PT    06/26/2014, 7:38 AM  [place], [place], [address] Phone: [phone]   Fax:  [phone]

## 2014-06-27 ENCOUNTER — Ambulatory Visit: Payer: BLUE CROSS/BLUE SHIELD | Admitting: Physical Therapy

## 2014-06-27 DIAGNOSIS — M6281 Muscle weakness (generalized): Secondary | ICD-10-CM

## 2014-06-27 DIAGNOSIS — M25411 Effusion, right shoulder: Secondary | ICD-10-CM

## 2014-06-27 DIAGNOSIS — M25511 Pain in right shoulder: Secondary | ICD-10-CM | POA: Diagnosis not present

## 2014-06-27 DIAGNOSIS — R296 Repeated falls: Secondary | ICD-10-CM

## 2014-06-27 DIAGNOSIS — M24511 Contracture, right shoulder: Secondary | ICD-10-CM

## 2014-06-27 NOTE — Therapy (Signed)
[place], [place], [address] Phone: [phone]   Fax:  [phone]  Physical Therapy Treatment  Patient Details  Name: [name] MRN: [id number] Date of Birth: [birth year] Referring Provider:  Willey Blade, MD  Encounter Date: 06/27/2014      PT End of Session - 06/27/14 1734    Visit Number 6   Number of Visits 17   Date for PT Re-Evaluation 08/02/14   PT Start Time 9211   PT Stop Time 1600   PT Time Calculation (min) 45 min      Past Medical History  Diagnosis Date  . Hyperlipidemia   . Hypertension   . Cold     just started 01/02/13 cough  . Complication of anesthesia     very phobic about needles.last surgery had to sedate before doing iv  . Exertional shortness of breath   . Uterine cancer   . Chronic lower back pain   . Depression   . Arthritis     "joints; mostly on the right side" (01/11/2013)  . Breast cancer     "left" (01/11/2013)  . Cancer     endometrial  . Allergy   . Osteoporosis     knees   . Status post radiation therapy within last four weeks 03/19/13-05/03/13    lt breast 60.4GY    Past Surgical History  Procedure Laterality Date  . Replacement total knee Bilateral 2011-2012  . Foot tendon surgery Right   . Breast lumpectomy with needle localization and axillary sentinel lymph node bx Left 01/11/2013  . Breast biopsy Left 12/2012  . Abdominal hysterectomy  12/14/2012  . Dilation and curettage of uterus      "@ least one" (01/11/2013)  . Tubal ligation    . Cholecystectomy  1970  . Knee arthroscopy Right 1990's    "twice" (01/11/2013)  . Breast lumpectomy with needle localization and axillary sentinel lymph node bx Left 01/11/2013    Procedure: BREAST LUMPECTOMY WITH NEEDLE LOCALIZATION AND AXILLARY SENTINEL LYMPH NODE BX;  Surgeon: Rolm Bookbinder, MD;  Location: [facility];  Service: General;  Laterality: Left;    There were no vitals taken for this  visit.  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 and developed increased pain in her legs                    [facility] Adult PT Treatment/Exercise - 06/27/14 1519    Bed Mobility   Bed Mobility Supine to Sit;Sit to Supine   Supine to Sit 4: Min assist   Sit to Supine 4: Min assist   Transfers   Transfers Sit to Stand;Stand to Sit   Sit to Stand 6: Modified independent (Device/Increase time)  extra time easier to stand from higher surface   Posture/Postural Control   Postural Limitations Forward head;Rounded Shoulders;Increased lumbar lordosis;Flexed trunk;Anterior pelvic tilt   Posture Comments attempted to work on pelvic tilts against the wall, but pt unable today  Lumbar Exercises: Stretches   Single Knee to Chest Stretch 5 reps  worked on hip flexor stretch with leg extended   Lower Trunk Rotation 3 reps   Pelvic Tilt 5 reps   Lumbar Exercises: Standing   Other Standing Lumbar Exercises wall taps x 15 reps   Lumbar Exercises: Seated   Sit to Stand --   Sit to Stand Limitations --   Other Seated Lumbar Exercises bilateral hip to hip below sholder height   Other Seated Lumbar Exercises --   Lumbar Exercises: Supine   Ab Set 10 reps   Bent Knee Raise 10 reps  2 #   Knee/Hip Exercises: Standing   Functional Squat --   Knee/Hip Exercises: Seated   Other Seated Knee Exercises green theraband hamstring exercise   Other Seated Knee Exercises marching in sitting with taps onto 4 inch step   Knee/Hip Exercises: Supine   Short Arc Quad Sets --   Shoulder Exercises: Supine    Other Supine Exercises --   Other Supine Exercises --   Shoulder Exercises: Seated   Other Seated Exercises bicep curl    Shoulder Exercises: Pulleys   Flexion 2 minutes   ABduction 2 minutes   Shoulder Exercises: ROM/Strengthening   Other ROM/Strengthening Exercises sittin biceps felxion with 3#   Other ROM/Strengthening Exercises pressing down on thighs to activate extensors   Modalities   Modalities --   Moist Heat Therapy   Moist Heat Location --  15 minutes to right shoulder, 10 minutes to right knee   Manual Therapy   Manual Therapy Passive ROM   Passive ROM to both shoulders   Ankle Exercises: Supine   Other Supine Ankle Exercises ankle pumps x 20   Ankle Exercises: Seated   Heel Raises 20 reps   Toe Raise 20 reps                   [facility] Goals - 06/07/14 1235    CC Short Term Goal  #1   Title pt will report a decrease in pain by 25%   Time 4   Period Weeks   Status On-going   CC Short Term Goal  #2   Title pt will be able to perfomr 5 repetition of sit to stand in 30 seconds   Time 4   Period Weeks   Status On-going   CC Short Term Goal  #3   Title pt will have a decrase in Quick Dash Scoure by 5 points   Period Weeks   Status On-going   CC Short Term Goal  #4   Title pt will be able to perform a basic exercise program at home   Time 4   Status On-going             [facility] Goals - 06/07/14 1237    CC Long Term Goal  #1   Title pt will report decrease in pain by 50%   Time 8   Period Weeks   Status On-going   CC Long Term Goal  #2   Title pt will report increase in her ability to care for herself by 50%   Time 8   Status On-going   CC Long Term Goal  #3   Title pt will be able to stand for 3 minutes inable to prepare a meal for herself at home   Time 8   Period Weeks   Status On-going   CC Long Term Goal  #  4   Title pt will be independent in an advanced home program.(includes stretch, strenghen and endurance  component)   Time 8   Status On-going   CC Long Term Goal  #5   Title pt will have 125 degrees of right and left shoulder flexion for increased activities of daily living   Time 8   Period Weeks   Status On-going            Plan - 06/27/14 1735    Clinical Impression Statement pt did well with different exercise positions today.  She continues to be limited by hip flexor tightness, shoulder pain, weakness and decreased range of motion, but is making steady gains overall.   PT Next Visit Plan Core activation and strengthening, LE strenghening, possible use of moist heat to back and/right hip/ bed mobility. UE range of motion within pain limits        Problem List Patient Active Problem List   Diagnosis Date Noted  . Breast cancer of upper-outer quadrant of left female breast 01/24/2013  . Edema 01/09/2013  . Onychomycosis 01/09/2013  . Pain in joint, ankle and foot 01/09/2013   Donato Heinz. Owens Shark, PT   06/27/2014, 5:38 PM  [place], [place], [address] Phone: [phone]   Fax:  [phone]

## 2014-07-02 ENCOUNTER — Ambulatory Visit
Admission: RE | Admit: 2014-07-02 | Discharge: 2014-07-02 | Disposition: A | Discharge status: Home / Self Care | Payer: BLUE CROSS/BLUE SHIELD | Source: Ambulatory Visit | Attending: Oncology | Admitting: Oncology

## 2014-07-02 DIAGNOSIS — C50919 Malignant neoplasm of unspecified site of unspecified female breast: Secondary | ICD-10-CM

## 2014-07-03 ENCOUNTER — Ambulatory Visit: Payer: BLUE CROSS/BLUE SHIELD | Admitting: Physical Therapy

## 2014-07-03 DIAGNOSIS — M25511 Pain in right shoulder: Secondary | ICD-10-CM

## 2014-07-03 DIAGNOSIS — M25411 Effusion, right shoulder: Secondary | ICD-10-CM

## 2014-07-03 DIAGNOSIS — M24511 Contracture, right shoulder: Secondary | ICD-10-CM

## 2014-07-03 DIAGNOSIS — M6281 Muscle weakness (generalized): Secondary | ICD-10-CM

## 2014-07-03 DIAGNOSIS — R296 Repeated falls: Secondary | ICD-10-CM

## 2014-07-03 NOTE — Therapy (Signed)
[place], [place], [address] Phone: [phone]   Fax:  [phone]  Physical Therapy Treatment  Patient Details  Name: IRVIN LIZAMA MRN: [id number] Date of Birth: [birth year] Referring Provider:  Willey Blade, MD  Encounter Date: 07/03/2014      PT End of Session - 07/03/14 1529    Visit Number 7   Number of Visits 17   Date for PT Re-Evaluation 08/02/14   PT Start Time 1440   PT Stop Time 1515   PT Time Calculation (min) 35 min   Activity Tolerance Patient tolerated treatment well   Behavior During Therapy [facility] for tasks assessed/performed      Past Medical History  Diagnosis Date  . Hyperlipidemia   . Hypertension   . Cold     just started 01/02/13 cough  . Complication of anesthesia     very phobic about needles.last surgery had to sedate before doing iv  . Exertional shortness of breath   . Uterine cancer   . Chronic lower back pain   . Depression   . Arthritis     "joints; mostly on the right side" (01/11/2013)  . Breast cancer     "left" (01/11/2013)  . Cancer     endometrial  . Allergy   . Osteoporosis     knees   . Status post radiation therapy within last four weeks 03/19/13-05/03/13    lt breast 60.4GY    Past Surgical History  Procedure Laterality Date  . Replacement total knee Bilateral 2011-2012  . Foot tendon surgery Right   . Breast lumpectomy with needle localization and axillary sentinel lymph node bx Left 01/11/2013  . Breast biopsy Left 12/2012  . Abdominal hysterectomy  12/14/2012  . Dilation and curettage of uterus      "@ least one" (01/11/2013)  . Tubal ligation    . Cholecystectomy  1970  . Knee arthroscopy Right 1990's    "twice" (01/11/2013)  . Breast lumpectomy with needle localization and axillary sentinel lymph node bx Left 01/11/2013    Procedure: BREAST LUMPECTOMY WITH NEEDLE LOCALIZATION AND AXILLARY SENTINEL LYMPH NODE BX;  Surgeon: Rolm Bookbinder, MD;  Location: [facility];  Service: General;  Laterality: Left;    There were no vitals taken for this visit.  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  Pain and swelling of right shoulder      Subjective Assessment - 07/03/14 1445    Symptoms "pretty good'             [facility] Adult PT Treatment/Exercise - 07/03/14 1451    Transfers   Transfers Sit to Stand;Stand to Sit   Sit to Stand 4: Min guard   Posture/Postural Control   Posture Comments back against the wall with trunk extension   Lumbar Exercises: Standing   Other Standing Lumbar Exercises staggered stance weight shift with dorsi and plantar flexion   Other Standing Lumbar Exercises glute sets   Knee/Hip Exercises: Standing   Heel Raises 5 reps  pt had much difficult with this   Other Standing Knee Exercises standing hip abduction 10 reps on each leg   Other Standing Knee Exercises knee raises 2 sets of 5 reps on each leg   Knee/Hip Exercises: Seated   Other Seated Knee Exercises green theraband hamstring 10 reps on each side   Other Seated Knee Exercises marching in sitting with taps onto 6  iinch step   Shoulder Exercises: Seated   Other Seated Exercises bicep curl with 3 # x 20 reps    Other Seated Exercises green theraband elbow extension and rows.   Shoulder Exercises: Pulleys   Flexion 2 minutes   ABduction 2 minutes   Shoulder Exercises: ROM/Strengthening   Proximal Shoulder Strengthening, Seated pushing down on large therapy ball for closed chain work           [facility] Goals - 06/07/14 1235    CC Short Term Goal  #1   Title pt will report a decrease in pain by 25%   Time 4   Period Weeks   Status On-going   CC Short Term Goal  #2   Title pt will be able to perfomr 5 repetition of sit to stand in 30 seconds   Time 4   Period Weeks   Status On-going   CC Short Term Goal  #3   Title pt will have a decrase in  Quick Dash Scoure by 5 points   Period Weeks   Status On-going   CC Short Term Goal  #4   Title pt will be able to perform a basic exercise program at home   Time 4   Status On-going             [facility] Goals - 06/07/14 1237    CC Long Term Goal  #1   Title pt will report decrease in pain by 50%   Time 8   Period Weeks   Status On-going   CC Long Term Goal  #2   Title pt will report increase in her ability to care for herself by 50%   Time 8   Status On-going   CC Long Term Goal  #3   Title pt will be able to stand for 3 minutes inable to prepare a meal for herself at home   Time 8   Period Weeks   Status On-going   CC Long Term Goal  #4   Title pt will be independent in an advanced home program.(includes stretch, strenghen and endurance component)   Time 8   Status On-going   CC Long Term Goal  #5   Title pt will have 125 degrees of right and left shoulder flexion for increased activities of daily living   Time 8   Period Weeks   Status On-going            Plan - 07/03/14 1530    Clinical Impression Statement continues to work hard during PT sessions, but requires frequent rest breaks for standing activities.  she does not feel she is strong enough to advance to exercise equipment yet.    PT Next Visit Plan Work in supine for UE, core and LE strengthening. reassess short term goal progress        Problem List Patient Active Problem List   Diagnosis Date Noted  . Breast cancer of upper-outer quadrant of left female breast 01/24/2013  . Edema 01/09/2013  . Onychomycosis 01/09/2013  . Pain in joint, ankle and foot 01/09/2013   Donato Heinz. Owens Shark, PT   07/03/2014, 3:35 PM  [place], [place], [address] Phone: [phone]   Fax:  [phone]

## 2014-07-09 ENCOUNTER — Ambulatory Visit: Payer: BLUE CROSS/BLUE SHIELD | Admitting: Physical Therapy

## 2014-07-11 ENCOUNTER — Ambulatory Visit: Payer: BLUE CROSS/BLUE SHIELD | Admitting: Physical Therapy

## 2014-07-16 ENCOUNTER — Ambulatory Visit: Payer: BLUE CROSS/BLUE SHIELD | Admitting: Physical Therapy

## 2014-07-16 DIAGNOSIS — M6281 Muscle weakness (generalized): Secondary | ICD-10-CM

## 2014-07-16 DIAGNOSIS — R296 Repeated falls: Secondary | ICD-10-CM

## 2014-07-16 DIAGNOSIS — M25511 Pain in right shoulder: Secondary | ICD-10-CM | POA: Diagnosis not present

## 2014-07-16 DIAGNOSIS — M24511 Contracture, right shoulder: Secondary | ICD-10-CM

## 2014-07-16 DIAGNOSIS — M25411 Effusion, right shoulder: Secondary | ICD-10-CM

## 2014-07-16 NOTE — Therapy (Signed)
[place], [place], [address] Phone: [phone]   Fax:  [phone]  Physical Therapy Treatment  Patient Details  Name: [name] MRN: [id number] Date of Birth: 01/06/1941 Referring Provider:  Willey Blade, MD  Encounter Date: 07/16/2014      PT End of Session - 07/16/14 1736    Visit Number 9   Number of Visits 17   Date for PT Re-Evaluation 08/02/14   PT Start Time 1950   PT Stop Time 1600   PT Time Calculation (min) 45 min   Behavior During Therapy [facility] for tasks assessed/performed      Past Medical History  Diagnosis Date  . Hyperlipidemia   . Hypertension   . Cold     just started 01/02/13 cough  . Complication of anesthesia     very phobic about needles.last surgery had to sedate before doing iv  . Exertional shortness of breath   . Uterine cancer   . Chronic lower back pain   . Depression   . Arthritis     "joints; mostly on the right side" (01/11/2013)  . Breast cancer     "left" (01/11/2013)  . Cancer     endometrial  . Allergy   . Osteoporosis     knees   . Status post radiation therapy within last four weeks 03/19/13-05/03/13    lt breast 60.4GY    Past Surgical History  Procedure Laterality Date  . Replacement total knee Bilateral 2011-2012  . Foot tendon surgery Right   . Breast lumpectomy with needle localization and axillary sentinel lymph node bx Left 01/11/2013  . Breast biopsy Left 12/2012  . Abdominal hysterectomy  12/14/2012  . Dilation and curettage of uterus      "@ least one" (01/11/2013)  . Tubal ligation    . Cholecystectomy  1970  . Knee arthroscopy Right 1990's    "twice" (01/11/2013)  . Breast lumpectomy with needle localization and axillary sentinel lymph node bx Left 01/11/2013    Procedure: BREAST LUMPECTOMY WITH NEEDLE LOCALIZATION AND AXILLARY SENTINEL LYMPH NODE BX;  Surgeon: Rolm Bookbinder, MD;  Location: [facility];  Service: General;   Laterality: Left;    There were no vitals taken for this visit.  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  She also reports she was doing housework and pulled her back and left side    Currently in Pain? Yes   Pain Score 7    Pain Location --  right shoulder, left shoulder, left arm and left calf   Pain Orientation Left   Pain Descriptors / Indicators Nagging;Aching   Pain Type Chronic pain                    [facility] Adult PT Treatment/Exercise - 07/16/14 0001    Elbow Exercises   Elbow Flexion Strengthening;15 reps;Both  4#   Elbow Extension Strengthening;10 reps  4#   Lumbar Exercises: Stretches   Pelvic Tilt 5 reps  sitting   Standing Side Bend Limitations sitting side bend   Standing Extension 5 reps  sitting lumbar extension   Shoulder Exercises: Seated   Flexion AAROM;15 reps  dowel   Moist Heat Therapy  Number Minutes Moist Heat 20 Minutes   Moist Heat Location Shoulder  both shoulders and neck ,low back    Manual Therapy   Passive ROM to both shoulders in diagonal and conventional patterns   Ankle Exercises: Supine   Other Supine Ankle Exercises ankle pumps x 20   Ankle Exercises: Seated   Heel Raises 20 reps   Toe Raise 20 reps          [facility] Goals - 06/07/14 1235    CC Short Term Goal  #1   Title pt will report a decrease in pain by 25%   Time 4   Period Weeks   Status On-going   CC Short Term Goal  #2   Title pt will be able to perfomr 5 repetition of sit to stand in 30 seconds   Time 4   Period Weeks   Status On-going   CC Short Term Goal  #3   Title pt will have a decrase in Quick Dash Scoure by 5 points   Period Weeks   Status On-going   CC Short Term Goal  #4   Title pt will be  able to perform a basic exercise program at home   Time 4   Status On-going             [facility] Goals - 06/07/14 1237    CC Long Term Goal  #1   Title pt will report decrease in pain by 50%   Time 8   Period Weeks   Status On-going   CC Long Term Goal  #2   Title pt will report increase in her ability to care for herself by 50%   Time 8   Status On-going   CC Long Term Goal  #3   Title pt will be able to stand for 3 minutes inable to prepare a meal for herself at home   Time 8   Period Weeks   Status On-going   CC Long Term Goal  #4   Title pt will be independent in an advanced home program.(includes stretch, strenghen and endurance component)   Time 8   Status On-going   CC Long Term Goal  #5   Title pt will have 125 degrees of right and left shoulder flexion for increased activities of daily living   Time 8   Period Weeks   Status On-going            Plan - 07/16/14 1739    PT Next Visit Plan Work in supine for UE, core and LE strengthening. reassess short term goal progress Reasees abilit to do sit to stand repeatedly  Gcode next        Problem List Patient Active Problem List   Diagnosis Date Noted  . Breast cancer of upper-outer quadrant of left female breast 01/24/2013  . Edema 01/09/2013  . Onychomycosis 01/09/2013  . Pain in joint, ankle and foot 01/09/2013   Donato Heinz. Owens Shark, PT  07/16/2014, 5:40 PM  [place], [place], [address] Phone: [phone]   Fax:  [phone]

## 2014-07-18 ENCOUNTER — Ambulatory Visit: Payer: BLUE CROSS/BLUE SHIELD | Admitting: Physical Therapy

## 2014-07-18 DIAGNOSIS — M24511 Contracture, right shoulder: Secondary | ICD-10-CM

## 2014-07-18 DIAGNOSIS — M6281 Muscle weakness (generalized): Secondary | ICD-10-CM

## 2014-07-18 DIAGNOSIS — M25511 Pain in right shoulder: Secondary | ICD-10-CM | POA: Diagnosis not present

## 2014-07-18 DIAGNOSIS — R296 Repeated falls: Secondary | ICD-10-CM

## 2014-07-18 DIAGNOSIS — M25411 Effusion, right shoulder: Secondary | ICD-10-CM
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
## 2014-07-23 ENCOUNTER — Ambulatory Visit: Payer: Medicare Other | Attending: General Surgery | Admitting: Physical Therapy

## 2014-07-23 DIAGNOSIS — M6281 Muscle weakness (generalized): Secondary | ICD-10-CM | POA: Diagnosis not present

## 2014-07-23 DIAGNOSIS — M25411 Effusion, right shoulder: Secondary | ICD-10-CM | POA: Insufficient documentation

## 2014-07-23 DIAGNOSIS — R296 Repeated falls: Secondary | ICD-10-CM | POA: Insufficient documentation

## 2014-07-23 DIAGNOSIS — M25511 Pain in right shoulder: Secondary | ICD-10-CM | POA: Insufficient documentation

## 2014-07-23 DIAGNOSIS — M24511 Contracture, right shoulder: Secondary | ICD-10-CM

## 2014-07-23 NOTE — Therapy (Signed)
[place], [place], [address] Phone: [phone]   Fax:  [phone]  Physical Therapy Treatment  Patient Details  Name: [name] MRN: [id number] Date of Birth: 1940/09/12 Referring Provider:  Willey Blade, MD  Encounter Date: 07/23/2014      PT End of Session - 07/23/14 1700    Visit Number 11   Number of Visits 17   Date for PT Re-Evaluation 08/02/14   PT Start Time 6579   PT Stop Time 1602   PT Time Calculation (min) 39 min      Past Medical History  Diagnosis Date  . Hyperlipidemia   . Hypertension   . Cold     just started 01/02/13 cough  . Complication of anesthesia     very phobic about needles.last surgery had to sedate before doing iv  . Exertional shortness of breath   . Uterine cancer   . Chronic lower back pain   . Depression   . Arthritis     "joints; mostly on the right side" (01/11/2013)  . Breast cancer     "left" (01/11/2013)  . Cancer     endometrial  . Allergy   . Osteoporosis     knees   . Status post radiation therapy within last four weeks 03/19/13-05/03/13    lt breast 60.4GY    Past Surgical History  Procedure Laterality Date  . Replacement total knee Bilateral 2011-2012  . Foot tendon surgery Right   . Breast lumpectomy with needle localization and axillary sentinel lymph node bx Left 01/11/2013  . Breast biopsy Left 12/2012  . Abdominal hysterectomy  12/14/2012  . Dilation and curettage of uterus      "@ least one" (01/11/2013)  . Tubal ligation    . Cholecystectomy  1970  . Knee arthroscopy Right 1990's    "twice" (01/11/2013)  . Breast lumpectomy with needle localization and axillary sentinel lymph node bx Left 01/11/2013    Procedure: BREAST LUMPECTOMY WITH NEEDLE LOCALIZATION AND AXILLARY SENTINEL LYMPH NODE BX;  Surgeon: Rolm Bookbinder, MD;  Location: [facility];  Service: General;  Laterality: Left;    There were no vitals taken for this  visit.  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 Yes   Pain Score 4    Pain Location Neck   Pain Radiating Towards toward shoulder                     [facility] Adult PT Treatment/Exercise - 07/23/14 0001    Elbow Exercises   Elbow Flexion Strengthening;15 reps;Both  4#   Elbow Extension Strengthening;10 reps  4#   Lumbar Exercises: Stretches   Pelvic Tilt 5 reps   Lumbar Exercises: Standing   Functional Squats 10 reps  form high mat   Row AROM;10 reps;Theraband;Both  10 reps in forward direction, right and left diagonal   Other Standing Lumbar Exercises toe taps onto 4" step x 20 reps   Lumbar Exercises: Supine   Ab Set 10 reps   Bent Knee Raise 10 reps   Other Supine Lumbar Exercises short ar quads over bolster with 4# x 20 reps each leg   Shoulder Exercises: Supine   Flexion AAROM;Both;20  reps   Moist Heat Therapy   Number Minutes Moist Heat 20 Minutes   Moist Heat Location --  left shoulder and neck    Ankle Exercises: Seated   Heel Raises 20 reps                   [facility] Term [facility] Goals - 06/07/14 1235    CC Short Term Goal  #1   Title pt will report a decrease in pain by 25%   Time 4   Period Weeks   Status On-going   CC Short Term Goal  #2   Title pt will be able to perfomr 5 repetition of sit to stand in 30 seconds   Time 4   Period Weeks   Status On-going   CC Short Term Goal  #3   Title pt will have a decrase in Quick Dash Scoure by 5 points   Period Weeks   Status On-going   CC Short Term Goal  #4   Title pt will be able to perform a basic exercise program at home   Time 4   Status On-going             [facility] Goals - 06/07/14 1237    CC Long Term Goal  #1   Title pt will report  decrease in pain by 50%   Time 8   Period Weeks   Status On-going   CC Long Term Goal  #2   Title pt will report increase in her ability to care for herself by 50%   Time 8   Status On-going   CC Long Term Goal  #3   Title pt will be able to stand for 3 minutes inable to prepare a meal for herself at home   Time 8   Period Weeks   Status On-going   CC Long Term Goal  #4   Title pt will be independent in an advanced home program.(includes stretch, strenghen and endurance component)   Time 8   Status On-going   CC Long Term Goal  #5   Title pt will have 125 degrees of right and left shoulder flexion for increased activities of daily living   Time 8   Period Weeks   Status On-going            Plan - 07/23/14 1700    Clinical Impression Statement pt continues to work hard and is showing improvement in core activity.  Feel she is ready to advance to exercise machines.   PT Next Visit Plan Reassess goals         Problem List Patient Active Problem List   Diagnosis Date Noted  . Breast cancer of upper-outer quadrant of left female breast 01/24/2013  . Edema 01/09/2013  . Onychomycosis 01/09/2013  . Pain in joint, ankle and foot 01/09/2013   Donato Heinz. Owens Shark, PT  07/23/2014, 5:04 PM  [place], [place], [address] Phone: [phone]   Fax:  [phone]

## 2014-07-25 ENCOUNTER — Ambulatory Visit: Payer: Medicare Other

## 2014-07-25 DIAGNOSIS — M25511 Pain in right shoulder: Secondary | ICD-10-CM | POA: Diagnosis not present

## 2014-07-25 DIAGNOSIS — M6281 Muscle weakness (generalized): Secondary | ICD-10-CM

## 2014-07-25 DIAGNOSIS — M24511 Contracture, right shoulder: Secondary | ICD-10-CM

## 2014-07-25 DIAGNOSIS — M25411 Effusion, right shoulder: Secondary | ICD-10-CM

## 2014-07-25 DIAGNOSIS — R296 Repeated falls: Secondary | ICD-10-CM

## 2014-07-25 NOTE — Therapy (Signed)
[place], [place], [address] Phone: [phone]   Fax:  [phone]  Physical Therapy Treatment  Patient Details  Name: [name] MRN: [id number] Date of Birth: Feb 25, 1941 Referring Provider:  Willey Blade, MD  Encounter Date: 07/25/2014      PT End of Session - 07/25/14 1604    Visit Number 12   Number of Visits 17   Date for PT Re-Evaluation 08/02/14   PT Start Time 1522   PT Stop Time 1605   PT Time Calculation (min) 43 min      Past Medical History  Diagnosis Date  . Hyperlipidemia   . Hypertension   . Cold     just started 01/02/13 cough  . Complication of anesthesia     very phobic about needles.last surgery had to sedate before doing iv  . Exertional shortness of breath   . Uterine cancer   . Chronic lower back pain   . Depression   . Arthritis     "joints; mostly on the right side" (01/11/2013)  . Breast cancer     "left" (01/11/2013)  . Cancer     endometrial  . Allergy   . Osteoporosis     knees   . Status post radiation therapy within last four weeks 03/19/13-05/03/13    lt breast 60.4GY    Past Surgical History  Procedure Laterality Date  . Replacement total knee Bilateral 2011-2012  . Foot tendon surgery Right   . Breast lumpectomy with needle localization and axillary sentinel lymph node bx Left 01/11/2013  . Breast biopsy Left 12/2012  . Abdominal hysterectomy  12/14/2012  . Dilation and curettage of uterus      "@ least one" (01/11/2013)  . Tubal ligation    . Cholecystectomy  1970  . Knee arthroscopy Right 1990's    "twice" (01/11/2013)  . Breast lumpectomy with needle localization and axillary sentinel lymph node bx Left 01/11/2013    Procedure: BREAST LUMPECTOMY WITH NEEDLE LOCALIZATION AND AXILLARY SENTINEL LYMPH NODE BX;  Surgeon: Rolm Bookbinder, MD;  Location: [facility];  Service: General;  Laterality: Left;    There were no vitals taken for this  visit.  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   Currently in Pain? Yes   Pain Score 4    Pain Location Shoulder   Pain Descriptors / Indicators Constant;Jabbing;Dull                    [facility] Adult PT Treatment/Exercise - 07/25/14 0001    Lumbar Exercises: Supine   Glut Set 10 reps  Leg Press down into mat   Glut Set Limitations Cuing to relax upper body   Bent Knee Raise 10 reps  4 lbs   Bridge 5 reps  2 sets   Bridge Limitations Very modified, more for core and glut contraction   Other Supine Lumbar Exercises short ar quads over bolster with 4# x 20 reps each leg   Other Supine Lumbar Exercises Dorsiflexion with green theraband    Knee/Hip Exercises: Seated   Long Arc Quad AROM;Both;2 sets;10 reps   Long Arc Quad Weight 4 lbs.   Shoulder Exercises: Seated   Flexion AROM;Strengthening;Both;10 reps  2 sets of 10  reps   Flexion Weight (lbs) 1   Other Seated Exercises Alternating bicep curls 3lbs 30 reps each   Other Seated Exercises Green Theraband: Elbow extension, scapular retraction and shoulder extension 15 reps each      When supine moist hot packs applied to pts neck and Lt shoulder.             Short Term Clinic Goals - 06/07/14 1235    CC Short Term Goal  #1   Title pt will report a decrease in pain by 25%   Time 4   Period Weeks   Status On-going   CC Short Term Goal  #2   Title pt will be able to perfomr 5 repetition of sit to stand in 30 seconds   Time 4   Period Weeks   Status On-going   CC Short Term Goal  #3   Title pt will have a decrase in Quick Dash Scoure by 5 points   Period Weeks   Status On-going   CC Short Term Goal  #4   Title pt will be able to perform a basic exercise program at home   Time 4   Status On-going              [facility] Goals - 06/07/14 1237    CC Long Term Goal  #1   Title pt will report decrease in pain by 50%   Time 8   Period Weeks   Status On-going   CC Long Term Goal  #2   Title pt will report increase in her ability to care for herself by 50%   Time 8   Status On-going   CC Long Term Goal  #3   Title pt will be able to stand for 3 minutes inable to prepare a meal for herself at home   Time 8   Period Weeks   Status On-going   CC Long Term Goal  #4   Title pt will be independent in an advanced home program.(includes stretch, strenghen and endurance component)   Time 8   Status On-going   CC Long Term Goal  #5   Title pt will have 125 degrees of right and left shoulder flexion for increased activities of daily living   Time 8   Period Weeks   Status On-going            Plan - 07/25/14 1605    Clinical Impression Statement Pt did very well with treatment and works continuously with minimal rest breaks.    Pt will benefit from skilled therapeutic intervention in order to improve on the following deficits Abnormal gait;Decreased strength;Pain;Difficulty walking;Decreased activity tolerance;Decreased mobility;Impaired UE functional use;Decreased range of motion;Decreased balance;Decreased safety awareness;Postural dysfunction;Decreased endurance;Obesity   Clinical Impairments Affecting Rehab Potential longstanding deconditioning and mobility impairment   PT Frequency 2x / week   PT Duration 8 weeks   PT Treatment/Interventions Therapeutic exercise;Balance training;Passive range of motion;Neuromuscular re-education;DME Instruction;Patient/family education;Gait training;Moist Heat;Manual techniques;Functional mobility training;Therapeutic activities   PT Next Visit Plan Reassess goals. Begin strengthening with machines.    Consulted and Agree with Plan of Care Patient        Problem List Patient Active Problem List   Diagnosis Date Noted  . Breast  cancer of upper-outer quadrant of left female breast 01/24/2013  . Edema 01/09/2013  . Onychomycosis 01/09/2013  . Pain in joint, ankle and foot 01/09/2013    Otelia Limes, PTA 07/25/2014, 4:11 PM  [facility]  [place], [place], [address] Phone: [phone]   Fax:  [phone]

## 2014-07-30 ENCOUNTER — Ambulatory Visit: Payer: Medicare Other | Admitting: Physical Therapy

## 2014-07-31 ENCOUNTER — Telehealth: Payer: Self-pay | Admitting: Nurse Practitioner

## 2014-07-31 NOTE — Telephone Encounter (Signed)
S/w pt and she is aware of her new appt due to chemo/hold spots,a new calendar was mailed to the pt     [name]

## 2014-08-01 ENCOUNTER — Ambulatory Visit: Payer: Medicare Other | Admitting: Physical Therapy

## 2014-08-01 DIAGNOSIS — M6281 Muscle weakness (generalized): Secondary | ICD-10-CM

## 2014-08-01 DIAGNOSIS — M25511 Pain in right shoulder: Secondary | ICD-10-CM

## 2014-08-01 DIAGNOSIS — M25411 Effusion, right shoulder: Secondary | ICD-10-CM

## 2014-08-01 DIAGNOSIS — R296 Repeated falls: Secondary | ICD-10-CM

## 2014-08-01 DIAGNOSIS — M24511 Contracture, right shoulder: Secondary | ICD-10-CM

## 2014-08-01 NOTE — Therapy (Signed)
[place], [place], [address] Phone: [phone]   Fax:  [phone]  Physical Therapy Treatment  Patient Details  Name: [name] MRN: [id number] Date of Birth: [birth year] Referring Provider:  Willey Blade, MD  Encounter Date: 08/01/2014    Past Medical History  Diagnosis Date  . Hyperlipidemia   . Hypertension   . Cold     just started 01/02/13 cough  . Complication of anesthesia     very phobic about needles.last surgery had to sedate before doing iv  . Exertional shortness of breath   . Uterine cancer   . Chronic lower back pain   . Depression   . Arthritis     "joints; mostly on the right side" (01/11/2013)  . Breast cancer     "left" (01/11/2013)  . Cancer     endometrial  . Allergy   . Osteoporosis     knees   . Status post radiation therapy within last four weeks 03/19/13-05/03/13    lt breast 60.4GY    Past Surgical History  Procedure Laterality Date  . Replacement total knee Bilateral 2011-2012  . Foot tendon surgery Right   . Breast lumpectomy with needle localization and axillary sentinel lymph node bx Left 01/11/2013  . Breast biopsy Left 12/2012  . Abdominal hysterectomy  12/14/2012  . Dilation and curettage of uterus      "@ least one" (01/11/2013)  . Tubal ligation    . Cholecystectomy  1970  . Knee arthroscopy Right 1990's    "twice" (01/11/2013)  . Breast lumpectomy with needle localization and axillary sentinel lymph node bx Left 01/11/2013    Procedure: BREAST LUMPECTOMY WITH NEEDLE LOCALIZATION AND AXILLARY SENTINEL LYMPH NODE BX;  Surgeon: Rolm Bookbinder, MD;  Location: [facility];  Service: General;  Laterality: Left;    There were no vitals filed for this visit.  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  She drove here today as [employer] has been unreliable. She came in with a hemiwalker, still flexed at trunk. but able to walk in from parking lot.  She does not feel that she has enough strength to work out with exercise machines.    Pertinent History breast cancer and treatment, arthritis, got hit in the back of head with a gymnastic ring and has had pain in back since.   Currently in Pain? Yes   Pain Score 4    Pain Location Neck   Pain Orientation Left   Pain Radiating Towards toward and upper arm            [facility] PT Assessment - 08/01/14 0001    Sit to Stand   Comments 6 sit to stand repetitions in 30 seconds              Quick Dash - 08/01/14 0001    Open a tight or new jar Moderate difficulty   Do heavy household chores (wash walls, wash floors) Unable   Carry a shopping bag or briefcase Unable   Wash your back Unable   Use a knife to cut food No difficulty   Recreational activities in which you take some  force or impact through your arm, shoulder, or hand (golf, hammering, tennis) Severe difficulty   During the past week, to what extent has your arm, shoulder or hand problem interfered with your normal social activities with family, friends, neighbors, or groups? Modererately   During the past week, to what extent has your arm, shoulder or hand problem limited your work or other regular daily activities Quite a bit   Arm, shoulder, or hand pain. Severe   Tingling (pins and needles) in your arm, shoulder, or hand Moderate   Difficulty Sleeping Severe difficulty   DASH Score 68.18 %             [facility] Adult PT Treatment/Exercise - 08/01/14 0001    Knee/Hip Exercises: Standing   Functional Squat --  6 reptitions   Other Standing Knee Exercises static standing for 55mnutes 30 seconds with not balance loss,    Moist Heat Therapy   Number Minutes  Moist Heat 20 Minutes   Moist Heat Location Shoulder  left shoulder, neck    Manual Therapy   Myofascial Release to paracervical muscles and upper trap on left    Passive ROM gentle cervical ROM   Manual Traction gentle to cervical area for prolonged stretch                   Short Term Clinic Goals - 08/01/14 1530    CC Short Term Goal  #1   Title pt will report a decrease in pain by 25%  pt thinks we have decreased pain by only 10-15 %   Time 4   Period Weeks   Status Partially Met   CC Short Term Goal  #2   Title pt will be able to perfomr 5 repetition of sit to stand in 30 seconds   Time 4   Status Achieved   CC Short Term Goal  #3   Title pt will have a decrase in Quick Dash Scoure by 5 points  score decreased from 86..36. to 68.18   Status Achieved   CC Short Term Goal  #4   Title pt will be able to perform a basic exercise program at home   [place]- 08/01/14 1556    CC Long Term Goal  #1   Title pt will report decrease in pain by 50%   Time 8   Status On-going   CC Long Term Goal  #2   Title pt will report increase in her ability to care for herself by 50%   Time 8   Period Weeks   Status On-going   CC Long Term Goal  #3   Title pt will be able to stand for 3 minutes inable to prepare a meal for herself at home  pt able to stand for 2 minutes and 30 seconds.    Status Partially Met   CC Long Term Goal  #4   Title pt will be independent in an advanced home program.(includes stretch, strenghen and endurance component)   Time 8   Period Weeks   Status On-going   CC Long Term Goal  #5   Title pt will have 125 degrees of right and left shoulder flexion for increased activities of daily living   Status On-going            Plan - 08/01/14 1716    Clinical Impression Statement Pt with c/o  neck pain and comes in with fatigue from driving here and walking in from parking lot Goals were reassess.   While she has made gains, she is still working toward many and wants to continue with physical therapy 2x/week for 8 more weeks with the ultimate goal of going to at water exercise  class to  continue stretnghtening.   Rehab Potential Good   Clinical Impairments Affecting Rehab Potential longstanding deconditioning and mobility impairment   PT Frequency 2x / week   PT Duration 8 weeks   PT Treatment/Interventions Therapeutic exercise;Balance training;Passive range of motion;Neuromuscular re-education;DME Instruction;Patient/family education;Gait training;Moist Heat;Manual techniques;Functional mobility training;Therapeutic activities   PT Next Visit Plan Upgrade home exrcise program to include more cervical range of motion, core work sidelying and standing activities   Consulted and Agree with Plan of Care Patient        Problem List Patient Active Problem List   Diagnosis Date Noted  . Breast cancer of upper-outer quadrant of left female breast 01/24/2013  . Edema 01/09/2013  . Onychomycosis 01/09/2013  . Pain in joint, ankle and foot 01/09/2013   Donato Heinz. Owens Shark, PT  08/01/2014, 5:24 PM  [place] [place], [place], [address] Phone: [phone]   Fax:  [phone]

## 2014-08-06 ENCOUNTER — Ambulatory Visit: Payer: MEDICARE | Admitting: Nurse Practitioner

## 2014-08-06 ENCOUNTER — Other Ambulatory Visit: Payer: MEDICARE

## 2014-08-08 ENCOUNTER — Ambulatory Visit: Payer: Medicare Other | Admitting: Physical Therapy

## 2014-08-08 DIAGNOSIS — R296 Repeated falls: Secondary | ICD-10-CM

## 2014-08-08 DIAGNOSIS — M25411 Effusion, right shoulder: Secondary | ICD-10-CM

## 2014-08-08 DIAGNOSIS — M24511 Contracture, right shoulder: Secondary | ICD-10-CM

## 2014-08-08 DIAGNOSIS — M25511 Pain in right shoulder: Secondary | ICD-10-CM

## 2014-08-08 DIAGNOSIS — M6281 Muscle weakness (generalized): Secondary | ICD-10-CM

## 2014-08-08 NOTE — Therapy (Signed)
[place], [place], [address] Phone: [phone]   Fax:  [phone]  Physical Therapy Treatment  Patient Details  Name: [name] MRN: [id number] Date of Birth: Nov 13, 1940 Referring Provider:  Willey Blade, MD  Encounter Date: 08/08/2014      PT End of Session - 08/08/14 1532    Visit Number 13   Number of Visits 33   Date for PT Re-Evaluation 10/02/14   PT Start Time 3790   PT Stop Time 1600   PT Time Calculation (min) 45 min      Past Medical History  Diagnosis Date  . Hyperlipidemia   . Hypertension   . Cold     just started 01/02/13 cough  . Complication of anesthesia     very phobic about needles.last surgery had to sedate before doing iv  . Exertional shortness of breath   . Uterine cancer   . Chronic lower back pain   . Depression   . Arthritis     "joints; mostly on the right side" (01/11/2013)  . Breast cancer     "left" (01/11/2013)  . Cancer     endometrial  . Allergy   . Osteoporosis     knees   . Status post radiation therapy within last four weeks 03/19/13-05/03/13    lt breast 60.4GY    Past Surgical History  Procedure Laterality Date  . Replacement total knee Bilateral 2011-2012  . Foot tendon surgery Right   . Breast lumpectomy with needle localization and axillary sentinel lymph node bx Left 01/11/2013  . Breast biopsy Left 12/2012  . Abdominal hysterectomy  12/14/2012  . Dilation and curettage of uterus      "@ least one" (01/11/2013)  . Tubal ligation    . Cholecystectomy  1970  . Knee arthroscopy Right 1990's    "twice" (01/11/2013)  . Breast lumpectomy with needle localization and axillary sentinel lymph node bx Left 01/11/2013    Procedure: BREAST LUMPECTOMY WITH NEEDLE LOCALIZATION AND AXILLARY SENTINEL LYMPH NODE BX;  Surgeon: Rolm Bookbinder, MD;  Location: [facility];  Service: General;  Laterality: Left;    There were no vitals filed for this  visit.  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  She is still having pain that was affected by the weather.  She came by [facility] today.   Pertinent History breast cancer and treatment, arthritis, got hit in the back of head with a gymnastic ring and has had pain in back since.    Currently in Pain? Yes   Pain Score 4    Pain Location Shoulder   Pain Orientation Left   Pain Descriptors / Indicators Dull   Pain Type Chronic pain                       [facility] Adult PT Treatment/Exercise - 08/08/14 0001    Elbow Exercises   Elbow Flexion Strengthening;10 reps  4# on left 2 # on the right   Elbow Extension Strengthening;10 reps  4# on left , 2# on right    Lumbar Exercises: Stretches   Pelvic Tilt 5 reps  Lumbar Exercises: Supine   Clam 10 reps   Bent Knee Raise 10 reps   Knee/Hip Exercises: Supine   Short Arc Quad Sets Strengthening;Both;20 reps  10 with 4 # 10 with 8#   Shoulder Exercises: Supine   Flexion AAROM;Both;20 reps  at only about 45 degree flexion to be below painful range   Moist Heat Therapy   Number Minutes Moist Heat 20 Minutes   Moist Heat Location Shoulder  neck   Manual Therapy   Myofascial Release to paracervical muscles and upper trap on left    Manual Traction gentle to cervical area for prolonged stretch                   Short Term Clinic Goals - 08/01/14 1530    CC Short Term Goal  #1   Title pt will report a decrease in pain by 25%  pt thinks we have decreased pain by only 10-15 %   Time 4   Period Weeks   Status Partially Met   CC Short Term Goal  #2   Title pt will be able to perfomr 5 repetition of sit to stand in 30 seconds   Time 4    Status Achieved   CC Short Term Goal  #3   Title pt will have a decrase in Quick Dash Scoure by 5 points  score decreased from 86..36. to 68.18   Status Achieved   CC Short Term Goal  #4   Title pt will be able to perform a basic exercise program at home   [place] - 08/01/14 1556    CC Long Term Goal  #1   Title pt will report decrease in pain by 50%   Time 8   Status On-going   CC Long Term Goal  #2   Title pt will report increase in her ability to care for herself by 50%   Time 8   Period Weeks   Status On-going   CC Long Term Goal  #3   Title pt will be able to stand for 3 minutes inable to prepare a meal for herself at home  pt able to stand for 2 minutes and 30 seconds.    Status Partially Met   CC Long Term Goal  #4   Title pt will be independent in an advanced home program.(includes stretch, strenghen and endurance component)   Time 8   Period Weeks   Status On-going   CC Long Term Goal  #5   Title pt will have 125 degrees of right and left shoulder flexion for increased activities of daily living   Status On-going            Plan - 08/08/14 1754    Clinical Impression Statement pt talked about functional problems with bathing and dressing because of shoulder problems and that her reacher is broken.  She was able to have more active movement of shoulders with less pain after treatment today.  she is porgressing with the amount of weight she is able to lift with her legs   PT Next Visit Plan Upgrade home exrcise program to include more cervical range of motion, core work sidelying and standing activities        Problem List Patient Active Problem List   Diagnosis Date Noted  . Breast cancer of upper-outer quadrant of left female breast 01/24/2013  .  Edema 01/09/2013  . Onychomycosis 01/09/2013  . Pain in joint, ankle and foot 01/09/2013   Donato Heinz. Owens Shark, PT  08/08/2014, 5:57 PM  [place], [place], [address] Phone: [phone]   Fax:  [phone]

## 2014-08-13 ENCOUNTER — Ambulatory Visit: Payer: Medicare Other | Admitting: Physical Therapy

## 2014-08-13 DIAGNOSIS — M25511 Pain in right shoulder: Secondary | ICD-10-CM | POA: Diagnosis not present

## 2014-08-13 DIAGNOSIS — M25411 Effusion, right shoulder: Secondary | ICD-10-CM

## 2014-08-13 DIAGNOSIS — M6281 Muscle weakness (generalized): Secondary | ICD-10-CM

## 2014-08-13 DIAGNOSIS — R296 Repeated falls: Secondary | ICD-10-CM

## 2014-08-13 DIAGNOSIS — M24511 Contracture, right shoulder: Secondary | ICD-10-CM

## 2014-08-13 NOTE — Therapy (Signed)
[place], [place], [address] Phone: [phone]   Fax:  [phone]  Physical Therapy Treatment  Patient Details  Name: [name] MRN: [id number] Date of Birth: July 12, 1940 Referring Provider:  Willey Blade, MD  Encounter Date: 08/13/2014      PT End of Session - 08/13/14 1527    Visit Number 14   Number of Visits 33   Date for PT Re-Evaluation 10/02/14   PT Start Time 6073   PT Stop Time 1600   PT Time Calculation (min) 45 min      Past Medical History  Diagnosis Date  . Hyperlipidemia   . Hypertension   . Cold     just started 01/02/13 cough  . Complication of anesthesia     very phobic about needles.last surgery had to sedate before doing iv  . Exertional shortness of breath   . Uterine cancer   . Chronic lower back pain   . Depression   . Arthritis     "joints; mostly on the right side" (01/11/2013)  . Breast cancer     "left" (01/11/2013)  . Cancer     endometrial  . Allergy   . Osteoporosis     knees   . Status post radiation therapy within last four weeks 03/19/13-05/03/13    lt breast 60.4GY    Past Surgical History  Procedure Laterality Date  . Replacement total knee Bilateral 2011-2012  . Foot tendon surgery Right   . Breast lumpectomy with needle localization and axillary sentinel lymph node bx Left 01/11/2013  . Breast biopsy Left 12/2012  . Abdominal hysterectomy  12/14/2012  . Dilation and curettage of uterus      "@ least one" (01/11/2013)  . Tubal ligation    . Cholecystectomy  1970  . Knee arthroscopy Right 1990's    "twice" (01/11/2013)  . Breast lumpectomy with needle localization and axillary sentinel lymph node bx Left 01/11/2013    Procedure: BREAST LUMPECTOMY WITH NEEDLE LOCALIZATION AND AXILLARY SENTINEL LYMPH NODE BX;  Surgeon: Rolm Bookbinder, MD;  Location: [facility];  Service: General;  Laterality: Left;    There were no vitals filed for this  visit.  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  She also thinks the weather is causing some problems also    Currently in Pain? Yes   Pain Score 6    Pain Location Shoulder   Pain Orientation Left   Pain Descriptors / Indicators --  "it s a serious pain"    Pain Radiating Towards moving down toward upper arm and in the bicep   Pain Onset More than a month ago                       [facility] Adult PT Treatment/Exercise - 08/13/14 0001    Elbow Exercises   Elbow Flexion Strengthening;10 reps  4# on left and right   Elbow Extension Strengthening;10 reps  3# on left and right    Lumbar Exercises: Supine   Clam 10 reps   Bent Knee Raise 10 reps   Other Supine Lumbar Exercises wand exercsise with both arms x 10 reps   Knee/Hip  Exercises: Supine   Short Arc Quad Sets Strengthening;Both;20 reps  10 with 4 # 10 with 8#   Shoulder Exercises: Supine   Flexion 10 reps  pulling out on red theraband raising arms below shoulder lev   Other Supine Exercises red theraband "draw the sword"    Other Supine Exercises bilateral external rotation with elbows bent at sides with red theraband., also with elbows straight.    Moist Heat Therapy   Number Minutes Moist Heat 20 Minutes   Moist Heat Location Shoulder  neck   Manual Therapy   Passive ROM PROM within pain tolerance to both shoulders.                   Short Term Clinic Goals - 08/01/14 1530    CC Short Term Goal  #1   Title pt will report a decrease in pain by 25%  pt thinks we have decreased pain by only 10-15 %   Time 4   Period Weeks   Status Partially Met   CC Short Term Goal  #2   Title pt will be able to perfomr 5 repetition of sit to stand in 30  seconds   Time 4   Status Achieved   CC Short Term Goal  #3   Title pt will have a decrase in Quick Dash Scoure by 5 points  score decreased from 86..36. to 68.18   Status Achieved   CC Short Term Goal  #4   Title pt will be able to perform a basic exercise program at home   [place] - 08/01/14 1556    CC Long Term Goal  #1   Title pt will report decrease in pain by 50%   Time 8   Status On-going   CC Long Term Goal  #2   Title pt will report increase in her ability to care for herself by 50%   Time 8   Period Weeks   Status On-going   CC Long Term Goal  #3   Title pt will be able to stand for 3 minutes inable to prepare a meal for herself at home  pt able to stand for 2 minutes and 30 seconds.    Status Partially Met   CC Long Term Goal  #4   Title pt will be independent in an advanced home program.(includes stretch, strenghen and endurance component)   Time 8   Period Weeks   Status On-going   CC Long Term Goal  #5   Title pt will have 125 degrees of right and left shoulder flexion for increased activities of daily living   Status On-going            Plan - 08/13/14 1728    Clinical Impression Statement continues to be limited functionally by pain in left shoulder limiting progression to exercise machines. Hopeful to progress to standing exercise next visit as pain allows   PT Next Visit Plan Upgrade home exrcise program to include more cervical range of motion, core work sidelying and standing activities   Consulted and Agree with Plan of Care Patient        Problem List Patient Active Problem List   Diagnosis Date Noted  . Breast cancer of upper-outer quadrant of left female breast 01/24/2013  . Edema 01/09/2013  . Onychomycosis 01/09/2013  . Pain in joint, ankle and foot 01/09/2013  Donato Heinz. Owens Shark, PT 08/13/2014, 5:31 PM  [place], [place], [address] Phone: [phone]   Fax:  [phone]

## 2014-08-15 ENCOUNTER — Ambulatory Visit: Payer: Medicare Other | Admitting: Physical Therapy

## 2014-08-15 DIAGNOSIS — M25511 Pain in right shoulder: Secondary | ICD-10-CM | POA: Diagnosis not present

## 2014-08-15 DIAGNOSIS — M6281 Muscle weakness (generalized): Secondary | ICD-10-CM

## 2014-08-15 DIAGNOSIS — M25411 Effusion, right shoulder: Secondary | ICD-10-CM

## 2014-08-15 DIAGNOSIS — R296 Repeated falls: Secondary | ICD-10-CM

## 2014-08-15 DIAGNOSIS — M24511 Contracture, right shoulder: Secondary | ICD-10-CM

## 2014-08-15 NOTE — Therapy (Signed)
[place], [place], [address] Phone: [phone]   Fax:  [phone]  Physical Therapy Treatment  Patient Details  Name: [name] MRN: [id number] Date of Birth: 06-May-1941 Referring Provider:  Willey Blade, MD  Encounter Date: 08/15/2014      PT End of Session - 08/15/14 1551    Visit Number 15   Number of Visits 33   Date for PT Re-Evaluation 10/02/14   PT Start Time 7902   PT Stop Time 1600   PT Time Calculation (min) 45 min      Past Medical History  Diagnosis Date  . Hyperlipidemia   . Hypertension   . Cold     just started 01/02/13 cough  . Complication of anesthesia     very phobic about needles.last surgery had to sedate before doing iv  . Exertional shortness of breath   . Uterine cancer   . Chronic lower back pain   . Depression   . Arthritis     "joints; mostly on the right side" (01/11/2013)  . Breast cancer     "left" (01/11/2013)  . Cancer     endometrial  . Allergy   . Osteoporosis     knees   . Status post radiation therapy within last four weeks 03/19/13-05/03/13    lt breast 60.4GY    Past Surgical History  Procedure Laterality Date  . Replacement total knee Bilateral 2011-2012  . Foot tendon surgery Right   . Breast lumpectomy with needle localization and axillary sentinel lymph node bx Left 01/11/2013  . Breast biopsy Left 12/2012  . Abdominal hysterectomy  12/14/2012  . Dilation and curettage of uterus      "@ least one" (01/11/2013)  . Tubal ligation    . Cholecystectomy  1970  . Knee arthroscopy Right 1990's    "twice" (01/11/2013)  . Breast lumpectomy with needle localization and axillary sentinel lymph node bx Left 01/11/2013    Procedure: BREAST LUMPECTOMY WITH NEEDLE LOCALIZATION AND AXILLARY SENTINEL LYMPH NODE BX;  Surgeon: Rolm Bookbinder, MD;  Location: [facility];  Service: General;  Laterality: Left;    There were no vitals filed for this  visit.  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   Knee/Hip Exercises: Supine   Short Arc Quad Sets Strengthening;10 reps  6# on both . Extra 10 reps with 8# on right leg   Knee/Hip Exercises: Sidelying   Hip ABduction AROM;2 sets;10 reps;Both   Clams 10 reps with emphasis for core engagement    Shoulder Exercises: Seated   Flexion Strengthening;Both;Weights;10 reps   Abduction AROM;10 reps  with elbows bent    Other Seated Exercises shoulder circles.    Shoulder Exercises: Sidelying   External Rotation Strengthening;Both;10 reps  1#   ABduction Strengthening;10 reps  1#                   Short Term Clinic Goals - 08/15/14 1705    CC Short Term Goal  #1   Title pt will report a decrease in pain by 25%   Status Partially Met   CC Short Term Goal  #2   Title pt will be able to perfomr 5 repetition of sit to stand in 30 seconds   Status Achieved   CC Short Term Goal  #3   Title pt will have a decrase in Quick Dash Scoure by 5 points   Status Achieved   CC Short Term Goal  #4   Title pt will be able to perform a basic exercise program at home   Status Achieved             [facility] Goals - 08/15/14 1602    CC Long Term Goal  #1   Title pt will report decrease in pain by 50%   Status On-going   CC Long Term Goal  #2   Title pt will report increase in her ability to care for herself by 50%   Status On-going   CC Long  Term Goal  #3   Title pt will be able to stand for 3 minutes inable to prepare a meal for herself at home   CC Long Term Goal  #4   Title pt will be independent in an advanced home program.(includes stretch, strenghen and endurance component)   Status On-going   CC Long Term Goal  #5   Title pt will have 125 degrees of right and left shoulder flexion for increased activities of daily living   Status On-going            Plan - 08/15/14 1551    Clinical Impression Statement worked out in [employer] today with patient more comfortable on double width mat and was able to perform extremtiy and core exercise in different positions to upgrade execise program  Pt reports she feels better after treatment and appeared to be walking better   PT Frequency 2x / week   PT Next Visit Plan Continue to work out on [employer] with wider mat and progress as able         Problem List Patient Active Problem List   Diagnosis Date Noted  . Breast cancer of upper-outer quadrant of left female breast 01/24/2013  . Edema 01/09/2013  . Onychomycosis 01/09/2013  . Pain in joint, ankle and foot 01/09/2013   Donato Heinz. Owens Shark, PT  08/15/2014, 5:06 PM  [place], [place], [address] Phone: [phone]   Fax:  [phone]

## 2014-08-20 ENCOUNTER — Ambulatory Visit: Payer: Medicare Other | Admitting: Physical Therapy

## 2014-08-20 DIAGNOSIS — R296 Repeated falls: Secondary | ICD-10-CM

## 2014-08-20 DIAGNOSIS — M25411 Effusion, right shoulder: Secondary | ICD-10-CM

## 2014-08-20 DIAGNOSIS — M24511 Contracture, right shoulder: Secondary | ICD-10-CM

## 2014-08-20 DIAGNOSIS — M6281 Muscle weakness (generalized): Secondary | ICD-10-CM

## 2014-08-20 DIAGNOSIS — M25511 Pain in right shoulder: Secondary | ICD-10-CM | POA: Diagnosis not present

## 2014-08-20 NOTE — Therapy (Signed)
[place], [place], [address] Phone: [phone]   Fax:  [phone]  Physical Therapy Treatment  Patient Details  Name: [name] MRN: [id number] Date of Birth: 06-23-1940 Referring Provider:  Willey Blade, MD  Encounter Date: 08/20/2014      PT End of Session - 08/20/14 1556    Visit Number 16   Number of Visits 33   Date for PT Re-Evaluation 10/02/14   PT Start Time 3888   PT Stop Time 1600   PT Time Calculation (min) 44 min      Past Medical History  Diagnosis Date  . Hyperlipidemia   . Hypertension   . Cold     just started 01/02/13 cough  . Complication of anesthesia     very phobic about needles.last surgery had to sedate before doing iv  . Exertional shortness of breath   . Uterine cancer   . Chronic lower back pain   . Depression   . Arthritis     "joints; mostly on the right side" (01/11/2013)  . Breast cancer     "left" (01/11/2013)  . Cancer     endometrial  . Allergy   . Osteoporosis     knees   . Status post radiation therapy within last four weeks 03/19/13-05/03/13    lt breast 60.4GY    Past Surgical History  Procedure Laterality Date  . Replacement total knee Bilateral 2011-2012  . Foot tendon surgery Right   . Breast lumpectomy with needle localization and axillary sentinel lymph node bx Left 01/11/2013  . Breast biopsy Left 12/2012  . Abdominal hysterectomy  12/14/2012  . Dilation and curettage of uterus      "@ least one" (01/11/2013)  . Tubal ligation    . Cholecystectomy  1970  . Knee arthroscopy Right 1990's    "twice" (01/11/2013)  . Breast lumpectomy with needle localization and axillary sentinel lymph node bx Left 01/11/2013    Procedure: BREAST LUMPECTOMY WITH NEEDLE LOCALIZATION AND AXILLARY SENTINEL LYMPH NODE BX;  Surgeon: Rolm Bookbinder, MD;  Location: [facility];  Service: General;  Laterality: Left;    There were no vitals filed for this  visit.  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    Currently in Pain? Yes   Pain Score 5    Pain Location Neck   Pain Orientation Left   Pain Descriptors / Indicators Aching   Pain Radiating Towards from neck all the way down to bicep                       [facility] Adult PT Treatment/Exercise - 08/20/14 0001    Elbow Exercises   Elbow Flexion Strengthening;Seated;10 reps  3# with 3 sets of 10    Knee/Hip Exercises: Seated   Long Arc Quad Strengthening;2 sets;Weights;15 reps  4#   Other Seated Knee Exercises marches 10 reps with 4# on each leg.   Knee/Hip Exercises: Supine   Short Arc Quad Sets Strengthening;10 reps  6# on both . Extra  10 reps with 8# on right leg   Knee/Hip Exercises: Sidelying   Hip ABduction AROM;2 sets;10 reps;Both   Clams 10 reps with emphasis for core engagement    Shoulder Exercises: Seated   Abduction --  with elbows bent    Shoulder Exercises: Sidelying   External Rotation Strengthening;Both;10 reps  2#   ABduction Strengthening;10 reps  3#   Moist Heat Therapy   Number Minutes Moist Heat 20 Minutes   Moist Heat Location Shoulder  left shoulder and neck                   Short Term Clinic Goals - 08/20/14 1647    CC Short Term Goal  #1   Title pt will report a decrease in pain by 25%   Status Partially Met   CC Short Term Goal  #2   Title pt will be able to perfomr 5 repetition of sit to stand in 30 seconds   Status Achieved   CC Short Term Goal  #3   Title pt will have a decrase in Quick Dash Scoure by 5 points   Status  Achieved   CC Short Term Goal  #4   Title pt will be able to perform a basic exercise program at home   [facility] Goals - 08/20/14 1647    CC Long Term Goal  #1   Title pt will report decrease in pain by 50%   Status On-going   CC Long Term Goal  #2   Title pt will report increase in her ability to care for herself by 50%   Status On-going   CC Long Term Goal  #3   Title pt will be able to stand for 3 minutes inable to prepare a meal for herself at home   Status Partially Met   CC Long Term Goal  #4   Title pt will be independent in an advanced home program.(includes stretch, strenghen and endurance component)   Status On-going   CC Long Term Goal  #5   Title pt will have 125 degrees of right and left shoulder flexion for increased activities of daily living   Status On-going            Plan - 08/20/14 1557    Clinical Impression Statement pt continued with work on wide mat in gym and needs some assist with rolling .She has anxiet with this change of position. She continues to get some pain relief with exercise and mobility. She needs continued physical therapy to achieve increased strength with decreased pain to improve her function at home   PT Next Visit Plan Continue to work out on ortho gym with wider mat and progress as able to standing free motion machine and other equipment        Problem List Patient Active Problem List   Diagnosis Date Noted  . Breast cancer of upper-outer quadrant of left female breast 01/24/2013  . Edema 01/09/2013  . Onychomycosis 01/09/2013  . Pain in joint, ankle and foot 01/09/2013   Donato Heinz. Owens Shark, PT  08/20/2014, 4:50 PM  [place], [place], [address] Phone: [phone]   Fax:  [phone]

## 2014-08-22 ENCOUNTER — Ambulatory Visit: Payer: Medicare Other

## 2014-08-27 ENCOUNTER — Ambulatory Visit: Payer: Medicare Other | Attending: General Surgery | Admitting: Physical Therapy

## 2014-08-27 DIAGNOSIS — R296 Repeated falls: Secondary | ICD-10-CM | POA: Insufficient documentation

## 2014-08-27 DIAGNOSIS — M25511 Pain in right shoulder: Secondary | ICD-10-CM | POA: Diagnosis not present

## 2014-08-27 DIAGNOSIS — M6281 Muscle weakness (generalized): Secondary | ICD-10-CM | POA: Diagnosis not present

## 2014-08-27 DIAGNOSIS — M25411 Effusion, right shoulder: Secondary | ICD-10-CM | POA: Diagnosis not present

## 2014-08-27 DIAGNOSIS — M24511 Contracture, right shoulder: Secondary | ICD-10-CM

## 2014-08-27 NOTE — Therapy (Signed)
[place], [place], [address] Phone: [phone]   Fax:  [phone]  Physical Therapy Treatment  Patient Details  Name: [name] MRN: [id number] Date of Birth: 1940/10/20 Referring Provider:  Willey Blade, MD  Encounter Date: 08/27/2014      PT End of Session - 08/27/14 1559    Visit Number 17   Number of Visits 33   Date for PT Re-Evaluation 10/02/14   PT Start Time 1500   PT Stop Time 1559   PT Time Calculation (min) 59 min      Past Medical History  Diagnosis Date  . Hyperlipidemia   . Hypertension   . Cold     just started 01/02/13 cough  . Complication of anesthesia     very phobic about needles.last surgery had to sedate before doing iv  . Exertional shortness of breath   . Uterine cancer   . Chronic lower back pain   . Depression   . Arthritis     "joints; mostly on the right side" (01/11/2013)  . Breast cancer     "left" (01/11/2013)  . Cancer     endometrial  . Allergy   . Osteoporosis     knees   . Status post radiation therapy within last four weeks 03/19/13-05/03/13    lt breast 60.4GY    Past Surgical History  Procedure Laterality Date  . Replacement total knee Bilateral 2011-2012  . Foot tendon surgery Right   . Breast lumpectomy with needle localization and axillary sentinel lymph node bx Left 01/11/2013  . Breast biopsy Left 12/2012  . Abdominal hysterectomy  12/14/2012  . Dilation and curettage of uterus      "@ least one" (01/11/2013)  . Tubal ligation    . Cholecystectomy  1970  . Knee arthroscopy Right 1990's    "twice" (01/11/2013)  . Breast lumpectomy with needle localization and axillary sentinel lymph node bx Left 01/11/2013    Procedure: BREAST LUMPECTOMY WITH NEEDLE LOCALIZATION AND AXILLARY SENTINEL LYMPH NODE BX;  Surgeon: Rolm Bookbinder, MD;  Location: [facility];  Service: General;  Laterality: Left;    There were no vitals filed for this  visit.  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 Yes   Pain Score 4    Pain Location Neck   Pain Orientation Left;Right   Pain Descriptors / Indicators Aching   Pain Type Chronic pain   Pain Radiating Towards neck toward shoulders            [facility] Adult PT Treatment/Exercise - 08/27/14 0001    Elbow Exercises   Elbow Flexion Strengthening;10 reps;Supine  3# with 3 sets of 10    Elbow Extension Strengthening;10 reps  3# on left and right    Knee/Hip Exercises: Supine   Short Arc Quad Sets Strengthening;10 reps;3 sets  8#   Heel Slides AROM;Both;10 reps;1 set   Bridges AAROM;10 reps   Knee/Hip Exercises: Sidelying   Hip ABduction AROM;2 sets;10 reps;Both   Clams 10 reps with emphasis for core engagement    Other Sidelying Knee Exercises sidelying hip isometrics on each side    Shoulder Exercises: Supine   External Rotation Strengthening;10 reps;Both  External Rotation Weight (lbs) 3   Shoulder Exercises: Seated   Other Seated Exercises neck range of motion    Shoulder Exercises: Sidelying   ABduction Strengthening;10 reps  3#   Moist Heat Therapy   Number Minutes Moist Heat 20 Minutes   Moist Heat Location Shoulder   Manual Therapy   Passive ROM PROM within pain tolerance to both shoulders.                PT Education - 08/27/14 1547    Education provided Yes   Education Details side step at [employer], heel raisesx  2 reps only . diagonal backward  kicks, striving 90 seconds of activity.           Short Term Clinic Goals - 08/27/14 1543    CC Short Term Goal  #1   Title pt will report a decrease in pain by 25%   Status Deferred   CC Short Term Goal  #2   Title pt will be able to  perfomr 5 repetition of sit to stand in 30 seconds   Status Achieved   CC Short Term Goal  #3   Title pt will have a decrase in Quick Dash Scoure by 5 points   Status Achieved   CC Short Term Goal  #4   Title pt will be able to perform a basic exercise program at home   Status Achieved             [facility] Goals - 08/27/14 1544    [place] Term Goal  #1   Title pt will report decrease in pain by 50%   Status On-going   CC Long Term Goal  #2   Title pt will report increase in her ability to care for herself by 50%  still having touble with dressing with clothes over her head    Status On-going   CC Long Term Goal  #3   Title pt will be able to stand for 3 minutes inable to prepare a meal for herself at home   Status On-going   CC Long Term Goal  #4   Title pt will be independent in an advanced home program.(includes stretch, strenghen and endurance component)   Status On-going            Plan - 08/27/14 1538    Clinical Impression Statement pt was able to watk to the mat table at the far side of the ortho gym at steady pace without a rest break .  Rolled easier on mat table from side to side and needed less assist to come from supine to sit.  Overall slow, but steady progress   Clinical Impairments Affecting Rehab Potential longstanding deconditioning and mobility impairment   PT Next Visit Plan Continue to work out on ortho gym with wider mat and progress as able to standing free motion machine and other equipment     Pt felt better and moving easier after treatment   Problem List Patient Active Problem List   Diagnosis Date Noted  . Breast cancer of upper-outer quadrant of left female breast 01/24/2013  . Edema 01/09/2013  . Onychomycosis 01/09/2013  . Pain in joint, ankle and foot 01/09/2013   Donato Heinz. Owens Shark, PT  08/27/2014, 4:02 PM  [place], [place], [address] Phone:  [phone]   Fax:  [phone]

## 2014-08-27 NOTE — Patient Instructions (Signed)
Side step at kitchen counter.Marland KitchenMarland KitchenMarland KitchenKeep abdomen engaged Heel raise x 2 repetitions only !! (watch out for cramps)  Backward kick x 3 repetitions only

## 2014-08-29 ENCOUNTER — Ambulatory Visit: Payer: Medicare Other | Admitting: Physical Therapy

## 2014-09-02 ENCOUNTER — Telehealth: Payer: Self-pay | Admitting: Nurse Practitioner

## 2014-09-02 NOTE — Telephone Encounter (Signed)
pt cld to req a appt w/GM didnt want NP-gave pt r/s time & date

## 2014-09-02 NOTE — Telephone Encounter (Signed)
per pof to sch pt appt-gave pt copy of sch °

## 2014-09-03 ENCOUNTER — Encounter: Payer: BLUE CROSS/BLUE SHIELD | Admitting: Physical Therapy

## 2014-09-05 ENCOUNTER — Ambulatory Visit: Payer: Medicare Other | Admitting: Physical Therapy

## 2014-09-05 DIAGNOSIS — M25511 Pain in right shoulder: Secondary | ICD-10-CM | POA: Diagnosis not present

## 2014-09-05 DIAGNOSIS — M6281 Muscle weakness (generalized): Secondary | ICD-10-CM

## 2014-09-05 DIAGNOSIS — M24511 Contracture, right shoulder: Secondary | ICD-10-CM

## 2014-09-05 DIAGNOSIS — R296 Repeated falls: Secondary | ICD-10-CM

## 2014-09-05 DIAGNOSIS — M25411 Effusion, right shoulder: Secondary | ICD-10-CM

## 2014-09-05 NOTE — Therapy (Signed)
[place], [place], [address] Phone: [phone]   Fax:  [phone]  Physical Therapy Treatment  Patient Details  Name: [name] MRN: [id number] Date of Birth: 1941-03-12 Referring Provider:  Rolm Bookbinder, MD  Encounter Date: 09/05/2014      PT End of Session - 09/05/14 1704    Visit Number 18   Number of Visits 33   Date for PT Re-Evaluation 10/02/14   PT Start Time 4970   PT Stop Time 1600   PT Time Calculation (min) 45 min      Past Medical History  Diagnosis Date  . Hyperlipidemia   . Hypertension   . Cold     just started 01/02/13 cough  . Complication of anesthesia     very phobic about needles.last surgery had to sedate before doing iv  . Exertional shortness of breath   . Uterine cancer   . Chronic lower back pain   . Depression   . Arthritis     "joints; mostly on the right side" (01/11/2013)  . Breast cancer     "left" (01/11/2013)  . Cancer     endometrial  . Allergy   . Osteoporosis     knees   . Status post radiation therapy within last four weeks 03/19/13-05/03/13    lt breast 60.4GY    Past Surgical History  Procedure Laterality Date  . Replacement total knee Bilateral 2011-2012  . Foot tendon surgery Right   . Breast lumpectomy with needle localization and axillary sentinel lymph node bx Left 01/11/2013  . Breast biopsy Left 12/2012  . Abdominal hysterectomy  12/14/2012  . Dilation and curettage of uterus      "@ least one" (01/11/2013)  . Tubal ligation    . Cholecystectomy  1970  . Knee arthroscopy Right 1990's    "twice" (01/11/2013)  . Breast lumpectomy with needle localization and axillary sentinel lymph node bx Left 01/11/2013    Procedure: BREAST LUMPECTOMY WITH NEEDLE LOCALIZATION AND AXILLARY SENTINEL LYMPH NODE BX;  Surgeon: Rolm Bookbinder, MD;  Location: [facility];  Service: General;  Laterality: Left;    There were no vitals filed for this  visit.  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    Currently in Pain? Yes   Pain Score 3    Pain Location Neck   Pain Orientation Right;Left   Pain Radiating Towards neck toward shoulders                        [facility] Adult PT Treatment/Exercise - 09/05/14 0001    Elbow Exercises   Elbow Flexion Strengthening;10 reps;Supine  3# with 3 sets of 10    Elbow Extension Strengthening;10 reps  3# on left and right    Lumbar Exercises: Supine   Ab Set 10 reps   Clam 10 reps   Bent Knee Raise 10 reps   Other Supine Lumbar Exercises lower trunk rotation    Knee/Hip Exercises: Supine   Short Arc Quad Sets Strengthening;10 reps;3 sets;Both   Heel Slides  AROM;Both;10 reps;1 set   Bridges AAROM;10 reps   Knee/Hip Exercises: Sidelying   Hip ABduction AROM;2 sets;10 reps;Both   Clams 10 reps with emphasis for core engagement    Other Sidelying Knee Exercises sidelying hip isometrics on each side    Shoulder Exercises: Supine   External Rotation Strengthening;10 reps;Both   External Rotation Weight (lbs) 3   Shoulder Exercises: Seated   Other Seated Exercises neck range of motion    Shoulder Exercises: Sidelying   ABduction Strengthening;10 reps  3#   Moist Heat Therapy   Moist Heat Location Shoulder   Manual Therapy   Passive ROM PROM within pain tolerance to both shoulders.                   Short Term Clinic Goals - 08/27/14 1543    CC Short Term Goal  #1   Title pt will report a decrease in pain by 25%   Status Deferred   CC Short Term Goal  #2   Title pt will be able to perfomr 5 repetition of sit to stand  in 30 seconds   Status Achieved   CC Short Term Goal  #3   Title pt will have a decrase in Quick Dash Scoure by 5 points   Status Achieved   CC Short Term Goal  #4   Title pt will be able to perform a basic exercise program at home   Status Achieved             [facility] Goals - 09/05/14 1707    CC Long Term Goal  #1   Title pt will report decrease in pain by 50%   Status On-going   CC Long Term Goal  #2   Title pt will report increase in her ability to care for herself by 50%   Status On-going   CC Long Term Goal  #3   Title pt will be able to stand for 3 minutes inable to prepare a meal for herself at home   Status On-going   CC Long Term Goal  #4   Title pt will be independent in an advanced home program.(includes stretch, strenghen and endurance component)   Status On-going   CC Long Term Goal  #5   Title pt will have 125 degrees of right and left shoulder flexion for increased activities of daily living   Status On-going            Plan - 09/05/14 1705    Clinical Impression Statement Pt able to walk today, but was not able to lift as much weight with her legs and had difficulty with bed mobility today. She said she may need to go the the Dr. to get checked as she feel she does not have any "energy"  She continues to describe functional difficulties at home.   Clinical Impairments Affecting Rehab Potential longstanding deconditioning and mobility impairment   PT Next Visit Plan Continue to work out on ortho gym with wider mat.  progress as able to standing free motion machine, nustep and other equipment.        Problem List Patient Active Problem List   Diagnosis Date Noted  . Breast cancer of upper-outer quadrant of left female breast 01/24/2013  . Edema 01/09/2013  . Onychomycosis 01/09/2013  . Pain in joint, ankle and foot 01/09/2013   Donato Heinz. Owens Shark, PT  09/05/2014, 5:09 PM  [place]  [place], [place], [address] Phone: [phone]   Fax:  [phone]

## 2014-09-10 ENCOUNTER — Ambulatory Visit: Payer: Medicare Other | Admitting: Physical Therapy

## 2014-09-10 DIAGNOSIS — R296 Repeated falls: Secondary | ICD-10-CM

## 2014-09-10 DIAGNOSIS — M25511 Pain in right shoulder: Secondary | ICD-10-CM

## 2014-09-10 DIAGNOSIS — M24511 Contracture, right shoulder: Secondary | ICD-10-CM

## 2014-09-10 DIAGNOSIS — M6281 Muscle weakness (generalized): Secondary | ICD-10-CM

## 2014-09-10 DIAGNOSIS — M25411 Effusion, right shoulder: Secondary | ICD-10-CM

## 2014-09-10 NOTE — Therapy (Signed)
[place], [place], [address] Phone: [phone]   Fax:  [phone]  Physical Therapy Treatment  Patient Details  Name: [name] MRN: [id number] Date of Birth: 14-Sep-1940 Referring Provider:  Rolm Bookbinder, MD  Encounter Date: 09/10/2014      PT End of Session - 09/10/14 1710    Visit Number 9   Number of Visits 33   Date for PT Re-Evaluation 10/02/14   PT Start Time 1525   PT Stop Time 1603   PT Time Calculation (min) 38 min   Behavior During Therapy  [facility] for tasks assessed/performed      Past Medical History  Diagnosis Date  . Hyperlipidemia   . Hypertension   . Cold     just started 01/02/13 cough  . Complication of anesthesia     very phobic about needles.last surgery had to sedate before doing iv  . Exertional shortness of breath   . Uterine cancer   . Chronic lower back pain   . Depression   . Arthritis     "joints; mostly on the right side" (01/11/2013)  . Breast cancer     "left" (01/11/2013)  . Cancer     endometrial  . Allergy   . Osteoporosis     knees   . Status post radiation therapy within last four weeks 03/19/13-05/03/13    lt breast 60.4GY    Past Surgical History  Procedure Laterality Date  . Replacement total knee Bilateral 2011-2012  . Foot tendon surgery Right   . Breast lumpectomy with needle localization and axillary sentinel lymph node bx Left 01/11/2013  . Breast biopsy Left 12/2012  . Abdominal hysterectomy  12/14/2012  . Dilation and curettage of uterus      "@ least one" (01/11/2013)  . Tubal ligation    . Cholecystectomy  1970  . Knee arthroscopy Right 1990's    "twice" (01/11/2013)  . Breast lumpectomy with needle localization and axillary sentinel lymph node bx Left 01/11/2013    Procedure: BREAST LUMPECTOMY WITH NEEDLE LOCALIZATION AND AXILLARY SENTINEL LYMPH NODE BX;  Surgeon: Rolm Bookbinder, MD;  Location: [place];  Service: General;   Laterality: Left;    There were no vitals filed for this visit.  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    Currently in Pain? Yes   Pain Score 3    Pain Location Neck   Pain Orientation Right;Left   Pain Descriptors / Indicators Aching                         [facility] Adult PT Treatment/Exercise - 09/10/14 0001    Elbow Exercises   Elbow Flexion Strengthening;10 reps;Supine  3# with 3 sets of 10    Elbow Extension Strengthening;10 reps  3# on left and right    Lumbar Exercises: Seated   Other Seated Lumbar Exercises pelvic tilts/abd sets   Knee/Hip Exercises: Standing   Functional Squat 10 reps  from high mat   Knee/Hip Exercises: Supine   Short Arc Quad Sets Strengthening;10 reps;3 sets;Both  4#   Heel Slides AROM;Both;10 reps;1 set   Straight Leg Raises Both;10 reps   Other Supine  Knee Exercises bent knee raise    Moist Heat Therapy   Number Minutes Moist Heat 20 Minutes   Moist Heat Location Shoulder                   Short Term Clinic Goals - 08/27/14 1543    CC Short Term Goal  #1   Title pt will report a decrease in pain by 25%   Status Deferred   CC Short Term Goal  #2   Title pt will be able to perfomr 5 repetition of sit to stand in 30 seconds   Status Achieved   CC Short Term Goal  #3   Title pt will have a decrase in Quick Dash Scoure by 5 points   Status Achieved   CC Short Term Goal  #4   Title pt will be able to perform a basic exercise program at home   Status Achieved             [facility] Goals - 09/05/14 1707    CC Long Term Goal  #1   Title pt will report decrease in pain by 50%   Status On-going   CC Long Term Goal  #2   Title pt will  report increase in her ability to care for herself by 50%   Status On-going   CC Long Term Goal  #3   Title pt will be able to stand for 3 minutes inable to prepare a meal for herself at home   Status On-going   CC Long Term Goal  #4   Title pt will be independent in an advanced home program.(includes stretch, strenghen and endurance component)   Status On-going   CC Long Term Goal  #5   Title pt will have 125 degrees of right and left shoulder flexion for increased activities of daily living   Status On-going            Plan - 09/10/14 1710    Clinical Impression Statement pt appears to be stronger, able to stand up straigher and less bothered by pain today.  She talked about continueing her aquatic therapy at the [facility] participattion and willingness to work more    PT Next Visit Plan continue with exericse progression GCode next         Problem List Patient Active Problem List   Diagnosis Date Noted  . Breast cancer of upper-outer quadrant of left female breast 01/24/2013  . Edema 01/09/2013  . Onychomycosis 01/09/2013  . Pain in joint, ankle and foot 01/09/2013   Donato Heinz. Owens Shark, PT    09/10/2014, 5:15 PM  [place], [place], [address] Phone: [phone]   Fax:  [phone]

## 2014-09-12 ENCOUNTER — Encounter: Payer: BLUE CROSS/BLUE SHIELD | Admitting: Physical Therapy

## 2014-09-17 ENCOUNTER — Ambulatory Visit: Payer: Medicare Other | Admitting: Physical Therapy

## 2014-09-17 DIAGNOSIS — M24511 Contracture, right shoulder: Secondary | ICD-10-CM

## 2014-09-17 DIAGNOSIS — M25511 Pain in right shoulder: Secondary | ICD-10-CM

## 2014-09-17 DIAGNOSIS — R296 Repeated falls: Secondary | ICD-10-CM

## 2014-09-17 DIAGNOSIS — M25411 Effusion, right shoulder: Secondary | ICD-10-CM

## 2014-09-17 DIAGNOSIS — M6281 Muscle weakness (generalized): Secondary | ICD-10-CM

## 2014-09-17 NOTE — Therapy (Signed)
[place], [place], [address] Phone: [phone]   Fax:  [phone]  Physical Therapy Treatment  Patient Details  Name: [name] MRN: [id number] Date of Birth: 15-Jun-1940 Referring Provider:  Willey Blade, MD  Encounter Date: 09/17/2014      PT End of Session - 09/17/14 1723    Visit Number 10   Number of Visits 33   Date for PT Re-Evaluation 10/02/14   PT Start Time 5498   PT Stop Time 1603   PT Time Calculation (min) 48 min      Past Medical History  Diagnosis Date  . Hyperlipidemia   . Hypertension   . Cold     just started 01/02/13 cough  . Complication of anesthesia     very phobic about needles.last surgery had to sedate before doing iv  . Exertional shortness of breath   . Uterine cancer   . Chronic lower back pain   . Depression   . Arthritis     "joints; mostly on the right side" (01/11/2013)  . Breast cancer     "left" (01/11/2013)  . Cancer     endometrial  . Allergy   . Osteoporosis     knees   . Status post radiation therapy within last four weeks 03/19/13-05/03/13    lt breast 60.4GY    Past Surgical History  Procedure Laterality Date  . Replacement total knee Bilateral 2011-2012  . Foot tendon surgery Right   . Breast lumpectomy with needle localization and axillary sentinel lymph node bx Left 01/11/2013  . Breast biopsy Left 12/2012  . Abdominal hysterectomy  12/14/2012  . Dilation and curettage of uterus      "@ least one" (01/11/2013)  . Tubal ligation    . Cholecystectomy  1970  . Knee arthroscopy Right 1990's    "twice" (01/11/2013)  . Breast lumpectomy with needle localization and axillary sentinel lymph node bx Left 01/11/2013    Procedure: BREAST LUMPECTOMY WITH NEEDLE LOCALIZATION AND AXILLARY SENTINEL LYMPH NODE BX;  Surgeon: Rolm Bookbinder, MD;  Location: [facility];  Service: General;  Laterality: Left;    There were no vitals filed for this  visit.  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 She said she had to wear pajama pants today because they were the only ones she could pull up as she couldn't internally rotate her left arm enough to pull her pants up   Currently in Pain? Yes   Pain Score 6    Pain Location Neck   Pain Orientation Right;Left                         [facility] Adult PT Treatment/Exercise - 09/17/14 0001    Elbow Exercises   Elbow Flexion Strengthening;10 reps;Supine  2#   Elbow Extension Theraband;Strengthening   Theraband Level (Elbow Extension) Level 3 (Green)   Lumbar Exercises: Standing   Other Standing Lumbar Exercises trunk extension   Knee/Hip Exercises: Standing   Other Standing Knee Exercises soleus stretch with sustained forward stetch with controlled return    Knee/Hip Exercises: Seated   Long Arc Quad Strengthening;15 reps;Weights  8#  Heel Slides AAROM;1 set;10 reps   Knee/Hip Exercises: Supine   Short Arc Quad Sets Strengthening;10 reps;3 sets;Both  8#   Heel Slides AROM;Both;10 reps;1 set   Hip Adduction Isometric Both;AAROM  bolster in between knees   Other Supine Knee Exercises isometric hip abductions.    Shoulder Exercises: Supine   External Rotation Strengthening;Theraband   Theraband Level (Shoulder External Rotation) Level 3 (Green)   Flexion 10 reps  pulling out green theraband raising arms below shoulder leve   Theraband Level (Shoulder Flexion) Level 3 (Green)   Modalities   Modalities Moist Heat   Moist Heat Therapy   Number Minutes Moist Heat 20 Minutes   Moist Heat Location Shoulder                   Short Term Clinic Goals - 08/27/14 1543    CC Short Term Goal  #1   Title pt will  report a decrease in pain by 25%   Status Deferred   CC Short Term Goal  #2   Title pt will be able to perfomr 5 repetition of sit to stand in 30 seconds   Status Achieved   CC Short Term Goal  #3   Title pt will have a decrase in Quick Dash Scoure by 5 points   Status Achieved   CC Short Term Goal  #4   Title pt will be able to perform a basic exercise program at home   Status Achieved             [facility] Goals - 10/04/2014 1725    CC Long Term Goal  #1   Title pt will report decrease in pain by 50%   Status On-going   CC Long Term Goal  #2   Title pt will report increase in her ability to care for herself by 50%   Status On-going   CC Long Term Goal  #3   Title pt will be able to stand for 3 minutes inable to prepare a meal for herself at home   Status On-going   CC Long Term Goal  #4   Title pt will be independent in an advanced home program.(includes stretch, strenghen and endurance component)   Status On-going   CC Long Term Goal  #5   Title pt will have 125 degrees of right and left shoulder flexion for increased activities of daily living   Status On-going            Plan - Oct 04, 2014 1723    Clinical Impression Statement pt having a  difficult day today but says she always feels better after she comes here and exercises. she is considering talking to her MD about a a rheumatologist consult and/or possible a home health aide   PT Next Visit Plan continue with exericse progression           G-Codes - 04-Oct-2014 1725    Functional Assessment Tool Used clincial judgement   Functional Limitation Other PT primary   Other PT Primary Current Status (I9485) At least 60 percent but less than 80 percent impaired, limited or restricted   Other PT Primary Goal Status (I6270) At least 40 percent but less than 60 percent impaired, limited or restricted      Problem List Patient Active Problem List   Diagnosis Date Noted  . Breast cancer of upper-outer quadrant  of left female breast 01/24/2013  . Edema 01/09/2013  . Onychomycosis 01/09/2013  . Pain  in joint, ankle and foot 01/09/2013   Donato Heinz. Owens Shark, PT    09/17/2014, 5:26 PM  [place], [place], [address] Phone: [phone]   Fax:  [phone]

## 2014-09-18 ENCOUNTER — Other Ambulatory Visit: Payer: BLUE CROSS/BLUE SHIELD

## 2014-09-18 ENCOUNTER — Ambulatory Visit: Payer: Self-pay | Admitting: Nurse Practitioner

## 2014-09-19 ENCOUNTER — Ambulatory Visit: Payer: Medicare Other | Admitting: Physical Therapy

## 2014-09-19 DIAGNOSIS — M24511 Contracture, right shoulder: Secondary | ICD-10-CM

## 2014-09-19 DIAGNOSIS — M25511 Pain in right shoulder: Secondary | ICD-10-CM | POA: Diagnosis not present

## 2014-09-19 DIAGNOSIS — M6281 Muscle weakness (generalized): Secondary | ICD-10-CM

## 2014-09-19 DIAGNOSIS — R296 Repeated falls: Secondary | ICD-10-CM

## 2014-09-19 DIAGNOSIS — M25411 Effusion, right shoulder: Secondary | ICD-10-CM

## 2014-09-19 NOTE — Therapy (Signed)
[place], [place], [address] Phone: [phone]   Fax:  [phone]  Physical Therapy Treatment  Patient Details  Name: [name] MRN: [id number] Date of Birth: 07/03/1940 Referring Provider:  Willey Blade, MD  Encounter Date: 09/19/2014      PT End of Session - 09/19/14 1700    Visit Number 11   Number of Visits 33   Date for PT Re-Evaluation 10/02/14   PT Start Time 3846   PT Stop Time 1600   PT Time Calculation (min) 42 min      Past Medical History  Diagnosis Date  . Hyperlipidemia   . Hypertension   . Cold     just started 01/02/13 cough  . Complication of anesthesia     very phobic about needles.last surgery had to sedate before doing iv  . Exertional shortness of breath   . Uterine cancer   . Chronic lower back pain   . Depression   . Arthritis     "joints; mostly on the right side" (01/11/2013)  . Breast cancer     "left" (01/11/2013)  . Cancer     endometrial  . Allergy   . Osteoporosis     knees   . Status post radiation therapy within last four weeks 03/19/13-05/03/13    lt breast 60.4GY    Past Surgical History  Procedure Laterality Date  . Replacement total knee Bilateral 2011-2012  . Foot tendon surgery Right   . Breast lumpectomy with needle localization and axillary sentinel lymph node bx Left 01/11/2013  . Breast biopsy Left 12/2012  . Abdominal hysterectomy  12/14/2012  . Dilation and curettage of uterus      "@ least one" (01/11/2013)  . Tubal ligation    . Cholecystectomy  1970  . Knee arthroscopy Right 1990's    "twice" (01/11/2013)  . Breast lumpectomy with needle localization and axillary sentinel lymph node bx Left 01/11/2013    Procedure: BREAST LUMPECTOMY WITH NEEDLE LOCALIZATION AND AXILLARY SENTINEL LYMPH NODE BX;  Surgeon: Rolm Bookbinder, MD;  Location: [facility];  Service: General;  Laterality: Left;    There were no vitals filed for this  visit.  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    Pertinent History breast cancer and treatment, arthritis, got hit in the back of head with a gymnastic ring and has had pain in back since.    Currently in Pain? Yes   Pain Score 4    Pain Location Neck   Pain Orientation Right;Left   Pain Descriptors / Indicators Aching   Pain Type Chronic pain   Pain Onset More than a month ago                         [facility] Adult PT Treatment/Exercise - 09/19/14 0001    Elbow Exercises   Elbow Flexion Strengthening;Supine;20 reps  3#   Elbow Extension Theraband;Strengthening   Theraband Level (Elbow Extension) Level 3 (Green)   Lumbar Exercises: Standing   Heel Raises 5 reps   Other Standing Lumbar Exercises trunk extension   Other Standing Lumbar Exercises opposite hip extesion and arm slide on countertop    Lumbar Exercises: Supine   Ab Set 5  reps   Glut Set 5 reps   Bent Knee Raise 10 reps   Bridge 5 reps   Knee/Hip Exercises: Standing   Forward Lunges Both;5 reps  trunk activation and balance   Other Standing Knee Exercises standing hip abduction    Knee/Hip Exercises: Supine   Short Arc Quad Sets Strengthening;10 reps;3 sets;Both  8#   Heel Slides AROM;Both;10 reps;1 set   Hip Adduction Isometric Both;AAROM  bolster in between knees   Other Supine Knee Exercises isometric hip abductions.    Shoulder Exercises: Supine   Flexion 10 reps;Strengthening;Weights   Shoulder Flexion Weight (lbs) 3   Moist Heat Therapy   Number Minutes Moist Heat 20 Minutes   Moist Heat Location Shoulder  left and neck                    Short Term Clinic Goals - 08/27/14 1543    CC Short Term Goal  #1   Title pt will report a decrease in pain by 25%   Status Deferred    CC Short Term Goal  #2   Title pt will be able to perfomr 5 repetition of sit to stand in 30 seconds   Status Achieved   CC Short Term Goal  #3   Title pt will have a decrase in Quick Dash Scoure by 5 points   Status Achieved   CC Short Term Goal  #4   Title pt will be able to perform a basic exercise program at home   Status Achieved             [facility] Goals - 09/17/14 1725    CC Long Term Goal  #1   Title pt will report decrease in pain by 50%   Status On-going   CC Long Term Goal  #2   Title pt will report increase in her ability to care for herself by 50%   Status On-going   CC Long Term Goal  #3   Title pt will be able to stand for 3 minutes inable to prepare a meal for herself at home   Status On-going   CC Long Term Goal  #4   Title pt will be independent in an advanced home program.(includes stretch, strenghen and endurance component)   Status On-going   CC Long Term Goal  #5   Title pt will have 125 degrees of right and left shoulder flexion for increased activities of daily living   Status On-going            Plan - 09/19/14 1542    Clinical Impression Statement good participation with exercise today.  Ready to increase to weights needs to work on hip flexor stretching    Clinical Impairments Affecting Rehab Potential longstanding deconditioning and mobility impairment   PT Next Visit Plan consider advancing to hip and knee extension machine and nustep next time. Try standing free motion for core strengh. Try different ways to stretch hip flexors         Problem List Patient Active Problem List   Diagnosis Date Noted  . Breast cancer of upper-outer quadrant of left female breast 01/24/2013  . Edema 01/09/2013  . Onychomycosis 01/09/2013  . Pain in joint, ankle and foot 01/09/2013   Donato Heinz. Owens Shark, PT  09/19/2014, 5:01 PM  [place], [place],  [address] Phone: [phone]   Fax:  [phone]

## 2014-09-24 ENCOUNTER — Ambulatory Visit: Payer: Medicare Other | Admitting: Physical Therapy

## 2014-09-26 ENCOUNTER — Ambulatory Visit: Payer: Medicare Other | Attending: General Surgery | Admitting: Physical Therapy

## 2014-09-26 DIAGNOSIS — R296 Repeated falls: Secondary | ICD-10-CM | POA: Insufficient documentation

## 2014-09-26 DIAGNOSIS — M24511 Contracture, right shoulder: Secondary | ICD-10-CM

## 2014-09-26 DIAGNOSIS — M25411 Effusion, right shoulder: Secondary | ICD-10-CM | POA: Diagnosis not present

## 2014-09-26 DIAGNOSIS — M6281 Muscle weakness (generalized): Secondary | ICD-10-CM | POA: Diagnosis not present

## 2014-09-26 DIAGNOSIS — M25511 Pain in right shoulder: Secondary | ICD-10-CM | POA: Insufficient documentation

## 2014-09-26 NOTE — Therapy (Signed)
[place], [place], [address] Phone: [phone]   Fax:  [phone]  Physical Therapy Treatment  Patient Details  Name: [name] MRN: [id number] Date of Birth: January 24, 1941 Referring Provider:  Willey Blade, MD  Encounter Date: 09/26/2014      PT End of Session - 09/26/14 1705    Visit Number 12   Number of Visits 33   Date for PT Re-Evaluation 10/02/14   PT Start Time 6270   PT Stop Time 1600   PT Time Calculation (min) 45 min      Past Medical History  Diagnosis Date  . Hyperlipidemia   . Hypertension   . Cold     just started 01/02/13 cough  . Complication of anesthesia     very phobic about needles.last surgery had to sedate before doing iv  . Exertional shortness of breath   . Uterine cancer   . Chronic lower back pain   . Depression   . Arthritis     "joints; mostly on the right side" (01/11/2013)  . Breast cancer     "left" (01/11/2013)  . Cancer     endometrial  . Allergy   . Osteoporosis     knees   . Status post radiation therapy within last four weeks 03/19/13-05/03/13    lt breast 60.4GY    Past Surgical History  Procedure Laterality Date  . Replacement total knee Bilateral 2011-2012  . Foot tendon surgery Right   . Breast lumpectomy with needle localization and axillary sentinel lymph node bx Left 01/11/2013  . Breast biopsy Left 12/2012  . Abdominal hysterectomy  12/14/2012  . Dilation and curettage of uterus      "@ least one" (01/11/2013)  . Tubal ligation    . Cholecystectomy  1970  . Knee arthroscopy Right 1990's    "twice" (01/11/2013)  . Breast lumpectomy with needle localization and axillary sentinel lymph node bx Left 01/11/2013    Procedure: BREAST LUMPECTOMY WITH NEEDLE LOCALIZATION AND AXILLARY SENTINEL LYMPH NODE BX;  Surgeon: Rolm Bookbinder, MD;  Location: [facility];  Service: General;  Laterality: Left;    There were no vitals filed for this  visit.  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 Yes   Pain Score 4    Pain Location Neck   Pain Orientation Left   Pain Descriptors / Indicators Aching   Pain Type Chronic pain   Pain Radiating Towards neck towards left shoulder and arm   Pain Onset More than a month ago   Multiple Pain Sites Yes                         [facility] Adult PT Treatment/Exercise - 09/26/14 0001    Lumbar Exercises: Standing   Heel Raises 5 reps   Knee/Hip Exercises: Standing   Knee Flexion Both;5 reps   Functional Squat 10 reps  from high mat   Other Standing Knee Exercises  5 reps    Knee/Hip  Exercises: Supine   Short Arc Quad Sets Strengthening;10 reps;Both;2 sets  9#   Bridges AROM;10 reps   Shoulder Exercises: Supine   External Rotation Strengthening;Theraband   Theraband Level (Shoulder External Rotation) Level 4 (Blue)   Flexion 10 reps  pulling out green theraband raising arms below shoulder leve   Theraband Level (Shoulder Flexion) Level 3 (Green);Level 4 (Blue)   Other Supine Exercises blue  theraband "draw the sword"    Shoulder Exercises: Seated   Row Right;Left;10 reps;Theraband   Theraband Level (Shoulder Row) Level 4 (Blue)                PT Education - 09/26/14 1704    Education provided Yes   Education Details theraband supine subscapular exericse with blue theraband and standing 4 way hip exercise   Person(s) Educated Patient   Methods Explanation;Demonstration;Verbal cues;Handout   Comprehension Verbalized understanding;Returned  demonstration;Verbal cues required           Short Term Clinic Goals - 09/26/14 1706    CC Short Term Goal  #1   Title pt will report a decrease in pain by 25%   Status Deferred   CC Short Term Goal  #2   Title pt will be able to perfomr 5 repetition of sit to stand in 30 seconds   Status Achieved   CC Short Term Goal  #3   Title pt will have a decrase in Quick Dash Scoure by 5 points   Status Achieved   CC Short Term Goal  #4   Title pt will be able to perform a basic exercise program at home   Status Achieved             [facility] Goals - 09/26/14 1707    CC Long Term Goal  #1   Title pt will report decrease in pain by 50%   Status On-going   CC Long Term Goal  #2   Title pt will report increase in her ability to care for herself by 50%   Status On-going   CC Long Term Goal  #3   Title pt will be able to stand for 3 minutes inable to prepare a meal for herself at home   Status On-going   CC Long Term Goal  #4   Title pt will be independent in an advanced home program.(includes stretch, strenghen and endurance component)   Status On-going   CC Long Term Goal  #5   Title pt will have 125 degrees of right and left shoulder flexion for increased activities of daily living   Status On-going            Plan - 09/26/14 1705    Clinical Impression Statement Pt with new symptom of right hand swelling due to ?  She did well with all exercise today with upgraded program. Continues to put forth good effort   Clinical Impairments Affecting Rehab Potential longstanding deconditioning and mobility impairment   PT Next Visit Plan consider advancing to hip and knee extension machine and nustep next time. Try standing free motion for core strengh. Try different ways to stretch hip flexors         Problem List Patient Active Problem List   Diagnosis Date Noted  . Breast cancer of upper-outer quadrant of left female breast 01/24/2013  . Edema 01/09/2013  .  Onychomycosis 01/09/2013  . Pain in joint, ankle and foot 01/09/2013   Donato Heinz. Owens Shark, PT    09/26/2014,  5:08 PM  [place], [place], [address] Phone: [phone]   Fax:  [phone]

## 2014-09-26 NOTE — Patient Instructions (Addendum)
Knee High   Holding stable object, raise knee to hip level, then lower knee. Repeat with other knee. Complete __10_ repetitions. Do __2__ sessions per day.  ABDUCTION: Standing (Active)   Stand, feet flat. Lift right leg out to side. Use _0__ lbs. Complete __10_ repetitions. Perform __2_ sessions per day.  ADDUCTION: Standing - Stable (Active)   Stand, right leg out to side as far as possible. Draw leg in across midline. Use _0__ lbs. Complete 10_ repetitions. Perform _2__ sessions per day.       EXTENSION: Standing (Active)  Stand, both feet flat. Draw right leg behind body as far as possible. Use 0___ lbs. Complete 10 repetitions. Perform __2_ sessions per day.  Copyright  VHI. All rights reserved.    Over Head Pull: Narrow Grip     [place] [phone]   On back, knees bent, feet flat, band across thighs, elbows straight but relaxed. Pull hands apart (start). Keeping elbows straight, bring arms up and over head, hands toward floor. Keep pull steady on band. Hold momentarily. Return slowly, keeping pull steady, back to start. Repeat ___ times. Band color ______   Side Pull: Double Arm   On back, knees bent, feet flat. Arms perpendicular to body, shoulder level, elbows straight but relaxed. Pull arms out to sides, elbows straight. Resistance band comes across collarbones, hands toward floor. Hold momentarily. Slowly return to starting position. Repeat ___ times. Band color _____   Sash   On back, knees bent, feet flat, left hand on left hip, right hand above left. Pull right arm DIAGONALLY (hip to shoulder) across chest. Bring right arm along head toward floor. Hold momentarily. Slowly return to starting position. Repeat ___ times. Do with left arm. Band color ______   Shoulder Rotation: Double Arm   On back, knees bent, feet flat, elbows tucked at sides, bent 90, hands palms up. Pull hands apart and down toward floor, keeping elbows near sides. Hold momentarily.  Slowly return to starting position. Repeat ___ times. Band color ______

## 2014-10-01 ENCOUNTER — Ambulatory Visit: Payer: Medicare Other | Admitting: Physical Therapy

## 2014-10-03 ENCOUNTER — Ambulatory Visit: Payer: Medicare Other | Admitting: Physical Therapy

## 2014-10-03 DIAGNOSIS — R296 Repeated falls: Secondary | ICD-10-CM

## 2014-10-03 DIAGNOSIS — M25511 Pain in right shoulder: Secondary | ICD-10-CM

## 2014-10-03 DIAGNOSIS — M25411 Effusion, right shoulder: Secondary | ICD-10-CM

## 2014-10-03 DIAGNOSIS — M24511 Contracture, right shoulder: Secondary | ICD-10-CM

## 2014-10-03 DIAGNOSIS — M6281 Muscle weakness (generalized): Secondary | ICD-10-CM

## 2014-10-03 NOTE — Therapy (Signed)
[place], [place], [address] Phone: [phone]   Fax:  [phone]  Physical Therapy Treatment  Patient Details  Name: [name] MRN: [id number] Date of Birth: [birth year] Referring Provider:  Rolm Bookbinder, MD  Encounter Date: 10/03/2014      PT End of Session - 10/03/14 1732    Visit Number 13   Number of Visits 33   Date for PT Re-Evaluation 12/03/14   PT Start Time 1520   PT Stop Time 1605   PT Time Calculation (min) 45 min      Past Medical History  Diagnosis Date  . Hyperlipidemia   . Hypertension   . Cold     just started 01/02/13 cough  . Complication of anesthesia     very phobic about needles.last surgery had to sedate before doing iv  . Exertional shortness of breath   . Uterine cancer   . Chronic lower back pain   . Depression   . Arthritis     "joints; mostly on the right side" (01/11/2013)  . Breast cancer     "left" (01/11/2013)  . Cancer     endometrial  . Allergy   . Osteoporosis     knees   . Status post radiation therapy within last four weeks 03/19/13-05/03/13    lt breast 60.4GY    Past Surgical History  Procedure Laterality Date  . Replacement total knee Bilateral 2011-2012  . Foot tendon surgery Right   . Breast lumpectomy with needle localization and axillary sentinel lymph node bx Left 01/11/2013  . Breast biopsy Left 12/2012  . Abdominal hysterectomy  12/14/2012  . Dilation and curettage of uterus      "@ least one" (01/11/2013)  . Tubal ligation    . Cholecystectomy  1970  . Knee arthroscopy Right 1990's    "twice" (01/11/2013)  . Breast lumpectomy with needle localization and axillary sentinel lymph node bx Left 01/11/2013    Procedure: BREAST LUMPECTOMY WITH NEEDLE LOCALIZATION AND AXILLARY SENTINEL LYMPH NODE BX;  Surgeon: Rolm Bookbinder, MD;  Location: [facility];  Service: General;  Laterality: Left;    There were no vitals filed for this  visit.  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    Currently in Pain? Yes   Pain Score 5    Pain Location Neck   Pain Orientation Right   Pain Descriptors / Indicators Aching   Pain Radiating Towards top of neck toward top of shoulder                          [facility] Adult PT Treatment/Exercise - 10/03/14 0001    Ambulation/Gait   Ambulation/Gait Yes   Gait Comments walking with 4# weights on each leg. high stepping   Lumbar Exercises: Seated   Long Arc Quad on Chair Strengthening;Both;10 reps  4#   Lumbar Exercises: Supine   Glut Set 5 reps   Bent Knee Raise 10 reps   Other Supine Lumbar Exercises sitting up tall with both hands on  dowel rod 'serve the tray" and "one side up, one side down'   Knee/Hip Exercises: Standing   Forward Lunges Both;5 reps  trunk activation and balance with opposite arm raise    Functional Squat 10 reps  from high mat   Knee/Hip Exercises: Seated   Other Seated Knee Exercises marches 10 reps with 4# on each leg.   Knee/Hip Exercises: Supine   Short Arc Quad Sets Strengthening;10 reps;Both;2 sets  10#   Heel Slides AROM;Both;10 reps;1 set  4#   Shoulder Exercises: Seated   Other Seated Exercises closed chain pushing on big ball with 5 circles in each direction   Shoulder Exercises: Pulleys   Flexion 2 minutes  standing with back against wall    Moist Heat Therapy   Number Minutes Moist Heat 20 Minutes   Moist Heat Location Cervical;Shoulder                   Short Term Clinic Goals - 10/03/14 1737    CC Short Term Goal  #1   Title pt will report a decrease  in pain by 25%   Status Deferred   CC Short Term Goal  #2   Title pt will be able to perfomr 5 repetition of sit to stand in 30 seconds   Status Achieved   CC Short Term Goal  #3   Title pt will have a decrase in Quick Dash Scoure by 5 points   Status Achieved   CC Short Term Goal  #4   Title pt will be able to perform a basic exercise program at home   Status Achieved             [facility] Goals - 10/03/14 1738    CC Long Term Goal  #1   Title pt will report decrease in pain by 50%   Status On-going   CC Long Term Goal  #2   Title pt will report increase in her ability to care for herself by 50%   Status On-going   CC Long Term Goal  #3   Title pt will be able to stand for 3 minutes inable to prepare a meal for herself at home   Status On-going   CC Long Term Goal  #4   Title pt will be independent in an advanced home program.(includes stretch, strenghen and endurance component)   Status On-going   CC Long Term Goal  #5   Title pt will have 125 degrees of right and left shoulder flexion for increased activities of daily living   Status On-going            Plan - 10/03/14 1735    Clinical Impression Statement [name] continues to work hard with exercise and has improved in the amount of resistance she can lift with her legs and ease of sit to stand and ambulation.  She still has pain and decrease use of her upper extremties that impairs her functional status. She would benefit from visits of occupational therapy to see if there are adaptations she can make for dressing and other ADLS She has not yet acheived her long term goals and will require continued skilled therapy    Pt will benefit from skilled therapeutic intervention in order to improve on the following deficits Abnormal gait;Decreased strength;Pain;Difficulty walking;Decreased activity tolerance;Decreased mobility;Impaired UE functional use;Decreased range of motion;Decreased balance;Decreased safety  awareness;Postural dysfunction;Decreased endurance;Obesity   Rehab Potential Good   Clinical Impairments Affecting Rehab Potential longstanding  deconditioning and mobility impairment   PT Frequency 2x / week   PT Duration 8 weeks   PT Treatment/Interventions Therapeutic exercise;Balance training;Passive range of motion;Neuromuscular re-education;DME Instruction;Patient/family education;Gait training;Moist Heat;Manual techniques;Functional mobility training;Therapeutic activities   PT Next Visit Plan continue upgrade of exercise program to more resistance machines.    Recommended Other Services Occuapational Therapy Evaluation   Consulted and Agree with Plan of Care Patient        Problem List Patient Active Problem List   Diagnosis Date Noted  . Breast cancer of upper-outer quadrant of left female breast 01/24/2013  . Edema 01/09/2013  . Onychomycosis 01/09/2013  . Pain in joint, ankle and foot 01/09/2013   Donato Heinz. Owens Shark, PT  10/03/2014, 5:41 PM  [place], [place], [address] Phone: [phone]   Fax:  [phone]

## 2014-10-08 ENCOUNTER — Ambulatory Visit: Payer: Medicare Other | Admitting: Physical Therapy

## 2014-10-08 DIAGNOSIS — M24511 Contracture, right shoulder: Secondary | ICD-10-CM

## 2014-10-08 DIAGNOSIS — M25411 Effusion, right shoulder: Secondary | ICD-10-CM

## 2014-10-08 DIAGNOSIS — M25511 Pain in right shoulder: Secondary | ICD-10-CM | POA: Diagnosis not present

## 2014-10-08 DIAGNOSIS — R296 Repeated falls: Secondary | ICD-10-CM

## 2014-10-08 DIAGNOSIS — M6281 Muscle weakness (generalized): Secondary | ICD-10-CM

## 2014-10-08 NOTE — Therapy (Signed)
[place], [place], [address] Phone: [phone]   Fax:  [phone]  Physical Therapy Treatment  Patient Details  Name: [name] MRN: [id number] Date of Birth: 1940/06/04 Referring Provider:  Rolm Bookbinder, MD  Encounter Date: 10/08/2014      PT End of Session - 10/08/14 1721    Visit Number 14   Number of Visits 33   Date for PT Re-Evaluation 12/03/14   PT Start Time 4401   PT Stop Time 1600   PT Time Calculation (min) 45 min      Past Medical History  Diagnosis Date  . Hyperlipidemia   . Hypertension   . Cold     just started 01/02/13 cough  . Complication of anesthesia     very phobic about needles.last surgery had to sedate before doing iv  . Exertional shortness of breath   . Uterine cancer   . Chronic lower back pain   . Depression   . Arthritis     "joints; mostly on the right side" (01/11/2013)  . Breast cancer     "left" (01/11/2013)  . Cancer     endometrial  . Allergy   . Osteoporosis     knees   . Status post radiation therapy within last four weeks 03/19/13-05/03/13    lt breast 60.4GY    Past Surgical History  Procedure Laterality Date  . Replacement total knee Bilateral 2011-2012  . Foot tendon surgery Right   . Breast lumpectomy with needle localization and axillary sentinel lymph node bx Left 01/11/2013  . Breast biopsy Left 12/2012  . Abdominal hysterectomy  12/14/2012  . Dilation and curettage of uterus      "@ least one" (01/11/2013)  . Tubal ligation    . Cholecystectomy  1970  . Knee arthroscopy Right 1990's    "twice" (01/11/2013)  . Breast lumpectomy with needle localization and axillary sentinel lymph node bx Left 01/11/2013    Procedure: BREAST LUMPECTOMY WITH NEEDLE LOCALIZATION AND AXILLARY SENTINEL LYMPH NODE BX;  Surgeon: Rolm Bookbinder, MD;  Location: [facility];  Service: General;  Laterality: Left;    There were no vitals filed for this  visit.  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    Patient Stated Goals to get back into water aerbics   Currently in Pain? Yes   Pain Score 4    Pain Location Neck  and righ knee likely due to rain   Pain Descriptors / Indicators Aching   Pain Type Chronic pain   Pain Radiating Towards neck toward top of shoulder    Pain Onset More than a month ago                         [facility] Adult PT Treatment/Exercise - 10/08/14 0001    Bed Mobility   Rolling Right 5: Set up   Rolling Right Details (indicate cue type and reason) pt needs extra time and several  attempts to overcome feel of falling off mat   Rolling Left 5: Supervision   Rolling Left Details (indicate cue type and reason) needs extra time   Right Sidelying to Sit 4: Min assist   Right Sidelying to Sit Details (indicate cue type and reason) pt pulls up on my arm to come to sitting   Elbow Exercises   Elbow Extension Theraband;10 reps;Strengthening   Theraband Level (Elbow Extension) Level 4 (Blue)   Lumbar Exercises: Seated   Other Seated Lumbar Exercises rows with blue theraband    Knee/Hip Exercises: Standing   Knee Flexion Both;5 reps;2 sets  6 pounds on each leg    Other Standing Knee Exercises marching  x ~ 50 feet iwth 6 pounds on each leg    Knee/Hip Exercises: Seated   Other Seated Knee Exercises marches 10 reps with 6# on each leg.   Knee/Hip Exercises: Sidelying   Hip ABduction AROM;2 sets;10 reps;Both   Clams 10 reps with emphasis for core engagement    Knee/Hip  Exercises: Prone   Other Prone Exercises knee flexion 10 reps on each leg    Shoulder Exercises: Seated   Other Seated Exercises closed chain pushing on big ball with circles in each direction for about 5 minutes.    Other Seated Exercises ue ranger with each arm side to side and forward and back   Shoulder Exercises: Standing   Other Standing Exercises free motion machine elbow extemsion with 7 pojn                   Short Term Clinic Goals - 10/03/14 1737    CC Short Term Goal  #1   Title pt will report a decrease in pain by 25%   Status Deferred   CC Short Term Goal  #2   Title pt will be able to perfomr 5 repetition of sit to stand in 30 seconds   Status Achieved   CC Short Term Goal  #3   Title pt will have a decrase in Quick Dash Scoure by 5 points   Status Achieved   CC Short Term Goal  #4   Title pt will be able to perform a basic exercise program at home   Status Achieved             [facility] Goals - 10/03/14 1738    CC Long Term Goal  #1   Title pt will report decrease in pain by 50%   Status On-going   CC Long Term Goal  #2   Title pt will report increase in her ability to care for herself by 50%   Status On-going   CC Long Term Goal  #3   Title pt will be able to stand for 3 minutes inable to prepare a meal for herself at home   Status On-going   CC Long Term Goal  #4   Title pt will be independent in an advanced home program.(includes stretch, strenghen and endurance component)   Status On-going   CC Long Term Goal  #5   Title pt will have 125 degrees of right and left shoulder flexion for increased activities of daily living   Status On-going            Plan - 10/08/14 1721    Clinical Impression Statement Pt feeling bettter and was able to advance functional moiblity, especially in rolling. She required alot of body control and overcoming fear to perfrom this. Pt appears  more confidnt in mobility   PT Next Visit Plan continue  upgrade of exercise program to more resistance machines.and exercise in standing with elements of rotation         Problem List Patient Active Problem List   Diagnosis Date Noted  . Breast cancer of upper-outer quadrant of left female breast 01/24/2013  . Edema 01/09/2013  . Onychomycosis 01/09/2013  . Pain in joint, ankle and foot 01/09/2013   Donato Heinz. Owens Shark, PT   10/08/2014, 5:24 PM  [place], [place], [address] Phone: [phone]   Fax:  [phone]

## 2014-10-09 ENCOUNTER — Ambulatory Visit (INDEPENDENT_AMBULATORY_CARE_PROVIDER_SITE_OTHER): Payer: Medicare Other | Admitting: Podiatry

## 2014-10-09 ENCOUNTER — Encounter: Payer: Self-pay | Admitting: Podiatry

## 2014-10-09 VITALS — Ht 65.0 in | Wt 285.0 lb

## 2014-10-09 DIAGNOSIS — M79606 Pain in leg, unspecified: Secondary | ICD-10-CM | POA: Diagnosis not present

## 2014-10-09 DIAGNOSIS — B351 Tinea unguium: Secondary | ICD-10-CM

## 2014-10-09 NOTE — Progress Notes (Signed)
Subjective: Patient came in using a cane and in good spirit.  Patient has had lumpectomy left and three biopsies on left breast. Surgery done in January 11, 2013. Nails are thick and request for trimming.   Objective: Thick dystrophic nail x 10, with thick residue over the great toe from [place] treatment. Severe pedal edema bilateral. Severely contracted lesser digits bilateral.   Assessment: Onychomycosis x 10. Painful feet.   Plan: Debrided all nails. Return in 3 months.

## 2014-10-09 NOTE — Patient Instructions (Signed)
Seen for hypertrophic nails. All nails debrided. Return in 3 months or as needed.  

## 2014-10-10 ENCOUNTER — Ambulatory Visit: Payer: Medicare Other | Admitting: Physical Therapy

## 2014-10-10 DIAGNOSIS — M25511 Pain in right shoulder: Secondary | ICD-10-CM | POA: Diagnosis not present

## 2014-10-10 DIAGNOSIS — R296 Repeated falls: Secondary | ICD-10-CM

## 2014-10-10 DIAGNOSIS — M6281 Muscle weakness (generalized): Secondary | ICD-10-CM

## 2014-10-10 DIAGNOSIS — M25411 Effusion, right shoulder: Secondary | ICD-10-CM

## 2014-10-10 DIAGNOSIS — M24511 Contracture, right shoulder: Secondary | ICD-10-CM

## 2014-10-10 NOTE — Therapy (Signed)
[place], [place], [address] Phone: [phone]   Fax:  [phone]  Physical Therapy Treatment  Patient Details  Name: [name] MRN: [id number] Date of Birth: December 02, 1940 Referring Provider:  Willey Blade, MD  Encounter Date: 10/10/2014    Past Medical History  Diagnosis Date  . Hyperlipidemia   . Hypertension   . Cold     just started 01/02/13 cough  . Complication of anesthesia     very phobic about needles.last surgery had to sedate before doing iv  . Exertional shortness of breath   . Uterine cancer   . Chronic lower back pain   . Depression   . Arthritis     "joints; mostly on the right side" (01/11/2013)  . Breast cancer     "left" (01/11/2013)  . Cancer     endometrial  . Allergy   . Osteoporosis     knees   . Status post radiation therapy within last four weeks 03/19/13-05/03/13    lt breast 60.4GY    Past Surgical History  Procedure Laterality Date  . Replacement total knee Bilateral 2011-2012  . Foot tendon surgery Right   . Breast lumpectomy with needle localization and axillary sentinel lymph node bx Left 01/11/2013  . Breast biopsy Left 12/2012  . Abdominal hysterectomy  12/14/2012  . Dilation and curettage of uterus      "@ least one" (01/11/2013)  . Tubal ligation    . Cholecystectomy  1970  . Knee arthroscopy Right 1990's    "twice" (01/11/2013)  . Breast lumpectomy with needle localization and axillary sentinel lymph node bx Left 01/11/2013    Procedure: BREAST LUMPECTOMY WITH NEEDLE LOCALIZATION AND AXILLARY SENTINEL LYMPH NODE BX;  Surgeon: Rolm Bookbinder, MD;  Location: [facility];  Service: General;  Laterality: Left;    There were no vitals filed for this visit.  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  She still has 'popcorn" when she raises her arms up to put her clothes on   Pertinent History breast cancer and treatment, arthritis, got hit in the back of head with a gymnastic ring and has had pain in back since.    Currently in Pain? Yes   Pain Score 3    Pain Location Neck   Pain Orientation Right;Left   Pain Descriptors / Indicators Aching   Pain Type Chronic pain   Pain Onset More than a month ago                         [facility] Adult PT Treatment/Exercise - 10/10/14 0001    Bed Mobility   Rolling Right 5: Supervision   Rolling Left 5: Supervision   Right Sidelying to Sit 5: Supervision   Lumbar Exercises: Seated   Other Seated Lumbar Exercises rows with black  theraband    Other Seated Lumbar Exercises trunk rotation by rollling large ball in semicircle inf front of ther, then with hand together at chest leve.   Knee/Hip Exercises: Standing   Knee Flexion --  6 pounds on each leg    Other Standing Knee Exercises marching  x ~ 50 feet iwth 6 pounds on each leg    Shoulder Exercises:  Seated   Other Seated Exercises closed chain pushing on big ball with circles in each direction for about 5 minutes.    Other Seated Exercises ue ranger : sitting in front of it moved shoulder in multiple planes for several minutes keeping shoulder down to avoid crepitus   Shoulder Exercises: Standing   Other Standing Exercises free motion machine elbow extemsion facing the machine and then facing away from machine, paritla shoulder flexion both at second peg keeping abds engaged                   Short Term Clinic Goals - 10/03/14 1737    CC Short Term Goal  #1   Title pt will report a decrease in pain by 25%   Status Deferred   CC Short Term Goal  #2   Title pt will be able to perfomr 5 repetition of sit to stand in 30 seconds   Status Achieved   CC Short Term Goal  #3   Title pt will have a decrase  in Quick Dash Scoure by 5 points   Status Achieved   CC Short Term Goal  #4   Title pt will be able to perform a basic exercise program at home   Status Achieved             [place] - 10/10/14 1720    CC Long Term Goal  #1   Title pt will report decrease in pain by 50%   Status On-going   CC Long Term Goal  #2   Title pt will report increase in her ability to care for herself by 50%   Status On-going   CC Long Term Goal  #3   Title pt will be able to stand for 3 minutes inable to prepare a meal for herself at home   Status On-going   CC Long Term Goal  #4   Title pt will be independent in an advanced home program.(includes stretch, strenghen and endurance component)   Status On-going   CC Long Term Goal  #5   Title pt will have 125 degrees of right and left shoulder flexion for increased activities of daily living   Status On-going            Plan - 10/10/14 1558    Clinical Impression Statement pt pleased with increased ease of mobility today She is benefiting from updraded exercise with free motion and UE ranger.    PT Next Visit Plan standing free motion facing it pull down, and facting away from it, sseated chop and lift, seated UE ranger with both arms marching with 6 # on each leg        Problem List Patient Active Problem List   Diagnosis Date Noted  . Breast cancer of upper-outer quadrant of left female breast 01/24/2013  . Edema 01/09/2013  . Onychomycosis 01/09/2013  . Pain in joint, ankle and foot 01/09/2013   Donato Heinz. Owens Shark, PT  10/10/2014, [place], [place], [address] Phone: [phone]   Fax:  [phone]

## 2014-10-15 ENCOUNTER — Ambulatory Visit: Payer: Medicare Other | Admitting: Physical Therapy

## 2014-10-15 DIAGNOSIS — M6281 Muscle weakness (generalized): Secondary | ICD-10-CM

## 2014-10-15 DIAGNOSIS — M25511 Pain in right shoulder: Secondary | ICD-10-CM | POA: Diagnosis not present

## 2014-10-15 NOTE — Therapy (Signed)
[place], [place], [address] Phone: [phone]   Fax:  [phone]  Physical Therapy Treatment  Patient Details  Name: [name] MRN: [id number] Date of Birth: [birth year] Referring Provider:  Rolm Bookbinder, MD  Encounter Date: 10/15/2014      PT End of Session - 10/15/14 1708    Visit Number 15   Number of Visits 33   Date for PT Re-Evaluation 12/03/14   PT Start Time 1525   PT Stop Time 1610   PT Time Calculation (min) 45 min   Activity Tolerance Patient tolerated treatment well   Behavior During Therapy [facility] for tasks assessed/performed      Past Medical History  Diagnosis Date  . Hyperlipidemia   . Hypertension   . Cold     just started 01/02/13 cough  . Complication of anesthesia     very phobic about needles.last surgery had to sedate before doing iv  . Exertional shortness of breath   . Uterine cancer   . Chronic lower back pain   . Depression   . Arthritis     "joints; mostly on the right side" (01/11/2013)  . Breast cancer     "left" (01/11/2013)  . Cancer     endometrial  . Allergy   . Osteoporosis     knees   . Status post radiation therapy within last four weeks 03/19/13-05/03/13    lt breast 60.4GY    Past Surgical History  Procedure Laterality Date  . Replacement total knee Bilateral 2011-2012  . Foot tendon surgery Right   . Breast lumpectomy with needle localization and axillary sentinel lymph node bx Left 01/11/2013  . Breast biopsy Left 12/2012  . Abdominal hysterectomy  12/14/2012  . Dilation and curettage of uterus      "@ least one" (01/11/2013)  . Tubal ligation    . Cholecystectomy  1970  . Knee arthroscopy Right 1990's    "twice" (01/11/2013)  . Breast lumpectomy with needle localization and axillary sentinel lymph node bx Left 01/11/2013    Procedure: BREAST LUMPECTOMY WITH NEEDLE LOCALIZATION AND AXILLARY SENTINEL LYMPH NODE BX;  Surgeon: Rolm Bookbinder, MD;  Location: [facility];  Service: General;  Laterality: Left;    There were no vitals filed for this visit.  Visit Diagnosis:  Generalized muscle weakness      Subjective Assessment - 10/15/14 1526    Subjective Wants to do some dowel exercises to work on getting back to driving.   Currently in Pain? Yes   Pain Score 4    Pain Location Neck  to both shoulders   Pain Orientation Right;Left                         [facility] Adult PT Treatment/Exercise - 10/15/14 0001    Knee/Hip Exercises: Supine   Short Arc Quad Sets Strengthening;Right;Left;2 sets;10 reps  4# x 10, then 10# x 10   Other Supine Knee Exercises marching in supine, setting abdominals first, x 10 x 3   Shoulder Exercises: Supine   Other Supine Exercises bicep curls, 3# x 10 x 2 each + 5 more reps   Other Supine Exercises chest press with 3# in each hand x 10   Shoulder Exercises: Seated   Other Seated Exercises seated dowel stretches for shoulder abduction x 10 each right and left; also horizontal abduction left to right, 5 reps each way   Other Seated Exercises  therapist held [place] out in front of patient, who put arms up as if on a steering wheel, then patient rotated ball with therapist providing resistance x 10 each way   Moist Heat Therapy   Number Minutes Moist Heat 20 Minutes   Moist Heat Location Shoulder;Cervical  left shoulder                PT Education - 10/15/14 1707    Education provided Yes   Education Details dowel exercise in sitting for shoulder abduction, flexion, and horizontal abduction   Person(s) Educated Patient   Methods Explanation;Demonstration;Handout   Comprehension Verbalized understanding;Returned demonstration           Short Term Clinic Goals - 10/03/14 1737    CC Short Term Goal  #1   Title pt will report a decrease in pain by 25%   Status Deferred   CC Short Term Goal  #2   Title pt will be able to perfomr 5 repetition of sit to  stand in 30 seconds   Status Achieved   CC Short Term Goal  #3   Title pt will have a decrase in Quick Dash Scoure by 5 points   Status Achieved   CC Short Term Goal  #4   Title pt will be able to perform a basic exercise program at home   Status Achieved             [place] - 10/10/14 1720    CC Long Term Goal  #1   Title pt will report decrease in pain by 50%   Status On-going   CC Long Term Goal  #2   Title pt will report increase in her ability to care for herself by 50%   Status On-going   CC Long Term Goal  #3   Title pt will be able to stand for 3 minutes inable to prepare a meal for herself at home   Status On-going   CC Long Term Goal  #4   Title pt will be independent in an advanced home program.(includes stretch, strenghen and endurance component)   Status On-going   CC Long Term Goal  #5   Title pt will have 125 degrees of right and left shoulder flexion for increased activities of daily living   Status On-going            Plan - 10/15/14 1708    Clinical Impression Statement Patient requested dowel exercise for shoulder movement needed to return to driving, so this was done and instructed for HEP; also did other related exercise.  Pt. reported feeling good after session.   Pt will benefit from skilled therapeutic intervention in order to improve on the following deficits Abnormal gait;Decreased strength;Pain;Difficulty walking;Decreased activity tolerance;Decreased mobility;Impaired UE functional use;Decreased range of motion;Decreased balance;Decreased safety awareness;Postural dysfunction;Decreased endurance;Obesity   PT Treatment/Interventions Moist Heat;Therapeutic exercise   PT Next Visit Plan standing free motion facing it pull down, and facting away from it, sseated chop and lift, seated UE ranger with both arms marching with 6 # on each leg; reassess; continue exercise for patient to manipulate a car's steering wheel        Problem  List Patient Active Problem List   Diagnosis Date Noted  . Breast cancer of upper-outer quadrant of left female breast 01/24/2013  . Edema 01/09/2013  . Onychomycosis 01/09/2013  . Pain in joint, ankle and foot 01/09/2013    SALISBURY,DONNA 10/15/2014, 5:12 PM  [facility]  [place], [place], [address] Phone: [phone]   Fax:  [place], PT 10/15/2014 5:13 PM

## 2014-10-15 NOTE — Patient Instructions (Signed)
Do once or twice a day. You can sit and do these.  Inferior Capsule Stick Stretch I   Stand or sit, dowel in palm of arm to be stretched. Other arm, holding dowel in front of body, pushes outward and upward until arm being stretched is as high to the side as possible. Hold _2__ seconds. Repeat __10_ times per session. Do _1-2__ sessions per day.  Copyright  VHI. All rights reserved.  Flexors Stick Stretch I   Stand or sit, dowel in palm of arm to be stretched. Other arm, holding dowel at side and behind body, pushes arm being stretched forward and upward until straight over head. Hold __2_ seconds. Repeat _10__ times per session. Do __1-2_ sessions per day.  Copyright  VHI. All rights reserved.    Also:  Hold the stick with one hand toward each end.  Raise the stick up in front of you and move it side to side, left to right.  Do 5-10 times, 1-2 times a day.

## 2014-10-17 ENCOUNTER — Ambulatory Visit: Payer: Medicare Other | Admitting: Physical Therapy

## 2014-10-17 DIAGNOSIS — M25411 Effusion, right shoulder: Secondary | ICD-10-CM

## 2014-10-17 DIAGNOSIS — M6281 Muscle weakness (generalized): Secondary | ICD-10-CM

## 2014-10-17 DIAGNOSIS — R296 Repeated falls: Secondary | ICD-10-CM

## 2014-10-17 DIAGNOSIS — M24511 Contracture, right shoulder: Secondary | ICD-10-CM

## 2014-10-17 DIAGNOSIS — M25511 Pain in right shoulder: Secondary | ICD-10-CM

## 2014-10-17 NOTE — Therapy (Signed)
[place], [place], [address] Phone: [phone]   Fax:  [phone]  Physical Therapy Treatment  Patient Details  Name: [name] MRN: [id number] Date of Birth: May 01, 1941 Referring Provider:  Rolm Bookbinder, MD  Encounter Date: 10/17/2014      PT End of Session - 10/17/14 1613    Visit Number 16   Number of Visits 33   Date for PT Re-Evaluation 12/03/14   PT Start Time 4332   PT Stop Time 1600   PT Time Calculation (min) 42 min      Past Medical History  Diagnosis Date  . Hyperlipidemia   . Hypertension   . Cold     just started 01/02/13 cough  . Complication of anesthesia     very phobic about needles.last surgery had to sedate before doing iv  . Exertional shortness of breath   . Uterine cancer   . Chronic lower back pain   . Depression   . Arthritis     "joints; mostly on the right side" (01/11/2013)  . Breast cancer     "left" (01/11/2013)  . Cancer     endometrial  . Allergy   . Osteoporosis     knees   . Status post radiation therapy within last four weeks 03/19/13-05/03/13    lt breast 60.4GY    Past Surgical History  Procedure Laterality Date  . Replacement total knee Bilateral 2011-2012  . Foot tendon surgery Right   . Breast lumpectomy with needle localization and axillary sentinel lymph node bx Left 01/11/2013  . Breast biopsy Left 12/2012  . Abdominal hysterectomy  12/14/2012  . Dilation and curettage of uterus      "@ least one" (01/11/2013)  . Tubal ligation    . Cholecystectomy  1970  . Knee arthroscopy Right 1990's    "twice" (01/11/2013)  . Breast lumpectomy with needle localization and axillary sentinel lymph node bx Left 01/11/2013    Procedure: BREAST LUMPECTOMY WITH NEEDLE LOCALIZATION AND AXILLARY SENTINEL LYMPH NODE BX;  Surgeon: Rolm Bookbinder, MD;  Location: [facility];  Service: General;  Laterality: Left;    There were no vitals filed for this  visit.  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    Currently in Pain? Yes   Pain Score --  did not rate   Pain Location Neck   Pain Orientation Left   Pain Descriptors / Indicators Aching   Pain Type Chronic pain                         [facility] Adult PT Treatment/Exercise - 10/17/14 0001    Ambulation/Gait   Gait Comments walking with 6# weights on each leg. high stepping   Lumbar Exercises: Seated   Other Seated Lumbar Exercises trunk rotation by rollling table in a semicircle in front of her, then added neck rotation    Knee/Hip Exercises: Standing   Other Standing Knee Exercises --   Knee/Hip Exercises: Seated   Long Arc Quad Strengthening;15 reps;Weights;10 reps  6#   Other Seated Knee Exercises marches 10 reps with 6# on each leg.   Other Seated Knee Exercises hip flexion and abduction with 6 #   Shoulder Exercises: Seated   Other Seated Exercises seated unilateral chop and lift with right arm to simulate putting seat belt on   Other Seated Exercises ue ranger : sitting in front of it moved shoulder in multiple planes for several minutes keeping shoulder down to avoid crepitus   Shoulder Exercises: Standing   Other Standing Exercises free motion machine elbow extemsion facing the machine and then facing away from machine, paritla shoulder flexion both at second peg keeping abds engaged                   Short Term Clinic Goals - 10/03/14 1737    CC Short Term Goal  #1   Title pt will report a decrease in pain by 25%   Status Deferred   CC Short Term Goal  #2   Title pt will  be able to perfomr 5 repetition of sit to stand in 30 seconds   Status Achieved   CC Short Term Goal  #3   Title pt will have a decrase in Quick Dash Scoure by 5 points   Status Achieved   CC Short Term Goal  #4   Title pt will be able to perform a basic exercise program at home   Status Achieved             [facility] Goals - 10/17/14 1619    CC Long Term Goal  #1   Title pt will report decrease in pain by 50%   Status On-going   CC Long Term Goal  #2   Title pt will report increase in her ability to care for herself by 50%   Status On-going   CC Long Term Goal  #3   Title pt will be able to stand for 3 minutes inable to prepare a meal for herself at home   Status On-going   CC Long Term Goal  #4   Title pt will be independent in an advanced home program.(includes stretch, strenghen and endurance component)   Status On-going   CC Long Term Goal  #5   Title pt will have 125 degrees of right and left shoulder flexion for increased activities of daily living   Status On-going            Plan - 10/17/14 1613    Clinical Impression Statement pt continues to put forth good effort with exercise and appears to be improving.  She needs fewer cues for abdominal activation during activity and is continuing to upgrade her exrcis program She feels that she is making progress   Clinical Impairments Affecting Rehab Potential longstanding deconditioning and mobility impairment   PT Next Visit Plan standing free motion facing it pull down, and facting away from it, sseated chop and lift, seated UE ranger with both arms marching with 6 # on each leg; reassess; continue exercise for patient to manipulate a car's steering wheel and simulate getting in and out of a car        Problem List Patient Active Problem List   Diagnosis Date Noted  . Breast cancer of upper-outer quadrant of left female breast 01/24/2013  . Edema 01/09/2013  . Onychomycosis 01/09/2013  . Pain in joint,  ankle and foot 01/09/2013   Donato Heinz. Owens Shark, PT  10/17/2014, 4:20 PM  [facility]  [place], [place], [address] Phone: [phone]   Fax:  [phone]

## 2014-10-22 ENCOUNTER — Ambulatory Visit: Payer: Medicare Other | Admitting: Physical Therapy

## 2014-10-22 DIAGNOSIS — M6281 Muscle weakness (generalized): Secondary | ICD-10-CM

## 2014-10-22 DIAGNOSIS — R296 Repeated falls: Secondary | ICD-10-CM

## 2014-10-22 DIAGNOSIS — M25511 Pain in right shoulder: Secondary | ICD-10-CM

## 2014-10-22 DIAGNOSIS — M25411 Effusion, right shoulder: Secondary | ICD-10-CM

## 2014-10-22 DIAGNOSIS — M24511 Contracture, right shoulder: Secondary | ICD-10-CM

## 2014-10-22 NOTE — Therapy (Addendum)
[place], [place], [address] Phone: [phone]   Fax:  [phone]  Physical Therapy Treatment  Patient Details  Name: [name] MRN: [id number] Date of Birth: [birth year] Referring Provider:  Willey Blade, MD  Encounter Date: 10/22/2014      PT End of Session - 10/22/14 1726    Visit Number 27   Number of Visits 49   Date for PT Re-Evaluation 12/03/14   PT Start Time 6967   PT Stop Time 1600   PT Time Calculation (min) 42 min      Past Medical History  Diagnosis Date  . Hyperlipidemia   . Hypertension   . Cold     just started 01/02/13 cough  . Complication of anesthesia     very phobic about needles.last surgery had to sedate before doing iv  . Exertional shortness of breath   . Uterine cancer   . Chronic lower back pain   . Depression   . Arthritis     "joints; mostly on the right side" (01/11/2013)  . Breast cancer     "left" (01/11/2013)  . Cancer     endometrial  . Allergy   . Osteoporosis     knees   . Status post radiation therapy within last four weeks 03/19/13-05/03/13    lt breast 60.4GY    Past Surgical History  Procedure Laterality Date  . Replacement total knee Bilateral 2011-2012  . Foot tendon surgery Right   . Breast lumpectomy with needle localization and axillary sentinel lymph node bx Left 01/11/2013  . Breast biopsy Left 12/2012  . Abdominal hysterectomy  12/14/2012  . Dilation and curettage of uterus      "@ least one" (01/11/2013)  . Tubal ligation    . Cholecystectomy  1970  . Knee arthroscopy Right 1990's    "twice" (01/11/2013)  . Breast lumpectomy with needle localization and axillary sentinel lymph node bx Left 01/11/2013    Procedure: BREAST LUMPECTOMY WITH NEEDLE LOCALIZATION AND AXILLARY SENTINEL LYMPH NODE BX;  Surgeon: Rolm Bookbinder, MD;  Location: [facility];  Service: General;  Laterality: Left;    There were no vitals filed for this  visit.  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    Currently in Pain? Yes   Pain Score 4    Pain Location Shoulder   Pain Orientation Right;Left   Pain Descriptors / Indicators Aching                         [facility] Adult PT Treatment/Exercise - 10/22/14 0001    Lumbar Exercises: Stretches   Lower Trunk Rotation 5 reps   Lumbar Exercises: Standing   Other Standing Lumbar Exercises over a chair, opposite arm and leg raise "superwoman" x 5 reps   Lumbar Exercises: Seated   Other Seated Lumbar Exercises trunk rotation  with  opposite hand on outside of knee  and other leg extended     Lumbar Exercises: Supine   Clam 5 reps   Bridge 5 reps   Knee/Hip Exercises: Seated  Other Seated Knee Exercises seated hamsting stretch    Shoulder Exercises: Supine   Other Supine Exercises chest press with 2# in each hand 2 sets of 10   Shoulder Exercises: ROM/Strengthening   Other ROM/Strengthening Exercises standing facing wall for chest stretch, gastroc stretch and weight shift.                 PT Education - 10/22/14 1726    Education provided Yes   Education Details Strength ABC with modifications to handout    Person(s) Educated Patient   Methods Explanation;Demonstration;Handout   Comprehension Verbalized understanding;Returned demonstration;Need further instruction           Short Term Clinic Goals - 10/22/14 1729    CC Short Term Goal  #1   Title pt will report a decrease in pain by 25%   Status Deferred   CC Short Term Goal  #2   Title pt will be able to perfomr 5 repetition of sit to stand in 30 seconds   Status Achieved    CC Short Term Goal  #3   Title pt will have a decrase in Quick Dash Scoure by 5 points   Status Achieved   CC Short Term Goal  #4   Title pt will be able to perform a basic exercise program at home   Status Achieved             [facility] Goals - 10/22/14 1729    CC Long Term Goal  #1   Title pt will report decrease in pain by 50%   Status On-going   CC Long Term Goal  #2   Title pt will report increase in her ability to care for herself by 50%   Status On-going   CC Long Term Goal  #3   Title pt will be able to stand for 3 minutes inable to prepare a meal for herself at home   Status On-going   CC Long Term Goal  #4   Title pt will be independent in an advanced home program.(includes stretch, strenghen and endurance component)   Status On-going   CC Long Term Goal  #5   Title pt will have 125 degrees of right and left shoulder flexion for increased activities of daily living   Status On-going            Plan - 10/22/14 1726    Clinical Impression Statement continues to progress.  Upgraded home program to [facility] with  modifications.  Began discussion of integration into community exercise program.  Pt interested in [place] aquatic program   PT Next Visit Plan continue with second half of Strength ABC program         Problem List Patient Active Problem List   Diagnosis Date Noted  . Breast cancer of upper-outer quadrant of left female breast 01/24/2013  . Edema 01/09/2013  . Onychomycosis 01/09/2013  . Pain in joint, ankle and foot 01/09/2013  Donato Heinz. Owens Shark, PT 10/22/2014, 5:37 PM      PHYSICAL THERAPY DISCHARGE SUMMARY  Visits from Start of Care: 27  Current functional level related to goals / functional outcomes: Some improvement, but still needs assistive device    Remaining deficits: As above    Education / Equipment: Home exercise  Plan: Patient agrees to discharge.  Patient goals were partially met. Patient is being  discharged due to not returning since the last visit.  ?????  Maudry Diego, PT 07/03/2015 11:24 AM      [place], [place], [address] Phone: [phone]   Fax:  [phone]

## 2014-10-25 ENCOUNTER — Other Ambulatory Visit: Payer: Self-pay | Admitting: Oncology

## 2014-11-13 ENCOUNTER — Other Ambulatory Visit: Payer: Self-pay | Admitting: Oncology

## 2014-11-15 ENCOUNTER — Telehealth: Payer: Self-pay | Admitting: *Deleted

## 2014-11-15 NOTE — Telephone Encounter (Signed)
Reached patient who has not spoke with son yet.  reports she did look at the order.  "Pharmacy changed the number on the bottle and divided the order in three separate bottles and didn't tell me they'd changed a thing."  Advised the refill is a new order for the same drug so it will have a new number and to discard old ill bottles.  "I took the pills and put them in the old bottle."

## 2014-11-15 NOTE — Telephone Encounter (Signed)
Patient called reporting she does not have exemestane and pharmacy does not have record of refill.  Observed order was on 10-25-2014 and received by pharmacy.  Called [facility] at [place] to confirm order.  Katrina reports this was filled for 90 pills and picked up on June 6. 2016 for $25.00.  Called patient but unable to reach her.  Called son who reports Time Suzan Slick was to fix the phone line but he will try to contact her.  Awaiting return call to clarify status of order picked up on 10-28-2014.

## 2014-11-18 ENCOUNTER — Telehealth: Payer: Self-pay | Admitting: Oncology

## 2014-11-18 ENCOUNTER — Ambulatory Visit (HOSPITAL_BASED_OUTPATIENT_CLINIC_OR_DEPARTMENT_OTHER): Payer: Medicare Other | Admitting: Oncology

## 2014-11-18 ENCOUNTER — Other Ambulatory Visit (HOSPITAL_BASED_OUTPATIENT_CLINIC_OR_DEPARTMENT_OTHER): Payer: Medicare Other

## 2014-11-18 VITALS — BP 153/76 | HR 103 | Temp 97.8°F | Resp 19 | Ht 65.0 in | Wt 279.6 lb

## 2014-11-18 DIAGNOSIS — C50412 Malignant neoplasm of upper-outer quadrant of left female breast: Secondary | ICD-10-CM

## 2014-11-18 DIAGNOSIS — Z79811 Long term (current) use of aromatase inhibitors: Secondary | ICD-10-CM

## 2014-11-18 DIAGNOSIS — Z923 Personal history of irradiation: Secondary | ICD-10-CM

## 2014-11-18 DIAGNOSIS — M25512 Pain in left shoulder: Secondary | ICD-10-CM

## 2014-11-18 DIAGNOSIS — Z170 Estrogen receptor positive status [ER+]: Secondary | ICD-10-CM

## 2014-11-18 DIAGNOSIS — C50919 Malignant neoplasm of unspecified site of unspecified female breast: Secondary | ICD-10-CM

## 2014-11-18 LAB — CBC WITH DIFFERENTIAL/PLATELET
BASO%: 0.9 % (ref 0.0–2.0)
BASOS ABS: 0.1 10*3/uL (ref 0.0–0.1)
EOS%: 1.5 % (ref 0.0–7.0)
Eosinophils Absolute: 0.1 10*3/uL (ref 0.0–0.5)
HEMATOCRIT: 44.8 % (ref 34.8–46.6)
HGB: 14.3 g/dL (ref 11.6–15.9)
LYMPH#: 1.1 10*3/uL (ref 0.9–3.3)
LYMPH%: 20 % (ref 14.0–49.7)
MCH: 26.8 pg (ref 25.1–34.0)
MCHC: 31.8 g/dL (ref 31.5–36.0)
MCV: 84.2 fL (ref 79.5–101.0)
MONO#: 0.5 10*3/uL (ref 0.1–0.9)
MONO%: 9 % (ref 0.0–14.0)
NEUT%: 68.6 % (ref 38.4–76.8)
NEUTROS ABS: 3.9 10*3/uL (ref 1.5–6.5)
PLATELETS: 187 10*3/uL (ref 145–400)
RBC: 5.32 10*6/uL (ref 3.70–5.45)
RDW: 15.2 % — ABNORMAL HIGH (ref 11.2–14.5)
WBC: 5.7 10*3/uL (ref 3.9–10.3)

## 2014-11-18 LAB — COMPREHENSIVE METABOLIC PANEL (CC13)
ALK PHOS: 83 U/L (ref 40–150)
ALT: 18 U/L (ref 0–55)
AST: 13 U/L (ref 5–34)
Albumin: 3.6 g/dL (ref 3.5–5.0)
Anion Gap: 11 mEq/L (ref 3–11)
BILIRUBIN TOTAL: 0.49 mg/dL (ref 0.20–1.20)
BUN: 13.4 mg/dL (ref 7.0–26.0)
CO2: 26 mEq/L (ref 22–29)
CREATININE: 1.2 mg/dL — AB (ref 0.6–1.1)
Calcium: 9.9 mg/dL (ref 8.4–10.4)
Chloride: 104 mEq/L (ref 98–109)
EGFR: 55 mL/min/{1.73_m2} — AB (ref >90)
Glucose: 132 mg/dl (ref 70–140)
POTASSIUM: 4.5 meq/L (ref 3.5–5.1)
Sodium: 141 mEq/L (ref 136–145)
Total Protein: 7 g/dL (ref 6.4–8.3)

## 2014-11-18 NOTE — Telephone Encounter (Signed)
Gave avs & calendar for February & March 2017

## 2014-11-18 NOTE — Progress Notes (Signed)
[facility]  [phone] [phone]     ID: [name] OB: 12-27-1940  MR#: [id number]  RCV#:[id number]  PCP: Salena Saner., MD GYN:  Genia Del SU: Rolm Bookbinder OTHER MD:  Kyung Rudd, Lennette Bihari supple  CHIEF COMPLAINT: Early stage breast cancer TREATMENT: On antiestrogen therapy  BREAST CANCER HISTORY: From Dr Jenny Reichmann Moody's initial consult note 01/29/2013:  "[name] is a 74 y.o. female who is seen for an initial consultation visit. The patient has had multiple medical issues recently. By report the patient proceeded with a hysterectomy/BSO for endometrial cancer recently at [facility]. The patient did have some difficulty recovering from this and was in a skilled nursing facility for a short period of time. She then thought that she felt a possible mass within the left breast and she sought further evaluation for this. The patient proceeded with a mammogram and ultrasound which corresponded to suspicious findings.a core biopsy was obtained and this returned positive for invasive ductal carcinoma. Receptor studies have indicated that the tumor is ER positive, PR positive, and HER-2/neu negative. She then proceeded to undergo an MRI scan of the breasts bilaterally on 12/26/2012. This revealed a 1.9 cm irregular enhancing mass within the left breast at the 12:00 position. This corresponded to the recently diagnosed invasive mammary carcinoma.   The patient was seen by Dr. Donne Hazel and she proceeded to perform a lumpectomy and sentinel lymph node evaluation on 01/11/2013. The patient's pathology returned positive for an invasive ductal carcinoma measuring 1.9 cm. The margins were negative with the closest margin being 1 cm. The sentinel lymph nodes were negative. This therefore represented a T1 C. N0 M0 tumor. "  Her subsequent history is as detailed below  INTERVAL HISTORY: [name] returns today for followup of her  breast cancer. She continues on exemestane, which she generally is tolerating well. She does not have significant problems with hot flashes, although she has some; vaginal dryness is not an issue. She has some "popping" in her joints which she thinks might be related to the medication. She obtains it at about $25 for 38 months supply.  REVIEW OF SYSTEMS: [name] tells me she fell about 3 times since her last visit here. She is using a "walker chair" and that has helped. She went to see Dr. Onnie Graham to see if he could help her left frozen shoulder but she understood them to say that "there was nothing to be done". She continues to have significant pain in her shoulder and knees as well as her back. She has ringing in her years. He she has poor circulation, with skin changes in the lower extremities related to that. Her appetite is poor and her sense of taste is a little bit different. She's lost a few pounds, she thinks. She has stress urinary incontinence. She is forgetful and depressed. A detailed review of systems today was otherwise stable  PAST MEDICAL HISTORY: Past Medical History  Diagnosis Date  . Hyperlipidemia   . Hypertension   . Cold     just started 01/02/13 cough  . Complication of anesthesia     very phobic about needles.last surgery had to sedate before doing iv  . Exertional shortness of breath   . Uterine cancer   . Chronic lower back pain   . Depression   . Arthritis     "joints; mostly on the right side" (01/11/2013)  . Breast cancer     "left" (01/11/2013)  . Cancer  endometrial  . Allergy   . Osteoporosis     knees   . Status post radiation therapy within last four weeks 03/19/13-05/03/13    lt breast 60.4GY    PAST SURGICAL HISTORY: Past Surgical History  Procedure Laterality Date  . Replacement total knee Bilateral 2011-2012  . Foot tendon surgery Right   . Breast lumpectomy with needle localization and axillary sentinel lymph node bx Left 01/11/2013  . Breast  biopsy Left 12/2012  . Abdominal hysterectomy  12/14/2012  . Dilation and curettage of uterus      "@ least one" (01/11/2013)  . Tubal ligation    . Cholecystectomy  1970  . Knee arthroscopy Right 1990's    "twice" (01/11/2013)  . Breast lumpectomy with needle localization and axillary sentinel lymph node bx Left 01/11/2013    Procedure: BREAST LUMPECTOMY WITH NEEDLE LOCALIZATION AND AXILLARY SENTINEL LYMPH NODE BX;  Surgeon: Rolm Bookbinder, MD;  Location: [facility];  Service: General;  Laterality: Left;    FAMILY HISTORY No family history on file.  the patient does not know the cause of death of either parent. She was a foster child and really knows very little about her family. She has some half sisters that she is aware of, but does not know them very well.   GYNECOLOGIC HISTORY:   menarche age 77, first live birth age 41, the patient is [name] P2. She went through the change of life around the age of 57. She took hormone replacement through the women's health study. 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 She is divorced, and lives by herself, with no pets. Daughter [name] unfortunately died, sudden death, cause unclear. Son [name] lives in [place] and has a Designer, jewellery in psychology. The patient has no grandchildren. She is not a Ambulance person.    ADVANCED DIRECTIVES: Not in place but the patient intends to name her son as her healthcare power of attorney. He can be reached at [phone]. At the 10/10/2013 visit the patient was given the appropriate documents to complete and notarize at her discretion   HEALTH MAINTENANCE: History  Substance Use Topics  . Smoking status: Never Smoker   . Smokeless tobacco: Never Used  . Alcohol Use: No     Colonoscopy:  PAP: Status post hysterectomy and bilateral salpingo-oophorectomy   Bone density: April 2014, showed minimal osteopenia, with a T score of  -1.1  Lipid panel:  Allergies  Allergen Reactions  . Iodine Rash    Current Outpatient Prescriptions  Medication Sig Dispense Refill  . acetaminophen-codeine (TYLENOL #3) 300-30 MG per tablet Take 1 tablet by mouth every 6 (six) hours as needed for pain.    Marland Kitchen buPROPion (WELLBUTRIN XL) 150 MG 24 hr tablet Take 150 mg by mouth 2 (two) times daily.     . Calcium Carbonate-Vitamin D (CALTRATE 600+D) 600-400 MG-UNIT per tablet Take 1 tablet by mouth daily.    . carisoprodol (SOMA) 350 MG tablet Take 350 mg by mouth 2 (two) times daily.     . Diclofenac Sodium (PENNSAID) 1.5 % SOLN Place 1 Bottle onto the skin daily as needed. For pain    . DULoxetine (CYMBALTA) 20 MG capsule Take 1 capsule (20 mg total) by mouth daily. 30 capsule 3  . exemestane (AROMASIN) 25 MG tablet TAKE ONE TABLET BY MOUTH ONCE DAILY AFTER  BREAKFAST 90 tablet 0  . FIBER SELECT GUMMIES PO Take  2 tablets by mouth every morning.    . gabapentin (NEURONTIN) 300 MG capsule Take 1 capsule (300 mg total) by mouth at bedtime. 90 capsule 4  . hyaluronate sodium (RADIAPLEXRX) GEL Apply 1 application topically 2 (two) times daily. Add to affected skin area after rad tx and in am after your shower/bath, just not 4 hours prior to coming for rad tx    . HYDROcodone-acetaminophen (NORCO/VICODIN) 5-325 MG per tablet Take 1 tablet by mouth every 6 (six) hours as needed for moderate pain. 30 tablet 0  . metformin (FORTAMET) 500 MG (OSM) 24 hr tablet Take 500 mg by mouth daily with breakfast.    . metoprolol succinate (TOPROL-XL) 50 MG 24 hr tablet Take 50 mg by mouth 2 (two) times daily.     . Multiple Vitamin (MULTIVITAMIN WITH MINERALS) TABS tablet Take 1 tablet by mouth daily.    . RESTASIS 0.05 % ophthalmic emulsion Place 1 drop into both eyes 2 (two) times daily.      No current facility-administered medications for this visit.    OBJECTIVE: Elderly African American woman who appears stated age  63 Vitals:   11/18/14 1141  BP:  153/76  Pulse: 103  Temp: 97.8 F (36.6 C)  Resp: 19     Body mass index is 46.53 kg/(m^2).    ECOG FS:2 - Symptomatic, <50% confined to bed  Sclerae unicteric, pupils round and equal Oropharynx clear and moist-- no thrush or other lesions No cervical or supraclavicular adenopathy Lungs no rales or rhonchi Heart regular rate and rhythm Abd soft, obese, nontender, positive bowel sounds MSK no focal spinal tenderness, no upper extremity lymphedema, frozen left shoulder as previously noted Neuro: nonfocal, well oriented, appropriate affect Breasts: The right breast is unremarkable. The left breast is status post lumpectomy and radiation. It is darker and smaller than the right breast. There is no evidence of local recurrence. Left axilla is benign.   LAB RESULTS:  CMP     Component Value Date/Time   NA 143 02/06/2014 1314   NA 139 01/05/2013 1357   K 3.9 02/06/2014 1314   K 4.1 01/05/2013 1357   CL 102 01/05/2013 1357   CO2 27 02/06/2014 1314   CO2 26 01/05/2013 1357   GLUCOSE 137 02/06/2014 1314   GLUCOSE 145* 01/05/2013 1357   BUN 9.3 02/06/2014 1314   BUN 12 01/05/2013 1357   CREATININE 1.1 02/06/2014 1314   CREATININE 0.95 01/11/2013 1422   CALCIUM 9.9 02/06/2014 1314   CALCIUM 9.6 01/05/2013 1357   PROT 7.2 02/06/2014 1314   PROT 7.0 03/31/2010 1115   ALBUMIN 3.3* 02/06/2014 1314   ALBUMIN 3.6 03/31/2010 1115   AST 14 02/06/2014 1314   AST 21 03/31/2010 1115   ALT 17 02/06/2014 1314   ALT 21 03/31/2010 1115   ALKPHOS 70 02/06/2014 1314   ALKPHOS 71 03/31/2010 1115   BILITOT 0.42 02/06/2014 1314   BILITOT 0.7 03/31/2010 1115   GFRNONAA 59* 01/11/2013 1422   GFRAA 69* 01/11/2013 1422    I No results found for: SPEP  Lab Results  Component Value Date   WBC 5.7 11/18/2014   NEUTROABS 3.9 11/18/2014   HGB 14.3 11/18/2014   HCT 44.8 11/18/2014   MCV 84.2 11/18/2014   PLT 187 11/18/2014      Chemistry      Component Value Date/Time   NA 143 02/06/2014  1314   NA 139 01/05/2013 1357   K 3.9 02/06/2014 1314   K  4.1 01/05/2013 1357   CL 102 01/05/2013 1357   CO2 27 02/06/2014 1314   CO2 26 01/05/2013 1357   BUN 9.3 02/06/2014 1314   BUN 12 01/05/2013 1357   CREATININE 1.1 02/06/2014 1314   CREATININE 0.95 01/11/2013 1422      Component Value Date/Time   CALCIUM 9.9 02/06/2014 1314   CALCIUM 9.6 01/05/2013 1357   ALKPHOS 70 02/06/2014 1314   ALKPHOS 71 03/31/2010 1115   AST 14 02/06/2014 1314   AST 21 03/31/2010 1115   ALT 17 02/06/2014 1314   ALT 21 03/31/2010 1115   BILITOT 0.42 02/06/2014 1314   BILITOT 0.7 03/31/2010 1115       No results found for: LABCA2  No components found for: LABCA125  No results for input(s): INR in the last 168 hours.  Urinalysis    Component Value Date/Time   COLORURINE AMBER BIOCHEMICALS MAY BE AFFECTED BY COLOR* 03/31/2010 1116   APPEARANCEUR CLOUDY* 03/31/2010 1116   LABSPEC 1.031* 03/31/2010 1116   PHURINE 5.5 03/31/2010 1116   GLUCOSEU NEGATIVE 03/31/2010 1116   HGBUR NEGATIVE 03/31/2010 1116   [place] 03/31/2010 1116   KETONESUR NEGATIVE 03/31/2010 1116   PROTEINUR NEGATIVE 03/31/2010 1116   UROBILINOGEN 0.2 03/31/2010 1116   NITRITE NEGATIVE 03/31/2010 1116   LEUKOCYTESUR  03/31/2010 1116    NEGATIVE MICROSCOPIC NOT DONE ON URINES WITH NEGATIVE PROTEIN, BLOOD, LEUKOCYTES, NITRITE, OR GLUCOSE <1000 mg/dL.    STUDIES: CLINICAL DATA: History of left lumpectomy for breast cancer 2014.  EXAM: DIGITAL DIAGNOSTIC bilateral MAMMOGRAM WITH CAD  COMPARISON: Prior exams  ACR Breast Density Category c: The breast tissue is heterogeneously dense, which may obscure small masses.  FINDINGS: Suboptimal positioning due to patient immobility and body habitus. This may decrease the sensitivity and specificity of mammography for detection of malignancy. Left lumpectomy changes are noted. Benign-appearing calcifications associated with presumed fat necrosis within the  left lumpectomy site are reidentified and without significant interval change or malignant type morphology or distribution. No new evidence for malignancy is identified in either breast.  Mammographic images were processed with CAD.  IMPRESSION: No evidence for malignancy in either breast.  RECOMMENDATION: Diagnostic mammogram is suggested in 1 year. (Code:DM-B-01Y)  I have discussed the findings and recommendations with the patient. Results were also provided in writing at the conclusion of the visit. If applicable, a reminder letter will be sent to the patient regarding the next appointment.  BI-RADS CATEGORY 2: Benign.   Electronically Signed  By: Conchita Paris M.D.  On: 07/02/2014 14:45   ASSESSMENT: 74 y.o. 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  (1) Oncotype DX score of 10 predicts an outside the breast risk of recurrence within 10 years of 7% if the patient's only systemic treatment is tamoxifen for 5 years. It also predicts no benefit from chemotherapy.  (2) the patient completed adjuvant radiation December 2014  (3) on anastrozole and January through March of 2015, discontinued because of side effects  (4) exemestane started June 2015  (5) status post robotic hysterectomy with bilateral salpingo-oophorectomy 12/14/2012 for a noninvasive grade 1 endometrial cancer   PLAN: Bobi is tolerating the exemestane well. The plan will be to continue that for a total of 5 years.  She does not want to see a nurse practitioner. I am  going to see her on a once a year basis beginning with the next visit here which will be in March, after her February mammogram.  I am putting her in 4 bone density as well at the same time as her next  mammogram. She is aware that aromatase inhibitors can "thin the bones".  I don't have a solution for her frozen left shoulder. She saw the same orthopedist that did my shoulders so I have no better recommendation. If she feels physical therapy is helping, we certainly can continue that as well as there is some evidence of improvement.  I am concerned that she is isolated here in [place] and increasingly limited physically. I am asking our social workers to give her a call and see if we have something else we can offer her.  Chauncey Cruel, MD   11/18/2014 11:55 AM

## 2014-11-19 ENCOUNTER — Ambulatory Visit: Payer: Medicare Other | Attending: General Surgery | Admitting: Physical Therapy

## 2014-11-20 ENCOUNTER — Other Ambulatory Visit: Payer: Self-pay | Admitting: *Deleted

## 2014-11-20 ENCOUNTER — Encounter: Payer: Self-pay | Admitting: *Deleted

## 2014-11-20 MED ORDER — EXEMESTANE 25 MG PO TABS: 25.0000 mg | ORAL_TABLET | Freq: Every day | ORAL | Status: DC

## 2014-11-20 NOTE — Progress Notes (Signed)
[name]  Clinical Social Work was referred by Futures trader for assessment of psychosocial needs due to social isolation.  Pt currently in survivorship. Clinical Social Worker has worked with pt in the past and contacted patient at home to offer support and assess for needs. Pt currently has CNA to assist her in the home 3 days a week. She finds this very helpful. She states the "doctor thinks I need to go in a home, but I am doing fine." CSW re-educated pt on resources for additional support in the community for older adults. CSW made referral to [facility] for pt and she is interested in their services. Pt continues to follow up with PT and is hopeful her shoulder issues are addressed soon.  Clinical Social Work interventions: Resource education and referral Pt advocacy  [name], Portal Worker [place]  [place] Phone: [phone] Fax: [phone]

## 2014-11-22 ENCOUNTER — Ambulatory Visit: Payer: Medicare Other | Attending: General Surgery | Admitting: Physical Therapy

## 2015-01-10 ENCOUNTER — Encounter: Payer: Self-pay | Admitting: Podiatry

## 2015-01-10 ENCOUNTER — Ambulatory Visit (INDEPENDENT_AMBULATORY_CARE_PROVIDER_SITE_OTHER): Payer: Medicare Other | Admitting: Podiatry

## 2015-01-10 DIAGNOSIS — M79606 Pain in leg, unspecified: Secondary | ICD-10-CM

## 2015-01-10 DIAGNOSIS — B351 Tinea unguium: Secondary | ICD-10-CM

## 2015-01-10 NOTE — Progress Notes (Signed)
Subjective: 74 year old female presents walking with a cane accompanied by her son. Patient is pleasant and in good spirit.  Nails are thick and request for trimming.  She was advised to use compression socks for lower limb edema. Patient stated that she was not able to handle compression socks and can not use them. She lives alone. Past history reveal radiation treatment for breast cancer.  Patient has had lumpectomy left and three biopsies on left breast. Surgery done in January 11, 2013. Positive history of knee surgery bilateral 5 years ago.  Objective: Thick dystrophic nail x 10, with thick residue over the great toe from [place] treatment. 2+ foot and ankle edema bilateral. Severely contracted lesser digits bilateral.  Dorsalis pedis arteries are palpable on both feet.  All epicritic and tactile sensations grossly intact.   Assessment: Onychomycosis x 10. Bilateral pedal edema.  Painful nails.  Plan: Debrided all nails.  Patient was advised to consult PCP to see if she can take fluid pills to control lower limb edema.  Return in 3 months.

## 2015-01-10 NOTE — Patient Instructions (Signed)
Seen for hypertrophic nails. Noted of swelling on both feet. All nails debrided. May benefit from compression socks. Return in 3 months or as needed.

## 2015-02-11 ENCOUNTER — Telehealth: Payer: Self-pay | Admitting: *Deleted

## 2015-02-11 NOTE — Telephone Encounter (Signed)
TC received from pt's PCP Dr. Willey Blade requesting a Physical Therapy referral for breast cancer patients. Pt is having trouble with upper extremity mobility and her PCP feels she would benefit from PT. Please notify patient with scheduling this.

## 2015-02-12 ENCOUNTER — Telehealth: Payer: Self-pay | Admitting: *Deleted

## 2015-02-12 NOTE — Telephone Encounter (Signed)
Received VM message from pt regarding her prescription for Exemestane.  Per her med list she had a prescription filled on 11/20/14 for 90 tablets with 3 refills. Confirmed with pharmacy that her prescription is available. Sharyn Lull at pharmacy states prescription will be ready tomorrow.  TC to patient and advised of the above.

## 2015-02-27 ENCOUNTER — Other Ambulatory Visit: Payer: Self-pay

## 2015-02-27 DIAGNOSIS — C50412 Malignant neoplasm of upper-outer quadrant of left female breast: Secondary | ICD-10-CM

## 2015-02-27 NOTE — Progress Notes (Signed)
Expand All Collapse All   TC received from pt's PCP Dr. Willey Blade requesting a Physical Therapy referral for breast cancer patients. Pt is having trouble with upper extremity mobility and her PCP feels she would benefit from PT. Please notify patient with scheduling this.       Writer placed order for PT evaluation, POF sent to schedulers.

## 2015-04-15 ENCOUNTER — Ambulatory Visit (INDEPENDENT_AMBULATORY_CARE_PROVIDER_SITE_OTHER): Payer: Medicare Other | Admitting: Podiatry

## 2015-04-15 ENCOUNTER — Encounter: Payer: Self-pay | Admitting: Podiatry

## 2015-04-15 DIAGNOSIS — B351 Tinea unguium: Secondary | ICD-10-CM

## 2015-04-15 DIAGNOSIS — M79606 Pain in leg, unspecified: Secondary | ICD-10-CM

## 2015-04-15 NOTE — Progress Notes (Signed)
Subjective: 74 year old female presents walking with a cane requesting toe nails trimmed. Patient is pleasant and in good spirit.  Nails are thick and request for trimming.   Past history reveal radiation treatment for breast cancer.  Patient has had lumpectomy left and three biopsies on left breast. Surgery done in January 11, 2013. Positive history of knee surgery bilateral 5 years ago.  Objective: Thick dystrophic nail x 10. Severely contracted lesser digits bilateral.  Dorsalis pedis arteries are palpable on both feet.  All epicritic and tactile sensations grossly intact.   Assessment: Onychomycosis x 10. Painful nails.  Plan: Debrided all nails.  Return in 3 months.

## 2015-04-15 NOTE — Patient Instructions (Signed)
Seen for hypertrophic nails. All nails debrided. Return in 3 months or as needed.  

## 2015-07-08 ENCOUNTER — Ambulatory Visit
Admission: RE | Admit: 2015-07-08 | Discharge: 2015-07-08 | Disposition: A | Discharge status: Home / Self Care | Payer: Medicare Other | Source: Ambulatory Visit | Attending: Oncology | Admitting: Oncology

## 2015-07-08 ENCOUNTER — Other Ambulatory Visit: Payer: Self-pay | Admitting: Oncology

## 2015-07-08 DIAGNOSIS — C50412 Malignant neoplasm of upper-outer quadrant of left female breast: Secondary | ICD-10-CM

## 2015-07-15 ENCOUNTER — Ambulatory Visit: Payer: Medicare Other | Admitting: Podiatry

## 2015-07-30 ENCOUNTER — Other Ambulatory Visit: Payer: Self-pay | Admitting: *Deleted

## 2015-07-30 DIAGNOSIS — C50412 Malignant neoplasm of upper-outer quadrant of left female breast: Secondary | ICD-10-CM

## 2015-07-31 ENCOUNTER — Telehealth: Payer: Self-pay | Admitting: Oncology

## 2015-07-31 ENCOUNTER — Ambulatory Visit: Payer: Medicare Other | Admitting: Oncology

## 2015-07-31 ENCOUNTER — Other Ambulatory Visit: Payer: Medicare Other

## 2015-07-31 NOTE — Telephone Encounter (Signed)
Patient called to reschedule 3/9 lab/fu to 5/3. patient has new date/time.

## 2015-09-24 ENCOUNTER — Ambulatory Visit (HOSPITAL_BASED_OUTPATIENT_CLINIC_OR_DEPARTMENT_OTHER): Payer: Medicare Other | Admitting: Oncology

## 2015-09-24 ENCOUNTER — Other Ambulatory Visit (HOSPITAL_BASED_OUTPATIENT_CLINIC_OR_DEPARTMENT_OTHER): Payer: Medicare Other

## 2015-09-24 ENCOUNTER — Telehealth: Payer: Self-pay | Admitting: Oncology

## 2015-09-24 VITALS — BP 118/69 | HR 107 | Temp 97.6°F | Resp 18 | Ht 65.0 in | Wt 288.3 lb

## 2015-09-24 DIAGNOSIS — C50912 Malignant neoplasm of unspecified site of left female breast: Secondary | ICD-10-CM

## 2015-09-24 DIAGNOSIS — C50412 Malignant neoplasm of upper-outer quadrant of left female breast: Secondary | ICD-10-CM

## 2015-09-24 DIAGNOSIS — Z170 Estrogen receptor positive status [ER+]: Secondary | ICD-10-CM

## 2015-09-24 LAB — CBC WITH DIFFERENTIAL/PLATELET
BASO%: 0.1 % (ref 0.0–2.0)
BASOS ABS: 0 10*3/uL (ref 0.0–0.1)
EOS%: 0.7 % (ref 0.0–7.0)
Eosinophils Absolute: 0.1 10*3/uL (ref 0.0–0.5)
HCT: 43.4 % (ref 34.8–46.6)
HGB: 14 g/dL (ref 11.6–15.9)
LYMPH#: 2 10*3/uL (ref 0.9–3.3)
LYMPH%: 27.3 % (ref 14.0–49.7)
MCH: 27.9 pg (ref 25.1–34.0)
MCHC: 32.3 g/dL (ref 31.5–36.0)
MCV: 86.6 fL (ref 79.5–101.0)
MONO#: 0.5 10*3/uL (ref 0.1–0.9)
MONO%: 6.5 % (ref 0.0–14.0)
NEUT#: 4.7 10*3/uL (ref 1.5–6.5)
NEUT%: 65.4 % (ref 38.4–76.8)
Platelets: 203 10*3/uL (ref 145–400)
RBC: 5.01 10*6/uL (ref 3.70–5.45)
RDW: 14.6 % — AB (ref 11.2–14.5)
WBC: 7.2 10*3/uL (ref 3.9–10.3)

## 2015-09-24 LAB — COMPREHENSIVE METABOLIC PANEL
ALT: 21 U/L (ref 0–55)
AST: 13 U/L (ref 5–34)
Albumin: 3.6 g/dL (ref 3.5–5.0)
Alkaline Phosphatase: 90 U/L (ref 40–150)
Anion Gap: 11 mEq/L (ref 3–11)
BUN: 20.3 mg/dL (ref 7.0–26.0)
CHLORIDE: 104 meq/L (ref 98–109)
CO2: 26 meq/L (ref 22–29)
CREATININE: 1.2 mg/dL — AB (ref 0.6–1.1)
Calcium: 10.1 mg/dL (ref 8.4–10.4)
EGFR: 54 mL/min/{1.73_m2} — ABNORMAL LOW (ref >90)
GLUCOSE: 173 mg/dL — AB (ref 70–140)
POTASSIUM: 4.4 meq/L (ref 3.5–5.1)
SODIUM: 141 meq/L (ref 136–145)
Total Bilirubin: 0.45 mg/dL (ref 0.20–1.20)
Total Protein: 7.4 g/dL (ref 6.4–8.3)

## 2015-09-24 MED ORDER — EXEMESTANE 25 MG PO TABS: 25.0000 mg | ORAL_TABLET | Freq: Every day | ORAL | Status: DC

## 2015-09-24 NOTE — Progress Notes (Signed)
[name]  [phone] [phone]     ID: [name] OB: 1940/06/16  MR#: [id number]  DIY#:[id number]  PCP: [name]., MD GYN:  [name] SU: [name] OTHER MD:  [name], [name] supple  CHIEF COMPLAINT: Early stage breast cancer  TREATMENT: Exemestane  BREAST CANCER HISTORY: From [name]'s initial consult note 01/29/2013:  "[name] is a 75 y.o. female who is seen for an initial consultation visit. The patient has had multiple medical issues recently. By report the patient proceeded with a hysterectomy/BSO for endometrial cancer recently at [facility]. The patient did have some difficulty recovering from this and was in a skilled nursing facility for a short period of time. She then thought that she felt a possible mass within the left breast and she sought further evaluation for this. The patient proceeded with a mammogram and ultrasound which corresponded to suspicious findings.a core biopsy was obtained and this returned positive for invasive ductal carcinoma. Receptor studies have indicated that the tumor is ER positive, PR positive, and HER-2/neu negative. She then proceeded to undergo an MRI scan of the breasts bilaterally on 12/26/2012. This revealed a 1.9 cm irregular enhancing mass within the left breast at the 12:00 position. This corresponded to the recently diagnosed invasive mammary carcinoma.   The patient was seen by Sherry. Donne Leon and she proceeded to perform a lumpectomy and sentinel lymph node evaluation on 01/11/2013. The patient's pathology returned positive for an invasive ductal carcinoma measuring 1.9 cm. The margins were negative with the closest margin being 1 cm. The sentinel lymph nodes were negative. This therefore represented a T1 C. N0 M0 tumor. "  Her subsequent history is as detailed below  INTERVAL HISTORY: [name] returns today for followup of her estrogen  receptor positive breast cancer. She continues on exemestane, which she tolerates well. She has occasional side effects as the only side effect. She obtains it at a good price.   REVIEW OF SYSTEMS: [name] is still grieving her daughters loss. She spends a lot of time at home. His heart for her to get around and of course she depends on others to drive her. Her son does come from [name] about once a month and helps her get chores [name] but that is a strain on him she does not like to impose. She hurts in her knees and hips and shoulders and particularly her right shoulder is very limited in range of motion and painful. She feels very tired. She sleeps orally. Her vision is poor. Her ankles swell. She gets very short of breath walking stairs. She keeps a dry cough particularly this time of year. Her appetite is poor. She has stress urinary incontinence. She has pain in her back mild many joints and when walking. She feels weak and numb and forgetful and anxious and depressed. Hot flashes are frequent and she has cataracts. A detailed review of systems was otherwise stable   PAST MEDICAL HISTORY: Past Medical History  Diagnosis Date  . Hyperlipidemia   . Hypertension   . Cold     just started 01/02/13 cough  . Complication of anesthesia     very phobic about needles.last surgery had to sedate before doing iv  . Exertional shortness of breath   . Uterine cancer ([place])   . Chronic lower back pain   . Depression   . Arthritis     "joints; mostly on the right side" (01/11/2013)  . Breast cancer (  [name])     "left" (01/11/2013)  . Cancer ([place])     endometrial  . Allergy   . Osteoporosis     knees   . Status post radiation therapy within last four weeks 03/19/13-05/03/13    lt breast 60.4GY    PAST SURGICAL HISTORY: Past Surgical History  Procedure Laterality Date  . Replacement total knee Bilateral 2011-2012  . Foot tendon surgery Right   . Breast lumpectomy with needle localization and  axillary sentinel lymph node bx Left 01/11/2013  . Breast biopsy Left 12/2012  . Abdominal hysterectomy  12/14/2012  . Dilation and curettage of uterus      "@ least one" (01/11/2013)  . Tubal ligation    . Cholecystectomy  1970  . Knee arthroscopy Right 1990's    "twice" (01/11/2013)  . Breast lumpectomy with needle localization and axillary sentinel lymph node bx Left 01/11/2013    Procedure: BREAST LUMPECTOMY WITH NEEDLE LOCALIZATION AND AXILLARY SENTINEL LYMPH NODE BX;  Surgeon: Sherry Bookbinder, MD;  Location: [facility];  Service: General;  Laterality: Left;    FAMILY HISTORY No family history on file.  the patient does not know the cause of death of either parent. She was a foster child and really knows very little about her family. She has some half sisters that she is aware of, but does not know them very well.   GYNECOLOGIC HISTORY:   menarche age 22, first live birth age 22, the patient is [name] P2. She went through the change of life around the age of 70. She took hormone replacement through the women's health study. 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 She is divorced, and lives by herself, with no pets. Daughter [name] unfortunately died, sudden death, cause unclear. Son [name] lives in [place] and has a Designer, jewellery in psychology. The patient has no grandchildren. She is not a Ambulance person.    ADVANCED DIRECTIVES: Not in place but the patient intends to name her son as her healthcare power of attorney. He can be reached at [phone]. At the 10/10/2013 visit the patient was given the appropriate documents to complete and notarize at her discretion   HEALTH MAINTENANCE: Social History  Substance Use Topics  . Smoking status: Never Smoker   . Smokeless tobacco: Never Used  . Alcohol Use: No     Colonoscopy:  PAP: Status post hysterectomy and bilateral salpingo-oophorectomy   Bone  density: April 2014, showed minimal osteopenia, with a T score of -1.1  Lipid panel:  Allergies  Allergen Reactions  . Iodine Rash    Current Outpatient Prescriptions  Medication Sig Dispense Refill  . acetaminophen-codeine (TYLENOL #3) 300-30 MG per tablet Take 1 tablet by mouth every 6 (six) hours as needed for pain.    Marland Kitchen buPROPion (WELLBUTRIN XL) 150 MG 24 hr tablet Take 150 mg by mouth 2 (two) times daily.     . Calcium Carbonate-Vitamin D (CALTRATE 600+D) 600-400 MG-UNIT per tablet Take 1 tablet by mouth daily.    . carisoprodol (SOMA) 350 MG tablet Take 350 mg by mouth 2 (two) times daily.     . Diclofenac Sodium (PENNSAID) 1.5 % SOLN Place 1 Bottle onto the skin daily as needed. For pain    . DULoxetine (CYMBALTA) 20 MG capsule Take 1 capsule (20 mg total) by mouth daily. 30 capsule 3  . exemestane (AROMASIN) 25 MG tablet Take 1 tablet (25  mg total) by mouth daily. 90 tablet 3  . FIBER SELECT GUMMIES PO Take 2 tablets by mouth every morning.    . gabapentin (NEURONTIN) 300 MG capsule Take 1 capsule (300 mg total) by mouth at bedtime. 90 capsule 4  . hyaluronate sodium (RADIAPLEXRX) GEL Apply 1 application topically 2 (two) times daily. Add to affected skin area after rad tx and in am after your shower/bath, just not 4 hours prior to coming for rad tx    . HYDROcodone-acetaminophen (NORCO/VICODIN) 5-325 MG per tablet Take 1 tablet by mouth every 6 (six) hours as needed for moderate pain. 30 tablet 0  . metformin (FORTAMET) 500 MG (OSM) 24 hr tablet Take 500 mg by mouth daily with breakfast.    . metoprolol succinate (TOPROL-XL) 50 MG 24 hr tablet Take 50 mg by mouth 2 (two) times daily.     . Multiple Vitamin (MULTIVITAMIN WITH MINERALS) TABS tablet Take 1 tablet by mouth daily.    . RESTASIS 0.05 % ophthalmic emulsion Place 1 drop into both eyes 2 (two) times daily.      No current facility-administered medications for this visit.    OBJECTIVE: Elderly African American woman Using  a walker Filed Vitals:   09/24/15 1416  BP: 118/69  Pulse: 107  Temp: 97.6 F (36.4 C)  Resp: 18     Body mass index is 47.98 kg/(m^2).    ECOG FS:2 - Symptomatic, <50% confined to bed  Sclerae unicteric, EOMs intact Oropharynx clear, slightly dry No cervical or supraclavicular adenopathy Lungs no rales or rhonchi Heart regular rate and rhythm Abd soft, nontender, positive bowel sounds MSK no focal spinal tenderness, limited range of motion left upper extremity Neuro: nonfocal, well oriented, appropriate affect Breasts: The right breast is unremarkable. The left breast is status post lumpectomy and radiation. There is residual hyperpigmentation. The skin is slightly thickened as frequently seen in these cases. There is no evidence of local recurrence. The left axilla is benign. Her    LAB RESULTS:  CMP     Component Value Date/Time   NA 141 11/18/2014 1126   NA 139 01/05/2013 1357   K 4.5 11/18/2014 1126   K 4.1 01/05/2013 1357   CL 102 01/05/2013 1357   CO2 26 11/18/2014 1126   CO2 26 01/05/2013 1357   GLUCOSE 132 11/18/2014 1126   GLUCOSE 145* 01/05/2013 1357   BUN 13.4 11/18/2014 1126   BUN 12 01/05/2013 1357   CREATININE 1.2* 11/18/2014 1126   CREATININE 0.95 01/11/2013 1422   CALCIUM 9.9 11/18/2014 1126   CALCIUM 9.6 01/05/2013 1357   PROT 7.0 11/18/2014 1126   PROT 7.0 03/31/2010 1115   ALBUMIN 3.6 11/18/2014 1126   ALBUMIN 3.6 03/31/2010 1115   AST 13 11/18/2014 1126   AST 21 03/31/2010 1115   ALT 18 11/18/2014 1126   ALT 21 03/31/2010 1115   ALKPHOS 83 11/18/2014 1126   ALKPHOS 71 03/31/2010 1115   BILITOT 0.49 11/18/2014 1126   BILITOT 0.7 03/31/2010 1115   GFRNONAA 59* 01/11/2013 1422   GFRAA 69* 01/11/2013 1422    I No results found for: SPEP  Lab Results  Component Value Date   WBC 5.7 11/18/2014   NEUTROABS 3.9 11/18/2014   HGB 14.3 11/18/2014   HCT 44.8 11/18/2014   MCV 84.2 11/18/2014   PLT 187 11/18/2014      Chemistry        Component Value Date/Time   NA 141 11/18/2014 1126   NA  139 01/05/2013 1357   K 4.5 11/18/2014 1126   K 4.1 01/05/2013 1357   CL 102 01/05/2013 1357   CO2 26 11/18/2014 1126   CO2 26 01/05/2013 1357   BUN 13.4 11/18/2014 1126   BUN 12 01/05/2013 1357   CREATININE 1.2* 11/18/2014 1126   CREATININE 0.95 01/11/2013 1422      Component Value Date/Time   CALCIUM 9.9 11/18/2014 1126   CALCIUM 9.6 01/05/2013 1357   ALKPHOS 83 11/18/2014 1126   ALKPHOS 71 03/31/2010 1115   AST 13 11/18/2014 1126   AST 21 03/31/2010 1115   ALT 18 11/18/2014 1126   ALT 21 03/31/2010 1115   BILITOT 0.49 11/18/2014 1126   BILITOT 0.7 03/31/2010 1115       No results found for: LABCA2  No components found for: LABCA125  No results for input(s): INR in the last 168 hours.  Urinalysis    Component Value Date/Time   COLORURINE AMBER BIOCHEMICALS MAY BE AFFECTED BY COLOR* 03/31/2010 1116   APPEARANCEUR CLOUDY* 03/31/2010 1116   LABSPEC 1.031* 03/31/2010 1116   PHURINE 5.5 03/31/2010 1116   GLUCOSEU NEGATIVE 03/31/2010 1116   HGBUR NEGATIVE 03/31/2010 1116   [place] 03/31/2010 1116   KETONESUR NEGATIVE 03/31/2010 1116   PROTEINUR NEGATIVE 03/31/2010 1116   UROBILINOGEN 0.2 03/31/2010 1116   NITRITE NEGATIVE 03/31/2010 1116   LEUKOCYTESUR  03/31/2010 1116    NEGATIVE MICROSCOPIC NOT DONE ON URINES WITH NEGATIVE PROTEIN, BLOOD, LEUKOCYTES, NITRITE, OR GLUCOSE <1000 mg/dL.    STUDIES: CLINICAL DATA: History of left lumpectomy for breast cancer 2014.  EXAM: DIGITAL DIAGNOSTIC bilateral MAMMOGRAM WITH CAD  COMPARISON: Prior exams  ACR Breast Density Category c: The breast tissue is heterogeneously dense, which may obscure small masses.  FINDINGS: Suboptimal positioning due to patient immobility and body habitus. This may decrease the sensitivity and specificity of mammography for detection of malignancy. Left lumpectomy changes are noted. Benign-appearing  calcifications associated with presumed fat necrosis within the left lumpectomy site are reidentified and without significant interval change or malignant type morphology or distribution. No new evidence for malignancy is identified in either breast.  Mammographic images were processed with CAD.  IMPRESSION: No evidence for malignancy in either breast.  RECOMMENDATION: Diagnostic mammogram is suggested in 1 year. (Code:DM-B-01Y)  I have discussed the findings and recommendations with the patient. Results were also provided in writing at the conclusion of the visit. If applicable, a reminder letter will be sent to the patient regarding the next appointment.  BI-RADS CATEGORY 2: Benign.   Electronically Signed  By: Conchita Paris M.D.  On: 07/02/2014 14:45   ASSESSMENT: 75 y.o. [place] woman status post left lumpectomy and sentinel lymph node sampling 01/11/2013 for a pT1c pN0, stage IA invasive ductal carcinoma, grade 1, estrogen receptor 100% positive, progesterone receptor 79% positive, with an MIB-1 of 17% and no HER-2 amplification.  (1) Oncotype DX score of 10 predicts an outside the breast risk of recurrence within 10 years of 7% if the patient's only systemic treatment is tamoxifen for 5 years. It also predicts no benefit from chemotherapy.  (2) the patient completed adjuvant radiation December 2014  (3) on anastrozole and January through March of 2015, discontinued because of side effects  (4) exemestane started June 2015  (5) status post robotic hysterectomy with bilateral salpingo-oophorectomy 12/14/2012 for a noninvasive grade 1 endometrial cancer   PLAN: [name] is now 3 years out from her definitive surgery with no evidence of disease recurrence. This is very  favorable.  She is tolerating the exemestane well and the plan will be to continue that drug or a total of 5 years, which will take [place] to June 2020  I think she would benefit from grief  counseling. She is agreeable. We will get hospice a call to see if we can set that up. I am also asking our chaplain to become involved with [name].  Finally I did suggest to [name] that she join a church locally and also go to senior citizens groups and generally seek any opportunity she can find to participate with others. I think that would be the very best treatment for her at this point  I'm referring her to physical therapy for work on her left frozen shoulder.  She will see me again in 6 months. She knows to call for any problems that may develop before then. Chauncey Cruel, MD   09/24/2015 2:23 PM

## 2015-09-24 NOTE — Telephone Encounter (Signed)
appt made and avs printed °

## 2015-09-25 ENCOUNTER — Encounter: Payer: Self-pay | Admitting: General Practice

## 2015-09-25 NOTE — Progress Notes (Signed)
Spiritual Care Note  Met with pt late yesterday pm for spiritual and emotional support re grief and social isolation, per referral from Dr Jana Hakim.  [name] welcomed pastoral presence, sharing frankly about her grief at her daughter's death and her lack of social support due to physical limitations and her relocation (from [place] to [place], in order to be close to dtr, who, per pt, died unexpectedly shortly thereafter).  [name] specifically named her anger at God "for taking my daughter," which is a contributing factor to her general lack of participation in a church community (her daughter's church or a new one). She welcomes hugs, noting the emotional suffering due to lack of physical touch inherent in social isolation. She also verbalized that she enjoys making people (strangers, caregivers, etc) laugh when she does have occasion for social contact; these moments of connection help sustain her.  Provided [name] programming information to promote social connection.  Normalized emotions associated with grief, illness, mobility limitation, and other challenges.  Encouraged grief support through hospice; per pt, she completed her allowed sessions for 1:1 grief counseling, but would welcome    We plan to f/u by phone and in person when she is on campus.  Will also mail handwritten note of encouragement today, including print materials about [facility] support programming.  Please also page as needs arise/circumstances change.  Thank you.  [place], [place], [facility] Pager [phone] Voicemail  [phone]

## 2015-09-30 ENCOUNTER — Ambulatory Visit: Payer: Medicare Other | Attending: Oncology | Admitting: Physical Therapy

## 2015-09-30 DIAGNOSIS — M6281 Muscle weakness (generalized): Secondary | ICD-10-CM | POA: Diagnosis present

## 2015-09-30 DIAGNOSIS — M25612 Stiffness of left shoulder, not elsewhere classified: Secondary | ICD-10-CM | POA: Diagnosis present

## 2015-09-30 DIAGNOSIS — R29898 Other symptoms and signs involving the musculoskeletal system: Secondary | ICD-10-CM | POA: Diagnosis present

## 2015-09-30 DIAGNOSIS — M25611 Stiffness of right shoulder, not elsewhere classified: Secondary | ICD-10-CM | POA: Insufficient documentation

## 2015-09-30 NOTE — Therapy (Signed)
[place], [place], [address] Phone: [phone]   Fax:  [phone]  Physical Therapy Evaluation  Patient Details  Name: [name] MRN: [id number] Date of Birth: 08-29-1940 Referring Provider: Magrinat   Encounter Date: 09/30/2015      PT End of Session - 09/30/15 1542    Visit Number 1   Number of Visits 17   Date for PT Re-Evaluation 12/05/15   PT Start Time 1500   PT Stop Time 1550   PT Time Calculation (min) 50 min   Activity Tolerance Patient tolerated treatment well   Behavior During Therapy [facility] for tasks assessed/performed      Past Medical History  Diagnosis Date  . Hyperlipidemia   . Hypertension   . Cold     just started 01/02/13 cough  . Complication of anesthesia     very phobic about needles.last surgery had to sedate before doing iv  . Exertional shortness of breath   . Uterine cancer ([place])   . Chronic lower back pain   . Depression   . Arthritis     "joints; mostly on the right side" (01/11/2013)  . Breast cancer ([place])     "left" (01/11/2013)  . Cancer ([place])     endometrial  . Allergy   . Osteoporosis     knees   . Status post radiation therapy within last four weeks 03/19/13-05/03/13    lt breast 60.4GY    Past Surgical History  Procedure Laterality Date  . Replacement total knee Bilateral 2011-2012  . Foot tendon surgery Right   . Breast lumpectomy with needle localization and axillary sentinel lymph node bx Left 01/11/2013  . Breast biopsy Left 12/2012  . Abdominal hysterectomy  12/14/2012  . Dilation and curettage of uterus      "@ least one" (01/11/2013)  . Tubal ligation    . Cholecystectomy  1970  . Knee arthroscopy Right 1990's    "twice" (01/11/2013)  . Breast lumpectomy with needle localization and axillary sentinel lymph node bx Left 01/11/2013    Procedure: BREAST LUMPECTOMY WITH NEEDLE LOCALIZATION AND AXILLARY SENTINEL LYMPH NODE BX;  Surgeon:  Rolm Bookbinder, MD;  Location: [facility];  Service: General;  Laterality: Left;    There were no vitals filed for this visit.       Subjective Assessment - 09/30/15 1511    Subjective Pt wants to get stronger.  She is limited by pain that goes down the arm. She can't use her arms enough to help her dress.  She gets fatiged    Patient Stated Goals to be able to get my clothes on better, and feel more comfortable with daily living activites  She wants to get her strength back up.   Currently in Pain? Yes   Pain Score 2    Pain Location Neck   Pain Orientation Right;Left   Pain Descriptors / Indicators Sharp   Pain Type Chronic pain   Pain Radiating Towards radiaties toward left shoulder    Pain Onset More than a month ago   Pain Frequency Constant   Aggravating Factors  worse with fatigue    Pain Relieving Factors topical creams from the drugstore    Effect of Pain on Daily Activities limits dressing and doing household chores             [facility] PT Assessment - 09/30/15 0001    Assessment   Medical Diagnosis left breast cancer  Referring Provider Magrinat    Onset Date/Surgical Date 12/22/12   Hand Dominance Right   Precautions   Precautions Fall   Restrictions   Weight Bearing Restrictions No   Balance Screen   Has the patient fallen in the past 6 months No   Has the patient had a decrease in activity level because of a fear of falling?  Yes   Is the patient reluctant to leave their home because of a fear of falling?  Yes   [place] Private residence   Living Arrangements Alone   Available Help at Discharge Neighbor;Personal care attendant;Available PRN/intermittently   Type of Home House   Home Access Stairs to enter   Entrance Stairs-Number of Steps 2   Entrance Stairs-Rails Right   Home Layout One level   [place] - 2 wheels;Cane - single point;Tub bench;Toilet riser;Grab bars - toilet   Additional Comments needs to step  up on step stools to get into bed, uses nightlights    Prior Function   Level of Independence Independent with household mobility with device;Needs assistance with ADLs;Needs assistance with homemaking   Vocation Retired   Leisure Oceanographer, 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    Skin Integrity intact    Quick DASH  84.09   Sensation   Light Touch Not tested   Coordination   Gross Motor Movements are Fluid and Coordinated No   Coordination and Movement Description limited by decreased range of motion and postural issues    Sit to Stand   Comments 3 in 30 seconds from mat in lowest potision and using hands to push up.  needs several 'rocking motions to come to stand    Posture/Postural Control   Posture/Postural Control Postural limitations   Postural Limitations Forward head;Increased lumbar lordosis;Flexed trunk   AROM   Right Shoulder Flexion 130 Degrees   Right Shoulder ABduction 70 Degrees   Right Shoulder Internal Rotation 50 Degrees   Right Shoulder External Rotation 60 Degrees   Left Shoulder Flexion 90 Degrees   Left Shoulder ABduction 47 Degrees   Left Shoulder Internal Rotation 45 Degrees   Left Shoulder External Rotation 10 Degrees   Strength   Right Shoulder Flexion 4/5   Right Shoulder ABduction 4/5   Right Shoulder External Rotation 4/5   Left Shoulder Flexion 3+/5   Left Shoulder ABduction 3+/5   Left Shoulder External Rotation 3+/5   Right Hip Flexion 4/5   Left Hip Flexion 3/5   Right Knee Flexion 4/5   Right Knee Extension 4/5   Left Knee Flexion 3/5   Left Knee Extension 3-/5  limited by pain   Bed Mobility   Rolling Right --  pt too fearful to attempt rolling on narrow mat    Supine to Sit 3: Mod assist   Supine to Sit Details (indicate cue type and reason)  needs to pull on something to lift upper body    Sit to Supine 3: Mod assist   Sit to Supine - Details (indicate cue type and reason) needs assist to lift leg, and to control descent of upper body    Transfers   Transfers Sit to Stand;Stand to Sit   Sit to Stand 6: Modified independent (Device/Increase time)   Comments  more difficulty rising from low surface    Ambulation/Gait   Ambulation/Gait Yes   Ambulation/Gait Assistance 6: Modified independent (Device/Increase time)   Assistive device Rolling walker   Gait Pattern Step-to pattern;Decreased weight shift to right;Decreased weight shift to left;Right flexed knee in stance;Left flexed knee in stance;Trunk flexed   Gait velocity slow               Quick Dash - 09/30/15 0001    Open a tight or new jar Unable   Do heavy household chores (wash walls, wash floors) Unable   Carry a shopping bag or briefcase Unable   Wash your back Unable   Use a knife to cut food Mild difficulty   Recreational activities in which you take some force or impact through your arm, shoulder, or hand (golf, hammering, tennis) Severe difficulty   During the past week, to what extent has your arm, shoulder or hand problem interfered with your normal social activities with family, friends, neighbors, or groups? Extremely   During the past week, to what extent has your arm, shoulder or hand problem limited your work or other regular daily activities Quite a bit   Arm, shoulder, or hand pain. Extreme   Tingling (pins and needles) in your arm, shoulder, or hand Severe   Difficulty Sleeping Severe difficulty   DASH Score 84.09 %             [facility] Adult PT Treatment/Exercise - 09/30/15 0001    Knee/Hip Exercises: Standing   Other Standing Knee Exercises standing weight shift with diagonal stance.    Shoulder Exercises: Seated   External Rotation Strengthening;Both;10 reps   Theraband Level (Shoulder External Rotation) Level 1 (Yellow)                 PT Education - 09/30/15 1736    Education provided Yes   Education Details shoulder external rotation with yellow theraband, and standing weight shift in diagonal stance with each leg forward    Person(s) Educated Patient   Methods Explanation;Demonstration   Comprehension Returned demonstration;Verbalized understanding           Short Term Clinic Goals - 09/30/15 1543    CC Short Term Goal  #1   Title perform 5 repetitions of sit to stand in 30 seconds to demonstrated overall increase strength    Baseline 3   Time 4   Period Weeks   Status New   CC Short Term Goal  #2   Title Pt will have decrease in quick DASH score by 5 points demonstraing increase in shoulder functional movement    Baseline 84.09   Time 4   Period Weeks   Status New   CC Short Term Goal  #3   Title pt reports in decrease in overall pain by 25 % allowing her to do more things at home    Time 4   Period [facility] Goals - 09/30/15 1545    CC Long Term Goal  #1   Title pt will report decrease in pain by 50%   Time 8   Period Weeks   Status New   CC Long Term Goal  #2   Title pt will increase external rotation of left arm to 40 degrees    Baseline 10 on 09/30/2015   Time 8   Period Weeks   Status New   CC  Long Term Goal  #3   Title pt will increase left shoulder abduction to 90 so that she can dress easier    Baseline 47 on 09/30/2015   Time 8   Period Weeks   Status New   CC Long Term Goal  #4   Title pt will have decrease in quick DASH by  20 points demonstrating increased shoulder function    Baseline 84.09 on 09/30/2015   Time 8   Period Weeks   Status New   CC Long Term Goal  #5   Title pt will report at least 25 % improvement in efficienc in acitiviie of daily living including in and out of bed and dressing and bathing.    Time 8   Period Weeks   Status New            Plan - 09/30/15 1552    Clinical Impression  Statement [name] is known to this clinic from previous treatment.  She continues to struggle with decreased shoulder range of motion and pain, decreased strength of left hip and knee, generalized pain and weakness with difficulty with actiities of daily living. She lived alone and is dependent on public transportation. She reports that her funcional abilities fluctuate by how much pain she is in on a given day.  It depends at time on the weather or other factors that determine how she is feeling.    Rehab Potential Good   Clinical Impairments Affecting Rehab Potential multiple skeletal problems and pain, previous cancer and treatment    PT Frequency 2x / week   PT Duration 8 weeks   PT Treatment/Interventions ADLs/Self Care Home Management;Neuromuscular re-education;Passive range of motion;Patient/family education;DME Instruction;Functional mobility training;Therapeutic activities;Moist Heat;Therapeutic exercise;Manual techniques   PT Next Visit Plan nustep, UE ranger with left and right arms for active movement Consider  standing weight shift  with ,progress to parrallel  bars with emphasis on quad activation and standing rows for core and scapular strengthening, P/ROM to shoulders    Consulted and Agree with Plan of Care Patient      Patient will benefit from skilled therapeutic intervention in order to improve the following deficits and impairments:  Decreased range of motion, Difficulty walking, Abnormal gait, Decreased endurance, Impaired UE functional use, Obesity, Pain, Impaired perceived functional ability, Decreased knowledge of precautions, Decreased activity tolerance, Postural dysfunction, Decreased strength  Visit Diagnosis: Stiffness of right shoulder, not elsewhere classified - Plan: PT plan of care cert/re-cert  Stiffness of left shoulder, not elsewhere classified - Plan: PT plan of care cert/re-cert  Muscle weakness (generalized) - Plan: PT plan of care cert/re-cert  Other  symptoms and signs involving the musculoskeletal system - Plan: PT plan of care cert/re-cert      G-Codes - [id number] [id number]    Functional Assessment Tool Used quick DASH   Functional Limitation Carrying, moving and handling objects   Carrying, Moving and Handling Objects Current Status [id number]) At least 80 percent but less than 100 percent impaired, limited or restricted   Carrying, Moving and Handling Objects Goal Status [id number]) At least 60 percent but less than 80 percent impaired, limited or restricted       Problem List Patient Active Problem List   Diagnosis Date Noted  . Pain in lower limb 01/10/2015  . Breast cancer of upper-outer quadrant of left female breast ([place]) 01/24/2013  . Edema 01/09/2013  . Onychomycosis 01/09/2013  . Pain in joint, ankle and foot 01/09/2013   Donato Heinz. Owens Shark, PT  09/30/2015, 6:00 PM  [place], [place], [address] Phone: [phone]   Fax:  [phone]  Name: [name] MRN: [id number] Date of Birth: Apr 10, 1941

## 2015-10-07 ENCOUNTER — Ambulatory Visit: Payer: Medicare Other | Admitting: Physical Therapy

## 2015-10-13 ENCOUNTER — Ambulatory Visit: Payer: Medicare Other | Admitting: Physical Therapy

## 2015-10-16 ENCOUNTER — Ambulatory Visit: Payer: Medicare Other | Admitting: Physical Therapy

## 2015-10-23 ENCOUNTER — Ambulatory Visit: Payer: Medicare Other | Attending: Oncology | Admitting: Physical Therapy

## 2015-10-23 DIAGNOSIS — M25611 Stiffness of right shoulder, not elsewhere classified: Secondary | ICD-10-CM

## 2015-10-23 DIAGNOSIS — M6281 Muscle weakness (generalized): Secondary | ICD-10-CM

## 2015-10-23 DIAGNOSIS — R29898 Other symptoms and signs involving the musculoskeletal system: Secondary | ICD-10-CM | POA: Diagnosis present

## 2015-10-23 DIAGNOSIS — M25612 Stiffness of left shoulder, not elsewhere classified: Secondary | ICD-10-CM

## 2015-10-23 NOTE — Therapy (Signed)
[place], [place], [address] Phone: [phone]   Fax:  [phone]  Physical Therapy Treatment  Patient Details  Name: [name] MRN: [id number] Date of Birth: 1940-06-09 Referring Provider: Magrinat   Encounter Date: 10/23/2015      PT End of Session - 10/23/15 1722    Visit Number 2   Number of Visits 17   Date for PT Re-Evaluation 12/05/15   PT Start Time [id number]   PT Stop Time 1705   PT Time Calculation (min) 48 min   Activity Tolerance Patient tolerated treatment well   Behavior During Therapy [facility] for tasks assessed/performed      Past Medical History  Diagnosis Date  . Hyperlipidemia   . Hypertension   . Cold     just started 01/02/13 cough  . Complication of anesthesia     very phobic about needles.last surgery had to sedate before doing iv  . Exertional shortness of breath   . Uterine cancer ([place])   . Chronic lower back pain   . Depression   . Arthritis     "joints; mostly on the right side" (01/11/2013)  . Breast cancer ([place])     "left" (01/11/2013)  . Cancer ([place])     endometrial  . Allergy   . Osteoporosis     knees   . Status post radiation therapy within last four weeks 03/19/13-05/03/13    lt breast 60.4GY    Past Surgical History  Procedure Laterality Date  . Replacement total knee Bilateral 2011-2012  . Foot tendon surgery Right   . Breast lumpectomy with needle localization and axillary sentinel lymph node bx Left 01/11/2013  . Breast biopsy Left 12/2012  . Abdominal hysterectomy  12/14/2012  . Dilation and curettage of uterus      "@ least one" (01/11/2013)  . Tubal ligation    . Cholecystectomy  1970  . Knee arthroscopy Right 1990's    "twice" (01/11/2013)  . Breast lumpectomy with needle localization and axillary sentinel lymph node bx Left 01/11/2013    Procedure: BREAST LUMPECTOMY WITH NEEDLE LOCALIZATION AND AXILLARY SENTINEL LYMPH NODE BX;  Surgeon:  Rolm Bookbinder, MD;  Location: [facility];  Service: General;  Laterality: Left;    There were no vitals filed for this visit.      Subjective Assessment - 10/23/15 1651    Subjective Pt arrived 15 minutes late due to public transportation. She has an aide come M-W-F for 2-3 hours. They help with household chores but patient continues to look for someone else to help out more.    Pertinent History 2014 left breast cancer with lumpectomy and radation, abdominal surgery for hysterectomy in same year, both knees replaces 2010 and 2012, athritis in back and neck    Patient Stated Goals to be able to get my clothes on better, and feel more comfortable with daily living activites  She wants to get her strength back up.    Currently in Pain? Yes   Pain Score 5    Pain Location Neck   Pain Orientation Right;Left   Pain Descriptors / Indicators Sharp   Pain Type Chronic pain   Pain Radiating Towards ttowards upper part of both arms    Pain Onset More than a month ago                         [facility] Adult PT Treatment/Exercise - 10/23/15 0001  Ambulation/Gait   Ambulation/Gait Yes   Ambulation/Gait Assistance 6: Modified independent (Device/Increase time)   Assistive device Rolling walker   Gait Comments Pt observed to walk at quicker pace and for > 200 feet to get to/from parking lot and to/from nustep equipment    Knee/Hip Exercises: Aerobic   Nustep level one for  7 minutes    Knee/Hip Exercises: Seated   Long Arc Quad AROM;Both;10 reps   Marching AROM;Both;10 reps   Marching Limitations stepping onto 4 inch step    Hamstring Curl Strengthening;10 reps   Hamstring Limitations yellow therabanc    Sit to Sand 10 reps;Other (comment)  from high mat    Shoulder Exercises: Seated   Row Strengthening;Both;10 reps   Theraband Level (Shoulder Row) Level 1 (Yellow)   Protraction AROM;Both;5 reps   Other Seated Exercises UE ranger with circles to both directions, forward and  back, core foward and back, then core stablaized and shoulder only for about 3 minutes with each arm                    Short Term Clinic Goals - 09/30/15 1543    CC Short Term Goal  #1   Title perform 5 repetitions of sit to stand in 30 seconds to demonstrated overall increase strength    Baseline 3   Time 4   Period Weeks   Status New   CC Short Term Goal  #2   Title Pt will have decrease in quick DASH score by 5 points demonstraing increase in shoulder functional movement    Baseline 84.09   Time 4   Period Weeks   Status New   CC Short Term Goal  #3   Title pt reports in decrease in overall pain by 25 % allowing her to do more things at home    Time 4   Period [facility] Goals - 09/30/15 1545    CC Long Term Goal  #1   Title pt will report decrease in pain by 50%   Time 8   Period Weeks   Status New   CC Long Term Goal  #2   Title pt will increase external rotation of left arm to 40 degrees    Baseline 10 on 09/30/2015   Time 8   Period Weeks   Status New   CC Long Term Goal  #3   Title pt will increase left shoulder abduction to 90 so that she can dress easier    Baseline 47 on 09/30/2015   Time 8   Period Weeks   Status New   CC Long Term Goal  #4   Title pt will have decrease in quick DASH by  20 points demonstrating increased shoulder function    Baseline 84.09 on 09/30/2015   Time 8   Period Weeks   Status New   CC Long Term Goal  #5   Title pt will report at least 25 % improvement in efficienc in acitiviie of daily living including in and out of bed and dressing and bathing.    Time 8   Period Weeks   Status New            Plan - 10/23/15 1727    Clinical Impression Statement Patinet appears to  be moving well today.  She continues to work hard in therapy and is requires only brief  rest periods.    Rehab Potential Good   Clinical Impairments Affecting Rehab Potential multiple skeletal problems and  pain, previous cancer and treatment    PT Frequency 2x / week   PT Duration 8 weeks   PT Next Visit Plan coninue with nustep, UE ranger with left and right arms for active movement Consider  standing weight shift  with ,progress to parrallel  bars with emphasis on quad activation and standing rows for core and scapular strengthening, P/ROM to shoulders    Consulted and Agree with Plan of Care Patient      Patient will benefit from skilled therapeutic intervention in order to improve the following deficits and impairments:  Decreased range of motion, Difficulty walking, Abnormal gait, Decreased endurance, Impaired UE functional use, Obesity, Pain, Impaired perceived functional ability, Decreased knowledge of precautions, Decreased activity tolerance, Postural dysfunction, Decreased strength  Visit Diagnosis: Stiffness of left shoulder, not elsewhere classified  Stiffness of right shoulder, not elsewhere classified  Muscle weakness (generalized)  Other symptoms and signs involving the musculoskeletal system  Generalized muscle weakness     Problem List Patient Active Problem List   Diagnosis Date Noted  . Pain in lower limb 01/10/2015  . Breast cancer of upper-outer quadrant of left female breast ([place]) 01/24/2013  . Edema 01/09/2013  . Onychomycosis 01/09/2013  . Pain in joint, ankle and foot 01/09/2013   Donato Heinz. Owens Shark, PT  10/23/2015, 5:29 PM  [place], [place], [address] Phone: [phone]   Fax:  [phone]  Name: [name] MRN: [id number] Date of Birth: [birth year]

## 2015-10-28 ENCOUNTER — Ambulatory Visit: Payer: Medicare Other | Admitting: Physical Therapy

## 2015-10-28 DIAGNOSIS — M6281 Muscle weakness (generalized): Secondary | ICD-10-CM

## 2015-10-28 DIAGNOSIS — M25612 Stiffness of left shoulder, not elsewhere classified: Secondary | ICD-10-CM | POA: Diagnosis not present

## 2015-10-28 DIAGNOSIS — M25611 Stiffness of right shoulder, not elsewhere classified: Secondary | ICD-10-CM

## 2015-10-28 DIAGNOSIS — R29898 Other symptoms and signs involving the musculoskeletal system: Secondary | ICD-10-CM

## 2015-10-28 NOTE — Progress Notes (Signed)
Spiritual Care Note  Reached Ms Placeres by phone to provide f/u support.  She describes herself as "more on an even keel" emotionally now, "not depressed or drained"; "There are less negatives going on."  She used opportunity to seek normalization of feelings, particularly related experiencing anxiety at unfamiliar people and places as she ages, as well as talking to herself to process thoughts and feelings (or talking to tv, which she says she often keeps on as company/positive noise at home).  At the same time, she verbalizes appreciation for a transportation program that takes her on excursions to the grocery store (with assistance to carry her bags) and new-to-her places, such as a restaurant she looks forward to trying.  Described Spiritual Care and chaplain availability to provide further support, encouraging pt to call at any time to schedule an office appt or phone conversation.  She verbalized appreciation for call, support, accessible/welcoming tone, and note with print resources.  Please also page if needs arise/circumstances change.  Thank you.  [place], [place], [facility] Pager [phone] Voicemail [phone]

## 2015-10-28 NOTE — Therapy (Signed)
[place], [place], [address] Phone: [phone]   Fax:  [phone]  Physical Therapy Treatment  Patient Details  Name: [name] MRN: [id number] Date of Birth: 25-Jan-1941 Referring Provider: Magrinat   Encounter Date: 10/28/2015      PT End of Session - 10/28/15 1703    Visit Number 3   Number of Visits 17   Date for PT Re-Evaluation 12/05/15   PT Start Time 1600   PT Stop Time 1650   PT Time Calculation (min) 50 min   Activity Tolerance Patient tolerated treatment well   Behavior During Therapy [facility] for tasks assessed/performed      Past Medical History  Diagnosis Date  . Hyperlipidemia   . Hypertension   . Cold     just started 01/02/13 cough  . Complication of anesthesia     very phobic about needles.last surgery had to sedate before doing iv  . Exertional shortness of breath   . Uterine cancer ([place])   . Chronic lower back pain   . Depression   . Arthritis     "joints; mostly on the right side" (01/11/2013)  . Breast cancer ([place])     "left" (01/11/2013)  . Cancer ([place])     endometrial  . Allergy   . Osteoporosis     knees   . Status post radiation therapy within last four weeks 03/19/13-05/03/13    lt breast 60.4GY    Past Surgical History  Procedure Laterality Date  . Replacement total knee Bilateral 2011-2012  . Foot tendon surgery Right   . Breast lumpectomy with needle localization and axillary sentinel lymph node bx Left 01/11/2013  . Breast biopsy Left 12/2012  . Abdominal hysterectomy  12/14/2012  . Dilation and curettage of uterus      "@ least one" (01/11/2013)  . Tubal ligation    . Cholecystectomy  1970  . Knee arthroscopy Right 1990's    "twice" (01/11/2013)  . Breast lumpectomy with needle localization and axillary sentinel lymph node bx Left 01/11/2013    Procedure: BREAST LUMPECTOMY WITH NEEDLE LOCALIZATION AND AXILLARY SENTINEL LYMPH NODE BX;  Surgeon:  Rolm Bookbinder, MD;  Location: [place];  Service: General;  Laterality: Left;    There were no vitals filed for this visit.      Subjective Assessment - 10/28/15 1607    Subjective Doing OK today.    Pertinent History 2014 left breast cancer with lumpectomy and radation, abdominal surgery for hysterectomy in same year, both knees replaces 2010 and 2012, athritis in back and neck    Patient Stated Goals to be able to get my clothes on better, and feel more comfortable with daily living activites  She wants to get her strength back up.    Currently in Pain? Yes   Pain Score --  4.5  in shoulders and hip                          [facility] Adult PT Treatment/Exercise - 10/28/15 0001    Ambulation/Gait   Ambulation/Gait Yes   Ambulation/Gait Assistance 6: Modified independent (Device/Increase time)   Ambulation Distance (Feet) 300 Feet   Assistive device Parallel bars   Pre-Gait Activities weight shifting forward and back , 5 reps of mini lunges on each legs. 10 feet x 2 to walk backward and 10 feet marching forward    Gait Comments Pt appears to be walking taller  and with quicker pace    Lumbar Exercises: Seated   Other Seated Lumbar Exercises kegals, part way and all he way    Knee/Hip Exercises: Aerobic   Nustep level 3 for 8 minutes    Shoulder Exercises: Supine   Protraction AROM;Both;5 reps   Other Supine Exercises dowel rod to flexion x 10 reps    Other Supine Exercises 3# weight on dowel rod for bicep curls    Manual Therapy   Manual Therapy Passive ROM   Passive ROM to both shoulders, but pt had audible creptius. limitations in external rotation of both shoulders continue   Ankle Exercises: Seated   Heel Raises 10 reps   Toe Raise 10 reps                   Short Term Clinic Goals - 09/30/15 1543    CC Short Term Goal  #1   Title perform 5 repetitions of sit to stand in 30 seconds to demonstrated overall increase strength    Baseline 3   Time 4    Period Weeks   Status New   CC Short Term Goal  #2   Title Pt will have decrease in quick DASH score by 5 points demonstraing increase in shoulder functional movement    Baseline 84.09   Time 4   Period Weeks   Status New   CC Short Term Goal  #3   Title pt reports in decrease in overall pain by 25 % allowing her to do more things at home    Time 4   Period [facility] Goals - 09/30/15 1545    CC Long Term Goal  #1   Title pt will report decrease in pain by 50%   Time 8   Period Weeks   Status New   CC Long Term Goal  #2   Title pt will increase external rotation of left arm to 40 degrees    Baseline 10 on 09/30/2015   Time 8   Period Weeks   Status New   CC Long Term Goal  #3   Title pt will increase left shoulder abduction to 90 so that she can dress easier    Baseline 47 on 09/30/2015   Time 8   Period Weeks   Status New   CC Long Term Goal  #4   Title pt will have decrease in quick DASH by  20 points demonstrating increased shoulder function    Baseline 84.09 on 09/30/2015   Time 8   Period Weeks   Status New   CC Long Term Goal  #5   Title pt will report at least 25 % improvement in efficienc in acitiviie of daily living including in and out of bed and dressing and bathing.    Time 8   Period Weeks   Status New            Plan - 10/28/15 1703    Clinical Impression Statement Pt continues to improve.  she worked hard to improve posture in gait today and was able to perform advanced activities in parrallell bars. She is activating her core for better balance conrol.  She continues to have crepitus and pain with shoulder work.    Rehab Potential Good   Clinical Impairments Affecting Rehab Potential multiple skeletal problems and pain, previous cancer and treatment    PT  Frequency 2x / week   PT Treatment/Interventions ADLs/Self Care Home Management;Neuromuscular re-education;Passive range of motion;Patient/family  education;DME Instruction;Functional mobility training;Therapeutic activities;Moist Heat;Therapeutic exercise;Manual techniques   PT Next Visit Plan coninue with nustep, UE ranger with left and right arms for active movement retest for sit to stand for objective improvement and work on bed moblity on wide mat    Consulted and Agree with Plan of Care Patient      Patient will benefit from skilled therapeutic intervention in order to improve the following deficits and impairments:  Decreased range of motion, Difficulty walking, Abnormal gait, Decreased endurance, Impaired UE functional use, Obesity, Pain, Impaired perceived functional ability, Decreased knowledge of precautions, Decreased activity tolerance, Postural dysfunction, Decreased strength  Visit Diagnosis: Stiffness of left shoulder, not elsewhere classified  Stiffness of right shoulder, not elsewhere classified  Muscle weakness (generalized)  Other symptoms and signs involving the musculoskeletal system     Problem List Patient Active Problem List   Diagnosis Date Noted  . Pain in lower limb 01/10/2015  . Breast cancer of upper-outer quadrant of left female breast ([place]) 01/24/2013  . Edema 01/09/2013  . Onychomycosis 01/09/2013  . Pain in joint, ankle and foot 01/09/2013   Donato Heinz. Owens Shark PT  [name] 10/28/2015, 5:09 PM  [place], [place], [address] Phone: [phone]   Fax:  [phone]  Name: [name] MRN: [id number] Date of Birth: Nov 21, 1940

## 2015-10-30 ENCOUNTER — Ambulatory Visit: Payer: Medicare Other | Admitting: Physical Therapy

## 2015-10-30 DIAGNOSIS — M25612 Stiffness of left shoulder, not elsewhere classified: Secondary | ICD-10-CM | POA: Diagnosis not present

## 2015-10-30 DIAGNOSIS — M6281 Muscle weakness (generalized): Secondary | ICD-10-CM

## 2015-10-30 DIAGNOSIS — M25611 Stiffness of right shoulder, not elsewhere classified: Secondary | ICD-10-CM

## 2015-10-30 DIAGNOSIS — R29898 Other symptoms and signs involving the musculoskeletal system: Secondary | ICD-10-CM

## 2015-10-30 NOTE — Therapy (Signed)
[place], [place], [address] Phone: [phone]   Fax:  [phone]  Physical Therapy Treatment  Patient Details  Name: [name] MRN: [id number] Date of Birth: 1941-04-09 Referring Provider: Magrinat   Encounter Date: 10/30/2015      PT End of Session - 10/30/15 1703    Visit Number 4   Number of Visits 17   Date for PT Re-Evaluation 12/05/15   PT Start Time 1600   PT Stop Time 1645   PT Time Calculation (min) 45 min   Activity Tolerance Patient tolerated treatment well   Behavior During Therapy [facility] for tasks assessed/performed      Past Medical History  Diagnosis Date  . Hyperlipidemia   . Hypertension   . Cold     just started 01/02/13 cough  . Complication of anesthesia     very phobic about needles.last surgery had to sedate before doing iv  . Exertional shortness of breath   . Uterine cancer ([place])   . Chronic lower back pain   . Depression   . Arthritis     "joints; mostly on the right side" (01/11/2013)  . Breast cancer ([place])     "left" (01/11/2013)  . Cancer ([place])     endometrial  . Allergy   . Osteoporosis     knees   . Status post radiation therapy within last four weeks 03/19/13-05/03/13    lt breast 60.4GY    Past Surgical History  Procedure Laterality Date  . Replacement total knee Bilateral 2011-2012  . Foot tendon surgery Right   . Breast lumpectomy with needle localization and axillary sentinel lymph node bx Left 01/11/2013  . Breast biopsy Left 12/2012  . Abdominal hysterectomy  12/14/2012  . Dilation and curettage of uterus      "@ least one" (01/11/2013)  . Tubal ligation    . Cholecystectomy  1970  . Knee arthroscopy Right 1990's    "twice" (01/11/2013)  . Breast lumpectomy with needle localization and axillary sentinel lymph node bx Left 01/11/2013    Procedure: BREAST LUMPECTOMY WITH NEEDLE LOCALIZATION AND AXILLARY SENTINEL LYMPH NODE BX;  Surgeon:  Rolm Bookbinder, MD;  Location: [place];  Service: General;  Laterality: Left;    There were no vitals filed for this visit.      Subjective Assessment - 10/30/15 1609    Subjective I wasn't too bad after the last session    Pertinent History 2014 left breast cancer with lumpectomy and radation, abdominal surgery for hysterectomy in same year, both knees replaces 2010 and 2012, athritis in back and neck    Patient Stated Goals to be able to get my clothes on better, and feel more comfortable with daily living activites  She wants to get her strength back up.    Currently in Pain? Yes   Pain Score 4   4.5 all on the left side from the neck all the way down                         [facility] Adult PT Treatment/Exercise - 10/30/15 0001    Bed Mobility   Bed Mobility Rolling Right;Rolling Left;Left Sidelying to Sit;Sit to Sidelying Left   Rolling Right 3: Mod assist   Rolling Right Details (indicate cue type and reason) needed cues to push down with left foot to push hips over . Pt had some cramping in left anterior hip that was helped  by putting a pillow and blue foam pad between legs to support femur    Rolling Left 3: Mod assist   Rolling Left Details (indicate cue type and reason) verbal cues for sequence    Left Sidelying to Sit 3: Mod assist   Left Sidelying to Sit Details (indicate cue type and reason) pt with some anxiety.  she needed assist to pull up with left arm and cues to drop legs off bed   Transfers   Transfers Sit to Stand;Stand to Sit   Sit to Stand 6: Modified independent (Device/Increase time)   Comments improved after PT session    Ambulation/Gait   Ambulation/Gait Yes   Ambulation/Gait Assistance 6: Modified independent (Device/Increase time)   Ambulation Distance (Feet) 300 Feet  good speed but maintains flexed trunk/narrow base of support   Lumbar Exercises: Supine   Bridge 5 reps   Other Supine Lumbar Exercises posterior pelvic tilt   Other Supine  Lumbar Exercises attempted to straighten out one leg at a time, but pt was unable due to hip pain    Knee/Hip Exercises: Aerobic   Nustep level 3 for 9 minutes.    Knee/Hip Exercises: Sidelying   Hip ABduction AROM;Left;2 sets;10 reps   Hip ABduction Limitations pt with cramping in anterior hip and keep hip in semiflexed position   Other Sidelying Knee/Hip Exercises active knee flexion with thigh supported    Shoulder Exercises: Seated   Other Seated Exercises UE ranger with circles to both directions, forward and back, core foward and back, then core stablaized and shoulder only for about 3 minutes with each arm                    Short Term Clinic Goals - 10/30/15 1706    CC Short Term Goal  #1   Title perform 5 repetitions of sit to stand in 30 seconds to demonstrated overall increase strength    Status On-going   CC Short Term Goal  #2   Title Pt will have decrease in quick DASH score by 5 points demonstraing increase in shoulder functional movement    Status On-going   CC Short Term Goal  #3   Title pt reports in decrease in overall pain by 25 % allowing her to do more things at home    Status On-going   CC Short Term Goal  #4   Title pt will be able to perform a basic exercise program at home   Status On-going             [place] - 09/30/15 1545    CC Long Term Goal  #1   Title pt will report decrease in pain by 50%   Time 8   Period Weeks   Status New   CC Long Term Goal  #2   Title pt will increase external rotation of left arm to 40 degrees    Baseline 10 on 09/30/2015   Time 8   Period Weeks   Status New   CC Long Term Goal  #3   Title pt will increase left shoulder abduction to 90 so that she can dress easier    Baseline 47 on 09/30/2015   Time 8   Period Weeks   Status New   CC Long Term Goal  #4   Title pt will have decrease in quick DASH by  20 points demonstrating increased shoulder function    Baseline 84.09 on 09/30/2015  Time 8   Period Weeks   Status New   CC Long Term Goal  #5   Title pt will report at least 25 % improvement in efficienc in acitiviie of daily living including in and out of bed and dressing and bathing.    Time 8   Period Weeks   Status New            Plan - 10/30/15 1703    Clinical Impression Statement Pt continues to work hard.  she is walking better and with more functional pace, but is limited by hip flexiion tightness and cramping pain with mobility today, She was able to complete most of the actvity.    Rehab Potential Good   Clinical Impairments Affecting Rehab Potential multiple skeletal problems and pain, previous cancer and treatment    PT Frequency 2x / week   PT Duration 8 weeks   PT Next Visit Plan coninue with nustep, UE ranger with left and right arms for active movement retest for sit to stand for objective improvement and work on bed moblity on wide mat or in parrallel bars.  try to work on hip flexor stretching    Consulted and Agree with Plan of Care Patient      Patient will benefit from skilled therapeutic intervention in order to improve the following deficits and impairments:  Decreased range of motion, Difficulty walking, Abnormal gait, Decreased endurance, Impaired UE functional use, Obesity, Pain, Impaired perceived functional ability, Decreased knowledge of precautions, Decreased activity tolerance, Postural dysfunction, Decreased strength  Visit Diagnosis: Stiffness of left shoulder, not elsewhere classified  Stiffness of right shoulder, not elsewhere classified  Muscle weakness (generalized)  Other symptoms and signs involving the musculoskeletal system  Generalized muscle weakness     Problem List Patient Active Problem List   Diagnosis Date Noted  . Pain in lower limb 01/10/2015  . Breast cancer of upper-outer quadrant of left female breast ([place]) 01/24/2013  . Edema 01/09/2013  . Onychomycosis 01/09/2013  . Pain in joint, ankle and  foot 01/09/2013   Donato Heinz. Owens Shark PT  [name] 10/30/2015, 5:07 PM  [place], [place], [address] Phone: [phone]   Fax:  [phone]  Name: [name] MRN: [id number] Date of Birth: 1940/10/28

## 2015-11-04 ENCOUNTER — Ambulatory Visit: Payer: Medicare Other | Admitting: Physical Therapy

## 2015-11-04 DIAGNOSIS — M25612 Stiffness of left shoulder, not elsewhere classified: Secondary | ICD-10-CM

## 2015-11-04 DIAGNOSIS — R29898 Other symptoms and signs involving the musculoskeletal system: Secondary | ICD-10-CM

## 2015-11-04 DIAGNOSIS — M25611 Stiffness of right shoulder, not elsewhere classified: Secondary | ICD-10-CM

## 2015-11-04 DIAGNOSIS — M6281 Muscle weakness (generalized): Secondary | ICD-10-CM

## 2015-11-04 NOTE — Therapy (Signed)
[place], [place], [address] Phone: [phone]   Fax:  [phone]  Physical Therapy Treatment  Patient Details  Name: [name] MRN: [id number] Date of Birth: Nov 28, 1940 Referring Provider: Magrinat   Encounter Date: 11/04/2015      PT End of Session - 11/04/15 1658    Visit Number 5   Number of Visits 17   Date for PT Re-Evaluation 12/05/15   PT Start Time 1600   PT Stop Time 1646   PT Time Calculation (min) 46 min   Activity Tolerance Patient tolerated treatment well   Behavior During Therapy [facility] for tasks assessed/performed      Past Medical History  Diagnosis Date  . Hyperlipidemia   . Hypertension   . Cold     just started 01/02/13 cough  . Complication of anesthesia     very phobic about needles.last surgery had to sedate before doing iv  . Exertional shortness of breath   . Uterine cancer ([place])   . Chronic lower back pain   . Depression   . Arthritis     "joints; mostly on the right side" (01/11/2013)  . Breast cancer ([place])     "left" (01/11/2013)  . Cancer ([place])     endometrial  . Allergy   . Osteoporosis     knees   . Status post radiation therapy within last four weeks 03/19/13-05/03/13    lt breast 60.4GY    Past Surgical History  Procedure Laterality Date  . Replacement total knee Bilateral 2011-2012  . Foot tendon surgery Right   . Breast lumpectomy with needle localization and axillary sentinel lymph node bx Left 01/11/2013  . Breast biopsy Left 12/2012  . Abdominal hysterectomy  12/14/2012  . Dilation and curettage of uterus      "@ least one" (01/11/2013)  . Tubal ligation    . Cholecystectomy  1970  . Knee arthroscopy Right 1990's    "twice" (01/11/2013)  . Breast lumpectomy with needle localization and axillary sentinel lymph node bx Left 01/11/2013    Procedure: BREAST LUMPECTOMY WITH NEEDLE LOCALIZATION AND AXILLARY SENTINEL LYMPH NODE BX;  Surgeon:  Rolm Bookbinder, MD;  Location: [place];  Service: General;  Laterality: Left;    There were no vitals filed for this visit.      Subjective Assessment - 11/04/15 1612    Subjective Pt says she was sore after last session, but she is ok now.    Pertinent History 2014 left breast cancer with lumpectomy and radation, abdominal surgery for hysterectomy in same year, both knees replaces 2010 and 2012, athritis in back and neck    Patient Stated Goals to be able to get my clothes on better, and feel more comfortable with daily living activites  She wants to get her strength back up.    Currently in Pain? No/denies                         [facility] Adult PT Treatment/Exercise - 11/04/15 0001    Ambulation/Gait   Ambulation/Gait Yes   Ambulation/Gait Assistance 6: Modified independent (Device/Increase time)   High Level Balance   High Level Balance Activities Marching forwards;Other (comment)   High Level Balance Comments walking with wide base of support    Elbow Exercises   Elbow Flexion Strengthening;Right;Left;20 reps   Bar Weights/Barbell (Elbow Flexion) 3 lbs   Forearm Supination Strengthening;Both;15 reps   Forearm Pronation Strengthening;Both;15 reps  Lumbar Exercises: Seated   Other Seated Lumbar Exercises forward reaches with arm on green physioball, hands on walker for low back, trunk and shoulder stretch    Knee/Hip Exercises: Seated   Long Arc Quad Strengthening;Both;15 reps;Weights   Long Arc Quad Weight 3 lbs.   Other Seated Knee/Hip Exercises seated step up onto 4 inch step    Hamstring Curl Strengthening;10 reps   Hamstring Limitations red theraband x 15 reps    Shoulder Exercises: Seated   Row Strengthening;Both;10 reps;Weights   Row Weight (lbs) 3   External Rotation AROM;Both;10 reps                   Short Term Clinic Goals - 10/30/15 1706    CC Short Term Goal  #1   Title perform 5 repetitions of sit to stand in 30 seconds to  demonstrated overall increase strength    Status On-going   CC Short Term Goal  #2   Title Pt will have decrease in quick DASH score by 5 points demonstraing increase in shoulder functional movement    Status On-going   CC Short Term Goal  #3   Title pt reports in decrease in overall pain by 25 % allowing her to do more things at home    Status On-going   CC Short Term Goal  #4   Title pt will be able to perform a basic exercise program at home   Status On-going             [facility] Goals - 09/30/15 1545    [place] Term Goal  #1   Title pt will report decrease in pain by 50%   Time 8   Period Weeks   Status New   CC Long Term Goal  #2   Title pt will increase external rotation of left arm to 40 degrees    Baseline 10 on 09/30/2015   Time 8   Period Weeks   Status New   CC Long Term Goal  #3   Title pt will increase left shoulder abduction to 90 so that she can dress easier    Baseline 47 on 09/30/2015   Time 8   Period Weeks   Status New   CC Long Term Goal  #4   Title pt will have decrease in quick DASH by  20 points demonstrating increased shoulder function    Baseline 84.09 on 09/30/2015   Time 8   Period Weeks   Status New   CC Long Term Goal  #5   Title pt will report at least 25 % improvement in efficienc in acitiviie of daily living including in and out of bed and dressing and bathing.    Time 8   Period Weeks   Status New            Plan - 11/04/15 1658    Clinical Impression Statement Upgraded program to include low back stretching and problem solving to help with dressing issues. Pt continues to do well with strength training and advanced gait training but still needs occasional rest breaks    Rehab Potential Good   Clinical Impairments Affecting Rehab Potential multiple skeletal problems and pain, previous cancer and treatment    PT Next Visit Plan Assess for goals retest for sit to stand for objective improvement.  continue nustep  and work on  bed moblity on wide mat or in parrallel bars.  try to work on hip flexor stretching  Consulted and Agree with Plan of Care Patient      Patient will benefit from skilled therapeutic intervention in order to improve the following deficits and impairments:  Decreased range of motion, Difficulty walking, Abnormal gait, Decreased endurance, Impaired UE functional use, Obesity, Pain, Impaired perceived functional ability, Decreased knowledge of precautions, Decreased activity tolerance, Postural dysfunction, Decreased strength  Visit Diagnosis: Stiffness of left shoulder, not elsewhere classified  Stiffness of right shoulder, not elsewhere classified  Muscle weakness (generalized)  Other symptoms and signs involving the musculoskeletal system     Problem List Patient Active Problem List   Diagnosis Date Noted  . Pain in lower limb 01/10/2015  . Breast cancer of upper-outer quadrant of left female breast ([place]) 01/24/2013  . Edema 01/09/2013  . Onychomycosis 01/09/2013  . Pain in joint, ankle and foot 01/09/2013   Donato Heinz. Owens Shark PT   [name] 11/04/2015, [place], [place], [address] Phone: [phone]   Fax:  [phone]  Name: [name] MRN: [id number] Date of Birth: 01-04-1941

## 2015-11-06 ENCOUNTER — Ambulatory Visit: Payer: Medicare Other | Admitting: Physical Therapy

## 2015-11-06 DIAGNOSIS — M25612 Stiffness of left shoulder, not elsewhere classified: Secondary | ICD-10-CM

## 2015-11-06 DIAGNOSIS — R29898 Other symptoms and signs involving the musculoskeletal system: Secondary | ICD-10-CM

## 2015-11-06 DIAGNOSIS — M25611 Stiffness of right shoulder, not elsewhere classified: Secondary | ICD-10-CM

## 2015-11-06 DIAGNOSIS — M6281 Muscle weakness (generalized): Secondary | ICD-10-CM

## 2015-11-06 NOTE — Therapy (Signed)
[place], [place], [address] Phone: [phone]   Fax:  [phone]  Physical Therapy Treatment  Patient Details  Name: [name] MRN: [id number] Date of Birth: [birth year] Referring Provider: Magrinat   Encounter Date: 11/06/2015      PT End of Session - 11/06/15 1726    Visit Number 6   Number of Visits 17   Date for PT Re-Evaluation 12/05/15   PT Start Time 1600   PT Stop Time 1644   PT Time Calculation (min) 44 min   Activity Tolerance Patient tolerated treatment well   Behavior During Therapy [facility] for tasks assessed/performed      Past Medical History  Diagnosis Date  . Hyperlipidemia   . Hypertension   . Cold     just started 01/02/13 cough  . Complication of anesthesia     very phobic about needles.last surgery had to sedate before doing iv  . Exertional shortness of breath   . Uterine cancer ([place])   . Chronic lower back pain   . Depression   . Arthritis     "joints; mostly on the right side" (01/11/2013)  . Breast cancer ([place])     "left" (01/11/2013)  . 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 Allergy   . Osteoporosis     knees   . Status post radiation therapy within last four weeks 03/19/13-05/03/13    lt breast 60.4GY    Past Surgical History  Procedure Laterality Date  . Replacement total knee Bilateral 2011-2012  . Foot tendon surgery Right   . Breast lumpectomy with needle localization and axillary sentinel lymph node bx Left 01/11/2013  . Breast biopsy Left 12/2012  . Abdominal hysterectomy  12/14/2012  . Dilation and curettage of uterus      "@ least one" (01/11/2013)  . Tubal ligation    . Cholecystectomy  1970  . Knee arthroscopy Right 1990's    "twice" (01/11/2013)  . Breast lumpectomy with needle localization and axillary sentinel lymph node bx Left 01/11/2013    Procedure: BREAST LUMPECTOMY WITH NEEDLE LOCALIZATION AND AXILLARY SENTINEL LYMPH NODE BX;  Surgeon:  Rolm Bookbinder, MD;  Location: [place] [facility];  Service: General;  Laterality: Left;    There were no vitals filed for this visit.          [facility] PT Assessment - 11/06/15 0001    Sit to Stand   Comments 6 in 30 seconds from mat in lowest potision and using hands to push up.                Katina Dung - 11/06/15 0001    Open a tight or new jar Unable   Do heavy household chores (wash walls, wash floors) Unable   Carry a shopping bag or briefcase Severe difficulty   Wash your back Unable   Use a knife to cut food No difficulty   Recreational activities in which you take some force or impact through your arm, shoulder, or hand (golf, hammering, tennis) Unable   During the past week, to what extent has your arm, shoulder or hand problem interfered with your normal social activities with family, friends, neighbors, or groups? Extremely   During the past week, to what extent has your arm, shoulder or hand problem limited your work or other regular daily activities Quite a bit   Arm, shoulder, or hand pain. Severe   Tingling (pins and needles) in your arm, shoulder,  or hand Severe   Difficulty Sleeping Moderate difficulty   DASH Score 77.27 %               [facility] Adult PT Treatment/Exercise - 11/06/15 0001    Ambulation/Gait   Ambulation/Gait Yes   Ambulation/Gait Assistance 6: Modified independent (Device/Increase time)   Ambulation Distance (Feet) 450 Feet   Pre-Gait Activities 30 feet of knee lifts 20  side stepping. Standing and taking a velcro ball off of velcro mat with one hand while keeping on hand on the walker    Posture/Postural Control   Posture/Postural Control Postural limitations   Postural Limitations Flexed trunk   Knee/Hip Exercises: Aerobic   Nustep level 4 for 9 minutes.    Knee/Hip Exercises: Standing   Hip Abduction AROM;Both;10 reps   Ankle Exercises: Seated   Heel Raises 10 reps   Toe Raise 10 reps                   [facility]  Goals - 11/06/15 1610    CC Short Term Goal  #1   Title perform 5 repetitions of sit to stand in 30 seconds to demonstrated overall increase strength    Baseline 3 at baseline, 6 reps 11/06/2015   Status Achieved   CC Short Term Goal  #2   Title Pt will have decrease in quick DASH score by 5 points demonstraing increase in shoulder functional movement    Baseline 84.09 at baseline, 77.27 on 11/06/2015   Status Achieved   CC Short Term Goal  #3   Title pt reports in decrease in overall pain by 25 % allowing her to do more things at home    Baseline pt states her pain in "a fourth better"    Status Achieved   CC Short Term Goal  #4   Title pt will be able to perform a basic exercise program at home   [facility] Goals - 11/06/15 1725    CC Long Term Goal  #1   Title pt will report decrease in pain by 50%   Status On-going   CC Long Term Goal  #2   Title pt will increase external rotation of left arm to 40 degrees    Status On-going   CC Long Term Goal  #3   Title pt will increase left shoulder abduction to 90 so that she can dress easier    Status On-going   CC Long Term Goal  #4   Title pt will have decrease in quick DASH by  20 points demonstrating increased shoulder function    Status On-going   CC Long Term Goal  #5   Title pt will report at least 25 % improvement in efficienc in acitiviie of daily living including in and out of bed and dressing and bathing.    Status On-going            Plan - 11/06/15 1727    Clinical Impression Statement performed goal assessment.  Pt has achieved all short term goals.  Will now focus on UE range of motion and ADL improvment  and long term goals    Rehab Potential Good   Clinical Impairments Affecting Rehab Potential multiple skeletal problems and pain, previous cancer and treatment    PT Frequency 2x / week   PT Treatment/Interventions ADLs/Self Care Home Management;Neuromuscular  re-education;Passive range of motion;Patient/family education;DME Instruction;Functional  mobility training;Therapeutic activities;Moist Heat;Therapeutic exercise;Manual techniques   PT Next Visit Plan   continue nustep  and work on bed moblity on wide mat beging UE program       Patient will benefit from skilled therapeutic intervention in order to improve the following deficits and impairments:  Decreased range of motion, Difficulty walking, Abnormal gait, Decreased endurance, Impaired UE functional use, Obesity, Pain, Impaired perceived functional ability, Decreased knowledge of precautions, Decreased activity tolerance, Postural dysfunction, Decreased strength  Visit Diagnosis: Stiffness of left shoulder, not elsewhere classified  Stiffness of right shoulder, not elsewhere classified  Muscle weakness (generalized)  Other symptoms and signs involving the musculoskeletal system     Problem List Patient Active Problem List   Diagnosis Date Noted  . Pain in lower limb 01/10/2015  . Breast cancer of upper-outer quadrant of left female breast ([place]) 01/24/2013  . Edema 01/09/2013  . Onychomycosis 01/09/2013  . Pain in joint, ankle and foot 01/09/2013   Donato Heinz. Owens Shark, PT  11/06/2015, 5:31 PM  [place], [place], [address] Phone: [phone]   Fax:  [phone]  Name: [name] MRN: [id number] Date of Birth: 1941/05/19

## 2015-11-11 ENCOUNTER — Ambulatory Visit: Payer: Medicare Other | Admitting: Physical Therapy

## 2015-11-11 DIAGNOSIS — R29898 Other symptoms and signs involving the musculoskeletal system: Secondary | ICD-10-CM

## 2015-11-11 DIAGNOSIS — M25611 Stiffness of right shoulder, not elsewhere classified: Secondary | ICD-10-CM

## 2015-11-11 DIAGNOSIS — M25612 Stiffness of left shoulder, not elsewhere classified: Secondary | ICD-10-CM

## 2015-11-11 DIAGNOSIS — M6281 Muscle weakness (generalized): Secondary | ICD-10-CM

## 2015-11-11 NOTE — Therapy (Signed)
[place], [place], [address] Phone: [phone]   Fax:  [phone]  Physical Therapy Treatment  Patient Details  Name: [name] MRN: [id number] Date of Birth: 1941-04-25 Referring Provider: Magrinat   Encounter Date: 11/11/2015      PT End of Session - 11/11/15 1722    Visit Number 7   Number of Visits 17   Date for PT Re-Evaluation 12/05/15   PT Start Time [id number]   PT Stop Time 1601   PT Time Calculation (min) 46 min   Activity Tolerance Patient tolerated treatment well   Behavior During Therapy [facility] for tasks assessed/performed      Past Medical History  Diagnosis Date  . Hyperlipidemia   . Hypertension   . Cold     just started 01/02/13 cough  . Complication of anesthesia     very phobic about needles.last surgery had to sedate before doing iv  . Exertional shortness of breath   . Uterine cancer ([place])   . Chronic lower back pain   . Depression   . Arthritis     "joints; mostly on the right side" (01/11/2013)  . Breast cancer ([place])     "left" (01/11/2013)  . 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 Allergy   . Osteoporosis     knees   . Status post radiation therapy within last four weeks 03/19/13-05/03/13    lt breast 60.4GY    Past Surgical History  Procedure Laterality Date  . Replacement total knee Bilateral 2011-2012  . Foot tendon surgery Right   . Breast lumpectomy with needle localization and axillary sentinel lymph node bx Left 01/11/2013  . Breast biopsy Left 12/2012  . Abdominal hysterectomy  12/14/2012  . Dilation and curettage of uterus      "@ least one" (01/11/2013)  . Tubal ligation    . Cholecystectomy  1970  . Knee arthroscopy Right 1990's    "twice" (01/11/2013)  . Breast lumpectomy with needle localization and axillary sentinel lymph node bx Left 01/11/2013    Procedure: BREAST LUMPECTOMY WITH NEEDLE LOCALIZATION AND AXILLARY SENTINEL LYMPH NODE BX;  Surgeon:  Rolm Bookbinder, MD;  Location: [place];  Service: General;  Laterality: Left;    There were no vitals filed for this visit.      Subjective Assessment - 11/11/15 1520    Subjective "I think I overdid it last weekend"  pt was doing house work and may have lifted something too heavy.    Pertinent History 2014 left breast cancer with lumpectomy and radation, abdominal surgery for hysterectomy in same year, both knees replaces 2010 and 2012, athritis in back and neck    Patient Stated Goals to be able to get my clothes on better, and feel more comfortable with daily living activites  She wants to get her strength back up.    Currently in Pain? Yes   Pain Score 5    Pain Location Shoulder   Pain Orientation Right   Pain Descriptors / Indicators Aching   Pain Type Acute pain   Pain Onset In the past 7 days   Pain Frequency Constant                         [facility] Adult PT Treatment/Exercise - 11/11/15 0001    Knee/Hip Exercises: Aerobic   Nustep level 4 for 9 minutes.    Shoulder Exercises: Seated   Other Seated  Exercises UE ranger with circles to both directions, forward and back, core foward and back, then core stablaized and shoulder only for about 3 minutes with each arm    Other Seated Exercises free motion with 7 pounds for diagonal extension with max elevation less than 90 degrees , elbow extension    Shoulder Exercises: Standing   Other Standing Exercises free motion shoulder extension, 10 reps with each arm separately, rest, 10 reps with both arms together. 3 pounds    Shoulder Exercises: Pulleys   Flexion 2 minutes                   Short Term Clinic Goals - 11/06/15 1610    CC Short Term Goal  #1   Title perform 5 repetitions of sit to stand in 30 seconds to demonstrated overall increase strength    Baseline 3 at baseline, 6 reps 11/06/2015   Status Achieved   CC Short Term Goal  #2   Title Pt will have decrease in quick DASH score by 5 points  demonstraing increase in shoulder functional movement    Baseline 84.09 at baseline, 77.27 on 11/06/2015   Status Achieved   CC Short Term Goal  #3   Title pt reports in decrease in overall pain by 25 % allowing her to do more things at home    Baseline pt states her pain in "a fourth better"    Status Achieved   CC Short Term Goal  #4   Title pt will be able to perform a basic exercise program at home   [facility] Goals - 11/06/15 1725    CC Long Term Goal  #1   Title pt will report decrease in pain by 50%   Status On-going   CC Long Term Goal  #2   Title pt will increase external rotation of left arm to 40 degrees    Status On-going   CC Long Term Goal  #3   Title pt will increase left shoulder abduction to 90 so that she can dress easier    Status On-going   CC Long Term Goal  #4   Title pt will have decrease in quick DASH by  20 points demonstrating increased shoulder function    Status On-going   CC Long Term Goal  #5   Title pt will report at least 25 % improvement in efficienc in acitiviie of daily living including in and out of bed and dressing and bathing.    Status On-going            Plan - 11/11/15 1723    Clinical Impression Statement Upgraded to new machine to work on UE strength and core stabalizaion as well as pulley for shoulder ROM.  Pt continues to work hard throughout session    PT Treatment/Interventions ADLs/Self Care Home Management;Neuromuscular re-education;Passive range of motion;Patient/family education;DME Instruction;Functional mobility training;Therapeutic activities;Moist Heat;Therapeutic exercise;Manual techniques   PT Next Visit Plan   continue nustep  and work on bed moblity on wide mat beging UE program    Consulted and Agree with Plan of Care Patient      Patient will benefit from skilled therapeutic intervention in order to improve the following deficits and impairments:  Decreased range of motion,  Difficulty walking, Abnormal gait, Decreased endurance, Impaired UE functional use, Obesity, Pain, Impaired perceived functional ability, Decreased knowledge of precautions, Decreased activity tolerance, Postural  dysfunction, Decreased strength  Visit Diagnosis: Stiffness of left shoulder, not elsewhere classified  Stiffness of right shoulder, not elsewhere classified  Muscle weakness (generalized)  Other symptoms and signs involving the musculoskeletal system     Problem List Patient Active Problem List   Diagnosis Date Noted  . Pain in lower limb 01/10/2015  . Breast cancer of upper-outer quadrant of left female breast ([place]) 01/24/2013  . Edema 01/09/2013  . Onychomycosis 01/09/2013  . Pain in joint, ankle and foot 01/09/2013   Donato Heinz. Owens Shark PT  [name] 11/11/2015, 5:25 PM  [place], [place], [address] Phone: [phone]   Fax:  [phone]  Name: [name] MRN: [id number] Date of Birth: 1941/01/23

## 2015-11-13 ENCOUNTER — Ambulatory Visit: Payer: Medicare Other | Admitting: Physical Therapy

## 2015-11-13 DIAGNOSIS — M25612 Stiffness of left shoulder, not elsewhere classified: Secondary | ICD-10-CM

## 2015-11-13 DIAGNOSIS — M6281 Muscle weakness (generalized): Secondary | ICD-10-CM

## 2015-11-13 DIAGNOSIS — R29898 Other symptoms and signs involving the musculoskeletal system: Secondary | ICD-10-CM

## 2015-11-13 DIAGNOSIS — M25611 Stiffness of right shoulder, not elsewhere classified: Secondary | ICD-10-CM

## 2015-11-13 NOTE — Therapy (Signed)
[place], [place], [address] Phone: [phone]   Fax:  [phone]  Physical Therapy Treatment  Patient Details  Name: [name] MRN: [id number] Date of Birth: Nov 26, 1940 Referring Provider: Magrinat   Encounter Date: 11/13/2015      PT End of Session - 11/13/15 1555    Visit Number 8   Number of Visits 17   Date for PT Re-Evaluation 12/05/15   PT Start Time [id number]   PT Stop Time 1600   PT Time Calculation (min) 45 min   Activity Tolerance Patient tolerated treatment well   Behavior During Therapy [facility] for tasks assessed/performed      Past Medical History  Diagnosis Date  . Hyperlipidemia   . Hypertension   . Cold     just started 01/02/13 cough  . Complication of anesthesia     very phobic about needles.last surgery had to sedate before doing iv  . Exertional shortness of breath   . Uterine cancer ([place])   . Chronic lower back pain   . Depression   . Arthritis     "joints; mostly on the right side" (01/11/2013)  . Breast cancer ([place])     "left" (01/11/2013)  . Cancer ([place])     endometrial  . Allergy   . Osteoporosis     knees   . Status post radiation therapy within last four weeks 03/19/13-05/03/13    lt breast 60.4GY    Past Surgical History  Procedure Laterality Date  . Replacement total knee Bilateral 2011-2012  . Foot tendon surgery Right   . Breast lumpectomy with needle localization and axillary sentinel lymph node bx Left 01/11/2013  . Breast biopsy Left 12/2012  . Abdominal hysterectomy  12/14/2012  . Dilation and curettage of uterus      "@ least one" (01/11/2013)  . Tubal ligation    . Cholecystectomy  1970  . Knee arthroscopy Right 1990's    "twice" (01/11/2013)  . Breast lumpectomy with needle localization and axillary sentinel lymph node bx Left 01/11/2013    Procedure: BREAST LUMPECTOMY WITH NEEDLE LOCALIZATION AND AXILLARY SENTINEL LYMPH NODE BX;  Surgeon:  Rolm Bookbinder, MD;  Location: [facility];  Service: General;  Laterality: Left;    There were no vitals filed for this visit.      Subjective Assessment - 11/13/15 1549    Subjective Pt had some soreness in shoulders after last treatment.  Also has pain in knees and hip   Pertinent History 2014 left breast cancer with lumpectomy and radation, abdominal surgery for hysterectomy in same year, both knees replaces 2010 and 2012, athritis in back and neck    Patient Stated Goals to be able to get my clothes on better, and feel more comfortable with daily living activites  She wants to get her strength back up.    Currently in Pain? Yes   Pain Score 6    Pain Location Shoulder   Pain Orientation Right;Left                         [facility] Adult PT Treatment/Exercise - 11/13/15 0001    Lumbar Exercises: Stretches   Double Knee to Chest Stretch 10 seconds   Double Knee to Chest Stretch Limitations in sitting with forward leaning with hands on walker to stretch low back    Knee/Hip Exercises: Aerobic   Nustep level 5 for 10  minutes.    Shoulder  Exercises: Seated   Other Seated Exercises free motion in sitting for elbow extension, shoulder extension and external rotation    Shoulder Exercises: Standing   Other Standing Exercises standing on foam platform with 4 legs. while she practiced standing balance with unilateral elbow extension from free motion while holding walker with other arm                    Short Term Clinic Goals - 11/06/15 1610    CC Short Term Goal  #1   Title perform 5 repetitions of sit to stand in 30 seconds to demonstrated overall increase strength    Baseline 3 at baseline, 6 reps 11/06/2015   Status Achieved   CC Short Term Goal  #2   Title Pt will have decrease in quick DASH score by 5 points demonstraing increase in shoulder functional movement    Baseline 84.09 at baseline, 77.27 on 11/06/2015   Status Achieved   CC Short Term Goal  #3    Title pt reports in decrease in overall pain by 25 % allowing her to do more things at home    Baseline pt states her pain in "a fourth better"    Status Achieved   CC Short Term Goal  #4   Title pt will be able to perform a basic exercise program at home   [place] - 11/06/15 1725    CC Long Term Goal  #1   Title pt will report decrease in pain by 50%   Status On-going   CC Long Term Goal  #2   Title pt will increase external rotation of left arm to 40 degrees    Status On-going   CC Long Term Goal  #3   Title pt will increase left shoulder abduction to 90 so that she can dress easier    Status On-going   CC Long Term Goal  #4   Title pt will have decrease in quick DASH by  20 points demonstrating increased shoulder function    Status On-going   CC Long Term Goal  #5   Title pt will report at least 25 % improvement in efficienc in acitiviie of daily living including in and out of bed and dressing and bathing.    Status On-going            Plan - 11/13/15 1555    Clinical Impression Statement Pt is improving in standing balance with more evidence of core activation.  she is increasing time and intensitiy on the nustep, but continues to have complaints of pain. Still having difficulty with upper body dressing   Clinical Impairments Affecting Rehab Potential multiple skeletal problems and pain, previous cancer and treatment    PT Next Visit Plan   continue nustep  and work on bed moblity on wide mat beging UE program       Patient will benefit from skilled therapeutic intervention in order to improve the following deficits and impairments:  Decreased range of motion, Difficulty walking, Abnormal gait, Decreased endurance, Impaired UE functional use, Obesity, Pain, Impaired perceived functional ability, Decreased knowledge of precautions, Decreased activity tolerance, Postural dysfunction, Decreased strength  Visit  Diagnosis: Stiffness of left shoulder, not elsewhere classified  Stiffness of right shoulder, not elsewhere classified  Muscle weakness (generalized)  Other symptoms and signs involving the musculoskeletal system     Problem List Patient Active  Problem List   Diagnosis Date Noted  . Pain in lower limb 01/10/2015  . Breast cancer of upper-outer quadrant of left female breast ([place]) 01/24/2013  . Edema 01/09/2013  . Onychomycosis 01/09/2013  . Pain in joint, ankle and foot 01/09/2013   Donato Heinz. Owens Shark PT   [name] 11/13/2015, 5:16 PM  [place], [place], [address] Phone: [phone]   Fax:  [phone]  Name: LALANYA SEYFRIED MRN: [id number] Date of Birth: 04-15-1941

## 2015-11-18 ENCOUNTER — Ambulatory Visit: Payer: Medicare Other | Admitting: Physical Therapy

## 2015-11-18 DIAGNOSIS — R29898 Other symptoms and signs involving the musculoskeletal system: Secondary | ICD-10-CM

## 2015-11-18 DIAGNOSIS — M25611 Stiffness of right shoulder, not elsewhere classified: Secondary | ICD-10-CM

## 2015-11-18 DIAGNOSIS — M6281 Muscle weakness (generalized): Secondary | ICD-10-CM

## 2015-11-18 DIAGNOSIS — M25612 Stiffness of left shoulder, not elsewhere classified: Secondary | ICD-10-CM

## 2015-11-18 NOTE — Therapy (Signed)
[place], [place], [address] Phone: [phone]   Fax:  [phone]  Physical Therapy Treatment  Patient Details  Name: [name] MRN: [id number] Date of Birth: Dec 16, 1940 Referring Provider: Magrinat   Encounter Date: 11/18/2015      PT End of Session - 11/18/15 1658    Visit Number 9   Number of Visits 17   Date for PT Re-Evaluation 12/05/15   PT Start Time [id number]   PT Stop Time 1600   PT Time Calculation (min) 45 min   Activity Tolerance Patient tolerated treatment well   Behavior During Therapy [facility] for tasks assessed/performed      Past Medical History  Diagnosis Date  . Hyperlipidemia   . Hypertension   . Cold     just started 01/02/13 cough  . Complication of anesthesia     very phobic about needles.last surgery had to sedate before doing iv  . Exertional shortness of breath   . Uterine cancer ([place])   . Chronic lower back pain   . Depression   . Arthritis     "joints; mostly on the right side" (01/11/2013)  . Breast cancer ([place])     "left" (01/11/2013)  . 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 Allergy   . Osteoporosis     knees   . Status post radiation therapy within last four weeks 03/19/13-05/03/13    lt breast 60.4GY    Past Surgical History  Procedure Laterality Date  . Replacement total knee Bilateral 2011-2012  . Foot tendon surgery Right   . Breast lumpectomy with needle localization and axillary sentinel lymph node bx Left 01/11/2013  . Breast biopsy Left 12/2012  . Abdominal hysterectomy  12/14/2012  . Dilation and curettage of uterus      "@ least one" (01/11/2013)  . Tubal ligation    . Cholecystectomy  1970  . Knee arthroscopy Right 1990's    "twice" (01/11/2013)  . Breast lumpectomy with needle localization and axillary sentinel lymph node bx Left 01/11/2013    Procedure: BREAST LUMPECTOMY WITH NEEDLE LOCALIZATION AND AXILLARY SENTINEL LYMPH NODE BX;  Surgeon:  Rolm Bookbinder, MD;  Location: [place];  Service: General;  Laterality: Left;    There were no vitals filed for this visit.                       [facility] Adult PT Treatment/Exercise - 11/18/15 0001    High Level Balance   High Level Balance Activities Side stepping;Backward walking;Marching forwards;Other (comment)   High Level Balance Comments walking lunges with wide base of support with mirror for cues to keep knee over second toe for knee control    Self-Care   Self-Care Other Self-Care Comments   Other Self-Care Comments  discussed need for OT consult for instruction in ADL equipment as she likely will not regain shoulder movement above 90 degrees due to OA of shoulders.  she agrees for me to contact Dr. Jana Hakim for order request    Knee/Hip Exercises: Seated   Marching AROM;Both;10 reps   Sit to Sand 5 reps   Shoulder Exercises: Standing   Other Standing Exercises long lever arm shoulder extension with each arm unilaterlly that both arms together. with red theraband Pt also help band and therapist pulled band to challenge standing balance    Ankle Exercises: Seated   Heel Raises 10 reps   Toe Raise 10 reps  Short Term Clinic Goals - 11/06/15 1610    CC Short Term Goal  #1   Title perform 5 repetitions of sit to stand in 30 seconds to demonstrated overall increase strength    Baseline 3 at baseline, 6 reps 11/06/2015   Status Achieved   CC Short Term Goal  #2   Title Pt will have decrease in quick DASH score by 5 points demonstraing increase in shoulder functional movement    Baseline 84.09 at baseline, 77.27 on 11/06/2015   Status Achieved   CC Short Term Goal  #3   Title pt reports in decrease in overall pain by 25 % allowing her to do more things at home    Baseline pt states her pain in "a fourth better"    Status Achieved   CC Short Term Goal  #4   Title pt will be able to perform a basic exercise program at home   [facility] Goals - 11/06/15 1725    CC Long Term Goal  #1   Title pt will report decrease in pain by 50%   Status On-going   CC Long Term Goal  #2   Title pt will increase external rotation of left arm to 40 degrees    Status On-going   CC Long Term Goal  #3   Title pt will increase left shoulder abduction to 90 so that she can dress easier    Status On-going   CC Long Term Goal  #4   Title pt will have decrease in quick DASH by  20 points demonstrating increased shoulder function    Status On-going   CC Long Term Goal  #5   Title pt will report at least 25 % improvement in efficienc in acitiviie of daily living including in and out of bed and dressing and bathing.    Status On-going            Plan - 11/18/15 1658    Clinical Impression Statement good sesssion in parrallel bars for gait and balanace work. Pt will need OT to address dressing goals.  Pt improving in activity tolerance    Clinical Impairments Affecting Rehab Potential multiple skeletal problems and pain, previous cancer and treatment    PT Next Visit Plan   continue nustep  and work on bed moblity on wide mat beging UE program   Gcode next visit.  reassess goals    Consulted and Agree with Plan of Care Patient      Patient will benefit from skilled therapeutic intervention in order to improve the following deficits and impairments:  Decreased range of motion, Difficulty walking, Abnormal gait, Decreased endurance, Impaired UE functional use, Obesity, Pain, Impaired perceived functional ability, Decreased knowledge of precautions, Decreased activity tolerance, Postural dysfunction, Decreased strength  Visit Diagnosis: Stiffness of left shoulder, not elsewhere classified  Stiffness of right shoulder, not elsewhere classified  Muscle weakness (generalized)  Other symptoms and signs involving the musculoskeletal system  Generalized muscle weakness     Problem List Patient  Active Problem List   Diagnosis Date Noted  . Pain in lower limb 01/10/2015  . Breast cancer of upper-outer quadrant of left female breast ([place]) 01/24/2013  . Edema 01/09/2013  . Onychomycosis 01/09/2013  . Pain in joint, ankle and foot 01/09/2013   Donato Heinz. Owens Shark, PT  [name] 11/18/2015, 5:00 PM  [place]  [place], [place], [address] Phone: [phone]   Fax:  [phone]  Name: MERRILEE HERMS MRN: [id number] Date of Birth: 10-26-1940

## 2015-11-20 ENCOUNTER — Encounter: Payer: Medicare Other | Admitting: Physical Therapy

## 2015-11-25 ENCOUNTER — Other Ambulatory Visit: Payer: Self-pay | Admitting: Oncology

## 2015-11-25 DIAGNOSIS — C50412 Malignant neoplasm of upper-outer quadrant of left female breast: Secondary | ICD-10-CM

## 2015-11-27 ENCOUNTER — Ambulatory Visit: Payer: Medicare Other | Attending: Oncology | Admitting: Physical Therapy

## 2015-11-27 DIAGNOSIS — R29898 Other symptoms and signs involving the musculoskeletal system: Secondary | ICD-10-CM | POA: Insufficient documentation

## 2015-11-27 DIAGNOSIS — M25611 Stiffness of right shoulder, not elsewhere classified: Secondary | ICD-10-CM

## 2015-11-27 DIAGNOSIS — M6281 Muscle weakness (generalized): Secondary | ICD-10-CM

## 2015-11-27 DIAGNOSIS — M25612 Stiffness of left shoulder, not elsewhere classified: Secondary | ICD-10-CM | POA: Insufficient documentation

## 2015-11-27 NOTE — Therapy (Addendum)
[place], [place], [address] Phone: [phone]   Fax:  [phone]  Physical Therapy Treatment  Patient Details  Name: [name] MRN: [id number] Date of Birth: [birth year] Referring Provider: Magrinat   Encounter Date: 11/27/2015    Past Medical History  Diagnosis Date  . Hyperlipidemia   . Hypertension   . Cold     just started 01/02/13 cough  . Complication of anesthesia     very phobic about needles.last surgery had to sedate before doing iv  . Exertional shortness of breath   . Uterine cancer ([place])   . Chronic lower back pain   . Depression   . Arthritis     "joints; mostly on the right side" (01/11/2013)  . Breast cancer ([place])     "left" (01/11/2013)  . 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 Allergy   . Osteoporosis     knees   . Status post radiation therapy within last four weeks 03/19/13-05/03/13    lt breast 60.4GY    Past Surgical History  Procedure Laterality Date  . Replacement total knee Bilateral 2011-2012  . Foot tendon surgery Right   . Breast lumpectomy with needle localization and axillary sentinel lymph node bx Left 01/11/2013  . Breast biopsy Left 12/2012  . Abdominal hysterectomy  12/14/2012  . Dilation and curettage of uterus      "@ least one" (01/11/2013)  . Tubal ligation    . Cholecystectomy  1970  . Knee arthroscopy Right 1990's    "twice" (01/11/2013)  . Breast lumpectomy with needle localization and axillary sentinel lymph node bx Left 01/11/2013    Procedure: BREAST LUMPECTOMY WITH NEEDLE LOCALIZATION AND AXILLARY SENTINEL LYMPH NODE BX;  Surgeon: Rolm Bookbinder, MD;  Location: [place];  Service: General;  Laterality: Left;    There were no vitals filed for this visit.                                  Short Term Clinic Goals - 11/06/15 1610    CC Short Term Goal  #1   Title perform 5 repetitions of sit to stand in 30 seconds to  demonstrated overall increase strength    Baseline 3 at baseline, 6 reps 11/06/2015   Status Achieved   CC Short Term Goal  #2   Title Pt will have decrease in quick DASH score by 5 points demonstraing increase in shoulder functional movement    Baseline 84.09 at baseline, 77.27 on 11/06/2015   Status Achieved   CC Short Term Goal  #3   Title pt reports in decrease in overall pain by 25 % allowing her to do more things at home    Baseline pt states her pain in "a fourth better"    Status Achieved   CC Short Term Goal  #4   Title pt will be able to perform a basic exercise program at home   [facility] Goals - 11/06/15 1725    CC Long Term Goal  #1   Title pt will report decrease in pain by 50%   Status On-going   CC Long Term Goal  #2   Title pt will increase external rotation of left arm to 40 degrees    Status On-going   CC Long Term Goal  #  3   Title pt will increase left shoulder abduction to 90 so that she can dress easier    Status On-going   CC Long Term Goal  #4   Title pt will have decrease in quick DASH by  20 points demonstrating increased shoulder function    Status On-going   CC Long Term Goal  #5   Title pt will report at least 25 % improvement in efficienc in acitiviie of daily living including in and out of bed and dressing and bathing.    Status On-going            Plan - 11/27/15 1552    Clinical Impression Statement Pt comes in today with c/o shortness of breath with short distances walking.  HR is 120 and O2 sats 90 with minimal exertion.  Her BP is 110/70. Pt has a dry cough She called her physician's office for advice. The physican will call her back.  Pt was not able to tolerate physical theapy today    PT Next Visit Plan   continue nustep  and work on bed moblity on wide mat beging UE program   Gcode next visit.  reassess goals       Patient will benefit from skilled therapeutic intervention in order to improve  the following deficits and impairments:     Visit Diagnosis: Stiffness of left shoulder, not elsewhere classified  Stiffness of right shoulder, not elsewhere classified  Muscle weakness (generalized)  Other symptoms and signs involving the musculoskeletal system     Problem List Patient Active Problem List   Diagnosis Date Noted  . Pain in lower limb 01/10/2015  . Breast cancer of upper-outer quadrant of left female breast ([place]) 01/24/2013  . Edema 01/09/2013  . Onychomycosis 01/09/2013  . Pain in joint, ankle and foot 01/09/2013   Donato Heinz. Owens Shark, PT   [name] 11/27/2015, 5:29 PM  [place], [place], [address] Phone: [phone]   Fax:  [phone]  Name: [name] MRN: [id number] Date of Birth: December 17, 1940  PHYSICAL THERAPY DISCHARGE SUMMARY  Visits from Start of Care: 9  Current functional level related to goals / functional outcomes: Unknown as pt was admitted to the hosptial    Remaining deficits: Unknown    Education / Equipment: Home exercise  Plan: Patient agrees to discharge.  Patient goals were not met. Patient is being discharged due to not returning since the last visit.  ?????     Maudry Diego, PT 04/20/16 2:56 PM

## 2015-11-29 ENCOUNTER — Emergency Department (HOSPITAL_COMMUNITY): Payer: Medicare Other

## 2015-11-29 ENCOUNTER — Inpatient Hospital Stay (HOSPITAL_COMMUNITY)
Admission: EM | Admit: 2015-11-29 | Discharge: 2015-12-09 | DRG: 175 | Disposition: A | Discharge status: Skilled Nursing Facility | Payer: Medicare Other | Attending: Internal Medicine | Admitting: Internal Medicine

## 2015-11-29 ENCOUNTER — Encounter (HOSPITAL_COMMUNITY): Payer: Self-pay

## 2015-11-29 DIAGNOSIS — F32A Depression, unspecified: Secondary | ICD-10-CM | POA: Diagnosis present

## 2015-11-29 DIAGNOSIS — G8929 Other chronic pain: Secondary | ICD-10-CM | POA: Diagnosis present

## 2015-11-29 DIAGNOSIS — N179 Acute kidney failure, unspecified: Secondary | ICD-10-CM | POA: Diagnosis not present

## 2015-11-29 DIAGNOSIS — R040 Epistaxis: Secondary | ICD-10-CM | POA: Diagnosis not present

## 2015-11-29 DIAGNOSIS — Z8542 Personal history of malignant neoplasm of other parts of uterus: Secondary | ICD-10-CM

## 2015-11-29 DIAGNOSIS — Z96653 Presence of artificial knee joint, bilateral: Secondary | ICD-10-CM | POA: Diagnosis present

## 2015-11-29 DIAGNOSIS — Z170 Estrogen receptor positive status [ER+]: Secondary | ICD-10-CM

## 2015-11-29 DIAGNOSIS — E1122 Type 2 diabetes mellitus with diabetic chronic kidney disease: Secondary | ICD-10-CM | POA: Diagnosis present

## 2015-11-29 DIAGNOSIS — E669 Obesity, unspecified: Secondary | ICD-10-CM

## 2015-11-29 DIAGNOSIS — I2699 Other pulmonary embolism without acute cor pulmonale: Secondary | ICD-10-CM | POA: Diagnosis present

## 2015-11-29 DIAGNOSIS — I82431 Acute embolism and thrombosis of right popliteal vein: Secondary | ICD-10-CM | POA: Diagnosis present

## 2015-11-29 DIAGNOSIS — I129 Hypertensive chronic kidney disease with stage 1 through stage 4 chronic kidney disease, or unspecified chronic kidney disease: Secondary | ICD-10-CM | POA: Diagnosis present

## 2015-11-29 DIAGNOSIS — I10 Essential (primary) hypertension: Secondary | ICD-10-CM | POA: Diagnosis present

## 2015-11-29 DIAGNOSIS — J9601 Acute respiratory failure with hypoxia: Secondary | ICD-10-CM

## 2015-11-29 DIAGNOSIS — E785 Hyperlipidemia, unspecified: Secondary | ICD-10-CM | POA: Diagnosis present

## 2015-11-29 DIAGNOSIS — C50412 Malignant neoplasm of upper-outer quadrant of left female breast: Secondary | ICD-10-CM | POA: Diagnosis present

## 2015-11-29 DIAGNOSIS — F329 Major depressive disorder, single episode, unspecified: Secondary | ICD-10-CM | POA: Diagnosis present

## 2015-11-29 DIAGNOSIS — N181 Chronic kidney disease, stage 1: Secondary | ICD-10-CM | POA: Diagnosis present

## 2015-11-29 DIAGNOSIS — Z6841 Body Mass Index (BMI) 40.0 and over, adult: Secondary | ICD-10-CM

## 2015-11-29 DIAGNOSIS — E1169 Type 2 diabetes mellitus with other specified complication: Secondary | ICD-10-CM

## 2015-11-29 DIAGNOSIS — G629 Polyneuropathy, unspecified: Secondary | ICD-10-CM | POA: Diagnosis present

## 2015-11-29 DIAGNOSIS — Z853 Personal history of malignant neoplasm of breast: Secondary | ICD-10-CM

## 2015-11-29 DIAGNOSIS — R0602 Shortness of breath: Secondary | ICD-10-CM | POA: Diagnosis not present

## 2015-11-29 DIAGNOSIS — Z79811 Long term (current) use of aromatase inhibitors: Secondary | ICD-10-CM

## 2015-11-29 DIAGNOSIS — N189 Chronic kidney disease, unspecified: Secondary | ICD-10-CM | POA: Diagnosis present

## 2015-11-29 DIAGNOSIS — M810 Age-related osteoporosis without current pathological fracture: Secondary | ICD-10-CM | POA: Diagnosis present

## 2015-11-29 DIAGNOSIS — I82413 Acute embolism and thrombosis of femoral vein, bilateral: Secondary | ICD-10-CM | POA: Diagnosis present

## 2015-11-29 DIAGNOSIS — Z7984 Long term (current) use of oral hypoglycemic drugs: Secondary | ICD-10-CM

## 2015-11-29 DIAGNOSIS — I2692 Saddle embolus of pulmonary artery without acute cor pulmonale: Secondary | ICD-10-CM | POA: Diagnosis not present

## 2015-11-29 DIAGNOSIS — Z923 Personal history of irradiation: Secondary | ICD-10-CM

## 2015-11-29 DIAGNOSIS — I824Z2 Acute embolism and thrombosis of unspecified deep veins of left distal lower extremity: Secondary | ICD-10-CM | POA: Diagnosis present

## 2015-11-29 DIAGNOSIS — N183 Chronic kidney disease, stage 3 (moderate): Secondary | ICD-10-CM | POA: Diagnosis present

## 2015-11-29 LAB — CBC WITH DIFFERENTIAL/PLATELET
BASOS ABS: 0 10*3/uL (ref 0.0–0.1)
Basophils Relative: 0 %
EOS PCT: 0 %
Eosinophils Absolute: 0 10*3/uL (ref 0.0–0.7)
HEMATOCRIT: 44 % (ref 36.0–46.0)
Hemoglobin: 14.2 g/dL (ref 12.0–15.0)
LYMPHS ABS: 1.3 10*3/uL (ref 0.7–4.0)
LYMPHS PCT: 16 %
MCH: 27.5 pg (ref 26.0–34.0)
MCHC: 32.3 g/dL (ref 30.0–36.0)
MCV: 85.3 fL (ref 78.0–100.0)
MONO ABS: 1 10*3/uL (ref 0.1–1.0)
Monocytes Relative: 13 %
NEUTROS ABS: 5.6 10*3/uL (ref 1.7–7.7)
Neutrophils Relative %: 71 %
PLATELETS: 136 10*3/uL — AB (ref 150–400)
RBC: 5.16 MIL/uL — AB (ref 3.87–5.11)
RDW: 14.6 % (ref 11.5–15.5)
WBC: 7.9 10*3/uL (ref 4.0–10.5)

## 2015-11-29 LAB — I-STAT TROPONIN, ED: Troponin i, poc: 0.08 ng/mL (ref 0.00–0.08)

## 2015-11-29 LAB — BASIC METABOLIC PANEL
ANION GAP: 10 (ref 5–15)
BUN: 19 mg/dL (ref 6–20)
CO2: 21 mmol/L — AB (ref 22–32)
Calcium: 9.6 mg/dL (ref 8.9–10.3)
Chloride: 107 mmol/L (ref 101–111)
Creatinine, Ser: 1.46 mg/dL — ABNORMAL HIGH (ref 0.44–1.00)
GFR calc Af Amer: 40 mL/min — ABNORMAL LOW (ref >60)
GFR, EST NON AFRICAN AMERICAN: 34 mL/min — AB (ref >60)
GLUCOSE: 156 mg/dL — AB (ref 65–99)
POTASSIUM: 4 mmol/L (ref 3.5–5.1)
Sodium: 138 mmol/L (ref 135–145)

## 2015-11-29 LAB — D-DIMER, QUANTITATIVE (NOT AT ARMC): D DIMER QUANT: 15.2 ug{FEU}/mL — AB (ref 0.00–0.50)

## 2015-11-29 MED ORDER — EXEMESTANE 25 MG PO TABS
25.0000 mg | ORAL_TABLET | Freq: Every day | ORAL | Status: DC
  Filled 2015-11-29: qty 1

## 2015-11-29 MED ORDER — FENOFIBRATE 160 MG PO TABS
160.0000 mg | ORAL_TABLET | Freq: Every day | ORAL | Status: DC
  Administered 2015-11-30 – 2015-12-09 (×10): 160 mg via ORAL
  Filled 2015-11-29 (×10): qty 1

## 2015-11-29 MED ORDER — CARISOPRODOL 350 MG PO TABS
350.0000 mg | ORAL_TABLET | Freq: Four times a day (QID) | ORAL | Status: DC
  Administered 2015-11-29 – 2015-12-09 (×37): 350 mg via ORAL
  Filled 2015-11-29 (×38): qty 1

## 2015-11-29 MED ORDER — GABAPENTIN 300 MG PO CAPS
300.0000 mg | ORAL_CAPSULE | Freq: Every day | ORAL | Status: DC
  Administered 2015-11-29 – 2015-12-08 (×10): 300 mg via ORAL
  Filled 2015-11-29 (×12): qty 1

## 2015-11-29 MED ORDER — HEPARIN (PORCINE) IN NACL 100-0.45 UNIT/ML-% IJ SOLN
1350.0000 [IU]/h | INTRAMUSCULAR | Status: AC
  Administered 2015-11-29: 1500 [IU]/h via INTRAVENOUS
  Administered 2015-11-30: 1350 [IU]/h via INTRAVENOUS
  Administered 2015-12-01: 1100 [IU]/h via INTRAVENOUS
  Administered 2015-12-02 – 2015-12-03 (×2): 1350 [IU]/h via INTRAVENOUS
  Filled 2015-11-29 (×7): qty 250

## 2015-11-29 MED ORDER — ALBUTEROL SULFATE (2.5 MG/3ML) 0.083% IN NEBU: 2.5000 mg | INHALATION_SOLUTION | RESPIRATORY_TRACT | Status: DC | PRN

## 2015-11-29 MED ORDER — PANTOPRAZOLE SODIUM 40 MG PO TBEC
40.0000 mg | DELAYED_RELEASE_TABLET | Freq: Every day | ORAL | Status: DC
  Administered 2015-11-29 – 2015-12-02 (×4): 40 mg via ORAL
  Filled 2015-11-29 (×5): qty 1

## 2015-11-29 MED ORDER — IPRATROPIUM BROMIDE 0.02 % IN SOLN: 0.5000 mg | RESPIRATORY_TRACT | Status: DC | PRN

## 2015-11-29 MED ORDER — SODIUM CHLORIDE 0.9 % IV SOLN
INTRAVENOUS | Status: DC
  Administered 2015-11-29: 75 mL/h via INTRAVENOUS
  Administered 2015-12-01: via INTRAVENOUS

## 2015-11-29 MED ORDER — HYDROCODONE-ACETAMINOPHEN 5-325 MG PO TABS
1.0000 | ORAL_TABLET | Freq: Four times a day (QID) | ORAL | Status: DC | PRN
  Administered 2015-11-29 – 2015-12-08 (×5): 1 via ORAL
  Filled 2015-11-29 (×5): qty 1

## 2015-11-29 MED ORDER — METOPROLOL SUCCINATE ER 50 MG PO TB24
50.0000 mg | ORAL_TABLET | Freq: Two times a day (BID) | ORAL | Status: DC
  Administered 2015-11-29 – 2015-12-09 (×18): 50 mg via ORAL
  Filled 2015-11-29 (×3): qty 1
  Filled 2015-11-29: qty 2
  Filled 2015-11-29 (×3): qty 1
  Filled 2015-11-29 (×2): qty 2
  Filled 2015-11-29 (×3): qty 1
  Filled 2015-11-29: qty 2
  Filled 2015-11-29 (×4): qty 1
  Filled 2015-11-29 (×2): qty 2
  Filled 2015-11-29 (×2): qty 1

## 2015-11-29 MED ORDER — FIBER COMPLETE PO TABS: 1.0000 | ORAL_TABLET | Freq: Every day | ORAL | Status: DC

## 2015-11-29 MED ORDER — HEPARIN BOLUS VIA INFUSION
5500.0000 [IU] | Freq: Once | INTRAVENOUS | Status: AC
  Administered 2015-11-29: 5500 [IU] via INTRAVENOUS
  Filled 2015-11-29: qty 5500

## 2015-11-29 MED ORDER — CYCLOSPORINE 0.05 % OP EMUL
1.0000 [drp] | Freq: Two times a day (BID) | OPHTHALMIC | Status: DC
  Administered 2015-11-29 – 2015-12-09 (×20): 1 [drp] via OPHTHALMIC
  Filled 2015-11-29 (×20): qty 1

## 2015-11-29 MED ORDER — ONDANSETRON HCL 4 MG/2ML IJ SOLN: 4.0000 mg | Freq: Four times a day (QID) | INTRAMUSCULAR | Status: DC | PRN

## 2015-11-29 MED ORDER — PANTOPRAZOLE SODIUM 40 MG PO TBEC: 40.0000 mg | DELAYED_RELEASE_TABLET | Freq: Every day | ORAL | Status: DC

## 2015-11-29 MED ORDER — BUPROPION HCL ER (SR) 150 MG PO TB12
150.0000 mg | ORAL_TABLET | Freq: Two times a day (BID) | ORAL | Status: DC
  Administered 2015-11-29 – 2015-12-09 (×20): 150 mg via ORAL
  Filled 2015-11-29 (×21): qty 1

## 2015-11-29 MED ORDER — ICY HOT 10-30 % EX STCK: 1.0000 "application " | Freq: Every day | CUTANEOUS | Status: DC | PRN

## 2015-11-29 MED ORDER — ONDANSETRON HCL 4 MG PO TABS: 4.0000 mg | ORAL_TABLET | Freq: Four times a day (QID) | ORAL | Status: DC | PRN

## 2015-11-29 MED ORDER — DICLOFENAC SODIUM 1 % TD GEL
1.0000 "application " | Freq: Four times a day (QID) | TRANSDERMAL | Status: DC
  Administered 2015-11-29 – 2015-12-09 (×25): 1 via TOPICAL
  Filled 2015-11-29 (×3): qty 100

## 2015-11-29 MED ORDER — IOPAMIDOL (ISOVUE-370) INJECTION 76%
INTRAVENOUS | Status: AC
  Administered 2015-11-29: 80 mL
  Filled 2015-11-29: qty 100

## 2015-11-29 NOTE — Progress Notes (Signed)
ANTICOAGULATION CONSULT NOTE - Initial Consult  Pharmacy Consult for heparin Indication: pulmonary embolus  Allergies  Allergen Reactions  . Iodine Rash    Patient Measurements:   Heparin Dosing Weight: 89 kg  Vital Signs: Temp: 97.5 F (36.4 C) (07/08 1423) Temp Source: Oral (07/08 1423) BP: 136/80 mmHg (07/08 1815) Pulse Rate: 128 (07/08 1815)  Labs:  Recent Labs  11/29/15 1434  HGB 14.2  HCT 44.0  PLT 136*  CREATININE 1.46*    CrCl cannot be calculated (Unknown ideal weight.).   Medical History: Past Medical History  Diagnosis Date  . Hyperlipidemia   . Hypertension   . Cold     just started 01/02/13 cough  . Complication of anesthesia     very phobic about needles.last surgery had to sedate before doing iv  . Exertional shortness of breath   . Uterine cancer ([place])   . Chronic lower back pain   . Depression   . Arthritis     "joints; mostly on the right side" (01/11/2013)  . Breast cancer ([place])     "left" (01/11/2013)  . 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 Allergy   . Osteoporosis     knees   . Status post radiation therapy within last four weeks 03/19/13-05/03/13    lt breast 60.4GY    Assessment: 56 yof to start heparin for r/o PE. CTA results pending. No AC pta. Hg wnl, plt 136, no bleed documented.  Goal of Therapy:  Heparin level 0.3-0.7 units/ml Monitor platelets by anticoagulation protocol: Yes   Plan:  Heparin 5500 unit bolus Start heparin at 1500 units/h 6h HL Daily HL/CBC Mon s/sx bleeding   Elicia Lamp, PharmD, [facility] Clinical Pharmacist Pager [phone] 11/29/2015 7:18 PM

## 2015-11-29 NOTE — H&P (Addendum)
History and Physical    [name] [id number] DOB: 01-27-1941 DOA: 11/29/2015  Referring MD/NP/PA: Dr. Eulis Foster PCP: Salena Saner., MD  Patient coming from: Home  Chief Complaint: Shortness of breath  HPI: [name] is a 75 y.o. female with medical history significant of HTN, HLD, breast Ca s/p lumpectomy /radiation, hysterectomy/BSO for endometrial cancer, and arthritis; who presents with complaints of shortness of breath. Symptoms started approximately 4 days ago with a dry cough. Thereafter 2 days later she noticed that she was having progressively worsening shortness of breath. Associated symptoms included lightheadedness, decreased oral intake, and aches and pains all over. She did not try anything particular to alleviate symptoms, but shortness of breath symptoms worsened with any kind of exertion. When she went to physical therapy that day the physical therapist stated that she did not appear to be quite her normal self. She followed up with her primary care doctor who recommended her to come be evaluated. Patient denies any recent travel, being sedentary, leg swelling, or calf pain. She has a history of breast cancer for which she is currently on exemastane.   ED Course: On admission patient was found to be afebrile, heart rates up to 135, respirations up to 27, blood pressure maintained, and O2 saturations sustaining greater than 92% on 2 L of nasal cannula oxygen. Lab work revealed elevated d-dimer of 15.2, WBC 7.9, hemoglobin 14.2, platelets 136, 19, creatinine 1.46, andglucose 56. CT angiogram showed a large burden of bilateral pulmonary emboli.  Review of Systems: As per HPI otherwise 10 point review of systems negative.   Past Medical History  Diagnosis Date  . Hyperlipidemia   . Hypertension   . Cold     just started 01/02/13 cough  . Complication of anesthesia     very phobic about needles.last surgery had to sedate before doing iv  . Exertional shortness of  breath   . Uterine cancer ([place])   . Chronic lower back pain   . Depression   . Arthritis     "joints; mostly on the right side" (01/11/2013)  . Breast cancer ([place])     "left" (01/11/2013)  . Cancer ([place])     endometrial  . Allergy   . Osteoporosis     knees   . Status post radiation therapy within last four weeks 03/19/13-05/03/13    lt breast 60.4GY    Past Surgical History  Procedure Laterality Date  . Replacement total knee Bilateral 2011-2012  . Foot tendon surgery Right   . Breast lumpectomy with needle localization and axillary sentinel lymph node bx Left 01/11/2013  . Breast biopsy Left 12/2012  . Abdominal hysterectomy  12/14/2012  . Dilation and curettage of uterus      "@ least one" (01/11/2013)  . Tubal ligation    . Cholecystectomy  1970  . Knee arthroscopy Right 1990's    "twice" (01/11/2013)  . Breast lumpectomy with needle localization and axillary sentinel lymph node bx Left 01/11/2013    Procedure: BREAST LUMPECTOMY WITH NEEDLE LOCALIZATION AND AXILLARY SENTINEL LYMPH NODE BX;  Surgeon: Rolm Bookbinder, MD;  Location: [facility];  Service: General;  Laterality: Left;     reports that she has never smoked. She has never used smokeless tobacco. She reports that she does not drink alcohol or use illicit drugs.  Allergies  Allergen Reactions  . Iodine Rash    No family history on file.  Prior to Admission medications   Medication Sig Start Date End Date  Taking? Authorizing Provider  buPROPion (WELLBUTRIN SR) 150 MG 12 hr tablet Take 150 mg by mouth 2 (two) times daily. 11/11/15  Yes Historical Provider, MD  carisoprodol (SOMA) 350 MG tablet Take 350 mg by mouth every 6 (six) hours.  12/15/12  Yes Historical Provider, MD  Diclofenac Sodium (PENNSAID) 1.5 % SOLN Place 10 drops onto the skin 4 (four) times daily. For pain   Yes Historical Provider, MD  exemestane (AROMASIN) 25 MG tablet Take 1 tablet (25 mg total) by mouth daily. 09/24/15  Yes Chauncey Cruel, MD    fenofibrate 160 MG tablet Take 160 mg by mouth daily. 10/02/15  Yes Historical Provider, MD  FIBER COMPLETE TABS Take 1 tablet by mouth daily.   Yes Historical Provider, MD  furosemide (LASIX) 20 MG tablet Take 20 mg by mouth daily. 11/11/15  Yes Historical Provider, MD  gabapentin (NEURONTIN) 300 MG capsule Take 1 capsule (300 mg total) by mouth at bedtime. 02/06/14  Yes Chauncey Cruel, MD  HYDROcodone-acetaminophen (NORCO/VICODIN) 5-325 MG per tablet Take 1 tablet by mouth every 6 (six) hours as needed for moderate pain. 08/17/13  Yes Minette Headland, NP  Menthol-Methyl Salicylate (ICY HOT) Q000111Q % STCK Apply 1 application topically daily as needed (for pain).   Yes Historical Provider, MD  metFORMIN (GLUCOPHAGE) 500 MG tablet Take 500 mg by mouth daily. 11/08/15  Yes Historical Provider, MD  metoprolol succinate (TOPROL-XL) 50 MG 24 hr tablet Take 50 mg by mouth 2 (two) times daily.  12/19/12  Yes Historical Provider, MD  Multiple Vitamin (MULTIVITAMIN WITH MINERALS) TABS tablet Take 1 tablet by mouth daily.   Yes Historical Provider, MD  RESTASIS 0.05 % ophthalmic emulsion Place 1 drop into both eyes 2 (two) times daily.  12/15/12  Yes Historical Provider, MD  DULoxetine (CYMBALTA) 20 MG capsule Take 1 capsule (20 mg total) by mouth daily. 08/17/13   Minette Headland, NP    Physical Exam:   Constitutional: Obese female in moderate respiratory distress, but able to follow commands Filed Vitals:   11/29/15 1638 11/29/15 1745 11/29/15 1815 11/29/15 1945  BP: 123/77 115/92 136/80 130/93  Pulse: 117 135 128 128  Temp:      TempSrc:      Resp: 15 27 23 26   SpO2: 94% 93% 92% 95%   Eyes: PERRL, lids and conjunctivae normal ENMT: Mucous membranes are moist. Posterior pharynx clear of any exudate or lesions.Normal dentition.  Neck: normal, supple, no masses, no thyromegaly Respiratory: Tachypneic with decreased overall aeration. Patient speaking in shortened sentences. Cardiovascular:  Tachycardic.  No extremity edema. 2+ pedal pulses. No carotid bruits.  Abdomen: no tenderness, no masses palpated. No hepatosplenomegaly. Bowel sounds positive.  Musculoskeletal: no clubbing / cyanosis. No joint deformity upper and lower extremities. Good ROM, no contractures. Normal muscle tone.  Skin: no rashes, lesions, ulcers. No induration Neurologic: CN 2-12 grossly intact. Sensation intact, DTR normal. Strength 5/5 in all 4.  Psychiatric: Normal judgment and insight. Alert and oriented x 3. Anxious mood.     Labs on Admission: I have personally reviewed following labs and imaging studies  CBC:  Recent Labs Lab 11/29/15 1434  WBC 7.9  NEUTROABS 5.6  HGB 14.2  HCT 44.0  MCV 85.3  PLT [id number]*   Basic Metabolic Panel:  Recent Labs Lab 11/29/15 1434  NA 138  K 4.0  CL 107  CO2 21*  GLUCOSE 156*  BUN 19  CREATININE 1.46*  CALCIUM 9.6   GFR: CrCl  cannot be calculated (Unknown ideal weight.). Liver Function Tests: No results for input(s): AST, ALT, ALKPHOS, BILITOT, PROT, ALBUMIN in the last 168 hours. No results for input(s): LIPASE, AMYLASE in the last 168 hours. No results for input(s): AMMONIA in the last 168 hours. Coagulation Profile: No results for input(s): INR, PROTIME in the last 168 hours. Cardiac Enzymes: No results for input(s): CKTOTAL, CKMB, CKMBINDEX, TROPONINI in the last 168 hours. BNP (last 3 results) No results for input(s): PROBNP in the last 8760 hours. HbA1C: No results for input(s): HGBA1C in the last 72 hours. CBG: No results for input(s): GLUCAP in the last 168 hours. Lipid Profile: No results for input(s): CHOL, HDL, LDLCALC, TRIG, CHOLHDL, LDLDIRECT in the last 72 hours. Thyroid Function Tests: No results for input(s): TSH, T4TOTAL, FREET4, T3FREE, THYROIDAB in the last 72 hours. Anemia Panel: No results for input(s): VITAMINB12, FOLATE, FERRITIN, TIBC, IRON, RETICCTPCT in the last 72 hours. Urine analysis:    Component Value  Date/Time   COLORURINE AMBER BIOCHEMICALS MAY BE AFFECTED BY COLOR* 03/31/2010 1116   APPEARANCEUR CLOUDY* 03/31/2010 1116   LABSPEC 1.031* 03/31/2010 1116   PHURINE 5.5 03/31/2010 1116   GLUCOSEU NEGATIVE 03/31/2010 1116   HGBUR NEGATIVE 03/31/2010 1116   [place] 03/31/2010 1116   KETONESUR NEGATIVE 03/31/2010 1116   PROTEINUR NEGATIVE 03/31/2010 1116   UROBILINOGEN 0.2 03/31/2010 1116   NITRITE NEGATIVE 03/31/2010 1116   LEUKOCYTESUR  03/31/2010 1116    NEGATIVE MICROSCOPIC NOT DONE ON URINES WITH NEGATIVE PROTEIN, BLOOD, LEUKOCYTES, NITRITE, OR GLUCOSE <1000 mg/dL.   Sepsis Labs: No results found for this or any previous visit (from the past 240 hour(s)).   Radiological Exams on Admission: Dg Chest 2 View  11/29/2015  CLINICAL DATA:  Cough beginning Tuesday, progressive shortness of breath since yesterday, history hypertension, hyperlipidemia, uterine cancer, breast cancer EXAM: CHEST  2 VIEW COMPARISON:  01/05/2013 FINDINGS: Upper normal heart size. Atherosclerotic calcification aorta. Mediastinal contours and pulmonary vascularity normal. Respiratory motion artifacts degrade diagnostic utility of lateral view. Lungs clear. No pulmonary infiltrate, pleural effusion or pneumothorax. Endplate spur formation thoracic spine. BILATERAL glenohumeral degenerative changes with degenerative disc disease changes thoracic spine. IMPRESSION: No acute abnormalities. Aortic atherosclerosis. Electronically Signed   By: Lavonia Dana M.D.   On: 11/29/2015 14:59   Ct Angio Chest Pe W/cm &/or Wo Cm  11/29/2015  CLINICAL DATA:  Cough and progressive shortness of breath. EXAM: CT ANGIOGRAPHY CHEST WITH CONTRAST TECHNIQUE: Multidetector CT imaging of the chest was performed using the standard protocol during bolus administration of intravenous contrast. Multiplanar CT image reconstructions and MIPs were obtained to evaluate the vascular anatomy. CONTRAST:  80 mL of Isovue 370 COMPARISON:  Chest x-ray  from earlier today. CT scan of the chest February 08, 2013 FINDINGS: The central airways are within normal limits. No pneumothorax identified. Scattered subsegmental atelectasis is identified. No pulmonary nodules, masses, or focal infiltrates are identified. Pulmonary emboli are identified. There is a large embolus in the left main pulmonary artery with emboli seen in the left upper and lower lobe branches. Pulmonary emboli are also seen on the right involving right upper, middle, and lower lobe branches. Embolus extends into the periphery of the right main pulmonary artery. The aorta and main pulmonary artery are similar in caliber measuring 3.7 cm. There is no definitive flattening of the intraventricular septum or deviation to the left. The right and left ventricles are similar in size. There is no reflux of contrast into the inferior vena cava.  No effusions or adenopathy. Evaluation of the upper abdomen is limited due to arterial phase imaging with no acute abnormalities seen. Visualized bones demonstrate degenerative changes in the spine but no other abnormalities. Review of the MIP images confirms the above findings. IMPRESSION: 1. There is a large burden of bilateral pulmonary emboli. Findings called to Dr. Daleen Bo Electronically Signed   By: Dorise Bullion III M.D   On: 11/29/2015 19:24    EKG: Independently reviewed. Sinus tachycardia   Assessment/Plan Bilateral pulmonary embolism: Acute. Patient with 4 day history of symptoms. CT angiogram showing bilateral pulmonary embolus with significant clot burden. Patient requiring nasal cannula oxygen to maintain O2 saturations. Risk factors include patient's history of malignancy. - Admit to stepdown - Heparin drip per pharmacy - Echocardiogram in a.m. - Will need to discuss oral anticoagulation options in a.m. Lovenox will likely not be an option as patient fears needles.   - Continuous pulse oximetry with nasal cannula oxygen keep O2 sats  greater than 90%  Acute kidney injury on chronic kidney disease stage III: Baseline kidney function.to be somewhere around 1.2., but presents with creatinine of 1.46 and BUN of 19. Patient reports decreased oral intake. - will give IV fluids in the setting of patient receiving IV contrast with CT angiogram - Repeat BMP in a.m. - Avoid nephrotoxic agents at this time  Acute hypoxic respiratory failure: O2 saturations drop into the 80s not on nasal cannula oxygen at least 2 L. - Continuous pulse oximetry with nasal cannula oxygen to keep O2 sats greater than 92% - DuoNeb's as needed for shortness of breath or wheezing  Breast cancer: Patient is status post lumpectomy, radiation, and currently taking exemestane. Patient followed by Dr. Jana Hakim. - continue exemestane  Essential hypertension - Hold furosemide  Diabetes mellitus type 2: Patient currently on oral medications of metformin. Initial blood glucose 156 - Hold metformin - CBGs every before meals with sensitive sliding scale insulin  Chronic pain/neuropathy - Continue gabapentin, Soma, hydrocodone prn  Depression  - Continue Wellbutrin  DVT prophylaxis: heparin  Code Status:  full Family Communication: No family present at bedside Disposition Plan:   Possible discharge home once medically stable and on oral anticoagulants Consults called: none Admission status: Observation  Norval Morton MD [facility] Pager [phone]  If 7PM-7AM, please contact night-coverage [email or web address] Password [email or web address]  11/29/2015, 8:05 PM

## 2015-11-29 NOTE — ED Provider Notes (Signed)
CSN: [id number]     Arrival date & time 11/29/15  1411 History   First MD Initiated Contact with Patient 11/29/15 1607     Chief Complaint  Patient presents with  . Shortness of Breath  . Cough     (Consider location/radiation/quality/duration/timing/severity/associated sxs/prior Treatment) HPI   [name] is a 75 y.o. female who presents for evaluation of cough followed by shortness of breath, onset several days ago. Cough nonproductive. No similar problems the past. No leg or back pain.   Past Medical History  Diagnosis Date  . Hyperlipidemia   . Hypertension   . Cold     just started 01/02/13 cough  . Complication of anesthesia     very phobic about needles.last surgery had to sedate before doing iv  . Exertional shortness of breath   . Uterine cancer ([place])   . Chronic lower back pain   . Depression   . Arthritis     "joints; mostly on the right side" (01/11/2013)  . Breast cancer ([place])     "left" (01/11/2013)  . Cancer ([place])     endometrial  . Allergy   . Osteoporosis     knees   . Status post radiation therapy within last four weeks 03/19/13-05/03/13    lt breast 60.4GY   Past Surgical History  Procedure Laterality Date  . Replacement total knee Bilateral 2011-2012  . Foot tendon surgery Right   . Breast lumpectomy with needle localization and axillary sentinel lymph node bx Left 01/11/2013  . Breast biopsy Left 12/2012  . Abdominal hysterectomy  12/14/2012  . Dilation and curettage of uterus      "@ least one" (01/11/2013)  . Tubal ligation    . Cholecystectomy  1970  . Knee arthroscopy Right 1990's    "twice" (01/11/2013)  . Breast lumpectomy with needle localization and axillary sentinel lymph node bx Left 01/11/2013    Procedure: BREAST LUMPECTOMY WITH NEEDLE LOCALIZATION AND AXILLARY SENTINEL LYMPH NODE BX;  Surgeon: Rolm Bookbinder, MD;  Location: [facility];  Service: General;  Laterality: Left;   No family history on file. Social History  Substance Use  Topics  . Smoking status: Never Smoker   . Smokeless tobacco: Never Used  . Alcohol Use: No   OB History    No data available     Review of Systems  All other systems reviewed and are negative.     Allergies  Iodine  Home Medications   Prior to Admission medications   Medication Sig Start Date End Date Taking? Authorizing Provider  buPROPion (WELLBUTRIN SR) 150 MG 12 hr tablet Take 150 mg by mouth 2 (two) times daily. 11/11/15  Yes Historical Provider, MD  carisoprodol (SOMA) 350 MG tablet Take 350 mg by mouth every 6 (six) hours.  12/15/12  Yes Historical Provider, MD  Diclofenac Sodium (PENNSAID) 1.5 % SOLN Place 10 drops onto the skin 4 (four) times daily. For pain   Yes Historical Provider, MD  exemestane (AROMASIN) 25 MG tablet Take 1 tablet (25 mg total) by mouth daily. 09/24/15  Yes Chauncey Cruel, MD  fenofibrate 160 MG tablet Take 160 mg by mouth daily. 10/02/15  Yes Historical Provider, MD  FIBER COMPLETE TABS Take 1 tablet by mouth daily.   Yes Historical Provider, MD  furosemide (LASIX) 20 MG tablet Take 20 mg by mouth daily. 11/11/15  Yes Historical Provider, MD  gabapentin (NEURONTIN) 300 MG capsule Take 1 capsule (300 mg total) by mouth at bedtime.  02/06/14  Yes Chauncey Cruel, MD  HYDROcodone-acetaminophen (NORCO/VICODIN) 5-325 MG per tablet Take 1 tablet by mouth every 6 (six) hours as needed for moderate pain. 08/17/13  Yes Minette Headland, NP  Menthol-Methyl Salicylate (ICY HOT) Q000111Q % STCK Apply 1 application topically daily as needed (for pain).   Yes Historical Provider, MD  metFORMIN (GLUCOPHAGE) 500 MG tablet Take 500 mg by mouth daily. 11/08/15  Yes Historical Provider, MD  metoprolol succinate (TOPROL-XL) 50 MG 24 hr tablet Take 50 mg by mouth 2 (two) times daily.  12/19/12  Yes Historical Provider, MD  Multiple Vitamin (MULTIVITAMIN WITH MINERALS) TABS tablet Take 1 tablet by mouth daily.   Yes Historical Provider, MD  RESTASIS 0.05 % ophthalmic emulsion  Place 1 drop into both eyes 2 (two) times daily.  12/15/12  Yes Historical Provider, MD  DULoxetine (CYMBALTA) 20 MG capsule Take 1 capsule (20 mg total) by mouth daily. 08/17/13   Minette Headland, NP   BP 140/85 mmHg  Pulse 106  Temp(Src) 97.5 F (36.4 C) (Oral)  Resp 26  SpO2 97% Physical Exam  Constitutional: She is oriented to person, place, and time. She appears well-developed.  Obese, Elderly  HENT:  Head: Normocephalic and atraumatic.  Right Ear: External ear normal.  Left Ear: External ear normal.  Eyes: Conjunctivae and EOM are normal. Pupils are equal, round, and reactive to light.  Neck: Normal range of motion and phonation normal. Neck supple.  Cardiovascular: Normal rate, regular rhythm and normal heart sounds.   Pulmonary/Chest: Effort normal and breath sounds normal. She exhibits no bony tenderness.  Abdominal: Soft. There is no tenderness.  Musculoskeletal: Normal range of motion.  Neurological: She is alert and oriented to person, place, and time. No cranial nerve deficit or sensory deficit. She exhibits normal muscle tone. Coordination normal.  Skin: Skin is warm, dry and intact.  Psychiatric: She has a normal mood and affect. Her behavior is normal. Judgment and thought content normal.  Nursing note and vitals reviewed.   ED Course  Procedures (including critical care time) Medications  heparin ADULT infusion 100 units/mL (25000 units/272mL sodium chloride 0.45%) (1,500 Units/hr Intravenous New Bag/Given 11/29/15 2001)  iopamidol (ISOVUE-370) 76 % injection (80 mLs  Contrast Given 11/29/15 1853)  heparin bolus via infusion 5,500 Units (5,500 Units Intravenous Given 11/29/15 2004)    Patient Vitals for the past 24 hrs:  BP Temp Temp src Pulse Resp SpO2  11/29/15 2015 140/85 mmHg - - 106 26 97 %  11/29/15 1945 130/93 mmHg - - (!) 128 26 95 %  11/29/15 1815 136/80 mmHg - - (!) 128 23 92 %  11/29/15 1745 115/92 mmHg - - (!) 135 (!) 27 93 %  11/29/15 1638 123/77  mmHg - - 117 15 94 %  11/29/15 1423 129/75 mmHg 97.5 F (36.4 C) Oral 97 20 99 %   Trial off oxygen, cause desaturation to 80% within 8 minutes. Saturation returns to normal, with 2 L nasal cannula oxygen.  8:37 PM Reevaluation with update and discussion. After initial assessment and treatment, an updated evaluation reveals she is still tachypneic and tachycardic. Dyspnea improved with oxygen.. No further complaints.Daleen Bo L   8:19 PM-Consult complete with hospitalist. Patient case explained and discussed. He agrees to admit patient for further evaluation and treatment. Call ended at 20:28  CRITICAL CARE Performed by: Richarda Blade Total critical care time: 40 minutes Critical care time was exclusive of separately billable procedures and treating other patients. Critical  care was necessary to treat or prevent imminent or life-threatening deterioration. Critical care was time spent personally by me on the following activities: development of treatment plan with patient and/or surrogate as well as nursing, discussions with consultants, evaluation of patient's response to treatment, examination of patient, obtaining history from patient or surrogate, ordering and performing treatments and interventions, ordering and review of laboratory studies, ordering and review of radiographic studies, pulse oximetry and re-evaluation of patient's condition.  Labs Review Labs Reviewed  CBC WITH DIFFERENTIAL/PLATELET - Abnormal; Notable for the following:    RBC 5.16 (*)    Platelets 136 (*)    All other components within normal limits  BASIC METABOLIC PANEL - Abnormal; Notable for the following:    CO2 21 (*)    Glucose, Bld 156 (*)    Creatinine, Ser 1.46 (*)    GFR calc non Af Amer 34 (*)    GFR calc Af Amer 40 (*)    All other components within normal limits  D-DIMER, QUANTITATIVE (NOT AT [facility]) - Abnormal; Notable for the following:    D-Dimer, Quant 15.20 (*)    All other components  within normal limits  CBC  HEPARIN LEVEL (UNFRACTIONATED)  I-STAT TROPOININ, ED    Imaging Review Dg Chest 2 View  11/29/2015  CLINICAL DATA:  Cough beginning Tuesday, progressive shortness of breath since yesterday, history hypertension, hyperlipidemia, uterine cancer, breast cancer EXAM: CHEST  2 VIEW COMPARISON:  01/05/2013 FINDINGS: Upper normal heart size. Atherosclerotic calcification aorta. Mediastinal contours and pulmonary vascularity normal. Respiratory motion artifacts degrade diagnostic utility of lateral view. Lungs clear. No pulmonary infiltrate, pleural effusion or pneumothorax. Endplate spur formation thoracic spine. BILATERAL glenohumeral degenerative changes with degenerative disc disease changes thoracic spine. IMPRESSION: No acute abnormalities. Aortic atherosclerosis. Electronically Signed   By: Lavonia Dana M.D.   On: 11/29/2015 14:59   Ct Angio Chest Pe W/cm &/or Wo Cm  11/29/2015  CLINICAL DATA:  Cough and progressive shortness of breath. EXAM: CT ANGIOGRAPHY CHEST WITH CONTRAST TECHNIQUE: Multidetector CT imaging of the chest was performed using the standard protocol during bolus administration of intravenous contrast. Multiplanar CT image reconstructions and MIPs were obtained to evaluate the vascular anatomy. CONTRAST:  80 mL of Isovue 370 COMPARISON:  Chest x-ray from earlier today. CT scan of the chest February 08, 2013 FINDINGS: The central airways are within normal limits. No pneumothorax identified. Scattered subsegmental atelectasis is identified. No pulmonary nodules, masses, or focal infiltrates are identified. Pulmonary emboli are identified. There is a large embolus in the left main pulmonary artery with emboli seen in the left upper and lower lobe branches. Pulmonary emboli are also seen on the right involving right upper, middle, and lower lobe branches. Embolus extends into the periphery of the right main pulmonary artery. The aorta and main pulmonary artery are  similar in caliber measuring 3.7 cm. There is no definitive flattening of the intraventricular septum or deviation to the left. The right and left ventricles are similar in size. There is no reflux of contrast into the inferior vena cava. No effusions or adenopathy. Evaluation of the upper abdomen is limited due to arterial phase imaging with no acute abnormalities seen. Visualized bones demonstrate degenerative changes in the spine but no other abnormalities. Review of the MIP images confirms the above findings. IMPRESSION: 1. There is a large burden of bilateral pulmonary emboli. Findings called to Dr. Daleen Bo Electronically Signed   By: Dorise Bullion III M.D   On: 11/29/2015 19:24  I have personally reviewed and evaluated these images and lab results as part of my medical decision-making.   EKG Interpretation   Date/Time:  Saturday November 29 2015 14:20:43 EDT Ventricular Rate:  115 PR Interval:  162 QRS Duration: 80 QT Interval:  308 QTC Calculation: 426 R Axis:   -23 Text Interpretation:  Sinus tachycardia with occasional Premature  ventricular complexes Low voltage QRS Cannot rule out Anterior infarct ,  age undetermined Abnormal ECG Since last tracing rate faster Confirmed by  [facility]  MD, Sugey Trevathan [phone]) on 11/29/2015 5:03:19 PM      MDM   Final diagnoses:  Saddle embolus of pulmonary artery without acute cor pulmonale, unspecified chronicity (HCC)    Dyspnea related to large bilateral pulmonary emboli. Source is not clear. Hemodynamically stable. No indication for acute thrombolysis.  Nursing Notes Reviewed/ Care Coordinated Applicable Imaging Reviewed Interpretation of Laboratory Data incorporated into ED treatment  Plan: Admit    Daleen Bo, MD 11/29/15 2043

## 2015-11-29 NOTE — ED Notes (Signed)
Admitting MD at bedside.

## 2015-11-29 NOTE — ED Notes (Addendum)
Pt refused second IV at this time. Dr. Tamala Julian aware.

## 2015-11-29 NOTE — ED Notes (Signed)
Patient here with cough that started on Tuesday and progressed to shortness of breath that started yesterday, speaking short phrases.

## 2015-11-30 DIAGNOSIS — I10 Essential (primary) hypertension: Secondary | ICD-10-CM | POA: Diagnosis not present

## 2015-11-30 DIAGNOSIS — N179 Acute kidney failure, unspecified: Secondary | ICD-10-CM | POA: Diagnosis present

## 2015-11-30 DIAGNOSIS — F32A Depression, unspecified: Secondary | ICD-10-CM | POA: Diagnosis present

## 2015-11-30 DIAGNOSIS — I82431 Acute embolism and thrombosis of right popliteal vein: Secondary | ICD-10-CM | POA: Diagnosis present

## 2015-11-30 DIAGNOSIS — Z8542 Personal history of malignant neoplasm of other parts of uterus: Secondary | ICD-10-CM | POA: Diagnosis not present

## 2015-11-30 DIAGNOSIS — E119 Type 2 diabetes mellitus without complications: Secondary | ICD-10-CM | POA: Diagnosis not present

## 2015-11-30 DIAGNOSIS — R0602 Shortness of breath: Secondary | ICD-10-CM | POA: Diagnosis present

## 2015-11-30 DIAGNOSIS — J9601 Acute respiratory failure with hypoxia: Secondary | ICD-10-CM | POA: Diagnosis present

## 2015-11-30 DIAGNOSIS — Z6841 Body Mass Index (BMI) 40.0 and over, adult: Secondary | ICD-10-CM | POA: Diagnosis not present

## 2015-11-30 DIAGNOSIS — Z7984 Long term (current) use of oral hypoglycemic drugs: Secondary | ICD-10-CM | POA: Diagnosis not present

## 2015-11-30 DIAGNOSIS — Z96653 Presence of artificial knee joint, bilateral: Secondary | ICD-10-CM | POA: Diagnosis present

## 2015-11-30 DIAGNOSIS — I824Z2 Acute embolism and thrombosis of unspecified deep veins of left distal lower extremity: Secondary | ICD-10-CM | POA: Diagnosis present

## 2015-11-30 DIAGNOSIS — G8929 Other chronic pain: Secondary | ICD-10-CM | POA: Diagnosis present

## 2015-11-30 DIAGNOSIS — G629 Polyneuropathy, unspecified: Secondary | ICD-10-CM | POA: Diagnosis present

## 2015-11-30 DIAGNOSIS — Z923 Personal history of irradiation: Secondary | ICD-10-CM | POA: Diagnosis not present

## 2015-11-30 DIAGNOSIS — I129 Hypertensive chronic kidney disease with stage 1 through stage 4 chronic kidney disease, or unspecified chronic kidney disease: Secondary | ICD-10-CM | POA: Diagnosis present

## 2015-11-30 DIAGNOSIS — M810 Age-related osteoporosis without current pathological fracture: Secondary | ICD-10-CM | POA: Diagnosis present

## 2015-11-30 DIAGNOSIS — N183 Chronic kidney disease, stage 3 (moderate): Secondary | ICD-10-CM | POA: Diagnosis present

## 2015-11-30 DIAGNOSIS — F329 Major depressive disorder, single episode, unspecified: Secondary | ICD-10-CM | POA: Diagnosis present

## 2015-11-30 DIAGNOSIS — Z79811 Long term (current) use of aromatase inhibitors: Secondary | ICD-10-CM | POA: Diagnosis not present

## 2015-11-30 DIAGNOSIS — I2699 Other pulmonary embolism without acute cor pulmonale: Secondary | ICD-10-CM | POA: Diagnosis not present

## 2015-11-30 DIAGNOSIS — Z853 Personal history of malignant neoplasm of breast: Secondary | ICD-10-CM | POA: Diagnosis not present

## 2015-11-30 DIAGNOSIS — N189 Chronic kidney disease, unspecified: Secondary | ICD-10-CM | POA: Diagnosis present

## 2015-11-30 DIAGNOSIS — E1122 Type 2 diabetes mellitus with diabetic chronic kidney disease: Secondary | ICD-10-CM | POA: Diagnosis present

## 2015-11-30 DIAGNOSIS — C50412 Malignant neoplasm of upper-outer quadrant of left female breast: Secondary | ICD-10-CM | POA: Diagnosis not present

## 2015-11-30 DIAGNOSIS — E669 Obesity, unspecified: Secondary | ICD-10-CM

## 2015-11-30 DIAGNOSIS — R040 Epistaxis: Secondary | ICD-10-CM | POA: Diagnosis not present

## 2015-11-30 DIAGNOSIS — N181 Chronic kidney disease, stage 1: Secondary | ICD-10-CM | POA: Diagnosis not present

## 2015-11-30 DIAGNOSIS — E1169 Type 2 diabetes mellitus with other specified complication: Secondary | ICD-10-CM

## 2015-11-30 DIAGNOSIS — I82413 Acute embolism and thrombosis of femoral vein, bilateral: Secondary | ICD-10-CM | POA: Diagnosis present

## 2015-11-30 DIAGNOSIS — I2692 Saddle embolus of pulmonary artery without acute cor pulmonale: Secondary | ICD-10-CM | POA: Diagnosis present

## 2015-11-30 DIAGNOSIS — E785 Hyperlipidemia, unspecified: Secondary | ICD-10-CM | POA: Diagnosis present

## 2015-11-30 LAB — MRSA PCR SCREENING: MRSA by PCR: NEGATIVE

## 2015-11-30 LAB — BASIC METABOLIC PANEL
Anion gap: 9 (ref 5–15)
BUN: 18 mg/dL (ref 6–20)
CHLORIDE: 108 mmol/L (ref 101–111)
CO2: 19 mmol/L — ABNORMAL LOW (ref 22–32)
Calcium: 9.2 mg/dL (ref 8.9–10.3)
Creatinine, Ser: 1.55 mg/dL — ABNORMAL HIGH (ref 0.44–1.00)
GFR calc Af Amer: 37 mL/min — ABNORMAL LOW (ref >60)
GFR calc non Af Amer: 32 mL/min — ABNORMAL LOW (ref >60)
GLUCOSE: 199 mg/dL — AB (ref 65–99)
POTASSIUM: 4 mmol/L (ref 3.5–5.1)
Sodium: 136 mmol/L (ref 135–145)

## 2015-11-30 LAB — CBC
HEMATOCRIT: 43.6 % (ref 36.0–46.0)
HEMOGLOBIN: 13.4 g/dL (ref 12.0–15.0)
MCH: 26.7 pg (ref 26.0–34.0)
MCHC: 30.7 g/dL (ref 30.0–36.0)
MCV: 87 fL (ref 78.0–100.0)
Platelets: 142 10*3/uL — ABNORMAL LOW (ref 150–400)
RBC: 5.01 MIL/uL (ref 3.87–5.11)
RDW: 14.9 % (ref 11.5–15.5)
WBC: 9.1 10*3/uL (ref 4.0–10.5)

## 2015-11-30 LAB — HEPARIN LEVEL (UNFRACTIONATED)
Heparin Unfractionated: 0.94 IU/mL — ABNORMAL HIGH (ref 0.30–0.70)
Heparin Unfractionated: 0.74 IU/mL — ABNORMAL HIGH (ref 0.30–0.70)
Heparin Unfractionated: 0.74 IU/mL — ABNORMAL HIGH (ref 0.30–0.70)

## 2015-11-30 MED ORDER — EXEMESTANE 25 MG PO TABS
25.0000 mg | ORAL_TABLET | Freq: Every day | ORAL | Status: DC
  Administered 2015-11-30 – 2015-12-09 (×10): 25 mg via ORAL
  Filled 2015-11-30 (×10): qty 1

## 2015-11-30 MED ORDER — INSULIN ASPART 100 UNIT/ML ~~LOC~~ SOLN: 0.0000 [IU] | Freq: Three times a day (TID) | SUBCUTANEOUS | Status: DC

## 2015-11-30 NOTE — Progress Notes (Signed)
ANTICOAGULATION CONSULT NOTE - Follow Up Consult  Pharmacy Consult for Heparin  Indication: pulmonary embolus  Allergies  Allergen Reactions  . Iodine Rash    Patient Measurements: Height: 5\' 7"  (170.2 cm) Weight: 277 lb 6.4 oz (125.828 kg) IBW/kg (Calculated) : 61.6 Vital Signs: Temp: 98.5 F (36.9 C) (07/09 0027) Temp Source: Oral (07/08 2135) BP: 96/84 mmHg (07/09 0027) Pulse Rate: 106 (07/09 0027)  Labs:  Recent Labs  11/29/15 1434 11/30/15 0205  HGB 14.2 13.4  HCT 44.0 43.6  PLT 136* 142*  HEPARINUNFRC  --  0.94*  CREATININE 1.46* 1.55*    Estimated Creatinine Clearance: 44.5 mL/min (by C-G formula based on Cr of 1.55).   Assessment: 75 y/o F on heparin drip for new onset PE, CBC good, noted bump in Scr, first HL is high at 0.94, no issues per RN.  Goal of Therapy:  Heparin level 0.3-0.7 units/ml Monitor platelets by anticoagulation protocol: Yes   Plan:  -Decrease heparin to 1350 units/hr -1100 HL  Aubreyanna Dorrough 11/30/2015,2:39 AM

## 2015-11-30 NOTE — Progress Notes (Signed)
ANTICOAGULATION CONSULT NOTE - Follow up [facility] for heparin Indication: pulmonary embolus  Allergies  Allergen Reactions  . Iodine Rash    Patient Measurements: Height: 5\' 7"  (170.2 cm) Weight: 278 lb 9.6 oz (126.372 kg) IBW/kg (Calculated) : 61.6 Heparin Dosing Weight: 89 kg  Vital Signs: BP: 123/86 mmHg (07/09 1830) Pulse Rate: 89 (07/09 1830)  Labs:  Recent Labs  11/29/15 1434 11/30/15 0205 11/30/15 1227 11/30/15 2136  HGB 14.2 13.4  --   --   HCT 44.0 43.6  --   --   PLT 136* 142*  --   --   HEPARINUNFRC  --  0.94* 0.74* 0.74*  CREATININE 1.46* 1.55*  --   --     Estimated Creatinine Clearance: 44.7 mL/min (by C-G formula based on Cr of 1.55).   Medical History: Past Medical History  Diagnosis Date  . Hyperlipidemia   . Hypertension   . Cold     just started 01/02/13 cough  . Complication of anesthesia     very phobic about needles.last surgery had to sedate before doing iv  . Exertional shortness of breath   . Uterine cancer ([place])   . Chronic lower back pain   . Depression   . Arthritis     "joints; mostly on the right side" (01/11/2013)  . Breast cancer ([place])     "left" (01/11/2013)  . Cancer ([place])     endometrial  . Allergy   . Osteoporosis     knees   . Status post radiation therapy within last four weeks 03/19/13-05/03/13    lt breast 60.4GY    Assessment: 58 yof on heparin gtt for bilateral PE. Plan to transition her to oral anticoagulant possibly on 7/10. Heparin drip 1250 uts/hr  HL still elevated at 0.74. Hgb stable, plts low at 142. No s/s of bleed.  Goal of Therapy:  Heparin level 0.3-0.7 units/ml Monitor platelets by anticoagulation protocol: Yes   Plan:  Decrease heparin gtt to 1100 units/hr Monitor daily HL, CBC, s/s of bleed F/u transition to PO agent   Bonnita Nasuti Pharm.D. CPP, BCPS Clinical Pharmacist [phone] 11/30/2015 10:29 PM

## 2015-11-30 NOTE — Progress Notes (Signed)
PROGRESS NOTE    ASAKO ALLEGRO  [id number] DOB: 04-Oct-1940 DOA: 11/29/2015 PCP: Salena Saner., MD   Brief Narrative:  Mrs Gaudet is a 75 year old female with a past medical history of breast cancer and endometrial cancer diagnosed in 2014 undergoing left lumpectomy followed by radiation therapy. She was admitted to the medicine service on 11/29/2015 when she presented with complaints of shortness of breath. Initial workup in the emergency department included a CT scan of lungs that revealed large burden of bilateral pulmonary emboli. She was started on IV heparin admitted to telemetry.   Assessment & Plan:   Principal Problem:   Pulmonary embolism, bilateral (HCC) Active Problems:   Breast cancer of upper-outer quadrant of left female breast ([place])   Acute kidney injury superimposed on chronic kidney disease ([place])   Depression   Essential hypertension   Acute respiratory failure with hypoxia (HCC)   Diabetes mellitus type 2 in obese ([place])   1.  Acute respiratory failure -Evidence by patient having a new oxygen requirement of 3 L, respiratory rate of 29, presenting in respiratory distress -This likely resulted from bilateral pulmonary emboli -Continue close monitoring on telemetry, she is being treated with IV heparin. Continue supplemental oxygen  2.  Bilateral pulmonary emboli -She denies any inciting factors -Has history of breast and endometrial cancer which has been treated -Discussed anticoagulation options, she states having a fear of needles and will not do well with Lovenox. For now will keep her on IV heparin and if she remains stable within the next 24 hours will transition her to oral agent. I think in this case NOAC will be better tolerated than warfarin  3.  Acute on chronic renal failure. -At work showing creatinine 1.55, previously 1.21 09/24/2015. -Continue gentle IV fluid hydration with normal saline running at 75 mL/hour -Likely body depleted from  diuretic therapy  4.  History of breast cancer -She is curling being seen at the cancer center by Dr. Jana Hakim -Status post lumpectomy followed by radiation therapy -On exemestane tx  5.  Hypertension -Continue metoprolol 50 mg by mouth twice a day    DVT prophylaxis: Full anticoagulated Code Status: Full code Family Communication:  Disposition Plan: Anticipate discharge in the next 24-48 hours if she remains hemodynamically stable  Consultants:     Procedures:     Antimicrobials:    Subjective: She complains of ongoing shortness of breath  Objective: Filed Vitals:   11/30/15 0500 11/30/15 0725 11/30/15 0800 11/30/15 1200  BP: 115/72 122/85 111/73 118/92  Pulse: 98 104 102 102  Temp: 98.4 F (36.9 C) 97.7 F (36.5 C)    TempSrc:  Oral    Resp: 25 23 29 17   Height:      Weight: 126.372 kg (278 lb 9.6 oz)     SpO2: 96% 92% 93% 93%    Intake/Output Summary (Last 24 hours) at 11/30/15 1302 Last data filed at 11/30/15 0900  Gross per 24 hour  Intake 1280.38 ml  Output    425 ml  Net 855.38 ml   Filed Weights   11/29/15 2135 11/30/15 0500  Weight: 125.828 kg (277 lb 6.4 oz) 126.372 kg (278 lb 9.6 oz)    Examination:  General exam: Appears Dyspneic at rest, on supplemental oxygen Respiratory system: Clear to auscultation. Respiratory effort normal. Cardiovascular system: S1 & S2 heard, RRR. No JVD, murmurs, rubs, gallops or clicks. No pedal edema. Gastrointestinal system: Abdomen is nondistended, soft and nontender. No organomegaly or masses felt. Normal bowel  sounds heard. Central nervous system: Alert and oriented. No focal neurological deficits. Extremities: Symmetric 5 x 5 power. Skin: No rashes, lesions or ulcers Psychiatry: Judgement and insight appear normal. Mood & affect appropriate.     Data Reviewed: I have personally reviewed following labs and imaging studies  CBC:  Recent Labs Lab 11/29/15 1434 11/30/15 0205  WBC 7.9 9.1    NEUTROABS 5.6  --   HGB 14.2 13.4  HCT 44.0 43.6  MCV 85.3 87.0  PLT 136* [id number]*   Basic Metabolic Panel:  Recent Labs Lab 11/29/15 1434 11/30/15 0205  NA 138 136  K 4.0 4.0  CL 107 108  CO2 21* 19*  GLUCOSE 156* 199*  BUN 19 18  CREATININE 1.46* 1.55*  CALCIUM 9.6 9.2   GFR: Estimated Creatinine Clearance: 44.7 mL/min (by C-G formula based on Cr of 1.55). Liver Function Tests: No results for input(s): AST, ALT, ALKPHOS, BILITOT, PROT, ALBUMIN in the last 168 hours. No results for input(s): LIPASE, AMYLASE in the last 168 hours. No results for input(s): AMMONIA in the last 168 hours. Coagulation Profile: No results for input(s): INR, PROTIME in the last 168 hours. Cardiac Enzymes: No results for input(s): CKTOTAL, CKMB, CKMBINDEX, TROPONINI in the last 168 hours. BNP (last 3 results) No results for input(s): PROBNP in the last 8760 hours. HbA1C: No results for input(s): HGBA1C in the last 72 hours. CBG: No results for input(s): GLUCAP in the last 168 hours. Lipid Profile: No results for input(s): CHOL, HDL, LDLCALC, TRIG, CHOLHDL, LDLDIRECT in the last 72 hours. Thyroid Function Tests: No results for input(s): TSH, T4TOTAL, FREET4, T3FREE, THYROIDAB in the last 72 hours. Anemia Panel: No results for input(s): VITAMINB12, FOLATE, FERRITIN, TIBC, IRON, RETICCTPCT in the last 72 hours. Sepsis Labs: No results for input(s): PROCALCITON, LATICACIDVEN in the last 168 hours.  Recent Results (from the past 240 hour(s))  MRSA PCR Screening     Status: None   Collection Time: 11/29/15  9:23 PM  Result Value Ref Range Status   MRSA by PCR NEGATIVE NEGATIVE Final    Comment:        The GeneXpert MRSA Assay (FDA approved for NASAL specimens only), is one component of a comprehensive MRSA colonization surveillance program. It is not intended to diagnose MRSA infection nor to guide or monitor treatment for MRSA infections.          Radiology Studies: Dg Chest 2  View  11/29/2015  CLINICAL DATA:  Cough beginning Tuesday, progressive shortness of breath since yesterday, history hypertension, hyperlipidemia, uterine cancer, breast cancer EXAM: CHEST  2 VIEW COMPARISON:  01/05/2013 FINDINGS: Upper normal heart size. Atherosclerotic calcification aorta. Mediastinal contours and pulmonary vascularity normal. Respiratory motion artifacts degrade diagnostic utility of lateral view. Lungs clear. No pulmonary infiltrate, pleural effusion or pneumothorax. Endplate spur formation thoracic spine. BILATERAL glenohumeral degenerative changes with degenerative disc disease changes thoracic spine. IMPRESSION: No acute abnormalities. Aortic atherosclerosis. Electronically Signed   By: Lavonia Dana M.D.   On: 11/29/2015 14:59   Ct Angio Chest Pe W/cm &/or Wo Cm  11/29/2015  CLINICAL DATA:  Cough and progressive shortness of breath. EXAM: CT ANGIOGRAPHY CHEST WITH CONTRAST TECHNIQUE: Multidetector CT imaging of the chest was performed using the standard protocol during bolus administration of intravenous contrast. Multiplanar CT image reconstructions and MIPs were obtained to evaluate the vascular anatomy. CONTRAST:  80 mL of Isovue 370 COMPARISON:  Chest x-ray from earlier today. CT scan of the chest February 08, 2013 FINDINGS:  The central airways are within normal limits. No pneumothorax identified. Scattered subsegmental atelectasis is identified. No pulmonary nodules, masses, or focal infiltrates are identified. Pulmonary emboli are identified. There is a large embolus in the left main pulmonary artery with emboli seen in the left upper and lower lobe branches. Pulmonary emboli are also seen on the right involving right upper, middle, and lower lobe branches. Embolus extends into the periphery of the right main pulmonary artery. The aorta and main pulmonary artery are similar in caliber measuring 3.7 cm. There is no definitive flattening of the intraventricular septum or deviation to the  left. The right and left ventricles are similar in size. There is no reflux of contrast into the inferior vena cava. No effusions or adenopathy. Evaluation of the upper abdomen is limited due to arterial phase imaging with no acute abnormalities seen. Visualized bones demonstrate degenerative changes in the spine but no other abnormalities. Review of the MIP images confirms the above findings. IMPRESSION: 1. There is a large burden of bilateral pulmonary emboli. Findings called to Dr. Daleen Bo Electronically Signed   By: Dorise Bullion III M.D   On: 11/29/2015 19:24        Scheduled Meds: . buPROPion  150 mg Oral BID  . carisoprodol  350 mg Oral Q6H  . cycloSPORINE  1 drop Both Eyes BID  . diclofenac sodium  1 application Topical QID  . exemestane  25 mg Oral Daily  . fenofibrate  160 mg Oral Daily  . gabapentin  300 mg Oral QHS  . metoprolol succinate  50 mg Oral BID  . pantoprazole  40 mg Oral Daily   Continuous Infusions: . sodium chloride 75 mL/hr (11/29/15 2251)  . heparin 1,350 Units/hr (11/30/15 1015)        Time spent: 35 min    Kelvin Cellar, MD [facility] Pager [phone]  If 7PM-7AM, please contact night-coverage [email or web address] Password [email or web address] 11/30/2015, 1:02 PM

## 2015-11-30 NOTE — Progress Notes (Signed)
ANTICOAGULATION CONSULT NOTE - Follow up [place] for heparin Indication: pulmonary embolus  Allergies  Allergen Reactions  . Iodine Rash    Patient Measurements: Height: 5\' 7"  (170.2 cm) Weight: 278 lb 9.6 oz (126.372 kg) IBW/kg (Calculated) : 61.6 Heparin Dosing Weight: 89 kg  Vital Signs: Temp: 97.7 F (36.5 C) (07/09 0725) Temp Source: Oral (07/09 0725) BP: 118/92 mmHg (07/09 1200) Pulse Rate: 102 (07/09 1200)  Labs:  Recent Labs  11/29/15 1434 11/30/15 0205 11/30/15 1227  HGB 14.2 13.4  --   HCT 44.0 43.6  --   PLT 136* 142*  --   HEPARINUNFRC  --  0.94* 0.74*  CREATININE 1.46* 1.55*  --     Estimated Creatinine Clearance: 44.7 mL/min (by C-G formula based on Cr of 1.55).   Medical History: Past Medical History  Diagnosis Date  . Hyperlipidemia   . Hypertension   . Cold     just started 01/02/13 cough  . Complication of anesthesia     very phobic about needles.last surgery had to sedate before doing iv  . Exertional shortness of breath   . Uterine cancer ([place])   . Chronic lower back pain   . Depression   . Arthritis     "joints; mostly on the right side" (01/11/2013)  . Breast cancer ([place])     "left" (01/11/2013)  . Cancer ([place])     endometrial  . Allergy   . Osteoporosis     knees   . Status post radiation therapy within last four weeks 03/19/13-05/03/13    lt breast 60.4GY    Assessment: 34 yof on heparin gtt for bilateral PE. Plan to transition her to oral anticoagulant possibly on 7/10. Last HL was still elevated at 0.74. Hgb stable, plts low at 142. No s/s of bleed.  Goal of Therapy:  Heparin level 0.3-0.7 units/ml Monitor platelets by anticoagulation protocol: Yes   Plan:  Decrease heparin gtt to 1,250 units/hr Monitor daily HL, CBC, s/s of bleed F/u transition to PO agent  Elenor Quinones, PharmD, BCPS Clinical Pharmacist Pager [phone] 11/30/2015 1:32 PM

## 2015-11-30 NOTE — Care Management Obs Status (Signed)
[facility] NOTIFICATION   Patient Details  Name: [name] MRN: [id number] Date of Birth: 07-Sep-1940   Medicare Observation Status Notification Given:  Yes    Apolonio Schneiders, RN 11/30/2015, 3:19 PM

## 2015-12-01 ENCOUNTER — Inpatient Hospital Stay (HOSPITAL_COMMUNITY): Payer: Medicare Other

## 2015-12-01 DIAGNOSIS — I2699 Other pulmonary embolism without acute cor pulmonale: Secondary | ICD-10-CM

## 2015-12-01 LAB — BASIC METABOLIC PANEL
ANION GAP: 8 (ref 5–15)
BUN: 13 mg/dL (ref 6–20)
CHLORIDE: 108 mmol/L (ref 101–111)
CO2: 23 mmol/L (ref 22–32)
Calcium: 9.3 mg/dL (ref 8.9–10.3)
Creatinine, Ser: 1.33 mg/dL — ABNORMAL HIGH (ref 0.44–1.00)
GFR calc Af Amer: 45 mL/min — ABNORMAL LOW (ref >60)
GFR, EST NON AFRICAN AMERICAN: 39 mL/min — AB (ref >60)
GLUCOSE: 132 mg/dL — AB (ref 65–99)
POTASSIUM: 4.4 mmol/L (ref 3.5–5.1)
SODIUM: 139 mmol/L (ref 135–145)

## 2015-12-01 LAB — CBC
HCT: 41.8 % (ref 36.0–46.0)
HEMATOCRIT: 43.3 % (ref 36.0–46.0)
HEMATOCRIT: 40.7 % (ref 36.0–46.0)
HEMOGLOBIN: 13 g/dL (ref 12.0–15.0)
HEMOGLOBIN: 13.5 g/dL (ref 12.0–15.0)
Hemoglobin: 12.4 g/dL (ref 12.0–15.0)
MCH: 27 pg (ref 26.0–34.0)
MCH: 27.3 pg (ref 26.0–34.0)
MCH: 26.8 pg (ref 26.0–34.0)
MCHC: 31.1 g/dL (ref 30.0–36.0)
MCHC: 31.2 g/dL (ref 30.0–36.0)
MCHC: 30.5 g/dL (ref 30.0–36.0)
MCV: 86.9 fL (ref 78.0–100.0)
MCV: 87.5 fL (ref 78.0–100.0)
MCV: 87.9 fL (ref 78.0–100.0)
PLATELETS: 137 10*3/uL — AB (ref 150–400)
Platelets: 163 10*3/uL (ref 150–400)
Platelets: 144 10*3/uL — ABNORMAL LOW (ref 150–400)
RBC: 4.81 MIL/uL (ref 3.87–5.11)
RBC: 4.95 MIL/uL (ref 3.87–5.11)
RBC: 4.63 MIL/uL (ref 3.87–5.11)
RDW: 15.1 % (ref 11.5–15.5)
RDW: 15.3 % (ref 11.5–15.5)
RDW: 15.4 % (ref 11.5–15.5)
WBC: 8.2 10*3/uL (ref 4.0–10.5)
WBC: 8.9 10*3/uL (ref 4.0–10.5)
WBC: 7.8 10*3/uL (ref 4.0–10.5)

## 2015-12-01 LAB — HEPARIN LEVEL (UNFRACTIONATED)
HEPARIN UNFRACTIONATED: 0.63 [IU]/mL (ref 0.30–0.70)
HEPARIN UNFRACTIONATED: 0.4 [IU]/mL (ref 0.30–0.70)

## 2015-12-01 LAB — ECHOCARDIOGRAM COMPLETE
CHL CUP RV SYS PRESS: 46 mmHg
CHL CUP TV REG PEAK VELOCITY: 327 cm/s
FS: 26 % — AB (ref 28–44)
Height: 67 in
IVS/LV PW RATIO, ED: 1.04
LA ID, A-P, ES: 40 mm
LA diam index: 1.72 cm/m2
LA vol A4C: 45.4 ml
LDCA: 3.46 cm2
LEFT ATRIUM END SYS DIAM: 40 mm
LV PW d: 10.8 mm — AB (ref 0.6–1.1)
LVOT diameter: 21 mm
RV LATERAL S' VELOCITY: 8.3 cm/s
RV TAPSE: 13 mm
TRMAXVEL: 327 cm/s
Weight: 4457.6 oz

## 2015-12-01 LAB — FIBRINOGEN: Fibrinogen: 427 mg/dL (ref 204–475)

## 2015-12-01 LAB — TROPONIN I: TROPONIN I: 0.03 ng/mL — AB (ref <0.03)

## 2015-12-01 MED ORDER — SODIUM CHLORIDE 0.9 % IV SOLN
12.0000 mg | Freq: Once | INTRAVENOUS | Status: AC
  Administered 2015-12-01: 12 mg via INTRAVENOUS
  Filled 2015-12-01: qty 12

## 2015-12-01 MED ORDER — SODIUM CHLORIDE 0.9 % IV SOLN: INTRAVENOUS | Status: DC

## 2015-12-01 MED ORDER — SODIUM CHLORIDE 0.9% FLUSH
3.0000 mL | Freq: Two times a day (BID) | INTRAVENOUS | Status: DC
  Administered 2015-12-01 – 2015-12-03 (×3): 3 mL via INTRAVENOUS

## 2015-12-01 MED ORDER — SODIUM CHLORIDE 0.9 % IV SOLN
INTRAVENOUS | Status: DC
  Administered 2015-12-02: 08:00:00 via INTRAVENOUS

## 2015-12-01 MED ORDER — SODIUM CHLORIDE 0.9 % IV SOLN: 250.0000 mL | INTRAVENOUS | Status: DC | PRN

## 2015-12-01 MED ORDER — SODIUM CHLORIDE 0.9 % IV BOLUS (SEPSIS): 1000.0000 mL | Freq: Once | INTRAVENOUS | Status: DC

## 2015-12-01 MED ORDER — MIDAZOLAM HCL 2 MG/2ML IJ SOLN
INTRAMUSCULAR | Status: AC
  Filled 2015-12-01: qty 4

## 2015-12-01 MED ORDER — ALTEPLASE 50 MG IV SOLR
12.0000 mg | Freq: Once | INTRAVENOUS | Status: AC
  Administered 2015-12-01: 12 mg via INTRAVENOUS
  Filled 2015-12-01: qty 12

## 2015-12-01 MED ORDER — FENTANYL CITRATE (PF) 100 MCG/2ML IJ SOLN
INTRAMUSCULAR | Status: AC | PRN
  Administered 2015-12-01 (×4): 25 ug via INTRAVENOUS

## 2015-12-01 MED ORDER — LIDOCAINE HCL (PF) 1 % IJ SOLN
INTRAMUSCULAR | Status: AC | PRN
  Administered 2015-12-01: 10 mg via INTRADERMAL

## 2015-12-01 MED ORDER — PERFLUTREN LIPID MICROSPHERE
INTRAVENOUS | Status: AC
  Administered 2015-12-01: 4 mL
  Filled 2015-12-01: qty 10

## 2015-12-01 MED ORDER — PERFLUTREN LIPID MICROSPHERE
1.0000 mL | INTRAVENOUS | Status: AC | PRN
  Filled 2015-12-01: qty 10

## 2015-12-01 MED ORDER — SODIUM CHLORIDE 0.9% FLUSH: 3.0000 mL | INTRAVENOUS | Status: DC | PRN

## 2015-12-01 MED ORDER — SODIUM CHLORIDE 0.9 % IV SOLN
INTRAVENOUS | Status: DC
  Administered 2015-12-02 – 2015-12-03 (×3): via INTRAVENOUS

## 2015-12-01 MED ORDER — IOPAMIDOL (ISOVUE-300) INJECTION 61%
INTRAVENOUS | Status: AC
  Filled 2015-12-01: qty 50

## 2015-12-01 MED ORDER — FENTANYL CITRATE (PF) 100 MCG/2ML IJ SOLN
INTRAMUSCULAR | Status: AC
  Filled 2015-12-01: qty 4

## 2015-12-01 MED ORDER — DULOXETINE HCL 20 MG PO CPEP
20.0000 mg | ORAL_CAPSULE | Freq: Every day | ORAL | Status: DC
  Administered 2015-12-01 – 2015-12-09 (×9): 20 mg via ORAL
  Filled 2015-12-01 (×9): qty 1

## 2015-12-01 MED ORDER — LIDOCAINE HCL 1 % IJ SOLN
INTRAMUSCULAR | Status: AC
  Filled 2015-12-01: qty 20

## 2015-12-01 MED ORDER — MIDAZOLAM HCL 2 MG/2ML IJ SOLN
INTRAMUSCULAR | Status: AC | PRN
  Administered 2015-12-01 (×4): 0.5 mg via INTRAVENOUS

## 2015-12-01 MED ORDER — INSULIN ASPART 100 UNIT/ML ~~LOC~~ SOLN
2.0000 [IU] | SUBCUTANEOUS | Status: DC
  Administered 2015-12-02: 4 [IU] via SUBCUTANEOUS

## 2015-12-01 NOTE — Sedation Documentation (Signed)
Patient is resting comfortably. 

## 2015-12-01 NOTE — Consult Note (Signed)
PULMONARY / CRITICAL CARE MEDICINE   Name: [name] MRN: [id number] DOB: [birth year]    ADMISSION DATE:  11/29/2015   CONSULTATION DATE:  12/01/15  REFERRING MD: Dr. Coralyn Pear  CHIEF COMPLAINT: New pulmonary Embolus  HISTORY OF PRESENT ILLNESS:   [name] is a 75 y.o. female with medical history significant of HTN, HLD, breast cancer s/p lumpectomy/radiation, hysterectomy/BSO for endometrial cancer, and arthritis; who presents with complaints of shortness of breath. Symptoms started approximately 11/25/15 with dry cough and progressed to shortness of breath. On admission to the hospital a CTA was performed and revealed bilateral pulmonary emboli involving the left upper and lower lobe branches and the right upper, middle and lower lobe branches. ECHO reveals mildly dilated right ventricle with moderately reduced systolic function. PCCM is consulted for shortness and breath and consideration for EKOS.   PAST MEDICAL HISTORY :  She  has a past medical history of Hyperlipidemia; Hypertension; Cold; Complication of anesthesia; Exertional shortness of breath; Uterine cancer ([place]); Chronic lower back pain; Depression; Arthritis; Breast cancer ([place]); Cancer ([place]); Allergy; Osteoporosis; and Status post radiation therapy within last four weeks (03/19/13-05/03/13).  PAST SURGICAL HISTORY: She  has past surgical history that includes Replacement total knee (Bilateral, 2011-2012); Foot Tendon Surgery (Right); Breast lumpectomy with needle localization and axillary sentinel lymph node bx (Left, 01/11/2013); Breast biopsy (Left, 12/2012); Abdominal hysterectomy (12/14/2012); Dilation and curettage of uterus; Tubal ligation; Cholecystectomy (1970); Knee arthroscopy (Right, 1990's); and Breast lumpectomy with needle localization and axillary sentinel lymph node bx (Left, 01/11/2013).  Allergies  Allergen Reactions  . Iodine Rash    No current facility-administered medications on file prior to  encounter.   Current Outpatient Prescriptions on File Prior to Encounter  Medication Sig  . carisoprodol (SOMA) 350 MG tablet Take 350 mg by mouth every 6 (six) hours.   . Diclofenac Sodium (PENNSAID) 1.5 % SOLN Place 10 drops onto the skin 4 (four) times daily. For pain  . exemestane (AROMASIN) 25 MG tablet Take 1 tablet (25 mg total) by mouth daily.  Marland Kitchen gabapentin (NEURONTIN) 300 MG capsule Take 1 capsule (300 mg total) by mouth at bedtime.  Marland Kitchen HYDROcodone-acetaminophen (NORCO/VICODIN) 5-325 MG per tablet Take 1 tablet by mouth every 6 (six) hours as needed for moderate pain.  . metoprolol succinate (TOPROL-XL) 50 MG 24 hr tablet Take 50 mg by mouth 2 (two) times daily.   . Multiple Vitamin (MULTIVITAMIN WITH MINERALS) TABS tablet Take 1 tablet by mouth daily.  . RESTASIS 0.05 % ophthalmic emulsion Place 1 drop into both eyes 2 (two) times daily.   . DULoxetine (CYMBALTA) 20 MG capsule Take 1 capsule (20 mg total) by mouth daily.    FAMILY HISTORY:  Her has no family status information on file.   SOCIAL HISTORY: She  reports that she has never smoked. She has never used smokeless tobacco. She reports that she does not drink alcohol or use illicit drugs.  REVIEW OF SYSTEMS:   Negative except HPI  SUBJECTIVE:    VITAL SIGNS: BP 117/74 mmHg  Pulse 102  Temp(Src) 99 F (37.2 C) (Oral)  Resp 22  Ht 5\' 7"  (1.702 m)  Wt 126.372 kg (278 lb 9.6 oz)  BMI 43.62 kg/m2  SpO2 93%  HEMODYNAMICS:    VENTILATOR SETTINGS:    INTAKE / OUTPUT: I/O last 3 completed shifts: In: 1835.4 [P.O.:1155; I.V.:680.4] Out: 1075 [Urine:1075]  PHYSICAL EXAMINATION: General: Morbidly obese elderly female lying awake in bed Neuro: Alert and oriented x 4,  no focal deficits HEENT: WNL, no stridor, moist mucous membranes Cardiovascular: S1S2, RRR no murmur, trace pretibial edema Lungs: Distant lung sounds secondary body habitus Abdomen:  Obese, BS x 4 quadrants Musculoskeletal: MAE Skin: no issues  noted  LABS:  BMET  Recent Labs Lab 11/29/15 1434 11/30/15 0205  NA 138 136  K 4.0 4.0  CL 107 108  CO2 21* 19*  BUN 19 18  CREATININE 1.46* 1.55*  GLUCOSE 156* 199*    Electrolytes  Recent Labs Lab 11/29/15 1434 11/30/15 0205  CALCIUM 9.6 9.2    CBC  Recent Labs Lab 11/30/15 0205 12/01/15 0425 12/01/15 1534  WBC 9.1 8.2 8.9  HGB 13.4 13.0 13.5  HCT 43.6 41.8 43.3  PLT 142* 137* 163    Coag's No results for input(s): APTT, INR in the last 168 hours.  Sepsis Markers No results for input(s): LATICACIDVEN, PROCALCITON, O2SATVEN in the last 168 hours.  ABG No results for input(s): PHART, PCO2ART, PO2ART in the last 168 hours.  Liver Enzymes No results for input(s): AST, ALT, ALKPHOS, BILITOT, ALBUMIN in the last 168 hours.  Cardiac Enzymes No results for input(s): TROPONINI, PROBNP in the last 168 hours.  Glucose No results for input(s): GLUCAP in the last 168 hours.  Imaging No results found.   STUDIES:  11/29/15: CXR: No acute abnormalities 11/29/15 CTA: There is a large burden of bilateral pulmonary emboli.  CULTURES:   ANTIBIOTICS: None  SIGNIFICANT EVENTS:   LINES/TUBES:   DISCUSSION: 7/10: PCCM consulted for evaluation of shortness of breath and bilateral pulmonary emboli and possible EKOS.   ASSESSMENT / PLAN:  PULMONARY A: PE Shortness of Breath P:   O2 to maintain sats > 92% IR for EKOS this evening ICU transfer pending post-procedure Heparin infusion per pharmacy Recommend [place] of BLE  CARDIOVASCULAR A:  HTN HLD P:  Telemetry monitoring in ICU Continue Metoprolol  Continue Fenofibrate  RENAL A:   CKD stage 1 P:   Monitor UOP and renal function Will receive small amount of contrast during procedure Electrolyte replacement per ICU protocol  GASTROINTESTINAL A:   NPO P:   NPO for procedure Will evaluate after procedure Continue Protonix Zofran as needed for nausea  HEMATOLOGIC A:   PE- possible  provoked in the setting of exemestane use and sedentary lifestyle P:  Recommend [place] of BLE Heparin gtt per pharmacy Pursue hypercoag workup outpatient Consider malignancy due Hx of breast cancer and endometrial cancer Discussed concerns for malignancy with Dr Kathlene Cote, feels that there is low risk and will proceed with procedure without further imaging.   INFECTIOUS A:   No acute issues P:   Monitor CBC and fever curve No indication for abx at this time  ENDOCRINE A:    Diabetes- on metformin at home P:   F/u A1C Accuchecks AC/HS Cover with SSI Hold Metformin in the setting of CKD and receiving contrast in IR  NEUROLOGIC A:   No acute issues P:   Monitor neuro status Low threshold to image after TPA  FAMILY  - Updates: Family and patient updated by Dr Lamonte Sakai at bedside. All questions answered.  - Inter-disciplinary family meet or Palliative Care meeting due by: 12/08/2015   Georgann Housekeeper, AGACNP-BC [facility] Pager [phone] or [phone]  12/01/2015 5:39 PM     Attending Note:  I have examined patient, reviewed labs, studies and notes. I have discussed the case with Jaclynn Guarneri, and I agree with the data and plans as amended above.  75 yo woman with hx breast CA and uterine CA, last rx in 2014, admitted with dyspnea and tachycardia, found to have large B PE on CT-PA. She was started on heparin and stabilized, has remained dyspneic. TTE performed that confirmed mild RV enlargement and mild to moderate decreased RV fxn. For this reason she is referred for consideration of local lytic therapy. On my evaluation she is an obese woman, awake and appropriate but with a mild increase in WOB on [place] O2. She is tachycardic to 110's. she meets criteria for potential benefit from EKOS. I discussed the case with the patient and also with Dr Kathlene Cote. The most notable potential contraindication would be malignancy, especially intracranial disease, but her breast  and uterine CA were low stage, were definitively treated in 2014 and have shown no evidence of recurrence. We agree that the risk for brain mets at this time is extremely low. I will defer to Dr Kathlene Cote at to whether there is any role to image her brain prior to EKOS - I suspect that there is no role in her case. We will take Ms Hingle on our service on transfer to ICU post-procedure for monitoring   Baltazar Apo, MD, PhD 12/02/2015, 7:40 AM [facility] [phone] or if no answer [phone]

## 2015-12-01 NOTE — Procedures (Signed)
Interventional Radiology Procedure Note  Procedure:  Bilateral pulmonary angiography and EKOS [place] assisted pulmonary thrombolytic therapy  Complications:  None  Estimated Blood Loss: < 10 mL  Significant pulmonary arterial thrombus burden bilaterally. Markedly elevated pulmonary artery pressure measurements:  MPA:  63/31 (mean 44) mm Hg LPA:   67/53 (mean 61) mm Hg RPA:  68/32 (mean 47) mm Hg  EKOS 18 infusion catheter system placed in right pulmonary artery and EKOS 12 infusion catheter system placed in left pulmonary artery.  Will begin tPA infusion at 1 mg/hr bilaterally for 12 hours then recheck PA pressures in AM after completion of initial 12 hour therapy.  Venetia Night. Kathlene Cote, M.D Pager:  [phone]

## 2015-12-01 NOTE — Progress Notes (Signed)
Follow up note  CT PE showed significant bilateral clot burden.  Case discussed with Dr Marlou Porch of cardiology, echo showing evidence of right sided heart strain. Clinically she is dyspneic at rest, tachycardic, requiring 3L [place], blood pressures stable. .  I spoke to Dr Kathlene Cote of IR regarding directed thrombolysis. Will evaluate patient. PCCM aware as she will need to be transferred to ICU for this procedure.

## 2015-12-01 NOTE — Care Management Important Message (Signed)
Important Message  Patient Details  Name: [name] MRN: [id number] Date of Birth: November 13, 1940   Medicare Important Message Given:  Yes    Loann Quill 12/01/2015, 8:59 AM

## 2015-12-01 NOTE — Progress Notes (Signed)
ANTICOAGULATION CONSULT NOTE - Follow up [place] for heparin Indication: pulmonary embolus (on EKOS)  Allergies  Allergen Reactions  . Iodine Rash    Patient Measurements: Height: 5\' 7"  (170.2 cm) Weight: 278 lb 9.6 oz (126.372 kg) IBW/kg (Calculated) : 61.6 Heparin Dosing Weight: 89 kg  Vital Signs: Temp: 99 F (37.2 C) (07/10 1550) Temp Source: Oral (07/10 1550) BP: 138/98 mmHg (07/10 1952) Pulse Rate: 102 (07/10 1952)  Labs:  Recent Labs  11/29/15 1434 11/30/15 0205  11/30/15 2136 12/01/15 0425 12/01/15 0429 12/01/15 1534 12/01/15 2125  HGB 14.2 13.4  --   --  13.0  --  13.5 12.4  HCT 44.0 43.6  --   --  41.8  --  43.3 40.7  PLT 136* 142*  --   --  137*  --  163 144*  HEPARINUNFRC  --  0.94*  < > 0.74*  --  0.63  --  0.40  CREATININE 1.46* 1.55*  --   --   --   --  1.33*  --   TROPONINI  --   --   --   --   --   --  0.03*  --   < > = values in this interval not displayed.  Estimated Creatinine Clearance: 52 mL/min (by C-G formula based on Cr of 1.33).   Medical History: Past Medical History  Diagnosis Date  . Hyperlipidemia   . Hypertension   . Cold     just started 01/02/13 cough  . Complication of anesthesia     very phobic about needles.last surgery had to sedate before doing iv  . Exertional shortness of breath   . Uterine cancer ([place])   . Chronic lower back pain   . Depression   . Arthritis     "joints; mostly on the right side" (01/11/2013)  . Breast cancer ([place])     "left" (01/11/2013)  . Cancer ([place])     endometrial  . Allergy   . Osteoporosis     knees   . Status post radiation therapy within last four weeks 03/19/13-05/03/13    lt breast 60.4GY    Assessment: 7 yof on heparin gtt for bilateral PE. Plan to transition her to oral anticoagulant possibly on 7/10. Heparin drip 1100 units/hr.  Heparin level is therapeutic this am at 0.63. Hgb stable, plts low stable at 137. No bleed noted.  Now s/p IR > on EKOS with TPA  infusion.  Heparin level remains at goal.  No bleeding or complications noted.  Goal of Therapy:  Heparin level 0.3-0.7 units/ml Monitor platelets by anticoagulation protocol: Yes   Plan:  Continue heparin gtt at 1100 units/hr Monitor daily HL, CBC, s/s of bleed F/u transition to PO agent  Thank you for allowing [place] to participate in this patients care.  Uvaldo Rising, BCPS  Clinical Pharmacist Pager [phone]  12/01/2015 10:07 PM

## 2015-12-01 NOTE — Progress Notes (Signed)
PROGRESS NOTE    [name]  [id number] DOB: [birth year] DOA: 11/29/2015 PCP: Salena Saner., MD   Brief Narrative:  [name] is a 75 year old female with a past medical history of breast cancer and endometrial cancer diagnosed in 2014 undergoing left lumpectomy followed by radiation therapy. She was admitted to the medicine service on 11/29/2015 when she presented with complaints of shortness of breath. Initial workup in the emergency department included a CT scan of lungs that revealed large burden of bilateral pulmonary emboli. She was started on IV heparin admitted to telemetry.   Assessment & Plan:   Principal Problem:   Pulmonary embolism, bilateral (HCC) Active Problems:   Breast cancer of upper-outer quadrant of left female breast ([place])   Acute kidney injury superimposed on chronic kidney disease ([place])   Depression   Essential hypertension   Acute respiratory failure with hypoxia (HCC)   Diabetes mellitus type 2 in obese ([place])   1.  Acute respiratory failure -Evidence by patient having a new oxygen requirement of 3 L, respiratory rate of 29, presenting in respiratory distress -This likely resulted from bilateral pulmonary emboli -Continue close monitoring on telemetry, she is being treated with IV heparin. Continue supplemental oxygen  2.  Bilateral pulmonary emboli -She denies any inciting factors -Has history of breast and endometrial cancer which has been treated -CT scan of lungs with IV contrast performed on admission showed large embolus the left main pulmonary artery with emboli seen in the left upper and lower lobe branches along with pulmonary emboli also seen in the right upper middle and lower lobe branches. -I discussed case with Dr. Lamonte Sakai a pulmonary critical care medicine who recommended targeted lysis if there is evidence of right-sided heart strain on transthoracic echocardiogram. 2-D echo results are pending at the time of this dictation.  3.   Acute on chronic renal failure. -At work showing creatinine 1.55, previously 1.21 09/24/2015. -I secondary to volume depletion, he was given IV fluid hydration with normal saline -Repeat labs pending  4.  History of breast cancer -She is curling being seen at the cancer center by Dr. Jana Hakim -Status post lumpectomy followed by radiation therapy -On exemestane tx  5.  Hypertension -Will decrease her metoprolol dose from 50-25 mg as I'm concerned with precipitating hypotension, particularly having significant clot burden on CT. Her last blood pressure was 117/74.    DVT prophylaxis: Full anticoagulated Code Status: Full code Family Communication:  Disposition Plan:   Consultants:   Telephone consultation made to pulmonary critical care medicine  Procedures:     Antimicrobials:    Subjective: She complains of ongoing shortness of breath  Objective: Filed Vitals:   11/30/15 1830 11/30/15 2000 11/30/15 2253 12/01/15 0500  BP: 123/86 117/91 116/80 117/74  Pulse: 89 103 101 102  Temp:  97.7 F (36.5 C)  97.8 F (36.6 C)  TempSrc:      Resp: 18 30  22   Height:      Weight:      SpO2: 94% 92%  93%    Intake/Output Summary (Last 24 hours) at 12/01/15 1454 Last data filed at 12/01/15 1359  Gross per 24 hour  Intake    315 ml  Output    850 ml  Net   -535 ml   Filed Weights   11/29/15 2135 11/30/15 0500  Weight: 125.828 kg (277 lb 6.4 oz) 126.372 kg (278 lb 9.6 oz)    Examination:  General exam: Appears Dyspneic at rest, on supplemental  oxygen Respiratory system: Clear to auscultation. Respiratory effort normal. Cardiovascular system: S1 & S2 heard, RRR. No JVD, murmurs, rubs, gallops or clicks. No pedal edema. Gastrointestinal system: Abdomen is nondistended, soft and nontender. No organomegaly or masses felt. Normal bowel sounds heard. Central nervous system: Alert and oriented. No focal neurological deficits. Extremities: Symmetric 5 x 5 power. Skin: No  rashes, lesions or ulcers Psychiatry: Judgement and insight appear normal. Mood & affect appropriate.     Data Reviewed: I have personally reviewed following labs and imaging studies  CBC:  Recent Labs Lab 11/29/15 1434 11/30/15 0205 12/01/15 0425  WBC 7.9 9.1 8.2  NEUTROABS 5.6  --   --   HGB 14.2 13.4 13.0  HCT 44.0 43.6 41.8  MCV 85.3 87.0 86.9  PLT 136* 142* 0000000*   Basic Metabolic Panel:  Recent Labs Lab 11/29/15 1434 11/30/15 0205  NA 138 136  K 4.0 4.0  CL 107 108  CO2 21* 19*  GLUCOSE 156* 199*  BUN 19 18  CREATININE 1.46* 1.55*  CALCIUM 9.6 9.2   GFR: Estimated Creatinine Clearance: 44.7 mL/min (by C-G formula based on Cr of 1.55). Liver Function Tests: No results for input(s): AST, ALT, ALKPHOS, BILITOT, PROT, ALBUMIN in the last 168 hours. No results for input(s): LIPASE, AMYLASE in the last 168 hours. No results for input(s): AMMONIA in the last 168 hours. Coagulation Profile: No results for input(s): INR, PROTIME in the last 168 hours. Cardiac Enzymes: No results for input(s): CKTOTAL, CKMB, CKMBINDEX, TROPONINI in the last 168 hours. BNP (last 3 results) No results for input(s): PROBNP in the last 8760 hours. HbA1C: No results for input(s): HGBA1C in the last 72 hours. CBG: No results for input(s): GLUCAP in the last 168 hours. Lipid Profile: No results for input(s): CHOL, HDL, LDLCALC, TRIG, CHOLHDL, LDLDIRECT in the last 72 hours. Thyroid Function Tests: No results for input(s): TSH, T4TOTAL, FREET4, T3FREE, THYROIDAB in the last 72 hours. Anemia Panel: No results for input(s): VITAMINB12, FOLATE, FERRITIN, TIBC, IRON, RETICCTPCT in the last 72 hours. Sepsis Labs: No results for input(s): PROCALCITON, LATICACIDVEN in the last 168 hours.  Recent Results (from the past 240 hour(s))  MRSA PCR Screening     Status: None   Collection Time: 11/29/15  9:23 PM  Result Value Ref Range Status   MRSA by PCR NEGATIVE NEGATIVE Final    Comment:         The GeneXpert MRSA Assay (FDA approved for NASAL specimens only), is one component of a comprehensive MRSA colonization surveillance program. It is not intended to diagnose MRSA infection nor to guide or monitor treatment for MRSA infections.          Radiology Studies: Dg Chest 2 View  11/29/2015  CLINICAL DATA:  Cough beginning Tuesday, progressive shortness of breath since yesterday, history hypertension, hyperlipidemia, uterine cancer, breast cancer EXAM: CHEST  2 VIEW COMPARISON:  01/05/2013 FINDINGS: Upper normal heart size. Atherosclerotic calcification aorta. Mediastinal contours and pulmonary vascularity normal. Respiratory motion artifacts degrade diagnostic utility of lateral view. Lungs clear. No pulmonary infiltrate, pleural effusion or pneumothorax. Endplate spur formation thoracic spine. BILATERAL glenohumeral degenerative changes with degenerative disc disease changes thoracic spine. IMPRESSION: No acute abnormalities. Aortic atherosclerosis. Electronically Signed   By: Lavonia Dana M.D.   On: 11/29/2015 14:59   Ct Angio Chest Pe W/cm &/or Wo Cm  11/29/2015  CLINICAL DATA:  Cough and progressive shortness of breath. EXAM: CT ANGIOGRAPHY CHEST WITH CONTRAST TECHNIQUE: Multidetector CT imaging of  the chest was performed using the standard protocol during bolus administration of intravenous contrast. Multiplanar CT image reconstructions and MIPs were obtained to evaluate the vascular anatomy. CONTRAST:  80 mL of Isovue 370 COMPARISON:  Chest x-ray from earlier today. CT scan of the chest February 08, 2013 FINDINGS: The central airways are within normal limits. No pneumothorax identified. Scattered subsegmental atelectasis is identified. No pulmonary nodules, masses, or focal infiltrates are identified. Pulmonary emboli are identified. There is a large embolus in the left main pulmonary artery with emboli seen in the left upper and lower lobe branches. Pulmonary emboli are also seen  on the right involving right upper, middle, and lower lobe branches. Embolus extends into the periphery of the right main pulmonary artery. The aorta and main pulmonary artery are similar in caliber measuring 3.7 cm. There is no definitive flattening of the intraventricular septum or deviation to the left. The right and left ventricles are similar in size. There is no reflux of contrast into the inferior vena cava. No effusions or adenopathy. Evaluation of the upper abdomen is limited due to arterial phase imaging with no acute abnormalities seen. Visualized bones demonstrate degenerative changes in the spine but no other abnormalities. Review of the MIP images confirms the above findings. IMPRESSION: 1. There is a large burden of bilateral pulmonary emboli. Findings called to Dr. Daleen Bo Electronically Signed   By: Dorise Bullion III M.D   On: 11/29/2015 19:24        Scheduled Meds: . buPROPion  150 mg Oral BID  . carisoprodol  350 mg Oral Q6H  . cycloSPORINE  1 drop Both Eyes BID  . diclofenac sodium  1 application Topical QID  . DULoxetine  20 mg Oral Daily  . exemestane  25 mg Oral Daily  . fenofibrate  160 mg Oral Daily  . gabapentin  300 mg Oral QHS  . metoprolol succinate  50 mg Oral BID  . pantoprazole  40 mg Oral Daily   Continuous Infusions: . heparin 1,100 Units/hr (12/01/15 0704)     LOS: 1 day    Time spent: 18 min    Kelvin Cellar, MD [facility] Pager [phone]  If 7PM-7AM, please contact night-coverage [email or web address] Password [email or web address] 12/01/2015, 2:54 PM

## 2015-12-01 NOTE — Progress Notes (Signed)
  Echocardiogram 2D Echocardiogram has been performed.  Johny Chess 12/01/2015, 4:00 PM

## 2015-12-01 NOTE — Sedation Documentation (Signed)
R PA pressure: 68/32, M (47)

## 2015-12-01 NOTE — Sedation Documentation (Signed)
Pt Asks for meds to go back to sleep- MD approves

## 2015-12-01 NOTE — Sedation Documentation (Signed)
L PA pressure: 67/53, M (61)

## 2015-12-01 NOTE — Progress Notes (Signed)
ANTICOAGULATION CONSULT NOTE - Follow up [place] for heparin Indication: pulmonary embolus  Allergies  Allergen Reactions  . Iodine Rash    Patient Measurements: Height: 5\' 7"  (170.2 cm) Weight: 278 lb 9.6 oz (126.372 kg) IBW/kg (Calculated) : 61.6 Heparin Dosing Weight: 89 kg  Vital Signs: Temp: 97.8 F (36.6 C) (07/10 0500) BP: 117/Sherry mmHg (07/10 0500) Pulse Rate: 102 (07/10 0500)  Labs:  Recent Labs  11/29/15 1434  11/30/15 0205 11/30/15 1227 11/30/15 2136 12/01/15 0425 12/01/15 0429  HGB 14.2  --  13.4  --   --  13.0  --   HCT 44.0  --  43.6  --   --  41.8  --   PLT 136*  --  142*  --   --  137*  --   HEPARINUNFRC  --   < > 0.94* 0.Sherry* 0.Sherry*  --  0.63  CREATININE 1.46*  --  1.55*  --   --   --   --   < > = values in this interval not displayed.  Estimated Creatinine Clearance: 44.7 mL/min (by C-G formula based on Cr of 1.55).   Medical History: Past Medical History  Diagnosis Date  . Hyperlipidemia   . Hypertension   . Cold     just started 01/02/13 cough  . Complication of anesthesia     very phobic about needles.last surgery had to sedate before doing iv  . Exertional shortness of breath   . Uterine cancer ([place])   . Chronic lower back pain   . Depression   . Arthritis     "joints; mostly on the right side" (01/11/2013)  . Breast cancer ([place])     "left" (01/11/2013)  . Cancer ([place])     endometrial  . Allergy   . Osteoporosis     knees   . Status post radiation therapy within last four weeks 03/19/13-05/03/13    lt breast 60.4GY    Assessment: [name] on heparin gtt for bilateral PE. Plan to transition her to oral anticoagulant possibly on 7/10. Heparin drip 1100 units/hr.  Heparin level is therapeutic this am at 0.63. Hgb stable, plts low stable at 137. No bleed noted.  Goal of Therapy:  Heparin level 0.3-0.7 units/ml Monitor platelets by anticoagulation protocol: Yes   Plan:  Continue heparin gtt at 1100 units/hr Monitor  daily HL, CBC, s/s of bleed F/u transition to PO agent   Thank you for allowing [place] to participate in this patients care. Jens Som, PharmD Pager: [phone]  12/01/2015 11:30 AM

## 2015-12-01 NOTE — H&P (Signed)
Chief Complaint: Patient was seen in consultation today for PE lysis at the request of Dr. Coralyn Pear.  Referring Physician(s): Dr. Kelvin Cellar  Patient Status: Inpatient  History of Present Illness: [name] is a 75 y.o. female admitted on 11/29/15 with submassive bilateral PE and respiratory distress.  CTA showed substantial bilateral clot burden in the pulmonary arteries.  No significant evidence by CTA of right heart strain at the time of admission.  Symptoms started late Thur/early Fri last week with dyspnea with exertion.  No significant lower extremity edema or pain.  No troponin leak at time of admission.  Continued labored breathing at rest with O2 sat 92-93% on 3L N/C.  Echo today shows evidence of right heart strain and elevated PAP of approximately 46 mm Hg.    Past Medical History  Diagnosis Date  . Hyperlipidemia   . Hypertension   . Cold     just started 01/02/13 cough  . Complication of anesthesia     very phobic about needles.last surgery had to sedate before doing iv  . Exertional shortness of breath   . Uterine cancer ([place])   . Chronic lower back pain   . Depression   . Arthritis     "joints; mostly on the right side" (01/11/2013)  . Breast cancer ([place])     "left" (01/11/2013)  . Cancer ([place])     endometrial  . Allergy   . Osteoporosis     knees   . Status post radiation therapy within last four weeks 03/19/13-05/03/13    lt breast 60.4GY    Past Surgical History  Procedure Laterality Date  . Replacement total knee Bilateral 2011-2012  . Foot tendon surgery Right   . Breast lumpectomy with needle localization and axillary sentinel lymph node bx Left 01/11/2013  . Breast biopsy Left 12/2012  . Abdominal hysterectomy  12/14/2012  . Dilation and curettage of uterus      "@ least one" (01/11/2013)  . Tubal ligation    . Cholecystectomy  1970  . Knee arthroscopy Right 1990's    "twice" (01/11/2013)  . Breast lumpectomy with needle localization and  axillary sentinel lymph node bx Left 01/11/2013    Procedure: BREAST LUMPECTOMY WITH NEEDLE LOCALIZATION AND AXILLARY SENTINEL LYMPH NODE BX;  Surgeon: Rolm Bookbinder, MD;  Location: [facility];  Service: General;  Laterality: Left;    Allergies: Iodine  Medications: Prior to Admission medications   Medication Sig Start Date End Date Taking? Authorizing Provider  buPROPion (WELLBUTRIN SR) 150 MG 12 hr tablet Take 150 mg by mouth 2 (two) times daily. 11/11/15  Yes Historical Provider, MD  carisoprodol (SOMA) 350 MG tablet Take 350 mg by mouth every 6 (six) hours.  12/15/12  Yes Historical Provider, MD  Diclofenac Sodium (PENNSAID) 1.5 % SOLN Place 10 drops onto the skin 4 (four) times daily. For pain   Yes Historical Provider, MD  exemestane (AROMASIN) 25 MG tablet Take 1 tablet (25 mg total) by mouth daily. 09/24/15  Yes Chauncey Cruel, MD  fenofibrate 160 MG tablet Take 160 mg by mouth daily. 10/02/15  Yes Historical Provider, MD  FIBER COMPLETE TABS Take 1 tablet by mouth daily.   Yes Historical Provider, MD  furosemide (LASIX) 20 MG tablet Take 20 mg by mouth daily. 11/11/15  Yes Historical Provider, MD  gabapentin (NEURONTIN) 300 MG capsule Take 1 capsule (300 mg total) by mouth at bedtime. 02/06/14  Yes Chauncey Cruel, MD  HYDROcodone-acetaminophen (NORCO/VICODIN)  5-325 MG per tablet Take 1 tablet by mouth every 6 (six) hours as needed for moderate pain. 08/17/13  Yes Minette Headland, NP  Menthol-Methyl Salicylate (ICY HOT) 90-24 % STCK Apply 1 application topically daily as needed (for pain).   Yes Historical Provider, MD  metFORMIN (GLUCOPHAGE) 500 MG tablet Take 500 mg by mouth daily. 11/08/15  Yes Historical Provider, MD  metoprolol succinate (TOPROL-XL) 50 MG 24 hr tablet Take 50 mg by mouth 2 (two) times daily.  12/19/12  Yes Historical Provider, MD  Multiple Vitamin (MULTIVITAMIN WITH MINERALS) TABS tablet Take 1 tablet by mouth daily.   Yes Historical Provider, MD  RESTASIS 0.05 %  ophthalmic emulsion Place 1 drop into both eyes 2 (two) times daily.  12/15/12  Yes Historical Provider, MD  DULoxetine (CYMBALTA) 20 MG capsule Take 1 capsule (20 mg total) by mouth daily. 08/17/13   Minette Headland, NP     No family history on file.  Social History   Social History  . Marital Status: Divorced    Spouse Name: N/A  . Number of Children: 1  . Years of Education: N/A   Occupational History  . retired    Social History Main Topics  . Smoking status: Never Smoker   . Smokeless tobacco: Never Used  . Alcohol Use: No  . Drug Use: No  . Sexual Activity: No   Other Topics Concern  . None   Social History Narrative     Review of Systems: A 12 point ROS discussed and pertinent positives are indicated in the HPI above.  All other systems are negative.  Review of Systems  Respiratory: Positive for shortness of breath. Negative for cough and chest tightness.   Cardiovascular: Negative for chest pain and palpitations.  Gastrointestinal: Negative.  Negative for abdominal pain and abdominal distention.  Genitourinary: Negative.   Musculoskeletal: Negative.   Neurological: Negative.     Vital Signs: BP 117/74 mmHg  Pulse 102  Temp(Src) 99 F (37.2 C) (Oral)  Resp 22  Ht '5\' 7"'  (1.702 m)  Wt 278 lb 9.6 oz (126.372 kg)  BMI 43.62 kg/m2  SpO2 93%  Physical Exam  Constitutional: She is oriented to person, place, and time. She appears distressed.  Pulmonary/Chest: She is in respiratory distress.  Musculoskeletal: She exhibits no edema.  Neurological: She is alert and oriented to person, place, and time.    Imaging: Dg Chest 2 View  11/29/2015  CLINICAL DATA:  Cough beginning Tuesday, progressive shortness of breath since yesterday, history hypertension, hyperlipidemia, uterine cancer, breast cancer EXAM: CHEST  2 VIEW COMPARISON:  01/05/2013 FINDINGS: Upper normal heart size. Atherosclerotic calcification aorta. Mediastinal contours and pulmonary vascularity  normal. Respiratory motion artifacts degrade diagnostic utility of lateral view. Lungs clear. No pulmonary infiltrate, pleural effusion or pneumothorax. Endplate spur formation thoracic spine. BILATERAL glenohumeral degenerative changes with degenerative disc disease changes thoracic spine. IMPRESSION: No acute abnormalities. Aortic atherosclerosis. Electronically Signed   By: Lavonia Dana M.D.   On: 11/29/2015 14:59   Ct Angio Chest Pe W/cm &/or Wo Cm  11/29/2015  CLINICAL DATA:  Cough and progressive shortness of breath. EXAM: CT ANGIOGRAPHY CHEST WITH CONTRAST TECHNIQUE: Multidetector CT imaging of the chest was performed using the standard protocol during bolus administration of intravenous contrast. Multiplanar CT image reconstructions and MIPs were obtained to evaluate the vascular anatomy. CONTRAST:  80 mL of Isovue 370 COMPARISON:  Chest x-ray from earlier today. CT scan of the chest February 08, 2013 FINDINGS:  The central airways are within normal limits. No pneumothorax identified. Scattered subsegmental atelectasis is identified. No pulmonary nodules, masses, or focal infiltrates are identified. Pulmonary emboli are identified. There is a large embolus in the left main pulmonary artery with emboli seen in the left upper and lower lobe branches. Pulmonary emboli are also seen on the right involving right upper, middle, and lower lobe branches. Embolus extends into the periphery of the right main pulmonary artery. The aorta and main pulmonary artery are similar in caliber measuring 3.7 cm. There is no definitive flattening of the intraventricular septum or deviation to the left. The right and left ventricles are similar in size. There is no reflux of contrast into the inferior vena cava. No effusions or adenopathy. Evaluation of the upper abdomen is limited due to arterial phase imaging with no acute abnormalities seen. Visualized bones demonstrate degenerative changes in the spine but no other  abnormalities. Review of the MIP images confirms the above findings. IMPRESSION: 1. There is a large burden of bilateral pulmonary emboli. Findings called to Dr. Daleen Bo Electronically Signed   By: Dorise Bullion III M.D   On: 11/29/2015 19:24    Labs:  CBC:  Recent Labs  11/29/15 1434 11/30/15 0205 12/01/15 0425 12/01/15 1534  WBC 7.9 9.1 8.2 8.9  HGB 14.2 13.4 13.0 13.5  HCT 44.0 43.6 41.8 43.3  PLT 136* 142* 137* 163    COAGS: No results for input(s): INR, APTT in the last 8760 hours.  BMP:  Recent Labs  09/24/15 1400 11/29/15 1434 11/30/15 0205 12/01/15 1534  NA 141 138 136 139  K 4.4 4.0 4.0 4.4  CL  --  107 108 108  CO2 26 21* 19* 23  GLUCOSE 173* 156* 199* 132*  BUN 20.'3 19 18 13  ' CALCIUM 10.1 9.6 9.2 9.3  CREATININE 1.2* 1.46* 1.55* 1.33*  GFRNONAA  --  34* 32* 39*  GFRAA  --  40* 37* 45*    LIVER FUNCTION TESTS:  Recent Labs  09/24/15 1400  BILITOT 0.45  AST 13  ALT 21  ALKPHOS 90  PROT 7.4  ALBUMIN 3.6     Assessment and Plan:  Met with [name] and her family.  Family feels that breathing at rest is more labored today.  Not improving significantly over last 2 days on IV heparin.  O2 requirement, tachycardia and dyspnea at rest.  Given echo findings today of right heart strain and CTA findings on 7/8, she would benefit from bilateral EKOS ultrasound-assisted thrombolytic therapy of both pulmonary arteries for an initial 12 hour infusion.  Given that the initial event may have even occurred on Thur, I recommended we start thrombolytic therapy tonight.  After discussing risks and benefits, [name] would like to proceed with thrombolytic therapy.    Her malignancy history was limited early stage 1A left breast carcinoma treated with lumpectomy, radiation and chronic Aromasin in 2014.  Also noninvasive grade 1 endometrial CA treated with TAH/BSO in 2014.  Risk of metastatic disease, and in particular brain mets, is miniscule.  I don't  think we need to do any brain imaging prior to starting thrombolytic therapy.  No history of GI bleeds, ulcers, bleeding disorder.  Will proceed with pulmonary arteriography, pressure measurements and bilateral EKOS-USAT tonight.  Discussed with Dr. Lamonte Sakai of [facility], who will facilitate transfer to MICU immediately following initiation of thrombolytic therapy.  Thank you for this interesting consult.  I greatly enjoyed meeting [name] and look forward  to participating in their care.  A copy of this report was sent to the requesting provider on this date.  Electronically SignedAletta Edouard T 12/01/2015, 5:32 PM   I spent a total of 40 Minutes in face to face in clinical consultation, greater than 50% of which was counseling/coordinating care for treatment of bilateral submassive pulmonary embolism.

## 2015-12-01 NOTE — Sedation Documentation (Signed)
PA pressure 64/30, M (44)

## 2015-12-02 ENCOUNTER — Inpatient Hospital Stay (HOSPITAL_COMMUNITY): Payer: Medicare Other

## 2015-12-02 ENCOUNTER — Ambulatory Visit: Payer: Medicare Other | Admitting: Physical Therapy

## 2015-12-02 ENCOUNTER — Other Ambulatory Visit (HOSPITAL_COMMUNITY): Payer: Medicare Other

## 2015-12-02 DIAGNOSIS — I2699 Other pulmonary embolism without acute cor pulmonale: Secondary | ICD-10-CM

## 2015-12-02 LAB — FIBRINOGEN
Fibrinogen: 348 mg/dL (ref 204–475)
Fibrinogen: 342 mg/dL (ref 204–475)
Fibrinogen: 330 mg/dL (ref 204–475)

## 2015-12-02 LAB — CBC
HEMATOCRIT: 40.9 % (ref 36.0–46.0)
HEMATOCRIT: 40.3 % (ref 36.0–46.0)
HEMATOCRIT: 38.9 % (ref 36.0–46.0)
HEMOGLOBIN: 12.6 g/dL (ref 12.0–15.0)
HEMOGLOBIN: 12.1 g/dL (ref 12.0–15.0)
Hemoglobin: 12.2 g/dL (ref 12.0–15.0)
MCH: 26.9 pg (ref 26.0–34.0)
MCH: 26.5 pg (ref 26.0–34.0)
MCH: 26.9 pg (ref 26.0–34.0)
MCHC: 30.8 g/dL (ref 30.0–36.0)
MCHC: 30.3 g/dL (ref 30.0–36.0)
MCHC: 31.1 g/dL (ref 30.0–36.0)
MCV: 87.4 fL (ref 78.0–100.0)
MCV: 87.4 fL (ref 78.0–100.0)
MCV: 86.6 fL (ref 78.0–100.0)
Platelets: 124 10*3/uL — ABNORMAL LOW (ref 150–400)
Platelets: 123 10*3/uL — ABNORMAL LOW (ref 150–400)
Platelets: 123 10*3/uL — ABNORMAL LOW (ref 150–400)
RBC: 4.68 MIL/uL (ref 3.87–5.11)
RBC: 4.61 MIL/uL (ref 3.87–5.11)
RBC: 4.49 MIL/uL (ref 3.87–5.11)
RDW: 15.3 % (ref 11.5–15.5)
RDW: 15.3 % (ref 11.5–15.5)
RDW: 15.3 % (ref 11.5–15.5)
WBC: 8 10*3/uL (ref 4.0–10.5)
WBC: 7.3 10*3/uL (ref 4.0–10.5)
WBC: 7.6 10*3/uL (ref 4.0–10.5)

## 2015-12-02 LAB — BASIC METABOLIC PANEL
ANION GAP: 8 (ref 5–15)
BUN: 12 mg/dL (ref 6–20)
CHLORIDE: 108 mmol/L (ref 101–111)
CO2: 23 mmol/L (ref 22–32)
Calcium: 9.3 mg/dL (ref 8.9–10.3)
Creatinine, Ser: 1.24 mg/dL — ABNORMAL HIGH (ref 0.44–1.00)
GFR calc non Af Amer: 42 mL/min — ABNORMAL LOW (ref >60)
GFR, EST AFRICAN AMERICAN: 49 mL/min — AB (ref >60)
GLUCOSE: 127 mg/dL — AB (ref 65–99)
POTASSIUM: 4.3 mmol/L (ref 3.5–5.1)
Sodium: 139 mmol/L (ref 135–145)

## 2015-12-02 LAB — HEPARIN LEVEL (UNFRACTIONATED)
HEPARIN UNFRACTIONATED: 0.15 [IU]/mL — AB (ref 0.30–0.70)
HEPARIN UNFRACTIONATED: 0.42 [IU]/mL (ref 0.30–0.70)
HEPARIN UNFRACTIONATED: 0.47 [IU]/mL (ref 0.30–0.70)
Heparin Unfractionated: 0.28 IU/mL — ABNORMAL LOW (ref 0.30–0.70)

## 2015-12-02 LAB — GLUCOSE, CAPILLARY
GLUCOSE-CAPILLARY: 138 mg/dL — AB (ref 65–99)
GLUCOSE-CAPILLARY: 188 mg/dL — AB (ref 65–99)
Glucose-Capillary: 152 mg/dL — ABNORMAL HIGH (ref 65–99)

## 2015-12-02 MED ORDER — METOPROLOL TARTRATE 5 MG/5ML IV SOLN
2.5000 mg | INTRAVENOUS | Status: DC | PRN
  Administered 2015-12-02: 2.5 mg via INTRAVENOUS
  Administered 2015-12-02: 4 mg via INTRAVENOUS
  Filled 2015-12-02 (×2): qty 5

## 2015-12-02 NOTE — Progress Notes (Signed)
[place] for heparin Indication: pulmonary embolus   Allergies  Allergen Reactions  . Iodine Rash    Patient Measurements: Height: 5\' 7"  (170.2 cm) Weight: 278 lb 9.6 oz (126.372 kg) IBW/kg (Calculated) : 61.6 Heparin Dosing Weight: 89 kg  Vital Signs: Temp: 97.5 F (36.4 C) (07/11 0405) Temp Source: Oral (07/11 0405) BP: 116/74 mmHg (07/11 0400) Pulse Rate: 96 (07/11 0400)  Labs:  Recent Labs  11/30/15 0205  12/01/15 0429 12/01/15 1534 12/01/15 2125 12/02/15 0340  HGB 13.4  < >  --  13.5 12.4 12.6  HCT 43.6  < >  --  43.3 40.7 40.9  PLT 142*  < >  --  163 144* 124*  HEPARINUNFRC 0.94*  < > 0.63  --  0.40 0.15*  CREATININE 1.55*  --   --  1.33*  --  1.24*  TROPONINI  --   --   --  0.03*  --   --   < > = values in this interval not displayed.  Estimated Creatinine Clearance: 55.8 mL/min (by C-G formula based on Cr of 1.24).  Assessment: 74 yo female with PE now undergoing EKOS on tPa, for heparin.  Heparin level low this morning, but heparin was infusing at a lower rate following IR, and infusion increased back to 1100 units/hr at ~ 0200.   Goal of Therapy:  Heparin level 0.3-0.7 units/ml Monitor platelets by anticoagulation protocol: Yes   Plan:  Continue heparin gtt at 1100 units/hr Heparin level at [place], PharmD, BCPS   12/02/2015 4:41 AM

## 2015-12-02 NOTE — Progress Notes (Signed)
*  Preliminary Results* Bilateral lower extremity venous duplex completed. Study was technically limited due to patient position and patient body habitus. Bilateral lower extremities are positive for acute deep vein thrombosis involving the right femoral, right profunda femoral, right popliteal, and left femoral veins. Unable to exclude DVT in segments which were not visualized due to technical limitations. There is no evidence of Baker's cyst bilaterally.  Preliminary results discussed with Dr. Halford Chessman.  12/02/2015 12:08 PM  Maudry Mayhew, RVT, RDCS, RDMS

## 2015-12-02 NOTE — Progress Notes (Signed)
Referring Physician(s): Dr Chesley Mires  Supervising Physician: Sandi Mariscal  Patient Status:  Inpatient  Chief Complaint:  Saddle PE  PE thrombolysis 7/10 pm in IR   Subjective:  Pt some better today Can speak/converse with less effort EKOS catheter in place Rt groin Has noted some slight nose bleeds since this am Resolving and blames oxygen for same  Allergies: Iodine  Medications: Prior to Admission medications   Medication Sig Start Date End Date Taking? Authorizing Provider  buPROPion (WELLBUTRIN SR) 150 MG 12 hr tablet Take 150 mg by mouth 2 (two) times daily. 11/11/15  Yes Historical Provider, MD  carisoprodol (SOMA) 350 MG tablet Take 350 mg by mouth every 6 (six) hours.  12/15/12  Yes Historical Provider, MD  Diclofenac Sodium (PENNSAID) 1.5 % SOLN Place 10 drops onto the skin 4 (four) times daily. For pain   Yes Historical Provider, MD  exemestane (AROMASIN) 25 MG tablet Take 1 tablet (25 mg total) by mouth daily. 09/24/15  Yes Chauncey Cruel, MD  fenofibrate 160 MG tablet Take 160 mg by mouth daily. 10/02/15  Yes Historical Provider, MD  FIBER COMPLETE TABS Take 1 tablet by mouth daily.   Yes Historical Provider, MD  furosemide (LASIX) 20 MG tablet Take 20 mg by mouth daily. 11/11/15  Yes Historical Provider, MD  gabapentin (NEURONTIN) 300 MG capsule Take 1 capsule (300 mg total) by mouth at bedtime. 02/06/14  Yes Chauncey Cruel, MD  HYDROcodone-acetaminophen (NORCO/VICODIN) 5-325 MG per tablet Take 1 tablet by mouth every 6 (six) hours as needed for moderate pain. 08/17/13  Yes Minette Headland, NP  Menthol-Methyl Salicylate (ICY HOT) Q000111Q % STCK Apply 1 application topically daily as needed (for pain).   Yes Historical Provider, MD  metFORMIN (GLUCOPHAGE) 500 MG tablet Take 500 mg by mouth daily. 11/08/15  Yes Historical Provider, MD  metoprolol succinate (TOPROL-XL) 50 MG 24 hr tablet Take 50 mg by mouth 2 (two) times daily.  12/19/12  Yes Historical Provider,  MD  Multiple Vitamin (MULTIVITAMIN WITH MINERALS) TABS tablet Take 1 tablet by mouth daily.   Yes Historical Provider, MD  RESTASIS 0.05 % ophthalmic emulsion Place 1 drop into both eyes 2 (two) times daily.  12/15/12  Yes Historical Provider, MD  DULoxetine (CYMBALTA) 20 MG capsule Take 1 capsule (20 mg total) by mouth daily. 08/17/13   Minette Headland, NP     Vital Signs: BP 132/87 mmHg  Pulse 97  Temp(Src) 98.7 F (37.1 C) (Oral)  Resp 26  Ht 5\' 7"  (1.702 m)  Wt 278 lb 9.6 oz (126.372 kg)  BMI 43.62 kg/m2  SpO2 94%  Physical Exam  Constitutional: She is oriented to person, place, and time. She appears well-nourished.  Cardiovascular: Normal rate and regular rhythm.   Pulmonary/Chest: She has wheezes.  Abdominal: Soft.  Musculoskeletal: Normal range of motion.  Neurological: She is alert and oriented to person, place, and time.  Skin: Skin is warm.  Right groin sit clean and dry NT no bleeding No hematoma Rt foot 2+ pulses  Nursing note and vitals reviewed.   Imaging: Dg Chest 2 View  11/29/2015  CLINICAL DATA:  Cough beginning Tuesday, progressive shortness of breath since yesterday, history hypertension, hyperlipidemia, uterine cancer, breast cancer EXAM: CHEST  2 VIEW COMPARISON:  01/05/2013 FINDINGS: Upper normal heart size. Atherosclerotic calcification aorta. Mediastinal contours and pulmonary vascularity normal. Respiratory motion artifacts degrade diagnostic utility of lateral view. Lungs clear. No pulmonary infiltrate, pleural effusion or pneumothorax.  Endplate spur formation thoracic spine. BILATERAL glenohumeral degenerative changes with degenerative disc disease changes thoracic spine. IMPRESSION: No acute abnormalities. Aortic atherosclerosis. Electronically Signed   By: Lavonia Dana M.D.   On: 11/29/2015 14:59   Ct Angio Chest Pe W/cm &/or Wo Cm  11/29/2015  CLINICAL DATA:  Cough and progressive shortness of breath. EXAM: CT ANGIOGRAPHY CHEST WITH CONTRAST  TECHNIQUE: Multidetector CT imaging of the chest was performed using the standard protocol during bolus administration of intravenous contrast. Multiplanar CT image reconstructions and MIPs were obtained to evaluate the vascular anatomy. CONTRAST:  80 mL of Isovue 370 COMPARISON:  Chest x-ray from earlier today. CT scan of the chest February 08, 2013 FINDINGS: The central airways are within normal limits. No pneumothorax identified. Scattered subsegmental atelectasis is identified. No pulmonary nodules, masses, or focal infiltrates are identified. Pulmonary emboli are identified. There is a large embolus in the left main pulmonary artery with emboli seen in the left upper and lower lobe branches. Pulmonary emboli are also seen on the right involving right upper, middle, and lower lobe branches. Embolus extends into the periphery of the right main pulmonary artery. The aorta and main pulmonary artery are similar in caliber measuring 3.7 cm. There is no definitive flattening of the intraventricular septum or deviation to the left. The right and left ventricles are similar in size. There is no reflux of contrast into the inferior vena cava. No effusions or adenopathy. Evaluation of the upper abdomen is limited due to arterial phase imaging with no acute abnormalities seen. Visualized bones demonstrate degenerative changes in the spine but no other abnormalities. Review of the MIP images confirms the above findings. IMPRESSION: 1. There is a large burden of bilateral pulmonary emboli. Findings called to Dr. Daleen Bo Electronically Signed   By: Dorise Bullion III M.D   On: 11/29/2015 19:24    Labs:  CBC:  Recent Labs  12/01/15 0425 12/01/15 1534 12/01/15 2125 12/02/15 0340  WBC 8.2 8.9 7.8 8.0  HGB 13.0 13.5 12.4 12.6  HCT 41.8 43.3 40.7 40.9  PLT 137* 163 144* 124*    COAGS: No results for input(s): INR, APTT in the last 8760 hours.  BMP:  Recent Labs  11/29/15 1434 11/30/15 0205  12/01/15 1534 12/02/15 0340  NA 138 136 139 139  K 4.0 4.0 4.4 4.3  CL 107 108 108 108  CO2 21* 19* 23 23  GLUCOSE 156* 199* 132* 127*  BUN 19 18 13 12   CALCIUM 9.6 9.2 9.3 9.3  CREATININE 1.46* 1.55* 1.33* 1.24*  GFRNONAA 34* 32* 39* 42*  GFRAA 40* 37* 45* 49*    LIVER FUNCTION TESTS:  Recent Labs  09/24/15 1400  BILITOT 0.45  AST 13  ALT 21  ALKPHOS 90  PROT 7.4  ALBUMIN 3.6    Assessment and Plan:  Saddle PE Thrombolysis started last pm- now finished For recheck in IR today Feels some better Nose bleed---resolving  Electronically Signed: Caleel Kiner A 12/02/2015, 8:56 AM   I spent a total of 15 Minutes at the the patient's bedside AND on the patient's hospital floor or unit, greater than 50% of which was counseling/coordinating care for PE thrombolysis

## 2015-12-02 NOTE — Progress Notes (Signed)
Pt attempted to get out of bed around 1730. When asked orientation questions, she could tell me her location, DOB, and the reason she was admitted but was unsure of the day of the week. Pupils equal and reactive. Heart rate was 141. Pt denies SOB. MD made aware, NP came to bedside to do neuro exam. EKG obtained but pt had returned to normal sinus rhythm. Pt became confused again around 1830, calling out. Heart rate was again in the 140s but still denied SOB. NP made aware, EKG obtained and tachycardia captured. Orders given for PRN metoprolol. Pt observed talking to herself when left alone in her room. Bed alarm on. Will continue to closely monitor.

## 2015-12-02 NOTE — Procedures (Signed)
Completion of 12 hr bilateral US assisted pulmonary arterial lysis procedure without significant change in main PA pressures.  Pre procedural main PA: 63/31 (mean - 44)  Post procedural main PA: 64/30 (mean 44)  Hemostasis at right groin site achieved with manual compression.  Keep right leg straight for 2 hrs.  Ronny Bacon, MD Pager #: [phone]

## 2015-12-02 NOTE — Progress Notes (Signed)
[place] for heparin Indication: pulmonary embolus  Allergies  Allergen Reactions  . Iodine Rash    Patient Measurements: Height: 5\' 7"  (170.2 cm) Weight: 278 lb 9.6 oz (126.372 kg) IBW/kg (Calculated) : 61.6 Heparin Dosing Weight: 89 kg  Vital Signs: Temp: 97.8 F (36.6 C) (07/11 1947) Temp Source: Oral (07/11 1947) BP: 137/112 mmHg (07/11 1800) Pulse Rate: 140 (07/11 1828)  Labs:  Recent Labs  11/30/15 0205  12/01/15 1534  12/02/15 0340 12/02/15 0945 12/02/15 1600 12/02/15 2020  HGB 13.4  < > 13.5  < > 12.6 12.2 12.1  --   HCT 43.6  < > 43.3  < > 40.9 40.3 38.9  --   PLT 142*  < > 163  < > 124* 123* 123*  --   HEPARINUNFRC 0.94*  < >  --   < > 0.15* 0.28* 0.42 0.47  CREATININE 1.55*  --  1.33*  --  1.24*  --   --   --   TROPONINI  --   --  0.03*  --   --   --   --   --   < > = values in this interval not displayed.  Estimated Creatinine Clearance: 55.8 mL/min (by C-G formula based on Cr of 1.24).   Medical History: Past Medical History  Diagnosis Date  . Hyperlipidemia   . Hypertension   . Cold     just started 01/02/13 cough  . Complication of anesthesia     very phobic about needles.last surgery had to sedate before doing iv  . Exertional shortness of breath   . Uterine cancer ([place])   . Chronic lower back pain   . Depression   . Arthritis     "joints; mostly on the right side" (01/11/2013)  . Breast cancer ([place])     "left" (01/11/2013)  . Cancer ([place])     endometrial  . Allergy   . Osteoporosis     knees   . Status post radiation therapy within last four weeks 03/19/13-05/03/13    lt breast 60.4GY    Assessment: 75 yo female on heparin gtt for bilateral PE. Pt completed catheter-directed thrombolysis this morning. Catheters have been pulled. Systemic heparin has been running, patient's last two levels have been therapeutic after rate increase. Hgb and plts stable.  Goal of Therapy:  Heparin level 0.3-0.7  units/ml Monitor platelets by anticoagulation protocol: Yes  Plan:  Continue heparin gtt at 1,350 units/hr Monitor daily HL, CBC, s/s of bleed   Elenor Quinones, PharmD, [facility] Clinical Pharmacist Pager [phone] 12/02/2015 8:58 PM

## 2015-12-02 NOTE — Progress Notes (Signed)
Patient was educated about keeping the right leg with sheath straight, but continues to move leg.  Patient is noncompliant with education and continues to move herself in bed and puts weight on the right leg.

## 2015-12-02 NOTE — Progress Notes (Signed)
Pt with pulmonary Embolus-Pt was on unit 3w and benefits check completed for Eliquis vs Xarelto: Co pay for Eliquis 2.5/5 mg.BID $29.00  Xeralto 20mg  daily. $29.10 . No-pre-auth required.  Pharm[facility] in network are: [facility].

## 2015-12-02 NOTE — Progress Notes (Signed)
PULMONARY / CRITICAL CARE MEDICINE   Name: [name] MRN: [id number] DOB: 1941-03-17    ADMISSION DATE:  11/29/2015 CONSULTATION DATE:  12/01/15  REFERRING MD:  Dr. Coralyn Pear [facility])  CHIEF COMPLAINT:  New PE  HISTORY OF PRESENT ILLNESS:   [name] is a 75 y.o. female with medical history significant of HTN, HLD, breast cancer s/p lumpectomy/radiation, hysterectomy/BSO for endometrial cancer, and arthritis; who presents with complaints of shortness of breath. Symptoms started approximately 11/25/15 with dry cough and progressed to shortness of breath. On admission to the hospital a CTA was performed and revealed bilateral pulmonary emboli involving the left upper and lower lobe branches and the right upper, middle and lower lobe branches. ECHO reveals mildly dilated right ventricle with moderately reduced systolic function. PCCM is consulted for shortness and breath and consideration for EKOS.  PAST MEDICAL HISTORY :  She  has a past medical history of Hyperlipidemia; Hypertension; Cold; Complication of anesthesia; Exertional shortness of breath; Uterine cancer ([place]); Chronic lower back pain; Depression; Arthritis; Breast cancer ([place]); Cancer ([place]); Allergy; Osteoporosis; and Status post radiation therapy within last four weeks (03/19/13-05/03/13).  PAST SURGICAL HISTORY: She  has past surgical history that includes Replacement total knee (Bilateral, 2011-2012); Foot Tendon Surgery (Right); Breast lumpectomy with needle localization and axillary sentinel lymph node bx (Left, 01/11/2013); Breast biopsy (Left, 12/2012); Abdominal hysterectomy (12/14/2012); Dilation and curettage of uterus; Tubal ligation; Cholecystectomy (1970); Knee arthroscopy (Right, 1990's); and Breast lumpectomy with needle localization and axillary sentinel lymph node bx (Left, 01/11/2013).  Allergies  Allergen Reactions  . Iodine Rash    No current facility-administered medications on file prior to encounter.    Current Outpatient Prescriptions on File Prior to Encounter  Medication Sig  . carisoprodol (SOMA) 350 MG tablet Take 350 mg by mouth every 6 (six) hours.   . Diclofenac Sodium (PENNSAID) 1.5 % SOLN Place 10 drops onto the skin 4 (four) times daily. For pain  . exemestane (AROMASIN) 25 MG tablet Take 1 tablet (25 mg total) by mouth daily.  Marland Kitchen gabapentin (NEURONTIN) 300 MG capsule Take 1 capsule (300 mg total) by mouth at bedtime.  Marland Kitchen HYDROcodone-acetaminophen (NORCO/VICODIN) 5-325 MG per tablet Take 1 tablet by mouth every 6 (six) hours as needed for moderate pain.  . metoprolol succinate (TOPROL-XL) 50 MG 24 hr tablet Take 50 mg by mouth 2 (two) times daily.   . Multiple Vitamin (MULTIVITAMIN WITH MINERALS) TABS tablet Take 1 tablet by mouth daily.  . RESTASIS 0.05 % ophthalmic emulsion Place 1 drop into both eyes 2 (two) times daily.   . DULoxetine (CYMBALTA) 20 MG capsule Take 1 capsule (20 mg total) by mouth daily.    FAMILY HISTORY:  Her has no family status information on file.   SOCIAL HISTORY: She  reports that she has never smoked. She has never used smokeless tobacco. She reports that she does not drink alcohol or use illicit drugs.  REVIEW OF SYSTEMS:   Positive for fatigue, shortness of breath, mild chest pain, body aches Negative for headaches, changes in vision.  SUBJECTIVE:  Pt states she is very tired but is otherwise doing fine. She notes mild improvement in her shortness of breath. She endorses mild chest pain. She would like her lab draws to be minimized. Pt also noticed that she started having a bloody nose this morning.  VITAL SIGNS: BP 132/87 mmHg  Pulse 97  Temp(Src) 97.5 F (36.4 C) (Oral)  Resp 26  Ht 5\' 7"  (1.702 m)  Wt 126.372 kg (278 lb 9.6 oz)  BMI 43.62 kg/m2  SpO2 94%  HEMODYNAMICS:    VENTILATOR SETTINGS:    INTAKE / OUTPUT: I/O last 3 completed shifts: In: 2131 [P.O.:555; I.V.:1576] Out: 700 [Urine:700]  PHYSICAL  EXAMINATION: General: Morbidly obese elderly female, lying awake in bed, pleasant and conversational Neuro: Alert and oriented x 4, no focal deficits HEENT: WNL, no stridor, dry mucous membranes, mild epistaxis Cardiovascular: S1S2, RRR, no murmurs Lungs: Distant lung sounds secondary body habitus, no obvious crackles or wheezes Abdomen: +BS, soft, non-tender, no rebound or guarding. Musculoskeletal: Mild pretibial edema, no erythema, no tenderness to palpation of calves, Homan's sign negative bilaterally Skin: no rashes   LABS:  BMET  Recent Labs Lab 11/30/15 0205 12/01/15 1534 12/02/15 0340  NA 136 139 139  K 4.0 4.4 4.3  CL 108 108 108  CO2 19* 23 23  BUN 18 13 12   CREATININE 1.55* 1.33* 1.24*  GLUCOSE 199* 132* 127*    Electrolytes  Recent Labs Lab 11/30/15 0205 12/01/15 1534 12/02/15 0340  CALCIUM 9.2 9.3 9.3    CBC  Recent Labs Lab 12/01/15 1534 12/01/15 2125 12/02/15 0340  WBC 8.9 7.8 8.0  HGB 13.5 12.4 12.6  HCT 43.3 40.7 40.9  PLT 163 144* 124*    Coag's No results for input(s): APTT, INR in the last 168 hours.  Sepsis Markers No results for input(s): LATICACIDVEN, PROCALCITON, O2SATVEN in the last 168 hours.  ABG No results for input(s): PHART, PCO2ART, PO2ART in the last 168 hours.  Liver Enzymes No results for input(s): AST, ALT, ALKPHOS, BILITOT, ALBUMIN in the last 168 hours.  Cardiac Enzymes  Recent Labs Lab 12/01/15 1534  TROPONINI 0.03*    Glucose No results for input(s): GLUCAP in the last 168 hours.  Imaging No results found.   STUDIES:  11/29/15 CXR: No acute abnormalities 11/29/15 CTA: There is a large burden of bilateral pulmonary emboli. 12/01/15 ECHO: EF 55-60%, LA mildly dilated, RV mildly dilated with moderately reduce systolic function, RA moderately dilated, PA peak pressure 61mmHg  CULTURES: None  ANTIBIOTICS: None  SIGNIFICANT EVENTS: 7/10: Bilateral pulmonary angiography and EKOS US-assisted  pulmonary thrombolytic therapy  LINES/TUBES: PIV 7/9 >>  DISCUSSION: [name] is a 75 y.o. female with medical history significant of HTN, HLD, breast cancer s/p lumpectomy/radiation, hysterectomy/BSO for endometrial cancer, and arthritis; who presents with complaints of shortness of breath and dry cough. Found to have bilateral pulmonary emboli involving the left upper and lower lobe branches and the right upper, middle and lower lobe branches with ECHO showing right heart strain. Underwent bilateral pulmonary angiography and EKOS US-assisted pulmonary thrombolytic therapy with IR on 7/10. Angiography showed significant pulmonary arterial thrombus burden bilaterally and markedly elevated pulmonary artery pressure measurements. She was admitted to the ICU for tPA.   ASSESSMENT / PLAN:  PULMONARY A: Multiple bilateral pulmonary emboli with right heart strain s/p bilateral pulmonary angiography and EKOS US-assisted pulmonary thrombolytic therapy 7/10. Orthopnea P:  O2 to maintain sats > 92% IR following. tPA infusion x 12 hours. Re-check PA pressures this morning. 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 P:  Clear liquid diet. Advance  as tolerated Continue Protonix Zofran as needed for nausea  HEMATOLOGIC A:  PE- possible provoked in the setting of exemestane use and sedentary lifestyle, possible malignancy with history of breast cancer and endometrial cancer P:  Duplex US of BLE Heparin gtt per pharmacy Consider hypercoag workup outpatient  INFECTIOUS A:  No acute issues P:  Monitor CBC and fever curve No  indication for abx at this time  ENDOCRINE A:  Type II DM- last A1c 6.2% in 2011 P:  A1C ordered Accuchecks AC/HS Moderate SSI Holding Metformin in the setting of CKD and receiving contrast in IR  NEUROLOGIC A:  No acute issues P:  Monitor neuro status Low threshold to image after TPA   Hyman Bible, MD Family Medicine, PGY-2  12/02/2015, 8:27 AM   Chest pain and breathing better, but not back to baseline.  Has trouble breathing when laying flat.  Intermittent epistaxis.  Alert.  Normal strength.  No wheeze.  HR regular.  Abd soft.  1+ edema.  Doppler legs >> b/l DVT  PLT 123.  Hb 12.6.  Assessment/plan:  Acute PE with b/l lower leg DVT s/p EKOS. - continue heparin gtt for now - consider transition to oral anticoagulation 7/12 - defer IVC filter  Epistaxis. - monitor - f/u Hb  Possible OSA. - needs outpt sleep evaluation  Chesley Mires, MD [place] 12/02/2015, 1:57 PM Pager:  [phone] After 3pm call: [phone]

## 2015-12-02 NOTE — Progress Notes (Signed)
[place] for heparin Indication: pulmonary embolus  Allergies  Allergen Reactions  . Iodine Rash    Patient Measurements: Height: 5\' 7"  (170.2 cm) Weight: 278 lb 9.6 oz (126.372 kg) IBW/kg (Calculated) : 61.6 Heparin Dosing Weight: 89 kg  Vital Signs: Temp: 98.7 F (37.1 C) (07/11 0838) Temp Source: Oral (07/11 0838) BP: 132/87 mmHg (07/11 0800) Pulse Rate: 97 (07/11 0800)  Labs:  Recent Labs  11/30/15 0205  12/01/15 1534 12/01/15 2125 12/02/15 0340 12/02/15 0945  HGB 13.4  < > 13.5 12.4 12.6 12.2  HCT 43.6  < > 43.3 40.7 40.9 40.3  PLT 142*  < > 163 144* 124* 123*  HEPARINUNFRC 0.94*  < >  --  0.40 0.15* 0.28*  CREATININE 1.55*  --  1.33*  --  1.24*  --   TROPONINI  --   --  0.03*  --   --   --   < > = values in this interval not displayed.  Estimated Creatinine Clearance: 55.8 mL/min (by C-G formula based on Cr of 1.24).   Medical History: Past Medical History  Diagnosis Date  . Hyperlipidemia   . Hypertension   . Cold     just started 01/02/13 cough  . Complication of anesthesia     very phobic about needles.last surgery had to sedate before doing iv  . Exertional shortness of breath   . Uterine cancer ([place])   . Chronic lower back pain   . Depression   . Arthritis     "joints; mostly on the right side" (01/11/2013)  . Breast cancer ([place])     "left" (01/11/2013)  . Cancer ([place])     endometrial  . Allergy   . Osteoporosis     knees   . Status post radiation therapy within last four weeks 03/19/13-05/03/13    lt breast 60.4GY    Assessment: 75 yo female on heparin gtt for bilateral PE. Pt completed catheter-directed thrombolysis this morning. Catheters have been pulled. Systemic heparin has been running, patient's most recent level was SUBtherapeutic. hgb stable, plts slightly low. There has been reports of epistaxis during admission, RN reports mostly resolved.   Goal of Therapy:  Heparin level 0.3-0.7  units/ml Monitor platelets by anticoagulation protocol: Yes    Plan:  -Increase heparin to 1350 units/hr -Daily HL, CBC -Check level this afternoon  -F/u plan for PO anticoagulation    Hughes Better, PharmD, BCPS Clinical Pharmacist 12/02/2015 11:39 AM

## 2015-12-03 ENCOUNTER — Other Ambulatory Visit (HOSPITAL_COMMUNITY): Payer: Medicare Other

## 2015-12-03 LAB — CBC
HEMATOCRIT: 37.4 % (ref 36.0–46.0)
HEMOGLOBIN: 11.8 g/dL — AB (ref 12.0–15.0)
MCH: 27.3 pg (ref 26.0–34.0)
MCHC: 31.6 g/dL (ref 30.0–36.0)
MCV: 86.4 fL (ref 78.0–100.0)
Platelets: 117 10*3/uL — ABNORMAL LOW (ref 150–400)
RBC: 4.33 MIL/uL (ref 3.87–5.11)
RDW: 15.4 % (ref 11.5–15.5)
WBC: 7.3 10*3/uL (ref 4.0–10.5)

## 2015-12-03 LAB — HEPARIN LEVEL (UNFRACTIONATED): HEPARIN UNFRACTIONATED: 0.67 [IU]/mL (ref 0.30–0.70)

## 2015-12-03 MED ORDER — APIXABAN 5 MG PO TABS: 5.0000 mg | ORAL_TABLET | Freq: Two times a day (BID) | ORAL | Status: DC

## 2015-12-03 MED ORDER — APIXABAN 5 MG PO TABS
10.0000 mg | ORAL_TABLET | Freq: Two times a day (BID) | ORAL | Status: DC
  Administered 2015-12-03 – 2015-12-09 (×12): 10 mg via ORAL
  Filled 2015-12-03 (×13): qty 2

## 2015-12-03 NOTE — Progress Notes (Addendum)
[place] for heparin Indication: pulmonary embolus  Allergies  Allergen Reactions  . Iodine Rash    Patient Measurements: Height: 5\' 7"  (170.2 cm) Weight: 289 lb 3.9 oz (131.2 kg) IBW/kg (Calculated) : 61.6 Heparin Dosing Weight: 89 kg  Vital Signs: Temp: 98.1 F (36.7 C) (07/12 0447) Temp Source: Oral (07/12 0447) BP: 104/69 mmHg (07/12 0600) Pulse Rate: 89 (07/12 0600)  Labs:  Recent Labs  12/01/15 1534  12/02/15 0340 12/02/15 0945 12/02/15 1600 12/02/15 2020 12/03/15 0331  HGB 13.5  < > 12.6 12.2 12.1  --  11.8*  HCT 43.3  < > 40.9 40.3 38.9  --  37.4  PLT 163  < > 124* 123* 123*  --  PENDING  HEPARINUNFRC  --   < > 0.15* 0.28* 0.42 0.47 0.67  CREATININE 1.33*  --  1.24*  --   --   --   --   TROPONINI 0.03*  --   --   --   --   --   --   < > = values in this interval not displayed.  Estimated Creatinine Clearance: 57 mL/min (by C-G formula based on Cr of 1.24).   Medical History: Past Medical History  Diagnosis Date  . Hyperlipidemia   . Hypertension   . Cold     just started 01/02/13 cough  . Complication of anesthesia     very phobic about needles.last surgery had to sedate before doing iv  . Exertional shortness of breath   . Uterine cancer ([place])   . Chronic lower back pain   . Depression   . Arthritis     "joints; mostly on the right side" (01/11/2013)  . Breast cancer ([place])     "left" (01/11/2013)  . Cancer ([place])     endometrial  . Allergy   . Osteoporosis     knees   . Status post radiation therapy within last four weeks 03/19/13-05/03/13    lt breast 60.4GY    Assessment: 75 yo female on heparin gtt for bilateral PE. Pt completed catheter-directed thrombolysis yesterday. Pt is on systemic heparin, heparin levels are therapeutic. hgb stable, plts slightly low. There has been reports of epistaxis during admission, RN reports mostly resolved. Lower extremity dopplers were completed yesterday and are  positive for DVT bilaterally.   Goal of Therapy:  Heparin level 0.3-0.7 units/ml Monitor platelets by anticoagulation protocol: Yes    Plan:  -Continue heparin at 1350 units/hr -Daily HL, CBC -F/u plan for PO anticoagulation    Hughes Better, PharmD, BCPS Clinical Pharmacist 12/03/2015 7:22 AM    Addendum -Will begin Eliquis tonight -Eilquis 10 mg po bid x 7 days then 5 mg po bid -Monitor s/sx bleeding, pt was previously having epistaxis, now resolved   Harvel Quale  12/03/2015 10:11 AM

## 2015-12-03 NOTE — Progress Notes (Signed)
Referring Physician(s): Dr Chesley Mires  Supervising Physician: Corrie Mckusick  Patient Status:  Inpatient  Chief Complaint:  [place] PE ekos thrombolysis performed 7/10-11  Subjective:  Breathing better today Better daily Discharge on po anticoagulation per chart  Allergies: Iodine  Medications: Prior to Admission medications   Medication Sig Start Date End Date Taking? Authorizing Provider  buPROPion (WELLBUTRIN SR) 150 MG 12 hr tablet Take 150 mg by mouth 2 (two) times daily. 11/11/15  Yes Historical Provider, MD  carisoprodol (SOMA) 350 MG tablet Take 350 mg by mouth every 6 (six) hours.  12/15/12  Yes Historical Provider, MD  Diclofenac Sodium (PENNSAID) 1.5 % SOLN Place 10 drops onto the skin 4 (four) times daily. For pain   Yes Historical Provider, MD  exemestane (AROMASIN) 25 MG tablet Take 1 tablet (25 mg total) by mouth daily. 09/24/15  Yes Chauncey Cruel, MD  fenofibrate 160 MG tablet Take 160 mg by mouth daily. 10/02/15  Yes Historical Provider, MD  FIBER COMPLETE TABS Take 1 tablet by mouth daily.   Yes Historical Provider, MD  furosemide (LASIX) 20 MG tablet Take 20 mg by mouth daily. 11/11/15  Yes Historical Provider, MD  gabapentin (NEURONTIN) 300 MG capsule Take 1 capsule (300 mg total) by mouth at bedtime. 02/06/14  Yes Chauncey Cruel, MD  HYDROcodone-acetaminophen (NORCO/VICODIN) 5-325 MG per tablet Take 1 tablet by mouth every 6 (six) hours as needed for moderate pain. 08/17/13  Yes Minette Headland, NP  Menthol-Methyl Salicylate (ICY HOT) Q000111Q % STCK Apply 1 application topically daily as needed (for pain).   Yes Historical Provider, MD  metFORMIN (GLUCOPHAGE) 500 MG tablet Take 500 mg by mouth daily. 11/08/15  Yes Historical Provider, MD  metoprolol succinate (TOPROL-XL) 50 MG 24 hr tablet Take 50 mg by mouth 2 (two) times daily.  12/19/12  Yes Historical Provider, MD  Multiple Vitamin (MULTIVITAMIN WITH MINERALS) TABS tablet Take 1 tablet by mouth daily.    Yes Historical Provider, MD  RESTASIS 0.05 % ophthalmic emulsion Place 1 drop into both eyes 2 (two) times daily.  12/15/12  Yes Historical Provider, MD  DULoxetine (CYMBALTA) 20 MG capsule Take 1 capsule (20 mg total) by mouth daily. 08/17/13   Minette Headland, NP     Vital Signs: BP 98/71 mmHg  Pulse 99  Temp(Src) 98.2 F (36.8 C) (Oral)  Resp 49  Ht 5\' 7"  (1.702 m)  Wt 289 lb 3.9 oz (131.2 kg)  BMI 45.29 kg/m2  SpO2 94%  Physical Exam  Constitutional: She is oriented to person, place, and time.  Cardiovascular: Normal rate and regular rhythm.   Pulmonary/Chest: Effort normal and breath sounds normal.  Abdominal: Soft.  Musculoskeletal: Normal range of motion.  Neurological: She is alert and oriented to person, place, and time.  Skin: Skin is warm and dry.  Right groin NT no bleeding No hematoma Rt foot 2+ pulses   Nursing note and vitals reviewed.   Imaging: Dg Chest 2 View  11/29/2015  CLINICAL DATA:  Cough beginning Tuesday, progressive shortness of breath since yesterday, history hypertension, hyperlipidemia, uterine cancer, breast cancer EXAM: CHEST  2 VIEW COMPARISON:  01/05/2013 FINDINGS: Upper normal heart size. Atherosclerotic calcification aorta. Mediastinal contours and pulmonary vascularity normal. Respiratory motion artifacts degrade diagnostic utility of lateral view. Lungs clear. No pulmonary infiltrate, pleural effusion or pneumothorax. Endplate spur formation thoracic spine. BILATERAL glenohumeral degenerative changes with degenerative disc disease changes thoracic spine. IMPRESSION: No acute abnormalities. Aortic atherosclerosis. Electronically Signed  By: Lavonia Dana M.D.   On: 11/29/2015 14:59   Ct Angio Chest Pe W/cm &/or Wo Cm  11/29/2015  CLINICAL DATA:  Cough and progressive shortness of breath. EXAM: CT ANGIOGRAPHY CHEST WITH CONTRAST TECHNIQUE: Multidetector CT imaging of the chest was performed using the standard protocol during bolus administration  of intravenous contrast. Multiplanar CT image reconstructions and MIPs were obtained to evaluate the vascular anatomy. CONTRAST:  80 mL of Isovue 370 COMPARISON:  Chest x-ray from earlier today. CT scan of the chest February 08, 2013 FINDINGS: The central airways are within normal limits. No pneumothorax identified. Scattered subsegmental atelectasis is identified. No pulmonary nodules, masses, or focal infiltrates are identified. Pulmonary emboli are identified. There is a large embolus in the left main pulmonary artery with emboli seen in the left upper and lower lobe branches. Pulmonary emboli are also seen on the right involving right upper, middle, and lower lobe branches. Embolus extends into the periphery of the right main pulmonary artery. The aorta and main pulmonary artery are similar in caliber measuring 3.7 cm. There is no definitive flattening of the intraventricular septum or deviation to the left. The right and left ventricles are similar in size. There is no reflux of contrast into the inferior vena cava. No effusions or adenopathy. Evaluation of the upper abdomen is limited due to arterial phase imaging with no acute abnormalities seen. Visualized bones demonstrate degenerative changes in the spine but no other abnormalities. Review of the MIP images confirms the above findings. IMPRESSION: 1. There is a large burden of bilateral pulmonary emboli. Findings called to Dr. Daleen Bo Electronically Signed   By: Dorise Bullion III M.D   On: 11/29/2015 19:24   Ir Angiogram Pulmonary Bilateral Selective  12/02/2015  INDICATION: Submassive bilateral pulmonary embolism with lack of clinical improvement on IV heparin and echocardiography evidence of right heart strain. EXAM: IR INFUSION THROMBOL ARTERIAL INITIAL (MS); ADDITIONAL ARTERIOGRAPHY; BILATERAL PULMONARY ARTERIOGRAPHY; IR ULTRASOUND GUIDANCE VASC ACCESS RIGHT 1. ULTRASOUND GUIDANCE FOR VASCULAR ACCESS OF THE RIGHT COMMON FEMORAL VEIN X 2 2.  SECOND ORDER SELECTIVE LEFT-SIDED PULMONARY ARTERIOGRAPHY 3. SECOND ORDER SELECTIVE RIGHT-SIDED PULMONARY ARTERIOGRAPHY 4. THIRD ORDER RIGHT INFERIOR PULMONARY ARTERIOGRAPHY 5. THIRD ORDER RIGHT SUPERIOR PULMONARY ARTERIOGRAPHY 6. TRANSCATHETER ARTERIAL THROMBOLYSIS FOR TREATMENT OF BILATERAL PULMONARY EMBOLISM MEDICATIONS: None ANESTHESIA/SEDATION: Moderate (conscious) sedation was employed during this procedure. A total of Versed 2.0 mg and Fentanyl 100 mcg was administered intravenously. Moderate Sedation Time: 60 minutes. The patient's level of consciousness and vital signs were monitored continuously by radiology nursing throughout the procedure under my direct supervision. CONTRAST:  50 mL Isovue-300 FLUOROSCOPY TIME:  Fluoroscopy Time: 20 minutes and 24 seconds. COMPLICATIONS: None. PROCEDURE: Informed consent was obtained from the patient following explanation of the procedure, risks, benefits and alternatives. The patient understands, agrees and consents for the procedure. All questions were addressed. A time out was performed prior to the initiation of the procedure. Maximal barrier sterile technique utilized including caps, mask, sterile gowns, sterile gloves, large sterile drape, hand hygiene, and chlorhexidine prep. Ultrasound was used to confirm patency of the right common femoral vein. Under direct ultrasound guidance, 2 separate micropuncture sets were utilized in obtaining access of the right common femoral vein. At both access sites, 6 [place] vascular sheaths were placed. A 5 [place] JB 1 catheter was advanced through one of the femoral sheaths. This was used to selectively catheterize the main pulmonary outflow tract. Pulmonary artery pressure measurement was obtained in the main pulmonary artery. The  catheter was further advanced into the left pulmonary artery and selective arteriography performed. The catheter was further advanced into a lower lobe pulmonary artery branch over a hydrophilic  guidewire. The catheter was removed over a Rosen wire. A 5 French H1 catheter was advanced through the second femoral venous sheath and used to catheterize the right pulmonary artery. Arteriography was performed and pressure measurements obtained. Further selective catheterization of both lower lobes supplying and upper lobe supply was performed through the H1 catheter. Ultimately, the catheter was removed over a Rosen wire with the catheter advanced into an inferior right upper lobe branch. Over the right pulmonary artery Rosen wire, an 18 cm infusion length EKOS ultrasound assisted thrombolytic catheter was advanced. The guidewire was removed and the coaxial ultrasound infusion catheter was advanced. Over the left pulmonary artery Rosen wire, a 12 cm infusion length EKOS catheter was advanced. The guidewire was removed and the coaxial ultrasound infusion catheter advanced through the catheter. Femoral venous sheaths were secured at the right groin with Ethilon retention sutures. Overlying dressings were applied. Thrombolytic infusion of tPA was begun via the EKOS system at a dose of 1 milligram/hour via each pulmonary arterial infusion catheter. Infusion will be continued overnight for 12 hours and pulmonary artery pressures re- checked in the morning. FINDINGS: The measured pulmonary artery pressures are severely elevated. Main pulmonary artery:   63/31 (mean 44) mm Hg Left pulmonary artery:    67/53 (mean 61) mm Hg Right pulmonary artery:  68/32 (mean 47) mm Hg Selective arteriography of the left pulmonary artery demonstrates significant thrombus burden nearly occluding lower lobe flow and also with nonocclusive thrombus visualized and upper lobe pulmonary artery branches. Right-sided pulmonary arterial anatomy was difficult to catheterize due to descending angulation of the right pulmonary artery with relatively early pulmonary arterial bifurcation. Initial inferior pulmonary arterial catheterization with  selective arteriography demonstrates nonocclusive thrombus in a lower lobe pulmonary arterial branch. During the procedure the catheter retracted into upper lobe supply with selective arteriography demonstrated nonocclusive thrombus in upper lobe branches. EKOS catheters were positioned with the left sided catheter extending into a lower lobe pulmonary artery branch and the right-sided catheter lying transversely and terminating in an inferior upper lobe branch. IMPRESSION: Pulmonary arterial thrombolytic therapy begun bilaterally for significant bilateral pulmonary embolism. EKOS ultrasound assisted thrombolytic therapy was begun at a dose of 1 milligram/hour of tPA. This will be continued overnight for 12 hours of total infusion prior to rechecking pulmonary artery pressures. Arteriography demonstrates significant bilateral pulmonary arterial thrombus burden, slightly greater on the left. Measured pulmonary arterial pressures are also severely elevated. Electronically Signed   By: Aletta Edouard M.D.   On: 12/02/2015 09:12   Ir Angiogram Selective Each Additional Vessel  12/02/2015  INDICATION: Submassive bilateral pulmonary embolism with lack of clinical improvement on IV heparin and echocardiography evidence of right heart strain. EXAM: IR INFUSION THROMBOL ARTERIAL INITIAL (MS); ADDITIONAL ARTERIOGRAPHY; BILATERAL PULMONARY ARTERIOGRAPHY; IR ULTRASOUND GUIDANCE VASC ACCESS RIGHT 1. ULTRASOUND GUIDANCE FOR VASCULAR ACCESS OF THE RIGHT COMMON FEMORAL VEIN X 2 2. SECOND ORDER SELECTIVE LEFT-SIDED PULMONARY ARTERIOGRAPHY 3. SECOND ORDER SELECTIVE RIGHT-SIDED PULMONARY ARTERIOGRAPHY 4. THIRD ORDER RIGHT INFERIOR PULMONARY ARTERIOGRAPHY 5. THIRD ORDER RIGHT SUPERIOR PULMONARY ARTERIOGRAPHY 6. TRANSCATHETER ARTERIAL THROMBOLYSIS FOR TREATMENT OF BILATERAL PULMONARY EMBOLISM MEDICATIONS: None ANESTHESIA/SEDATION: Moderate (conscious) sedation was employed during this procedure. A total of Versed 2.0 mg and Fentanyl  100 mcg was administered intravenously. Moderate Sedation Time: 60 minutes. The patient's level of consciousness and vital signs were  monitored continuously by radiology nursing throughout the procedure under my direct supervision. CONTRAST:  50 mL Isovue-300 FLUOROSCOPY TIME:  Fluoroscopy Time: 20 minutes and 24 seconds. COMPLICATIONS: None. PROCEDURE: Informed consent was obtained from the patient following explanation of the procedure, risks, benefits and alternatives. The patient understands, agrees and consents for the procedure. All questions were addressed. A time out was performed prior to the initiation of the procedure. Maximal barrier sterile technique utilized including caps, mask, sterile gowns, sterile gloves, large sterile drape, hand hygiene, and chlorhexidine prep. Ultrasound was used to confirm patency of the right common femoral vein. Under direct ultrasound guidance, 2 separate micropuncture sets were utilized in obtaining access of the right common femoral vein. At both access sites, 6 [place] vascular sheaths were placed. A 5 [place] JB 1 catheter was advanced through one of the femoral sheaths. This was used to selectively catheterize the main pulmonary outflow tract. Pulmonary artery pressure measurement was obtained in the main pulmonary artery. The catheter was further advanced into the left pulmonary artery and selective arteriography performed. The catheter was further advanced into a lower lobe pulmonary artery branch over a hydrophilic guidewire. The catheter was removed over a Rosen wire. A 5 French H1 catheter was advanced through the second femoral venous sheath and used to catheterize the right pulmonary artery. Arteriography was performed and pressure measurements obtained. Further selective catheterization of both lower lobes supplying and upper lobe supply was performed through the H1 catheter. Ultimately, the catheter was removed over a Rosen wire with the catheter advanced into  an inferior right upper lobe branch. Over the right pulmonary artery Rosen wire, an 18 cm infusion length EKOS ultrasound assisted thrombolytic catheter was advanced. The guidewire was removed and the coaxial ultrasound infusion catheter was advanced. Over the left pulmonary artery Rosen wire, a 12 cm infusion length EKOS catheter was advanced. The guidewire was removed and the coaxial ultrasound infusion catheter advanced through the catheter. Femoral venous sheaths were secured at the right groin with Ethilon retention sutures. Overlying dressings were applied. Thrombolytic infusion of tPA was begun via the EKOS system at a dose of 1 milligram/hour via each pulmonary arterial infusion catheter. Infusion will be continued overnight for 12 hours and pulmonary artery pressures re- checked in the morning. FINDINGS: The measured pulmonary artery pressures are severely elevated. Main pulmonary artery:   63/31 (mean 44) mm Hg Left pulmonary artery:    67/53 (mean 61) mm Hg Right pulmonary artery:  68/32 (mean 47) mm Hg Selective arteriography of the left pulmonary artery demonstrates significant thrombus burden nearly occluding lower lobe flow and also with nonocclusive thrombus visualized and upper lobe pulmonary artery branches. Right-sided pulmonary arterial anatomy was difficult to catheterize due to descending angulation of the right pulmonary artery with relatively early pulmonary arterial bifurcation. Initial inferior pulmonary arterial catheterization with selective arteriography demonstrates nonocclusive thrombus in a lower lobe pulmonary arterial branch. During the procedure the catheter retracted into upper lobe supply with selective arteriography demonstrated nonocclusive thrombus in upper lobe branches. EKOS catheters were positioned with the left sided catheter extending into a lower lobe pulmonary artery branch and the right-sided catheter lying transversely and terminating in an inferior upper lobe  branch. IMPRESSION: Pulmonary arterial thrombolytic therapy begun bilaterally for significant bilateral pulmonary embolism. EKOS ultrasound assisted thrombolytic therapy was begun at a dose of 1 milligram/hour of tPA. This will be continued overnight for 12 hours of total infusion prior to rechecking pulmonary artery pressures. Arteriography demonstrates significant bilateral pulmonary arterial  thrombus burden, slightly greater on the left. Measured pulmonary arterial pressures are also severely elevated. Electronically Signed   By: Aletta Edouard M.D.   On: 12/02/2015 09:12   Ir Angiogram Selective Each Additional Vessel  12/02/2015  INDICATION: Submassive bilateral pulmonary embolism with lack of clinical improvement on IV heparin and echocardiography evidence of right heart strain. EXAM: IR INFUSION THROMBOL ARTERIAL INITIAL (MS); ADDITIONAL ARTERIOGRAPHY; BILATERAL PULMONARY ARTERIOGRAPHY; IR ULTRASOUND GUIDANCE VASC ACCESS RIGHT 1. ULTRASOUND GUIDANCE FOR VASCULAR ACCESS OF THE RIGHT COMMON FEMORAL VEIN X 2 2. SECOND ORDER SELECTIVE LEFT-SIDED PULMONARY ARTERIOGRAPHY 3. SECOND ORDER SELECTIVE RIGHT-SIDED PULMONARY ARTERIOGRAPHY 4. THIRD ORDER RIGHT INFERIOR PULMONARY ARTERIOGRAPHY 5. THIRD ORDER RIGHT SUPERIOR PULMONARY ARTERIOGRAPHY 6. TRANSCATHETER ARTERIAL THROMBOLYSIS FOR TREATMENT OF BILATERAL PULMONARY EMBOLISM MEDICATIONS: None ANESTHESIA/SEDATION: Moderate (conscious) sedation was employed during this procedure. A total of Versed 2.0 mg and Fentanyl 100 mcg was administered intravenously. Moderate Sedation Time: 60 minutes. The patient's level of consciousness and vital signs were monitored continuously by radiology nursing throughout the procedure under my direct supervision. CONTRAST:  50 mL Isovue-300 FLUOROSCOPY TIME:  Fluoroscopy Time: 20 minutes and 24 seconds. COMPLICATIONS: None. PROCEDURE: Informed consent was obtained from the patient following explanation of the procedure, risks, benefits  and alternatives. The patient understands, agrees and consents for the procedure. All questions were addressed. A time out was performed prior to the initiation of the procedure. Maximal barrier sterile technique utilized including caps, mask, sterile gowns, sterile gloves, large sterile drape, hand hygiene, and chlorhexidine prep. Ultrasound was used to confirm patency of the right common femoral vein. Under direct ultrasound guidance, 2 separate micropuncture sets were utilized in obtaining access of the right common femoral vein. At both access sites, 6 [place] vascular sheaths were placed. A 5 [place] JB 1 catheter was advanced through one of the femoral sheaths. This was used to selectively catheterize the main pulmonary outflow tract. Pulmonary artery pressure measurement was obtained in the main pulmonary artery. The catheter was further advanced into the left pulmonary artery and selective arteriography performed. The catheter was further advanced into a lower lobe pulmonary artery branch over a hydrophilic guidewire. The catheter was removed over a Rosen wire. A 5 French H1 catheter was advanced through the second femoral venous sheath and used to catheterize the right pulmonary artery. Arteriography was performed and pressure measurements obtained. Further selective catheterization of both lower lobes supplying and upper lobe supply was performed through the H1 catheter. Ultimately, the catheter was removed over a Rosen wire with the catheter advanced into an inferior right upper lobe branch. Over the right pulmonary artery Rosen wire, an 18 cm infusion length EKOS ultrasound assisted thrombolytic catheter was advanced. The guidewire was removed and the coaxial ultrasound infusion catheter was advanced. Over the left pulmonary artery Rosen wire, a 12 cm infusion length EKOS catheter was advanced. The guidewire was removed and the coaxial ultrasound infusion catheter advanced through the catheter. Femoral  venous sheaths were secured at the right groin with Ethilon retention sutures. Overlying dressings were applied. Thrombolytic infusion of tPA was begun via the EKOS system at a dose of 1 milligram/hour via each pulmonary arterial infusion catheter. Infusion will be continued overnight for 12 hours and pulmonary artery pressures re- checked in the morning. FINDINGS: The measured pulmonary artery pressures are severely elevated. Main pulmonary artery:   63/31 (mean 44) mm Hg Left pulmonary artery:    67/53 (mean 61) mm Hg Right pulmonary artery:  68/32 (mean 47) mm Hg Selective arteriography  of the left pulmonary artery demonstrates significant thrombus burden nearly occluding lower lobe flow and also with nonocclusive thrombus visualized and upper lobe pulmonary artery branches. Right-sided pulmonary arterial anatomy was difficult to catheterize due to descending angulation of the right pulmonary artery with relatively early pulmonary arterial bifurcation. Initial inferior pulmonary arterial catheterization with selective arteriography demonstrates nonocclusive thrombus in a lower lobe pulmonary arterial branch. During the procedure the catheter retracted into upper lobe supply with selective arteriography demonstrated nonocclusive thrombus in upper lobe branches. EKOS catheters were positioned with the left sided catheter extending into a lower lobe pulmonary artery branch and the right-sided catheter lying transversely and terminating in an inferior upper lobe branch. IMPRESSION: Pulmonary arterial thrombolytic therapy begun bilaterally for significant bilateral pulmonary embolism. EKOS ultrasound assisted thrombolytic therapy was begun at a dose of 1 milligram/hour of tPA. This will be continued overnight for 12 hours of total infusion prior to rechecking pulmonary artery pressures. Arteriography demonstrates significant bilateral pulmonary arterial thrombus burden, slightly greater on the left. Measured pulmonary  arterial pressures are also severely elevated. Electronically Signed   By: Aletta Edouard M.D.   On: 12/02/2015 09:12   Ir Angiogram Selective Each Additional Vessel  12/02/2015  INDICATION: Submassive bilateral pulmonary embolism with lack of clinical improvement on IV heparin and echocardiography evidence of right heart strain. EXAM: IR INFUSION THROMBOL ARTERIAL INITIAL (MS); ADDITIONAL ARTERIOGRAPHY; BILATERAL PULMONARY ARTERIOGRAPHY; IR ULTRASOUND GUIDANCE VASC ACCESS RIGHT 1. ULTRASOUND GUIDANCE FOR VASCULAR ACCESS OF THE RIGHT COMMON FEMORAL VEIN X 2 2. SECOND ORDER SELECTIVE LEFT-SIDED PULMONARY ARTERIOGRAPHY 3. SECOND ORDER SELECTIVE RIGHT-SIDED PULMONARY ARTERIOGRAPHY 4. THIRD ORDER RIGHT INFERIOR PULMONARY ARTERIOGRAPHY 5. THIRD ORDER RIGHT SUPERIOR PULMONARY ARTERIOGRAPHY 6. TRANSCATHETER ARTERIAL THROMBOLYSIS FOR TREATMENT OF BILATERAL PULMONARY EMBOLISM MEDICATIONS: None ANESTHESIA/SEDATION: Moderate (conscious) sedation was employed during this procedure. A total of Versed 2.0 mg and Fentanyl 100 mcg was administered intravenously. Moderate Sedation Time: 60 minutes. The patient's level of consciousness and vital signs were monitored continuously by radiology nursing throughout the procedure under my direct supervision. CONTRAST:  50 mL Isovue-300 FLUOROSCOPY TIME:  Fluoroscopy Time: 20 minutes and 24 seconds. COMPLICATIONS: None. PROCEDURE: Informed consent was obtained from the patient following explanation of the procedure, risks, benefits and alternatives. The patient understands, agrees and consents for the procedure. All questions were addressed. A time out was performed prior to the initiation of the procedure. Maximal barrier sterile technique utilized including caps, mask, sterile gowns, sterile gloves, large sterile drape, hand hygiene, and chlorhexidine prep. Ultrasound was used to confirm patency of the right common femoral vein. Under direct ultrasound guidance, 2 separate  micropuncture sets were utilized in obtaining access of the right common femoral vein. At both access sites, 6 [place] vascular sheaths were placed. A 5 [place] JB 1 catheter was advanced through one of the femoral sheaths. This was used to selectively catheterize the main pulmonary outflow tract. Pulmonary artery pressure measurement was obtained in the main pulmonary artery. The catheter was further advanced into the left pulmonary artery and selective arteriography performed. The catheter was further advanced into a lower lobe pulmonary artery branch over a hydrophilic guidewire. The catheter was removed over a Rosen wire. A 5 French H1 catheter was advanced through the second femoral venous sheath and used to catheterize the right pulmonary artery. Arteriography was performed and pressure measurements obtained. Further selective catheterization of both lower lobes supplying and upper lobe supply was performed through the H1 catheter. Ultimately, the catheter was removed over a Rosen wire with  the catheter advanced into an inferior right upper lobe branch. Over the right pulmonary artery Rosen wire, an 18 cm infusion length EKOS ultrasound assisted thrombolytic catheter was advanced. The guidewire was removed and the coaxial ultrasound infusion catheter was advanced. Over the left pulmonary artery Rosen wire, a 12 cm infusion length EKOS catheter was advanced. The guidewire was removed and the coaxial ultrasound infusion catheter advanced through the catheter. Femoral venous sheaths were secured at the right groin with Ethilon retention sutures. Overlying dressings were applied. Thrombolytic infusion of tPA was begun via the EKOS system at a dose of 1 milligram/hour via each pulmonary arterial infusion catheter. Infusion will be continued overnight for 12 hours and pulmonary artery pressures re- checked in the morning. FINDINGS: The measured pulmonary artery pressures are severely elevated. Main pulmonary artery:    63/31 (mean 44) mm Hg Left pulmonary artery:    67/53 (mean 61) mm Hg Right pulmonary artery:  68/32 (mean 47) mm Hg Selective arteriography of the left pulmonary artery demonstrates significant thrombus burden nearly occluding lower lobe flow and also with nonocclusive thrombus visualized and upper lobe pulmonary artery branches. Right-sided pulmonary arterial anatomy was difficult to catheterize due to descending angulation of the right pulmonary artery with relatively early pulmonary arterial bifurcation. Initial inferior pulmonary arterial catheterization with selective arteriography demonstrates nonocclusive thrombus in a lower lobe pulmonary arterial branch. During the procedure the catheter retracted into upper lobe supply with selective arteriography demonstrated nonocclusive thrombus in upper lobe branches. EKOS catheters were positioned with the left sided catheter extending into a lower lobe pulmonary artery branch and the right-sided catheter lying transversely and terminating in an inferior upper lobe branch. IMPRESSION: Pulmonary arterial thrombolytic therapy begun bilaterally for significant bilateral pulmonary embolism. EKOS ultrasound assisted thrombolytic therapy was begun at a dose of 1 milligram/hour of tPA. This will be continued overnight for 12 hours of total infusion prior to rechecking pulmonary artery pressures. Arteriography demonstrates significant bilateral pulmonary arterial thrombus burden, slightly greater on the left. Measured pulmonary arterial pressures are also severely elevated. Electronically Signed   By: Aletta Edouard M.D.   On: 12/02/2015 09:12   Ir US Guide Vasc Access Right  12/02/2015  INDICATION: Submassive bilateral pulmonary embolism with lack of clinical improvement on IV heparin and echocardiography evidence of right heart strain. EXAM: IR INFUSION THROMBOL ARTERIAL INITIAL (MS); ADDITIONAL ARTERIOGRAPHY; BILATERAL PULMONARY ARTERIOGRAPHY; IR ULTRASOUND GUIDANCE  VASC ACCESS RIGHT 1. ULTRASOUND GUIDANCE FOR VASCULAR ACCESS OF THE RIGHT COMMON FEMORAL VEIN X 2 2. SECOND ORDER SELECTIVE LEFT-SIDED PULMONARY ARTERIOGRAPHY 3. SECOND ORDER SELECTIVE RIGHT-SIDED PULMONARY ARTERIOGRAPHY 4. THIRD ORDER RIGHT INFERIOR PULMONARY ARTERIOGRAPHY 5. THIRD ORDER RIGHT SUPERIOR PULMONARY ARTERIOGRAPHY 6. TRANSCATHETER ARTERIAL THROMBOLYSIS FOR TREATMENT OF BILATERAL PULMONARY EMBOLISM MEDICATIONS: None ANESTHESIA/SEDATION: Moderate (conscious) sedation was employed during this procedure. A total of Versed 2.0 mg and Fentanyl 100 mcg was administered intravenously. Moderate Sedation Time: 60 minutes. The patient's level of consciousness and vital signs were monitored continuously by radiology nursing throughout the procedure under my direct supervision. CONTRAST:  50 mL Isovue-300 FLUOROSCOPY TIME:  Fluoroscopy Time: 20 minutes and 24 seconds. COMPLICATIONS: None. PROCEDURE: Informed consent was obtained from the patient following explanation of the procedure, risks, benefits and alternatives. The patient understands, agrees and consents for the procedure. All questions were addressed. A time out was performed prior to the initiation of the procedure. Maximal barrier sterile technique utilized including caps, mask, sterile gowns, sterile gloves, large sterile drape, hand hygiene, and chlorhexidine prep. Ultrasound was used  to confirm patency of the right common femoral vein. Under direct ultrasound guidance, 2 separate micropuncture sets were utilized in obtaining access of the right common femoral vein. At both access sites, 6 [place] vascular sheaths were placed. A 5 [place] JB 1 catheter was advanced through one of the femoral sheaths. This was used to selectively catheterize the main pulmonary outflow tract. Pulmonary artery pressure measurement was obtained in the main pulmonary artery. The catheter was further advanced into the left pulmonary artery and selective arteriography performed.  The catheter was further advanced into a lower lobe pulmonary artery branch over a hydrophilic guidewire. The catheter was removed over a Rosen wire. A 5 French H1 catheter was advanced through the second femoral venous sheath and used to catheterize the right pulmonary artery. Arteriography was performed and pressure measurements obtained. Further selective catheterization of both lower lobes supplying and upper lobe supply was performed through the H1 catheter. Ultimately, the catheter was removed over a Rosen wire with the catheter advanced into an inferior right upper lobe branch. Over the right pulmonary artery Rosen wire, an 18 cm infusion length EKOS ultrasound assisted thrombolytic catheter was advanced. The guidewire was removed and the coaxial ultrasound infusion catheter was advanced. Over the left pulmonary artery Rosen wire, a 12 cm infusion length EKOS catheter was advanced. The guidewire was removed and the coaxial ultrasound infusion catheter advanced through the catheter. Femoral venous sheaths were secured at the right groin with Ethilon retention sutures. Overlying dressings were applied. Thrombolytic infusion of tPA was begun via the EKOS system at a dose of 1 milligram/hour via each pulmonary arterial infusion catheter. Infusion will be continued overnight for 12 hours and pulmonary artery pressures re- checked in the morning. FINDINGS: The measured pulmonary artery pressures are severely elevated. Main pulmonary artery:   63/31 (mean 44) mm Hg Left pulmonary artery:    67/53 (mean 61) mm Hg Right pulmonary artery:  68/32 (mean 47) mm Hg Selective arteriography of the left pulmonary artery demonstrates significant thrombus burden nearly occluding lower lobe flow and also with nonocclusive thrombus visualized and upper lobe pulmonary artery branches. Right-sided pulmonary arterial anatomy was difficult to catheterize due to descending angulation of the right pulmonary artery with relatively early  pulmonary arterial bifurcation. Initial inferior pulmonary arterial catheterization with selective arteriography demonstrates nonocclusive thrombus in a lower lobe pulmonary arterial branch. During the procedure the catheter retracted into upper lobe supply with selective arteriography demonstrated nonocclusive thrombus in upper lobe branches. EKOS catheters were positioned with the left sided catheter extending into a lower lobe pulmonary artery branch and the right-sided catheter lying transversely and terminating in an inferior upper lobe branch. IMPRESSION: Pulmonary arterial thrombolytic therapy begun bilaterally for significant bilateral pulmonary embolism. EKOS ultrasound assisted thrombolytic therapy was begun at a dose of 1 milligram/hour of tPA. This will be continued overnight for 12 hours of total infusion prior to rechecking pulmonary artery pressures. Arteriography demonstrates significant bilateral pulmonary arterial thrombus burden, slightly greater on the left. Measured pulmonary arterial pressures are also severely elevated. Electronically Signed   By: Aletta Edouard M.D.   On: 12/02/2015 09:12   Dg Chest Port 1 View  12/02/2015  CLINICAL DATA:  74 year old female, pulmonary embolism with thrombolysis. EXAM: PORTABLE CHEST 1 VIEW COMPARISON:  11/29/2015, CT 11/29/2015 FINDINGS: Cardiomediastinal silhouette unchanged in size and contour. Calcifications of the aortic arch. Bilateral pulmonary arterial thrombolysis catheters in place, unchanged from the most recent comparison angiogram. Low lung volumes with basilar opacity. No pneumothorax or  pleural effusion. IMPRESSION: Low lung volumes with basilar opacity/consolidation. Unchanged position of bilateral pulmonary arterial thrombolysis catheters. Aortic atherosclerosis Signed, Dulcy Fanny. Earleen Newport, DO [facility] Electronically Signed   By: Corrie Mckusick D.O.   On: 12/02/2015 10:32   Ir  Infusion Thrombol Arterial Initial (ms)  12/02/2015  INDICATION: Submassive bilateral pulmonary embolism with lack of clinical improvement on IV heparin and echocardiography evidence of right heart strain. EXAM: IR INFUSION THROMBOL ARTERIAL INITIAL (MS); ADDITIONAL ARTERIOGRAPHY; BILATERAL PULMONARY ARTERIOGRAPHY; IR ULTRASOUND GUIDANCE VASC ACCESS RIGHT 1. ULTRASOUND GUIDANCE FOR VASCULAR ACCESS OF THE RIGHT COMMON FEMORAL VEIN X 2 2. SECOND ORDER SELECTIVE LEFT-SIDED PULMONARY ARTERIOGRAPHY 3. SECOND ORDER SELECTIVE RIGHT-SIDED PULMONARY ARTERIOGRAPHY 4. THIRD ORDER RIGHT INFERIOR PULMONARY ARTERIOGRAPHY 5. THIRD ORDER RIGHT SUPERIOR PULMONARY ARTERIOGRAPHY 6. TRANSCATHETER ARTERIAL THROMBOLYSIS FOR TREATMENT OF BILATERAL PULMONARY EMBOLISM MEDICATIONS: None ANESTHESIA/SEDATION: Moderate (conscious) sedation was employed during this procedure. A total of Versed 2.0 mg and Fentanyl 100 mcg was administered intravenously. Moderate Sedation Time: 60 minutes. The patient's level of consciousness and vital signs were monitored continuously by radiology nursing throughout the procedure under my direct supervision. CONTRAST:  50 mL Isovue-300 FLUOROSCOPY TIME:  Fluoroscopy Time: 20 minutes and 24 seconds. COMPLICATIONS: None. PROCEDURE: Informed consent was obtained from the patient following explanation of the procedure, risks, benefits and alternatives. The patient understands, agrees and consents for the procedure. All questions were addressed. A time out was performed prior to the initiation of the procedure. Maximal barrier sterile technique utilized including caps, mask, sterile gowns, sterile gloves, large sterile drape, hand hygiene, and chlorhexidine prep. Ultrasound was used to confirm patency of the right common femoral vein. Under direct ultrasound guidance, 2 separate micropuncture sets were utilized in obtaining access of the right common femoral vein. At both access sites, 6 [place] vascular sheaths were  placed. A 5 [place] JB 1 catheter was advanced through one of the femoral sheaths. This was used to selectively catheterize the main pulmonary outflow tract. Pulmonary artery pressure measurement was obtained in the main pulmonary artery. The catheter was further advanced into the left pulmonary artery and selective arteriography performed. The catheter was further advanced into a lower lobe pulmonary artery branch over a hydrophilic guidewire. The catheter was removed over a Rosen wire. A 5 French H1 catheter was advanced through the second femoral venous sheath and used to catheterize the right pulmonary artery. Arteriography was performed and pressure measurements obtained. Further selective catheterization of both lower lobes supplying and upper lobe supply was performed through the H1 catheter. Ultimately, the catheter was removed over a Rosen wire with the catheter advanced into an inferior right upper lobe branch. Over the right pulmonary artery Rosen wire, an 18 cm infusion length EKOS ultrasound assisted thrombolytic catheter was advanced. The guidewire was removed and the coaxial ultrasound infusion catheter was advanced. Over the left pulmonary artery Rosen wire, a 12 cm infusion length EKOS catheter was advanced. The guidewire was removed and the coaxial ultrasound infusion catheter advanced through the catheter. Femoral venous sheaths were secured at the right groin with Ethilon retention sutures. Overlying dressings were applied. Thrombolytic infusion of tPA was begun via the EKOS system at a dose of 1 milligram/hour via each pulmonary arterial infusion catheter. Infusion will be continued overnight for 12 hours and pulmonary artery pressures re- checked in the morning. FINDINGS: The measured pulmonary artery pressures are severely elevated. Main pulmonary artery:   63/31 (mean 44) mm Hg Left pulmonary artery:  67/53 (mean 61) mm Hg Right pulmonary artery:  68/32 (mean 47) mm Hg Selective  arteriography of the left pulmonary artery demonstrates significant thrombus burden nearly occluding lower lobe flow and also with nonocclusive thrombus visualized and upper lobe pulmonary artery branches. Right-sided pulmonary arterial anatomy was difficult to catheterize due to descending angulation of the right pulmonary artery with relatively early pulmonary arterial bifurcation. Initial inferior pulmonary arterial catheterization with selective arteriography demonstrates nonocclusive thrombus in a lower lobe pulmonary arterial branch. During the procedure the catheter retracted into upper lobe supply with selective arteriography demonstrated nonocclusive thrombus in upper lobe branches. EKOS catheters were positioned with the left sided catheter extending into a lower lobe pulmonary artery branch and the right-sided catheter lying transversely and terminating in an inferior upper lobe branch. IMPRESSION: Pulmonary arterial thrombolytic therapy begun bilaterally for significant bilateral pulmonary embolism. EKOS ultrasound assisted thrombolytic therapy was begun at a dose of 1 milligram/hour of tPA. This will be continued overnight for 12 hours of total infusion prior to rechecking pulmonary artery pressures. Arteriography demonstrates significant bilateral pulmonary arterial thrombus burden, slightly greater on the left. Measured pulmonary arterial pressures are also severely elevated. Electronically Signed   By: Aletta Edouard M.D.   On: 12/02/2015 09:12   Ir Infusion Thrombol Arterial Initial (ms)  12/02/2015  INDICATION: Submassive bilateral pulmonary embolism with lack of clinical improvement on IV heparin and echocardiography evidence of right heart strain. EXAM: IR INFUSION THROMBOL ARTERIAL INITIAL (MS); ADDITIONAL ARTERIOGRAPHY; BILATERAL PULMONARY ARTERIOGRAPHY; IR ULTRASOUND GUIDANCE VASC ACCESS RIGHT 1. ULTRASOUND GUIDANCE FOR VASCULAR ACCESS OF THE RIGHT COMMON FEMORAL VEIN X 2 2. SECOND ORDER  SELECTIVE LEFT-SIDED PULMONARY ARTERIOGRAPHY 3. SECOND ORDER SELECTIVE RIGHT-SIDED PULMONARY ARTERIOGRAPHY 4. THIRD ORDER RIGHT INFERIOR PULMONARY ARTERIOGRAPHY 5. THIRD ORDER RIGHT SUPERIOR PULMONARY ARTERIOGRAPHY 6. TRANSCATHETER ARTERIAL THROMBOLYSIS FOR TREATMENT OF BILATERAL PULMONARY EMBOLISM MEDICATIONS: None ANESTHESIA/SEDATION: Moderate (conscious) sedation was employed during this procedure. A total of Versed 2.0 mg and Fentanyl 100 mcg was administered intravenously. Moderate Sedation Time: 60 minutes. The patient's level of consciousness and vital signs were monitored continuously by radiology nursing throughout the procedure under my direct supervision. CONTRAST:  50 mL Isovue-300 FLUOROSCOPY TIME:  Fluoroscopy Time: 20 minutes and 24 seconds. COMPLICATIONS: None. PROCEDURE: Informed consent was obtained from the patient following explanation of the procedure, risks, benefits and alternatives. The patient understands, agrees and consents for the procedure. All questions were addressed. A time out was performed prior to the initiation of the procedure. Maximal barrier sterile technique utilized including caps, mask, sterile gowns, sterile gloves, large sterile drape, hand hygiene, and chlorhexidine prep. Ultrasound was used to confirm patency of the right common femoral vein. Under direct ultrasound guidance, 2 separate micropuncture sets were utilized in obtaining access of the right common femoral vein. At both access sites, 6 [place] vascular sheaths were placed. A 5 [place] JB 1 catheter was advanced through one of the femoral sheaths. This was used to selectively catheterize the main pulmonary outflow tract. Pulmonary artery pressure measurement was obtained in the main pulmonary artery. The catheter was further advanced into the left pulmonary artery and selective arteriography performed. The catheter was further advanced into a lower lobe pulmonary artery branch over a hydrophilic guidewire. The  catheter was removed over a Rosen wire. A 5 French H1 catheter was advanced through the second femoral venous sheath and used to catheterize the right pulmonary artery. Arteriography was performed and pressure measurements obtained. Further selective catheterization of both lower lobes supplying and upper lobe  supply was performed through the H1 catheter. Ultimately, the catheter was removed over a Rosen wire with the catheter advanced into an inferior right upper lobe branch. Over the right pulmonary artery Rosen wire, an 18 cm infusion length EKOS ultrasound assisted thrombolytic catheter was advanced. The guidewire was removed and the coaxial ultrasound infusion catheter was advanced. Over the left pulmonary artery Rosen wire, a 12 cm infusion length EKOS catheter was advanced. The guidewire was removed and the coaxial ultrasound infusion catheter advanced through the catheter. Femoral venous sheaths were secured at the right groin with Ethilon retention sutures. Overlying dressings were applied. Thrombolytic infusion of tPA was begun via the EKOS system at a dose of 1 milligram/hour via each pulmonary arterial infusion catheter. Infusion will be continued overnight for 12 hours and pulmonary artery pressures re- checked in the morning. FINDINGS: The measured pulmonary artery pressures are severely elevated. Main pulmonary artery:   63/31 (mean 44) mm Hg Left pulmonary artery:    67/53 (mean 61) mm Hg Right pulmonary artery:  68/32 (mean 47) mm Hg Selective arteriography of the left pulmonary artery demonstrates significant thrombus burden nearly occluding lower lobe flow and also with nonocclusive thrombus visualized and upper lobe pulmonary artery branches. Right-sided pulmonary arterial anatomy was difficult to catheterize due to descending angulation of the right pulmonary artery with relatively early pulmonary arterial bifurcation. Initial inferior pulmonary arterial catheterization with selective  arteriography demonstrates nonocclusive thrombus in a lower lobe pulmonary arterial branch. During the procedure the catheter retracted into upper lobe supply with selective arteriography demonstrated nonocclusive thrombus in upper lobe branches. EKOS catheters were positioned with the left sided catheter extending into a lower lobe pulmonary artery branch and the right-sided catheter lying transversely and terminating in an inferior upper lobe branch. IMPRESSION: Pulmonary arterial thrombolytic therapy begun bilaterally for significant bilateral pulmonary embolism. EKOS ultrasound assisted thrombolytic therapy was begun at a dose of 1 milligram/hour of tPA. This will be continued overnight for 12 hours of total infusion prior to rechecking pulmonary artery pressures. Arteriography demonstrates significant bilateral pulmonary arterial thrombus burden, slightly greater on the left. Measured pulmonary arterial pressures are also severely elevated. Electronically Signed   By: Aletta Edouard M.D.   On: 12/02/2015 09:12   Ir Jacolyn Reedy F/u Eval Art/ven Final Day (ms)  12/02/2015  INDICATION: History of sub massive pulmonary embolism, post initiation of bilateral pulmonary arterial thrombolysis on 12/01/2015. Patient has completed 12 hours of bilateral pulmonary arterial lytic infusion and returns today to the interventional radiology suite for repeat pulmonary arterial pressure measurements. Patient reports subjective improvement in her preprocedural shortness of breath. She reports a minimal amount of losing about her nasal cannula insertion site as well as the site of a right upper extremity IV. She is otherwise without complaint. EXAM: PULMONARY ARTERIAL PRESSURE MEASUREMENT AND PULMONARY INFUSION CATHETER REMOVAL COMPARISON:  Initiation of bilateral pulmonary arterial catheter directed thrombolysis - 12/01/2015 ; chest CT - 11/29/2015; chest radiograph - earlier same day MEDICATIONS: None CONTRAST:  None FLUOROSCOPY  TIME:  None COMPLICATIONS: None immediate. TECHNIQUE: Chest radiograph performed earlier this morning demonstrated unchanged positioning of the bilateral deep dose infusion catheters. Both pulmonary arterial catheter infusion wires were removed and the multi side-hole infusion catheter was retracted to the level of the main pulmonary artery. Pressure measurements were acquired from this location. At this point, the procedure was terminated. All wires, catheters and sheaths were removed from the patient. Hemostasis was achieved at the right groin access site with manual compression. A  dressing was placed. The patient tolerated the procedure well without immediate postprocedural complication. FINDINGS: Acquired pressure measurements as follows: Preprocedural main pulmonary artery - 63/31; mean - 44 (normal: < 25/10) Postprocedural main pulmonary artery - 64/30; mean - 44 IMPRESSION: No significant reduction and mean main pulmonary arterial pressure measurement following 12 hours of bilateral ultrasound assisted pulmonary arterial lysis. PLAN: - recommend continuing IV heparin until transitioned to long-term outpatient anticoagulant as per providing clinical team. - would suggest obtaining bilateral lower extremity venous Doppler ultrasounds and consideration of IVC filter placement for temporary caval interruption as clinically indicated. Electronically Signed   By: Sandi Mariscal M.D.   On: 12/02/2015 11:26    Labs:  CBC:  Recent Labs  12/02/15 0340 12/02/15 0945 12/02/15 1600 12/03/15 0331  WBC 8.0 7.3 7.6 7.3  HGB 12.6 12.2 12.1 11.8*  HCT 40.9 40.3 38.9 37.4  PLT 124* 123* 123* 117*    COAGS: No results for input(s): INR, APTT in the last 8760 hours.  BMP:  Recent Labs  11/29/15 1434 11/30/15 0205 12/01/15 1534 12/02/15 0340  NA 138 136 139 139  K 4.0 4.0 4.4 4.3  CL 107 108 108 108  CO2 21* 19* 23 23  GLUCOSE 156* 199* 132* 127*  BUN 19 18 13 12   CALCIUM 9.6 9.2 9.3 9.3    CREATININE 1.46* 1.55* 1.33* 1.24*  GFRNONAA 34* 32* 39* 42*  GFRAA 40* 37* 45* 49*    LIVER FUNCTION TESTS:  Recent Labs  09/24/15 1400  BILITOT 0.45  AST 13  ALT 21  ALKPHOS 90  PROT 7.4  ALBUMIN 3.6    Assessment and Plan:  Saddle PE Ekos thrombolysis 7/10-11 Better daily Plan per CCM  Electronically Signed: Lawarence Meek A 12/03/2015, 11:43 AM   I spent a total of 15 Minutes at the the patient's bedside AND on the patient's hospital floor or unit, greater than 50% of which was counseling/coordinating care for PE lysis

## 2015-12-03 NOTE — Progress Notes (Signed)
PULMONARY / CRITICAL CARE MEDICINE   Name: [name] MRN: [id number] DOB: 07-27-1940    ADMISSION DATE:  11/29/2015 CONSULTATION DATE:  12/01/15  REFERRING MD:  Dr. Coralyn [facility])  CHIEF COMPLAINT:  New PE  HISTORY OF PRESENT ILLNESS:   [name] is a 75 y.o. female with medical history significant of HTN, HLD, breast cancer s/p lumpectomy/radiation, hysterectomy/BSO for endometrial cancer, and arthritis; who presents with complaints of shortness of breath. Symptoms started approximately 11/25/15 with dry cough and progressed to shortness of breath. On admission to the hospital a CTA was performed and revealed bilateral pulmonary emboli involving the left upper and lower lobe branches and the right upper, middle and lower lobe branches. ECHO reveals mildly dilated right ventricle with moderately reduced systolic function. PCCM is consulted for shortness and breath and consideration for EKOS.  PAST MEDICAL HISTORY :  She  has a past medical history of Hyperlipidemia; Hypertension; Cold; Complication of anesthesia; Exertional shortness of breath; Uterine cancer ([place]); Chronic lower back pain; Depression; Arthritis; Breast cancer ([place]); Cancer ([place]); Allergy; Osteoporosis; and Status post radiation therapy within last four weeks (03/19/13-05/03/13).  PAST SURGICAL HISTORY: She  has past surgical history that includes Replacement total knee (Bilateral, 2011-2012); Foot Tendon Surgery (Right); Breast lumpectomy with needle localization and axillary sentinel lymph node bx (Left, 01/11/2013); Breast biopsy (Left, 12/2012); Abdominal hysterectomy (12/14/2012); Dilation and curettage of uterus; Tubal ligation; Cholecystectomy (1970); Knee arthroscopy (Right, 1990's); and Breast lumpectomy with needle localization and axillary sentinel lymph node bx (Left, 01/11/2013).  Allergies  Allergen Reactions  . Iodine Rash    No current facility-administered medications on file prior to encounter.    Current Outpatient Prescriptions on File Prior to Encounter  Medication Sig  . carisoprodol (SOMA) 350 MG tablet Take 350 mg by mouth every 6 (six) hours.   . Diclofenac Sodium (PENNSAID) 1.5 % SOLN Place 10 drops onto the skin 4 (four) times daily. For pain  . exemestane (AROMASIN) 25 MG tablet Take 1 tablet (25 mg total) by mouth daily.  Marland Kitchen gabapentin (NEURONTIN) 300 MG capsule Take 1 capsule (300 mg total) by mouth at bedtime.  Marland Kitchen HYDROcodone-acetaminophen (NORCO/VICODIN) 5-325 MG per tablet Take 1 tablet by mouth every 6 (six) hours as needed for moderate pain.  . metoprolol succinate (TOPROL-XL) 50 MG 24 hr tablet Take 50 mg by mouth 2 (two) times daily.   . Multiple Vitamin (MULTIVITAMIN WITH MINERALS) TABS tablet Take 1 tablet by mouth daily.  . RESTASIS 0.05 % ophthalmic emulsion Place 1 drop into both eyes 2 (two) times daily.   . DULoxetine (CYMBALTA) 20 MG capsule Take 1 capsule (20 mg total) by mouth daily.    FAMILY HISTORY:  Her has no family status information on file.   SOCIAL HISTORY: She  reports that she has never smoked. She has never used smokeless tobacco. She reports that she does not drink alcohol or use illicit drugs.  REVIEW OF SYSTEMS:   Positive for fatigue, shortness of breath, mild chest pain, body aches Negative for headaches, changes in vision.  SUBJECTIVE:  Pt states she is doing well. She is tired of getting lab draws. Her nose has stopped bleeding. She feels like her breathing is improving. She denies chest pain.  VITAL SIGNS: BP 102/78 mmHg  Pulse 95  Temp(Src) 98.6 F (37 C) (Other (Comment))  Resp 23  Ht 5\' 7"  (1.702 m)  Wt 131.2 kg (289 lb 3.9 oz)  BMI 45.29 kg/m2  SpO2 91%  HEMODYNAMICS:    VENTILATOR SETTINGS:    INTAKE / OUTPUT: I/O last 3 completed shifts: In: 2358 [I.V.:2358] Out: -   PHYSICAL EXAMINATION: General: Morbidly obese elderly female, lying awake in bed, pleasant and conversational Neuro: Alert and oriented x  4, no focal deficits HEENT: WNL, no stridor, dry mucous membranes Cardiovascular: S1S2, RRR, no murmurs Lungs: Distant lung sounds secondary body habitus, no obvious crackles or wheezes Abdomen: +BS, soft, non-tender, no rebound or guarding. Musculoskeletal: Mild pretibial edema, no erythema, no tenderness to palpation of calves, Homan's sign negative bilaterally Skin: no rashes   LABS:  BMET  Recent Labs Lab 11/30/15 0205 12/01/15 1534 12/02/15 0340  NA 136 139 139  K 4.0 4.4 4.3  CL 108 108 108  CO2 19* 23 23  BUN 18 13 12   CREATININE 1.55* 1.33* 1.24*  GLUCOSE 199* 132* 127*    Electrolytes  Recent Labs Lab 11/30/15 0205 12/01/15 1534 12/02/15 0340  CALCIUM 9.2 9.3 9.3    CBC  Recent Labs Lab 12/02/15 0945 12/02/15 1600 12/03/15 0331  WBC 7.3 7.6 7.3  HGB 12.2 12.1 11.8*  HCT 40.3 38.9 37.4  PLT 123* 123* 117*    Coag's No results for input(s): APTT, INR in the last 168 hours.  Sepsis Markers No results for input(s): LATICACIDVEN, PROCALCITON, O2SATVEN in the last 168 hours.  ABG No results for input(s): PHART, PCO2ART, PO2ART in the last 168 hours.  Liver Enzymes No results for input(s): AST, ALT, ALKPHOS, BILITOT, ALBUMIN in the last 168 hours.  Cardiac Enzymes  Recent Labs Lab 12/01/15 1534  TROPONINI 0.03*    Glucose  Recent Labs Lab 12/02/15 1026 12/02/15 1605 12/02/15 2027  GLUCAP 138* 188* 152*    Imaging Dg Chest Port 1 View  12/02/2015  CLINICAL DATA:  75 year old female, pulmonary embolism with thrombolysis. EXAM: PORTABLE CHEST 1 VIEW COMPARISON:  11/29/2015, CT 11/29/2015 FINDINGS: Cardiomediastinal silhouette unchanged in size and contour. Calcifications of the aortic arch. Bilateral pulmonary arterial thrombolysis catheters in place, unchanged from the most recent comparison angiogram. Low lung volumes with basilar opacity. No pneumothorax or pleural effusion. IMPRESSION: Low lung volumes with basilar  opacity/consolidation. Unchanged position of bilateral pulmonary arterial thrombolysis catheters. Aortic atherosclerosis Signed, [name]. Earleen Newport, DO [facility] Radiology Electronically Signed   By: Corrie Mckusick D.O.   On: 12/02/2015 10:32   Ir Jacolyn Reedy F/u Eval Art/ven Final Day (ms)  12/02/2015  INDICATION: History of sub massive pulmonary embolism, post initiation of bilateral pulmonary arterial thrombolysis on 12/01/2015. Patient has completed 12 hours of bilateral pulmonary arterial lytic infusion and returns today to the interventional radiology suite for repeat pulmonary arterial pressure measurements. Patient reports subjective improvement in her preprocedural shortness of breath. She reports a minimal amount of losing about her nasal cannula insertion site as well as the site of a right upper extremity IV. She is otherwise without complaint. EXAM: PULMONARY ARTERIAL PRESSURE MEASUREMENT AND PULMONARY INFUSION CATHETER REMOVAL COMPARISON:  Initiation of bilateral pulmonary arterial catheter directed thrombolysis - 12/01/2015 ; chest CT - 11/29/2015; chest radiograph - earlier same day MEDICATIONS: None CONTRAST:  None FLUOROSCOPY TIME:  None COMPLICATIONS: None immediate. TECHNIQUE: Chest radiograph performed earlier this morning demonstrated unchanged positioning of the bilateral deep dose infusion catheters. Both pulmonary arterial catheter infusion wires were removed and the multi side-hole infusion catheter was retracted to the level of the main pulmonary artery. Pressure measurements were acquired from this location. At this point, the procedure was terminated.  All wires, catheters and sheaths were removed from the patient. Hemostasis was achieved at the right groin access site with manual compression. A dressing was placed. The patient tolerated the procedure well without immediate postprocedural complication. FINDINGS: Acquired pressure measurements  as follows: Preprocedural main pulmonary artery - 63/31; mean - 44 (normal: < 25/10) Postprocedural main pulmonary artery - 64/30; mean - 44 IMPRESSION: No significant reduction and mean main pulmonary arterial pressure measurement following 12 hours of bilateral ultrasound assisted pulmonary arterial lysis. PLAN: - recommend continuing IV heparin until transitioned to long-term outpatient anticoagulant as per providing clinical team. - would suggest obtaining bilateral lower extremity venous Doppler ultrasounds and consideration of IVC filter placement for temporary caval interruption as clinically indicated. Electronically Signed   By: Sandi Mariscal M.D.   On: 12/02/2015 11:26     STUDIES:  11/29/15 CXR: No acute abnormalities 11/29/15 CTA: There is a large burden of bilateral pulmonary emboli. 12/01/15 ECHO: EF 55-60%, LA mildly dilated, RV mildly dilated with moderately reduce systolic function, RA moderately dilated, PA peak pressure 92mmHg 7/11 LE duplex US: Acute DVT in right femoral, right profunda femoral, right popliteal, and left femoral veins  CULTURES: None  ANTIBIOTICS: None  SIGNIFICANT EVENTS: 7/10: Bilateral pulmonary angiography and EKOS US-assisted pulmonary thrombolytic therapy  LINES/TUBES: PIV 7/9 >>  DISCUSSION: ANJELICIA KNABB is a 75 y.o. female with medical history significant of HTN, HLD, breast cancer s/p lumpectomy/radiation, hysterectomy/BSO for endometrial cancer, and arthritis; who presents with complaints of shortness of breath and dry cough. Found to have bilateral pulmonary emboli involving the left upper and lower lobe branches and the right upper, middle and lower lobe branches with ECHO showing right heart strain. Underwent bilateral pulmonary angiography and EKOS US-assisted pulmonary thrombolytic therapy with IR on 7/10. Angiography showed significant pulmonary arterial thrombus burden bilaterally and markedly elevated pulmonary artery pressure measurements.  She was admitted to the ICU for tPA.   ASSESSMENT / PLAN:  PULMONARY A: Multiple bilateral pulmonary emboli with right heart strain s/p bilateral pulmonary angiography and EKOS US-assisted pulmonary thrombolytic therapy 7/10- stable. Orthopnea P:  O2 to maintain sats > 92% IR following. tPA infusion x 12 hours without significant change in main PA pressures. Heparin gtt per pharmacy. Switch to Eliquis today. 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 P:  Regular diet D/c Protonix. Pt was started on this for cough, which was likely due to her PE rather than GERD. Zofran as needed for nausea  HEMATOLOGIC A:  PE- possible provoked in the setting of exemestane use and sedentary lifestyle, possible malignancy with history of breast cancer and endometrial cancer P:  Heparin gtt per pharmacy. Transition to Eliquis. Consider hypercoag workup outpatient  INFECTIOUS A:  No acute issues P:  Monitor CBC and fever curve No indication for abx at this time  ENDOCRINE A:  Type II DM- last A1c 6.2% in 2011 P:  A1C ordered Stop q4hr accu-checks, as Pt hates needle sticks Monitor glucose with daily BMETs Holding Metformin in the setting of CKD and receiving contrast in IR  NEUROLOGIC A:  No acute issues P:  Monitor neuro status Low threshold to image after TPA  DISPO: -Transfer out to stepdown unit today -[facility] to  begin care 7/13   Hyman Bible, MD Family Medicine, PGY-2  12/03/2015, 8:31 AM  Attending note: Feels better.  Denies chest pain.  Alert.  HR regular.  No wheeze. Abd soft.  Hb 11.8  Assessment/plan:  Acute PE with b/l lower leg DVT s/p EKOS. -  start eliquis 7/12 - defer IVC filter  Epistaxis >> resolved.  Possible OSA. - needs outpt sleep evaluation  To SDU 7/12 >> back to [facility] 7/13 and PCCM off.  Chesley Mires, MD [facility] Pulmonary/Critical Care 12/03/2015, 11:52 AM Pager:  [phone] After 3pm call: [phone]

## 2015-12-03 NOTE — Progress Notes (Signed)
Patient went into an irregular rhythm, EKG obtained that showed A flutter RVR in the 180's.  Patient was asymptomatic at the time of this episode.  Irregular rhythm last approximately 3 minutes.  CCM doctor was notified.  Patient went back into NSR.  Will continue to monitor patient.

## 2015-12-04 DIAGNOSIS — I10 Essential (primary) hypertension: Secondary | ICD-10-CM

## 2015-12-04 DIAGNOSIS — I2699 Other pulmonary embolism without acute cor pulmonale: Secondary | ICD-10-CM | POA: Diagnosis present

## 2015-12-04 DIAGNOSIS — E669 Obesity, unspecified: Secondary | ICD-10-CM

## 2015-12-04 DIAGNOSIS — I82403 Acute embolism and thrombosis of unspecified deep veins of lower extremity, bilateral: Secondary | ICD-10-CM

## 2015-12-04 DIAGNOSIS — N179 Acute kidney failure, unspecified: Secondary | ICD-10-CM

## 2015-12-04 DIAGNOSIS — N189 Chronic kidney disease, unspecified: Secondary | ICD-10-CM

## 2015-12-04 DIAGNOSIS — E119 Type 2 diabetes mellitus without complications: Secondary | ICD-10-CM

## 2015-12-04 LAB — HEMOGLOBIN A1C
HEMOGLOBIN A1C: 6.8 % — AB (ref 4.8–5.6)
Mean Plasma Glucose: 148 mg/dL

## 2015-12-04 LAB — BASIC METABOLIC PANEL
Anion gap: 6 (ref 5–15)
BUN: 11 mg/dL (ref 6–20)
CALCIUM: 9 mg/dL (ref 8.9–10.3)
CHLORIDE: 105 mmol/L (ref 101–111)
CO2: 26 mmol/L (ref 22–32)
CREATININE: 0.96 mg/dL (ref 0.44–1.00)
GFR calc Af Amer: >60 mL/min (ref >60)
GFR calc non Af Amer: 57 mL/min — ABNORMAL LOW (ref >60)
Glucose, Bld: 120 mg/dL — ABNORMAL HIGH (ref 65–99)
Potassium: 4.1 mmol/L (ref 3.5–5.1)
SODIUM: 137 mmol/L (ref 135–145)

## 2015-12-04 NOTE — Care Management Important Message (Signed)
Important Message  Patient Details  Name: [name] MRN: [id number] Date of Birth: Jul 09, 1940   Medicare Important Message Given:  Yes    Nathen May 12/04/2015, 12:45 PM

## 2015-12-04 NOTE — Progress Notes (Signed)
PROGRESS NOTE    [name]  PRF:[id number] DOB: [birth year] DOA: 11/29/2015 PCP: Salena Saner., MD   Brief Narrative:  75 y.o. 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  S/P Lumpectomy /XRT last dose 04/2013 +Arimidex last dose 07/24/13  Who presents with complaints of shortness of breath. Symptoms started approximately 4 days ago with a dry cough. Thereafter 2 days later she noticed that she was having progressively worsening shortness of breath. Associated symptoms included lightheadedness, decreased oral intake, and aches and pains all over. She did not try anything particular to alleviate symptoms, but shortness of breath symptoms worsened with any kind of exertion. When she went to physical therapy that day the physical therapist stated that she did not appear to be quite her normal self. She followed up with her primary care doctor who recommended her to come be evaluated. Patient denies any recent travel, being sedentary, leg swelling, or calf pain. She has a history of breast cancer for which she is currently on exemastane.    Assessment & Plan:   Principal Problem:   Pulmonary embolism, bilateral (HCC) Active Problems:   Breast cancer of upper-outer quadrant of left female breast ([place])   Acute kidney injury superimposed on chronic kidney disease ([place])   Depression   Essential hypertension   Acute respiratory failure with hypoxia (HCC)   Diabetes mellitus type 2 in obese ([place])   Bilateral pulmonary embolism (HCC)   PE (pulmonary embolism)    Multiple bilateral PE with right heart strain  -Most likely cause would be Exemestane (Aromasin), vs recurrence of one of her cancers -7/10 s/p bilateral pulmonary angiography and EKOS US-assisted pulmonary thrombolytic therapy  -Titrate O2 to maintain sats  > 92% -IR following. tPA infusion x 12 hours.  -Eliquis per pharmacy. Counseled will need to remain on anticoagulation minimum of 3-6 months. 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   Consultants:  Dr. MaRolm Bookbinders Oncologist     Procedures/Significant Events:  11/29/15 CXR: No acute abnormalities 11/29/15 CTA: There is a positive large burden of bilateral pulmonary emboli. 12/01/15 ECHO: EF 55-60%, LA mildly dilated, RV mildly dilated with moderately reduce systolic function, RA moderately dilated, PA peak pressure 4630m 7/10: Bilateral pulmonary angiography and EKOS US-assisted pulmonary thrombolytic therapy 7/11 bilateral lower extremity Doppler;Bilateral lower extremities are positive for acute deep vein thrombosis involving the right femoral, right profunda femoral, right popliteal, and left femoral veins..    Cultures   Antimicrobials:    Devices    LINES / TUBES:      Continuous Infusions:    Subjective: 7/13 A/O 4, NAD. States is on a new  cancer drug Exemestane (Aromasin) 1 year. States receives her oncology care at [facility]. No long trips, airplane trips, trauma.    Objective: Filed Vitals:    12/04/15 1600 12/04/15 1700 12/04/15 1800 12/04/15 1857  BP: 104/71 109/77 111/74 107/69  Pulse: 93 92 93 96  Temp:    98 F (36.7 C)  TempSrc:    Oral  Resp: _0 Height:    _1  (1.676 m)  Weight:    126.055 kg (277 lb 14.4 oz)  SpO2: 99% 97% 97% 98%    Intake/Output Summary (Last 24 hours) at 12/04/15 2004 Last data filed at 12/04/15 1800  Gross per 24 hour  Intake   94.5 ml  Output      4 ml  Net   90.5 ml   Filed Weights   12/03/15 0600 12/04/15 0400 12/04/15 1857  Weight: 131.2 kg (289 lb 3.9 oz) 131.7 kg (290 lb 5.5 oz) 126.055 kg (277 lb 14.4 oz)    Examination:  General: A/O 4, positive acute respiratory distress (improved from admission) Eyes: negative scleral hemorrhage, negative anisocoria, negative icterus ENT: Negative Runny nose, negative gingival bleeding, Neck:  Negative scars, masses, torticollis, lymphadenopathy, JVD Lungs: Clear to auscultation bilaterally without wheezes or crackles Cardiovascular: Regular rate and rhythm without murmur gallop or rub normal S1 and S2 Abdomen: Morbidly obese, abdominal pain, nondistended, positive soft, bowel sounds, no rebound, no ascites, no appreciable mass Extremities: No significant cyanosis, clubbing, or edema bilateral lower extremities Skin: Negative rashes, lesions, ulcers Psychiatric:  Negative depression, negative anxiety, negative fatigue, negative mania  Central nervous system:  Cranial nerves II through XII intact, tongue/uvula midline, all extremities muscle strength 5/5, sensation intact throughout,  negative dysarthria, negative expressive aphasia, negative receptive aphasia.  .     Data Reviewed: Care during the described time interval was provided by me .  I have reviewed this patient's available data, including medical history, events of note, physical examination, and all test results as part of my evaluation. I have personally reviewed and interpreted all radiology  studies.  CBC:  Recent Labs Lab 11/29/15 1434  12/01/15 2125 12/02/15 0340 12/02/15 0945 12/02/15 1600 12/03/15 0331  WBC 7.9  < > 7.8 8.0 7.3 7.6 7.3  NEUTROABS 5.6  --   --   --   --   --   --   HGB 14.2  < > 12.4 12.6 12.2 12.1 11.8*  HCT 44.0  < > 40.7 40.9 40.3 38.9 37.4  MCV 85.3  < > 87.9 87.4 87.4 86.6 86.4  PLT 136*  < > 144* 124* 123* 123* 117*  < > = values in this interval not displayed. Basic Metabolic Panel:  Recent Labs Lab 11/29/15 1434 11/30/15 0205 12/01/15 1534 12/02/15 0340 12/04/15 0440  NA 138 136 139 139 137  K 4.0 4.0 4.4 4.3 4.1  CL 107 108 108 108 105  CO2 21* 19* _2 GLUCOSE 156* 199* 132* 127* 120*  BUN _3 CREATININE 1.46* 1.55* 1.33* 1.24* 0.96  CALCIUM 9.6 9.2 9.3 9.3 9.0   GFR: Estimated Creatinine Clearance: 70.9 mL/min (by C-G formula based on Cr of 0.96). Liver Function Tests: No results for input(s): AST, ALT, ALKPHOS, BILITOT, PROT, ALBUMIN in the last 168 hours. No results for input(s): LIPASE, AMYLASE in the last 168 hours. No results for input(s): AMMONIA in the last 168 hours. Coagulation Profile: No results for input(s): INR, PROTIME in  the last 168 hours. Cardiac Enzymes:  Recent Labs Lab 12/01/15 1534  TROPONINI 0.03*   BNP (last 3 results) No results for input(s): PROBNP in the last 8760 hours. HbA1C: No results for input(s): HGBA1C in the last 72 hours. CBG:  Recent Labs Lab 12/02/15 1026 12/02/15 1605 12/02/15 2027  GLUCAP 138* 188* 152*   Lipid Profile: No results for input(s): CHOL, HDL, LDLCALC, TRIG, CHOLHDL, LDLDIRECT in the last 72 hours. Thyroid Function Tests: No results for input(s): TSH, T4TOTAL, FREET4, T3FREE, THYROIDAB in the last 72 hours. Anemia Panel: No results for input(s): VITAMINB12, FOLATE, FERRITIN, TIBC, IRON, RETICCTPCT in the last 72 hours. Urine analysis:    Component Value Date/Time   COLORURINE AMBER BIOCHEMICALS MAY BE AFFECTED BY COLOR* 03/31/2010  1116   APPEARANCEUR CLOUDY* 03/31/2010 1116   LABSPEC 1.031* 03/31/2010 1116   PHURINE 5.5 03/31/2010 1116   GLUCOSEU NEGATIVE 03/31/2010 1116   HGBUR NEGATIVE 03/31/2010 1116   [place] 03/31/2010 1116   KETONESUR NEGATIVE 03/31/2010 1116   PROTEINUR NEGATIVE 03/31/2010 1116   UROBILINOGEN 0.2 03/31/2010 1116   NITRITE NEGATIVE 03/31/2010 1116   LEUKOCYTESUR  03/31/2010 1116    NEGATIVE MICROSCOPIC NOT DONE ON URINES WITH NEGATIVE PROTEIN, BLOOD, LEUKOCYTES, NITRITE, OR GLUCOSE <1000 mg/dL.   Sepsis Labs: _0 (procalcitonin:4,lacticidven:4)  ) Recent Results (from the past 240 hour(s))  MRSA PCR Screening     Status: None   Collection Time: 11/29/15  9:23 PM  Result Value Ref Range Status   MRSA by PCR NEGATIVE NEGATIVE Final    Comment:        The GeneXpert MRSA Assay (FDA approved for NASAL specimens only), is one component of a comprehensive MRSA colonization surveillance program. It is not intended to diagnose MRSA infection nor to guide or monitor treatment for MRSA infections.          Radiology Studies: No results found.      Scheduled Meds: . apixaban  10 mg Oral BID   Followed by  . [START ON 12/11/2015] apixaban  5 mg Oral BID  . buPROPion  150 mg Oral BID  . carisoprodol  350 mg Oral Q6H  . cycloSPORINE  1 drop Both Eyes BID  . diclofenac sodium  1 application Topical QID  . DULoxetine  20 mg Oral Daily  . exemestane  25 mg Oral Daily  . fenofibrate  160 mg Oral Daily  . gabapentin  300 mg Oral QHS  . metoprolol succinate  50 mg Oral BID   Continuous Infusions:    LOS: 4 days    Time spent: 40 minutes    WOODS, Geraldo Docker, MD [facility] Pager [phone]   If 7PM-7AM, please contact night-coverage [email or web address] Password [facility] 12/04/2015, 8:04 PM

## 2015-12-04 NOTE — Discharge Instructions (Signed)
Information on my medicine - ELIQUIS (apixaban)   Why was Eliquis prescribed for you? Eliquis was prescribed to treat blood clots that may have been found in the veins of your legs (deep vein thrombosis) or in your lungs (pulmonary embolism) and to reduce the risk of them occurring again.  What do You need to know about Eliquis ? The starting dose is 10 mg (two 5 mg tablets) taken TWICE daily for the FIRST SEVEN (7) DAYS, then on  12/11/15  the dose is reduced to ONE 5 mg tablet taken TWICE daily.  Eliquis may be taken with or without food.   Try to take the dose about the same time in the morning and in the evening. If you have difficulty swallowing the tablet whole please discuss with your pharmacist how to take the medication safely.  Take Eliquis exactly as prescribed and DO NOT stop taking Eliquis without talking to the doctor who prescribed the medication.  Stopping may increase your risk of developing a new blood clot.  Refill your prescription before you run out.  After discharge, you should have regular check-up appointments with your healthcare provider that is prescribing your Eliquis.    What do you do if you miss a dose? If a dose of ELIQUIS is not taken at the scheduled time, take it as soon as possible on the same day and twice-daily administration should be resumed. The dose should not be doubled to make up for a missed dose.  Important Safety Information A possible side effect of Eliquis is bleeding. You should call your healthcare provider right away if you experience any of the following: ? Bleeding from an injury or your nose that does not stop. ? Unusual colored urine (red or dark brown) or unusual colored stools (red or black). ? Unusual bruising for unknown reasons. ? A serious fall or if you hit your head (even if there is no bleeding).  Some medicines may interact with Eliquis and might increase your risk of bleeding or clotting while on Eliquis. To help  avoid this, consult your healthcare provider or pharmacist prior to using any new prescription or non-prescription medications, including herbals, vitamins, non-steroidal anti-inflammatory drugs (NSAIDs) and supplements.  This website has more information on Eliquis (apixaban): http://[email or web address].com/eliquis/home

## 2015-12-04 NOTE — Care Management Note (Signed)
Case Management Note  Patient Details  Name: [name] MRN: [id number] Date of Birth: 1941-02-08  Subjective/Objective:  Pt admitted with shortness of breath and cough                  Action/Plan:  Pt A&O.  Pt state she is home alone- uses [employer] bus for transportation needs, have [place] aide 8 hours a week with [facility] and will resume services.     Expected Discharge Date:                  Expected Discharge Plan:  [place]  In-House Referral:     Discharge planning Services  CM Consult  Post Acute Care Choice:    Choice offered to:     DME Arranged:    DME Agency:     HH Arranged:    [place] Agency:     Status of Service:  In process, will continue to follow  If discussed at Long Length of Stay Meetings, dates discussed:    Additional Comments: CM requested CM consult for possible sleep study - will need to be set up post order Maryclare Labrador, RN 12/04/2015, 2:32 PM

## 2015-12-05 DIAGNOSIS — N181 Chronic kidney disease, stage 1: Secondary | ICD-10-CM

## 2015-12-05 DIAGNOSIS — C50412 Malignant neoplasm of upper-outer quadrant of left female breast: Secondary | ICD-10-CM

## 2015-12-05 LAB — BASIC METABOLIC PANEL
Anion gap: 10 (ref 5–15)
BUN: 11 mg/dL (ref 6–20)
CALCIUM: 9.4 mg/dL (ref 8.9–10.3)
CO2: 26 mmol/L (ref 22–32)
CREATININE: 0.95 mg/dL (ref 0.44–1.00)
Chloride: 103 mmol/L (ref 101–111)
GFR calc non Af Amer: 58 mL/min — ABNORMAL LOW (ref >60)
Glucose, Bld: 123 mg/dL — ABNORMAL HIGH (ref 65–99)
Potassium: 4.4 mmol/L (ref 3.5–5.1)
SODIUM: 139 mmol/L (ref 135–145)

## 2015-12-05 LAB — GLUCOSE, CAPILLARY: Glucose-Capillary: 167 mg/dL — ABNORMAL HIGH (ref 65–99)

## 2015-12-05 MED ORDER — INSULIN ASPART 100 UNIT/ML ~~LOC~~ SOLN
0.0000 [IU] | SUBCUTANEOUS | Status: DC
  Administered 2015-12-06: 1 [IU] via SUBCUTANEOUS

## 2015-12-05 NOTE — Evaluation (Signed)
Physical Therapy Evaluation Patient Details Name: [name] MRN: [id number] DOB: 1940/12/30 Today's Date: 12/05/2015   History of Present Illness  Patient is a 75 y.o. BF PMHx Depression, HTN, HLD, Uterine cancer, Endometrial Ca S/P Hysterectomy and BSO at [facility], Left Breast Cainvasive ductal carcinoma who presents with SOB, cough, pain and weakness, positive for bilateral PE.  Clinical Impression  Patient presents with decreased independence with mobility due to deficits listed in PT problem list.  She will benefit from skilled PT in the acute setting to allow return home following SNF level rehab stay.  Lives alone and currently needing mod A for some aspect of mobility.  Has been to [facility] previously and prefers this facility.     Follow Up Recommendations SNF    Equipment Recommendations  None recommended by PT    Recommendations for Other Services       Precautions / Restrictions Precautions Precautions: Fall Restrictions Weight Bearing Restrictions: No      Mobility  Bed Mobility               General bed mobility comments: NT, pt on BSC upon entry  Transfers Overall transfer level: Needs assistance Equipment used: Rolling walker (2 wheeled) Transfers: Sit to/from [facility] Sit to Stand: Mod assist Stand pivot transfers: Min assist       General transfer comment: uses momentum to stand; assist for balance with walker to pivot to bed from [facility]  Ambulation/Gait Ambulation/Gait assistance: Min assist;+2 safety/equipment Ambulation Distance (Feet): 70 Feet (x 2) Assistive device: Rolling walker (2 wheeled) Gait Pattern/deviations: Step-through pattern;Decreased stride length;Trunk flexed;Shuffle     General Gait Details: straightens some with cues for posture, but flexed posture throughout; fatigued and sat on chair in hallway prior to walking back to room; maintained on 2L O2 throughout session with  SpO2 93%, but postive SOB with ambulation  Stairs            Wheelchair Mobility    Modified Rankin (Stroke Patients Only)       Balance Overall balance assessment: Needs assistance   Sitting balance-Leahy Scale: Good     Standing balance support: Bilateral upper extremity supported Standing balance-Leahy Scale: Poor Standing balance comment: heavy UE support for balance, did let go with one hand for perineal hygiene with min A for balance                             Pertinent Vitals/Pain Pain Assessment: Faces Faces Pain Scale: Hurts little more Pain Location: generalized Pain Descriptors / Indicators: Aching Pain Intervention(s): Repositioned;Monitored during session    Home Living Family/patient expects to be discharged to:: Skilled nursing facility Living Arrangements: Alone Available Help at Discharge: Family Type of Home: House Home Access: Stairs to enter;Ramped entrance     Home Layout: One level Home Equipment: Environmental consultant - 2 wheels      Prior Function Level of Independence: Independent with assistive device(s)         Comments: son from [place] here, but won't have assist, open to rehab and has been to [facility] in the past.      Hand Dominance        Extremity/Trunk Assessment   Upper Extremity Assessment: Defer to OT evaluation (limited shoulder mobility noted)           Lower Extremity Assessment: RLE deficits/detail;LLE deficits/detail RLE Deficits / Details: previous TKA scars bilaterally,  limited knee flexion and strength with use of momentum for functional transfers; grossly 4-/5 LLE Deficits / Details: previous TKA scars bilaterally, limited knee flexion and strength with use of momentum for functional transfers; grossly 4-/5  Cervical / Trunk Assessment: Lordotic  Communication   Communication: No difficulties  Cognition Arousal/Alertness: Awake/alert Behavior During Therapy: WFL for tasks  assessed/performed Overall Cognitive Status: Within Functional Limits for tasks assessed                      General Comments      Exercises        Assessment/Plan    PT Assessment Patient needs continued PT services  PT Diagnosis Generalized weakness   PT Problem List Decreased strength;Decreased knowledge of use of DME;Decreased balance;Decreased mobility;Decreased activity tolerance;Cardiopulmonary status limiting activity;Pain  PT Treatment Interventions DME instruction;Gait training;Functional mobility training;Patient/family education;Therapeutic activities;Therapeutic exercise;Balance training   PT Goals (Current goals can be found in the Care Plan section) Acute Rehab PT Goals Patient Stated Goal: To return to independent PT Goal Formulation: With patient Time For Goal Achievement: 12/12/15 Potential to Achieve Goals: Good    Frequency Min 3X/week   Barriers to discharge Decreased caregiver support      Co-evaluation               End of Session Equipment Utilized During Treatment: Gait belt;Oxygen Activity Tolerance: Patient limited by fatigue Patient left: in chair;with call bell/phone within reach;with family/visitor present           Time: [id number] PT Time Calculation (min) (ACUTE ONLY): 29 min   Charges:   PT Evaluation $PT Eval Moderate Complexity: 1 Procedure PT Treatments $Gait Training: 8-22 mins   PT G Codes:        Reginia Naas December 27, 2015, 4:55 PM  Magda Kiel, [place] 12/27/15

## 2015-12-05 NOTE — Progress Notes (Addendum)
PROGRESS NOTE    [name]  [id number] DOB: Jan 13, 1941 DOA: 11/29/2015 PCP: Salena Saner., MD   Brief Narrative:  75 y.o. 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  S/P Lumpectomy /XRT last dose 04/2013 +Arimidex last dose 07/24/13  Who presents with complaints of shortness of breath. Symptoms started approximately 4 days ago with a dry cough. Thereafter 2 days later she noticed that she was having progressively worsening shortness of breath. Associated symptoms included lightheadedness, decreased oral intake, and aches and pains all over. She did not try anything particular to alleviate symptoms, but shortness of breath symptoms worsened with any kind of exertion. When she went to physical therapy that day the physical therapist stated that she did not appear to be quite her normal self. She followed up with her primary care doctor who recommended her to come be evaluated. Patient denies any recent travel, being sedentary, leg swelling, or calf pain. She has a history of breast cancer for which she is currently on exemastane.    Assessment & Plan:   Principal Problem:   Pulmonary embolism, bilateral (HCC) Active Problems:   Breast cancer of upper-outer quadrant of left female breast ([place])   Acute kidney injury superimposed on chronic kidney disease ([place])   Depression   Essential hypertension   Acute respiratory failure with hypoxia (HCC)   Diabetes mellitus type 2 in obese ([place])   Bilateral pulmonary embolism (HCC)   PE (pulmonary embolism)   Pulmonary embolus, left (HCC)   Pulmonary embolus, right (HCC)   CKD (chronic kidney disease), stage I    Multiple bilateral PE with right heart strain  -Most likely cause would be Exemestane (Aromasin), vs recurrence of one of her cancers -7/10 s/p  bilateral pulmonary angiography and EKOS US-assisted pulmonary thrombolytic therapy  -Titrate O2 to maintain sats > 92% -IR following. tPA infusion x 12 hours.  -Eliquis per pharmacy. Counseled will need to remain on anticoagulation minimum of 3-6 months. Will follow-up with her Oncologist to determine if lifelong treatment vs stopping Exemestane (Aromasin) -Hypercoagulability workup per oncology -Will likely need outpatient sleep study  Bilateral lower extremity acute DVT -Eliquis per pharmacy -See pulmonary emboli  HTN- stable -Continue Metoprolol 87m bid  HLD Continue Fenofibrate 1683mdaily  CKD stage 1- stable after contrast Lab Results  Component Value Date   CREATININE 0.97 12/06/2015   CREATININE 0.95 12/05/2015   CREATININE 0.96 12/04/2015  -resolved  Type II DM- last A1c 6.2% in 2011 -Hemoglobin A1C ordered -Sensitive SSI  -Holding Metformin in the setting of CKD and having received contrast in IR    DVT prophylaxis: Eliquis  Code Status: Full Family Communication: None Disposition Plan: SNF?   Consultants:  Dr. MaRolm Bookbinders Oncologist     Procedures/Significant Events:  11/29/15 CXR: No acute abnormalities 11/29/15 CTA: There is a positive large burden of bilateral pulmonary emboli. 12/01/15 ECHO: EF 55-60%, LA mildly dilated, RV mildly dilated with moderately reduce systolic function, RA moderately dilated, PA peak pressure 4679m 7/10: Bilateral pulmonary angiography and EKOS US-assisted pulmonary thrombolytic therapy 7/11 bilateral lower extremity Doppler;Bilateral lower extremities are positive for acute deep vein thrombosis involving the right femoral, right profunda femoral, right popliteal, and left femoral veins..    Cultures   Antimicrobials:    Devices    LINES / TUBES:  Continuous Infusions:    Subjective: 7/14 A/O 4, NAD. States is on a new cancer drug Exemestane (Aromasin) 1 year. States receives her oncology  care at [facility]. Has ambulated around ward with assistance. Positive SOB, negative CP    Objective: Filed Vitals:   12/05/15 1659 12/05/15 2006 12/06/15 0000 12/06/15 0400  BP: 124/70 125/67 105/67 109/66  Pulse: 95     Temp: 97.9 F (36.6 C) 98.3 F (36.8 C) 98.5 F (36.9 C) 97.3 F (36.3 C)  TempSrc: Oral Oral Oral Oral  Resp: 24 32 26 20  Height:      Weight:      SpO2: 97% 97% 92% 96%    Intake/Output Summary (Last 24 hours) at 12/06/15 0839 Last data filed at 12/06/15 0450  Gross per 24 hour  Intake    240 ml  Output    801 ml  Net   -561 ml   Filed Weights   12/03/15 0600 12/04/15 0400 12/04/15 1857  Weight: 131.2 kg (289 lb 3.9 oz) 131.7 kg (290 lb 5.5 oz) 126.055 kg (277 lb 14.4 oz)    Examination:  General: A/O 4, positive acute respiratory distress (improved from admission) Eyes: negative scleral hemorrhage, negative anisocoria, negative icterus ENT: Negative Runny nose, negative gingival bleeding, Neck:  Negative scars, masses, torticollis, lymphadenopathy, JVD Lungs: Clear to auscultation bilaterally without wheezes or crackles Cardiovascular: Regular rate and rhythm without murmur gallop or rub normal S1 and S2 Abdomen: Morbidly obese, abdominal pain, nondistended, positive soft, bowel sounds, no rebound, no ascites, no appreciable mass Extremities: No significant cyanosis, clubbing, or edema bilateral lower extremities Skin: Negative rashes, lesions, ulcers Psychiatric:  Negative depression, negative anxiety, negative fatigue, negative mania  Central nervous system:  Cranial nerves II through XII intact, tongue/uvula midline, all extremities muscle strength 5/5, sensation intact throughout,  negative dysarthria, negative expressive aphasia, negative receptive aphasia.  .     Data Reviewed: Care during the described time interval was provided by me .  I have reviewed this patient's available data, including medical history, events of note, physical  examination, and all test results as part of my evaluation. I have personally reviewed and interpreted all radiology studies.  CBC:  Recent Labs Lab 11/29/15 1434  12/02/15 0340 12/02/15 0945 12/02/15 1600 12/03/15 0331 12/06/15 0350  WBC 7.9  < > 8.0 7.3 7.6 7.3 6.1  NEUTROABS 5.6  --   --   --   --   --   --   HGB 14.2  < > 12.6 12.2 12.1 11.8* 11.8*  HCT 44.0  < > 40.9 40.3 38.9 37.4 38.4  MCV 85.3  < > 87.4 87.4 86.6 86.4 87.7  PLT 136*  < > 124* 123* 123* 117* 199  < > = values in this interval not displayed. Basic Metabolic Panel:  Recent Labs Lab 12/01/15 1534 12/02/15 0340 12/04/15 0440 12/05/15 0257 12/06/15 0350  NA 139 139 137 139 140  K 4.4 4.3 4.1 4.4 4.4  CL 108 108 105 103 106  CO2 _0 GLUCOSE 132* 127* 120* 123* 114*  BUN _1 CREATININE 1.33* 1.24* 0.96 0.95 0.97  CALCIUM 9.3 9.3 9.0 9.4 9.5  MG  --   --   --   --  2.1   GFR: Estimated Creatinine Clearance: 70.1 mL/min (by C-G formula based on Cr of 0.97). Liver Function Tests: No results for input(s): AST, ALT, ALKPHOS, BILITOT, PROT, ALBUMIN in  the last 168 hours. No results for input(s): LIPASE, AMYLASE in the last 168 hours. No results for input(s): AMMONIA in the last 168 hours. Coagulation Profile: No results for input(s): INR, PROTIME in the last 168 hours. Cardiac Enzymes:  Recent Labs Lab 12/01/15 1534  TROPONINI 0.03*   BNP (last 3 results) No results for input(s): PROBNP in the last 8760 hours. HbA1C:  Recent Labs  12/04/15 0440  HGBA1C 6.8*   CBG:  Recent Labs Lab 12/02/15 2027 12/05/15 2026 12/06/15 0106 12/06/15 0505 12/06/15 0802  GLUCAP 152* 167* 127* 132* 104*   Lipid Profile: No results for input(s): CHOL, HDL, LDLCALC, TRIG, CHOLHDL, LDLDIRECT in the last 72 hours. Thyroid Function Tests: No results for input(s): TSH, T4TOTAL, FREET4, T3FREE, THYROIDAB in the last 72 hours. Anemia Panel: No results for input(s): VITAMINB12, FOLATE,  FERRITIN, TIBC, IRON, RETICCTPCT in the last 72 hours. Urine analysis:    Component Value Date/Time   COLORURINE AMBER BIOCHEMICALS MAY BE AFFECTED BY COLOR* 03/31/2010 1116   APPEARANCEUR CLOUDY* 03/31/2010 1116   LABSPEC 1.031* 03/31/2010 1116   PHURINE 5.5 03/31/2010 1116   GLUCOSEU NEGATIVE 03/31/2010 1116   HGBUR NEGATIVE 03/31/2010 1116   [place] 03/31/2010 1116   KETONESUR NEGATIVE 03/31/2010 1116   PROTEINUR NEGATIVE 03/31/2010 1116   UROBILINOGEN 0.2 03/31/2010 1116   NITRITE NEGATIVE 03/31/2010 1116   LEUKOCYTESUR  03/31/2010 1116    NEGATIVE MICROSCOPIC NOT DONE ON URINES WITH NEGATIVE PROTEIN, BLOOD, LEUKOCYTES, NITRITE, OR GLUCOSE <1000 mg/dL.   Sepsis Labs: _0 (procalcitonin:4,lacticidven:4)  ) Recent Results (from the past 240 hour(s))  MRSA PCR Screening     Status: None   Collection Time: 11/29/15  9:23 PM  Result Value Ref Range Status   MRSA by PCR NEGATIVE NEGATIVE Final    Comment:        The GeneXpert MRSA Assay (FDA approved for NASAL specimens only), is one component of a comprehensive MRSA colonization surveillance program. It is not intended to diagnose MRSA infection nor to guide or monitor treatment for MRSA infections.          Radiology Studies: No results found.      Scheduled Meds: . apixaban  10 mg Oral BID   Followed by  . [START ON 12/11/2015] apixaban  5 mg Oral BID  . buPROPion  150 mg Oral BID  . carisoprodol  350 mg Oral Q6H  . cycloSPORINE  1 drop Both Eyes BID  . diclofenac sodium  1 application Topical QID  . DULoxetine  20 mg Oral Daily  . exemestane  25 mg Oral Daily  . fenofibrate  160 mg Oral Daily  . gabapentin  300 mg Oral QHS  . insulin aspart  0-9 Units Subcutaneous Q4H  . metoprolol succinate  50 mg Oral BID   Continuous Infusions:    LOS: 6 days    Time spent: 40 minutes    WOODS, Geraldo Docker, MD [facility] Pager [phone]   If 7PM-7AM, please contact  night-coverage [email or web address] Password [facility] 12/06/2015, 8:39 AM

## 2015-12-06 DIAGNOSIS — F329 Major depressive disorder, single episode, unspecified: Secondary | ICD-10-CM

## 2015-12-06 DIAGNOSIS — N181 Chronic kidney disease, stage 1: Secondary | ICD-10-CM | POA: Diagnosis present

## 2015-12-06 DIAGNOSIS — I2699 Other pulmonary embolism without acute cor pulmonale: Secondary | ICD-10-CM | POA: Diagnosis present

## 2015-12-06 LAB — CBC
HCT: 38.4 % (ref 36.0–46.0)
Hemoglobin: 11.8 g/dL — ABNORMAL LOW (ref 12.0–15.0)
MCH: 26.9 pg (ref 26.0–34.0)
MCHC: 30.7 g/dL (ref 30.0–36.0)
MCV: 87.7 fL (ref 78.0–100.0)
PLATELETS: 199 10*3/uL (ref 150–400)
RBC: 4.38 MIL/uL (ref 3.87–5.11)
RDW: 15.6 % — AB (ref 11.5–15.5)
WBC: 6.1 10*3/uL (ref 4.0–10.5)

## 2015-12-06 LAB — GLUCOSE, CAPILLARY
GLUCOSE-CAPILLARY: 127 mg/dL — AB (ref 65–99)
GLUCOSE-CAPILLARY: 132 mg/dL — AB (ref 65–99)
GLUCOSE-CAPILLARY: 152 mg/dL — AB (ref 65–99)
GLUCOSE-CAPILLARY: 121 mg/dL — AB (ref 65–99)
Glucose-Capillary: 104 mg/dL — ABNORMAL HIGH (ref 65–99)

## 2015-12-06 LAB — BASIC METABOLIC PANEL
ANION GAP: 8 (ref 5–15)
BUN: 13 mg/dL (ref 6–20)
CALCIUM: 9.5 mg/dL (ref 8.9–10.3)
CO2: 26 mmol/L (ref 22–32)
CREATININE: 0.97 mg/dL (ref 0.44–1.00)
Chloride: 106 mmol/L (ref 101–111)
GFR, EST NON AFRICAN AMERICAN: 57 mL/min — AB (ref >60)
GLUCOSE: 114 mg/dL — AB (ref 65–99)
Potassium: 4.4 mmol/L (ref 3.5–5.1)
Sodium: 140 mmol/L (ref 135–145)

## 2015-12-06 LAB — MAGNESIUM: MAGNESIUM: 2.1 mg/dL (ref 1.7–2.4)

## 2015-12-06 MED ORDER — INSULIN ASPART 100 UNIT/ML ~~LOC~~ SOLN
0.0000 [IU] | Freq: Three times a day (TID) | SUBCUTANEOUS | Status: DC
Start: 2015-12-06 — End: 2015-12-07
  Administered 2015-12-06: 1 [IU] via SUBCUTANEOUS
  Administered 2015-12-06: 2 [IU] via SUBCUTANEOUS

## 2015-12-06 NOTE — Progress Notes (Addendum)
PROGRESS NOTE    [name]  LDJ:[id number] DOB: Jan 13, 1941 DOA: 11/29/2015 PCP: Salena Saner., MD   Brief Narrative:  75 y.o. 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  S/P Lumpectomy /XRT last dose 04/2013 +Arimidex last dose 07/24/13  Who presents with complaints of shortness of breath. Symptoms started approximately 4 days ago with a dry cough. Thereafter 2 days later she noticed that she was having progressively worsening shortness of breath. Associated symptoms included lightheadedness, decreased oral intake, and aches and pains all over. She did not try anything particular to alleviate symptoms, but shortness of breath symptoms worsened with any kind of exertion. When she went to physical therapy that day the physical therapist stated that she did not appear to be quite her normal self. She followed up with her primary care doctor who recommended her to come be evaluated. Patient denies any recent travel, being sedentary, leg swelling, or calf pain. She has a history of breast cancer for which she is currently on exemastane.    Assessment & Plan:   Principal Problem:   Pulmonary embolism, bilateral (HCC) Active Problems:   Breast cancer of upper-outer quadrant of left female breast ([place])   Acute kidney injury superimposed on chronic kidney disease ([place])   Depression   Essential hypertension   Acute respiratory failure with hypoxia (HCC)   Diabetes mellitus type 2 in obese ([place])   Bilateral pulmonary embolism (HCC)   PE (pulmonary embolism)   Pulmonary embolus, left (HCC)   Pulmonary embolus, right (HCC)   CKD (chronic kidney disease), stage I    Multiple bilateral PE with right heart strain  -Most likely cause would be Exemestane (Aromasin), vs recurrence of one of her cancers -7/10 s/p  bilateral pulmonary angiography and EKOS US-assisted pulmonary thrombolytic therapy  -Titrate O2 to maintain sats > 92% -IR following. tPA infusion x 12 hours.  -Eliquis per pharmacy. Counseled will need to remain on anticoagulation minimum of 3-6 months. Will follow-up with her Oncologist to determine if lifelong treatment vs stopping Exemestane (Aromasin) -Hypercoagulability workup per oncology -Will likely need outpatient sleep study  Bilateral lower extremity acute DVT -Eliquis per pharmacy -See pulmonary emboli  HTN- stable -Continue Metoprolol 87m bid  HLD Continue Fenofibrate 1683mdaily  CKD stage 1- stable after contrast Lab Results  Component Value Date   CREATININE 0.97 12/06/2015   CREATININE 0.95 12/05/2015   CREATININE 0.96 12/04/2015  -resolved  Type II DM- last A1c 6.2% in 2011 -Hemoglobin A1C ordered -Sensitive SSI  -Holding Metformin in the setting of CKD and having received contrast in IR    DVT prophylaxis: Eliquis  Code Status: Full Family Communication: None Disposition Plan: SNF?   Consultants:  Dr. MaRolm Bookbinders Oncologist     Procedures/Significant Events:  11/29/15 CXR: No acute abnormalities 11/29/15 CTA: There is a positive large burden of bilateral pulmonary emboli. 12/01/15 ECHO: EF 55-60%, LA mildly dilated, RV mildly dilated with moderately reduce systolic function, RA moderately dilated, PA peak pressure 4679m 7/10: Bilateral pulmonary angiography and EKOS US-assisted pulmonary thrombolytic therapy 7/11 bilateral lower extremity Doppler;Bilateral lower extremities are positive for acute deep vein thrombosis involving the right femoral, right profunda femoral, right popliteal, and left femoral veins..    Cultures   Antimicrobials:    Devices    LINES / TUBES:  Continuous Infusions:    Subjective: 7/15 A/O 4, NAD. Sitting in bed comfortably. Waiting for visit from [place] to discuss SNF.   Objective: Filed  Vitals:   12/05/15 2006 12/06/15 0000 12/06/15 0400 12/06/15 0800  BP: 125/67 105/67 109/66 107/68  Pulse:    88  Temp: 98.3 F (36.8 C) 98.5 F (36.9 C) 97.3 F (36.3 C) 98.7 F (37.1 C)  TempSrc: Oral Oral Oral Oral  Resp: 32 '26 20 19  '$ Height:      Weight:      SpO2: 97% 92% 96% 95%    Intake/Output Summary (Last 24 hours) at 12/06/15 1000 Last data filed at 12/06/15 0450  Gross per 24 hour  Intake    240 ml  Output    800 ml  Net   -560 ml   Filed Weights   12/03/15 0600 12/04/15 0400 12/04/15 1857  Weight: 131.2 kg (289 lb 3.9 oz) 131.7 kg (290 lb 5.5 oz) 126.055 kg (277 lb 14.4 oz)    Examination:  General: A/O 4, positive acute respiratory distress (improved from admission) Eyes: negative scleral hemorrhage, negative anisocoria, negative icterus ENT: Negative Runny nose, negative gingival bleeding, Neck:  Negative scars, masses, torticollis, lymphadenopathy, JVD Lungs: Clear to auscultation bilaterally without wheezes or crackles Cardiovascular: Regular rate and rhythm without murmur gallop or rub normal S1 and S2 Abdomen: Morbidly obese, abdominal pain, nondistended, positive soft, bowel sounds, no rebound, no ascites, no appreciable mass Extremities: No significant cyanosis, clubbing, or edema bilateral lower extremities Skin: Negative rashes, lesions, ulcers Psychiatric:  Negative depression, negative anxiety, negative fatigue, negative mania  Central nervous system:  Cranial nerves II through XII intact, tongue/uvula midline, all extremities muscle strength 5/5, sensation intact throughout,  negative dysarthria, negative expressive aphasia, negative receptive aphasia.  .     Data Reviewed: Care during the described time interval was provided by me .  I have reviewed this patient's available data, including medical history, events of note, physical examination, and all test results as part of my evaluation. I have personally reviewed and interpreted all  radiology studies.  CBC:  Recent Labs Lab 11/29/15 1434  12/02/15 0340 12/02/15 0945 12/02/15 1600 12/03/15 0331 12/06/15 0350  WBC 7.9  < > 8.0 7.3 7.6 7.3 6.1  NEUTROABS 5.6  --   --   --   --   --   --   HGB 14.2  < > 12.6 12.2 12.1 11.8* 11.8*  HCT 44.0  < > 40.9 40.3 38.9 37.4 38.4  MCV 85.3  < > 87.4 87.4 86.6 86.4 87.7  PLT 136*  < > 124* 123* 123* 117* 199  < > = values in this interval not displayed. Basic Metabolic Panel:  Recent Labs Lab 12/01/15 1534 12/02/15 0340 12/04/15 0440 12/05/15 0257 12/06/15 0350  NA 139 139 137 139 140  K 4.4 4.3 4.1 4.4 4.4  CL 108 108 105 103 106  CO2 '23 23 26 26 26  '$ GLUCOSE 132* 127* 120* 123* 114*  BUN '13 12 11 11 13  '$ CREATININE 1.33* 1.24* 0.96 0.95 0.97  CALCIUM 9.3 9.3 9.0 9.4 9.5  MG  --   --   --   --  2.1   GFR: Estimated Creatinine Clearance: 70.1 mL/min (by C-G formula based on Cr of 0.97). Liver Function Tests: No results for input(s): AST, ALT, ALKPHOS, BILITOT, PROT, ALBUMIN in the last 168 hours. No results for input(s): LIPASE, AMYLASE in the last 168 hours. No results for  input(s): AMMONIA in the last 168 hours. Coagulation Profile: No results for input(s): INR, PROTIME in the last 168 hours. Cardiac Enzymes:  Recent Labs Lab 12/01/15 1534  TROPONINI 0.03*   BNP (last 3 results) No results for input(s): PROBNP in the last 8760 hours. HbA1C:  Recent Labs  12/04/15 0440  HGBA1C 6.8*   CBG:  Recent Labs Lab 12/02/15 2027 12/05/15 2026 12/06/15 0106 12/06/15 0505 12/06/15 0802  GLUCAP 152* 167* 127* 132* 104*   Lipid Profile: No results for input(s): CHOL, HDL, LDLCALC, TRIG, CHOLHDL, LDLDIRECT in the last 72 hours. Thyroid Function Tests: No results for input(s): TSH, T4TOTAL, FREET4, T3FREE, THYROIDAB in the last 72 hours. Anemia Panel: No results for input(s): VITAMINB12, FOLATE, FERRITIN, TIBC, IRON, RETICCTPCT in the last 72 hours. Urine analysis:    Component Value Date/Time    COLORURINE AMBER BIOCHEMICALS MAY BE AFFECTED BY COLOR* 03/31/2010 1116   APPEARANCEUR CLOUDY* 03/31/2010 1116   LABSPEC 1.031* 03/31/2010 1116   PHURINE 5.5 03/31/2010 1116   GLUCOSEU NEGATIVE 03/31/2010 1116   HGBUR NEGATIVE 03/31/2010 1116   [place] 03/31/2010 1116   KETONESUR NEGATIVE 03/31/2010 1116   PROTEINUR NEGATIVE 03/31/2010 1116   UROBILINOGEN 0.2 03/31/2010 1116   NITRITE NEGATIVE 03/31/2010 1116   LEUKOCYTESUR  03/31/2010 1116    NEGATIVE MICROSCOPIC NOT DONE ON URINES WITH NEGATIVE PROTEIN, BLOOD, LEUKOCYTES, NITRITE, OR GLUCOSE <1000 mg/dL.   Sepsis Labs: '@LABRCNTIP'$ (procalcitonin:4,lacticidven:4)  ) Recent Results (from the past 240 hour(s))  MRSA PCR Screening     Status: None   Collection Time: 11/29/15  9:23 PM  Result Value Ref Range Status   MRSA by PCR NEGATIVE NEGATIVE Final    Comment:        The GeneXpert MRSA Assay (FDA approved for NASAL specimens only), is one component of a comprehensive MRSA colonization surveillance program. It is not intended to diagnose MRSA infection nor to guide or monitor treatment for MRSA infections.          Radiology Studies: No results found.      Scheduled Meds: . apixaban  10 mg Oral BID   Followed by  . [START ON 12/11/2015] apixaban  5 mg Oral BID  . buPROPion  150 mg Oral BID  . carisoprodol  350 mg Oral Q6H  . cycloSPORINE  1 drop Both Eyes BID  . diclofenac sodium  1 application Topical QID  . DULoxetine  20 mg Oral Daily  . exemestane  25 mg Oral Daily  . fenofibrate  160 mg Oral Daily  . gabapentin  300 mg Oral QHS  . insulin aspart  0-9 Units Subcutaneous Q4H  . metoprolol succinate  50 mg Oral BID   Continuous Infusions:    LOS: 6 days    Time spent: 40 minutes    Breeze Angell, Geraldo Docker, MD [facility] Pager [phone]   If 7PM-7AM, please contact night-coverage [email or web address] Password [email or web address] 12/06/2015, 10:00 AM

## 2015-12-06 NOTE — Progress Notes (Signed)
SATURATION QUALIFICATIONS: (This note is used to comply with regulatory documentation for home oxygen)  Patient Saturations on Room Air at Rest = 95  Patient Saturations on Room Air while Ambulating = 90  Patient Saturations on 0 Liters of oxygen while Ambulating = 90  Please briefly explain why patient needs home oxygen: Patient ambulated less than 155ft due to fatigue.

## 2015-12-07 LAB — GLUCOSE, CAPILLARY
Glucose-Capillary: 107 mg/dL — ABNORMAL HIGH (ref 65–99)
Glucose-Capillary: 139 mg/dL — ABNORMAL HIGH (ref 65–99)

## 2015-12-07 MED ORDER — METFORMIN HCL 500 MG PO TABS
500.0000 mg | ORAL_TABLET | Freq: Two times a day (BID) | ORAL | Status: DC
  Administered 2015-12-07: 500 mg via ORAL
  Filled 2015-12-07: qty 1

## 2015-12-07 MED ORDER — INSULIN ASPART 100 UNIT/ML ~~LOC~~ SOLN: 0.0000 [IU] | Freq: Two times a day (BID) | SUBCUTANEOUS | Status: DC

## 2015-12-07 MED ORDER — MAGNESIUM CITRATE PO SOLN
1.0000 | Freq: Once | ORAL | Status: AC
  Administered 2015-12-07: 1 via ORAL
  Filled 2015-12-07: qty 296

## 2015-12-07 NOTE — Clinical Social Work Placement (Signed)
   CLINICAL SOCIAL WORK PLACEMENT  NOTE  Date:  12/07/2015  Patient Details  Name: [name] MRN: [id number] Date of Birth: 05/28/1940  Clinical Social Work is seeking post-discharge placement for this patient at the [place] level of care (*CSW will initial, date and re-position this form in  chart as items are completed):  Yes   Patient/family provided with [place] Work Department's list of facilities offering this level of care within the geographic area requested by the patient (or if unable, by the patient's family).  Yes   Patient/family informed of their freedom to choose among providers that offer the needed level of care, that participate in Medicare, Medicaid or managed care program needed by the patient, have an available bed and are willing to accept the patient.  Yes   Patient/family informed of [facility]'s ownership interest in [facility] and [facility], as well as of the fact that they are under no obligation to receive care at these facilities.  PASRR submitted to EDS on 12/07/15     PASRR number received on       Existing PASRR number confirmed on 12/07/15     FL2 transmitted to all facilities in geographic area requested by pt/family on 12/07/15     FL2 transmitted to all facilities within larger geographic area on       Patient informed that his/her managed care company has contracts with or will negotiate with certain facilities, including the following:            Patient/family informed of bed offers received.  Patient chooses bed at       Physician recommends and patient chooses bed at      Patient to be transferred to   on  .  Patient to be transferred to facility by       Patient family notified on   of transfer.  Name of family member notified:        PHYSICIAN Please sign FL2     Additional Comment:    _______________________________________________ [name], LCSW 12/07/2015,  12:02 PM

## 2015-12-07 NOTE — NC FL2 (Signed)
[place] MEDICAID FL2 LEVEL OF CARE SCREENING TOOL     IDENTIFICATION  Patient Name: [name] Birthdate: [birth year] Sex: female Admission Date (Current Location): 11/29/2015  [facility] [place] Number:  Herbalist and Address:  [place] [place] [address] [place], [place] [address]      Provider Number: [id number]  Attending Physician Name and Address:  [name], MD  Relative Name and Phone Number:       Current Level of Care: Hospital Recommended Level of Care: [place] Prior Approval Number:    Date Approved/Denied:   PASRR Number: [id number]  Discharge Plan: SNF    Current Diagnoses: Patient Active Problem List   Diagnosis Date Noted  . Pulmonary embolus, left ([place])   . Pulmonary embolus, right ([place])   . CKD (chronic kidney disease), stage I   . Bilateral pulmonary embolism ([place])   . PE (pulmonary embolism)   . Acute kidney injury superimposed on chronic kidney disease ([place]) 11/30/2015  . Depression 11/30/2015  . Essential hypertension 11/30/2015  . Acute respiratory failure with hypoxia ([place]) 11/30/2015  . Diabetes mellitus type 2 in obese ([place]) 11/30/2015  . Pulmonary embolism ([place]) 11/29/2015  . Pulmonary embolism, bilateral ([place]) 11/29/2015  . Pain in lower limb 01/10/2015  . Breast cancer of upper-outer quadrant of left female breast ([place]) 01/24/2013  . Edema 01/09/2013  . Onychomycosis 01/09/2013  . Pain in joint, ankle and foot 01/09/2013    Orientation RESPIRATION BLADDER Height & Weight     Self, Time, Situation, Place  Normal Continent Weight: 277 lb 14.4 oz (126.055 kg) Height:  5\' 6"  (167.6 cm)  BEHAVIORAL SYMPTOMS/MOOD NEUROLOGICAL BOWEL NUTRITION STATUS   (NONE )  (NONE ) Continent Diet (CARB MODIFIED )  AMBULATORY STATUS COMMUNICATION OF NEEDS Skin   Limited Assist Verbally Normal                       Personal Care Assistance Level of Assistance  Dressing,  Bathing, Feeding Bathing Assistance: Limited assistance Feeding assistance: Independent Dressing Assistance: Limited assistance     Functional Limitations Info  Speech, Hearing, Sight Sight Info: Adequate Hearing Info: Adequate Speech Info: Adequate    SPECIAL CARE FACTORS FREQUENCY  PT (By licensed PT), OT (By licensed OT)     PT Frequency: 3 OT Frequency: 2            Contractures      Additional Factors Info  Code Status, Allergies Code Status Info: FULL CODE  Allergies Info: Iodine           Current Medications (12/07/2015):  This is the current hospital active medication list Current Facility-Administered Medications  Medication Dose Route Frequency Provider Last Rate Last Dose  . albuterol (PROVENTIL) (2.5 MG/3ML) 0.083% nebulizer solution 2.5 mg  2.5 mg Nebulization Q4H PRN Norval Morton, MD      . apixaban (ELIQUIS) tablet 10 mg  10 mg Oral BID Jake Church Masters, RPH   10 mg at 12/07/15 [id number]   Followed by  . [START ON 12/11/2015] apixaban (ELIQUIS) tablet 5 mg  5 mg Oral BID Jake Church Masters, RPH      . buPROPion [facility] SR) 12 hr tablet 150 mg  150 mg Oral BID Norval Morton, MD   150 mg at 12/07/15 0950  . carisoprodol (SOMA) tablet 350 mg  350 mg Oral Q6H Rondell Charmayne Sheer, MD   350 mg at  12/07/15 0950  . cycloSPORINE (RESTASIS) 0.05 % ophthalmic emulsion 1 drop  1 drop Both Eyes BID Norval Morton, MD   1 drop at 12/07/15 0951  . diclofenac sodium (VOLTAREN) 1 % transdermal gel 1 application  1 application Topical QID Norval Morton, MD   1 application at 0000000 [phone]  . DULoxetine (CYMBALTA) DR capsule 20 mg  20 mg Oral Daily Kelvin Cellar, MD   20 mg at 12/07/15 0950  . exemestane (AROMASIN) tablet 25 mg  25 mg Oral Daily Honor Loh, RPH   25 mg at 12/07/15 0951  . fenofibrate tablet 160 mg  160 mg Oral Daily Norval Morton, MD   160 mg at 12/07/15 0951  . gabapentin (NEURONTIN) capsule 300 mg  300 mg Oral QHS Norval Morton, MD   300 mg at  12/06/15 2130  . HYDROcodone-acetaminophen (NORCO/VICODIN) 5-325 MG per tablet 1 tablet  1 tablet Oral Q6H PRN Norval Morton, MD   1 tablet at 12/04/15 2254  . insulin aspart (novoLOG) injection 0-9 Units  0-9 Units Subcutaneous BID WC [name], MD      . ipratropium (ATROVENT) nebulizer solution 0.5 mg  0.5 mg Nebulization Q4H PRN Norval Morton, MD      . metFORMIN (GLUCOPHAGE) tablet 500 mg  500 mg Oral BID WC [name], MD      . metoprolol (LOPRESSOR) injection 2.5-5 mg  2.5-5 mg Intravenous Q3H PRN Corey Harold, NP   4 mg at 12/02/15 2340  . metoprolol succinate (TOPROL-XL) 24 hr tablet 50 mg  50 mg Oral BID Norval Morton, MD   50 mg at 12/07/15 0951  . ondansetron (ZOFRAN) tablet 4 mg  4 mg Oral Q6H PRN Norval Morton, MD       Or  . ondansetron (ZOFRAN) injection 4 mg  4 mg Intravenous Q6H PRN Norval Morton, MD         Discharge Medications: Please see discharge summary for a list of discharge medications.  Relevant Imaging Results:  Relevant Lab Results:   Additional Information SSN [id number]  [name], LCSW

## 2015-12-07 NOTE — Progress Notes (Signed)
Voicemail left for patient son Rayshonda Edenburn with patients new room #.

## 2015-12-07 NOTE — Clinical Social Work Note (Signed)
Clinical Social Work Assessment  Patient Details  Name: [name] MRN: [id number] Date of Birth: 1940/10/21  Date of referral:  12/07/15               Reason for consult:  Facility Placement, Discharge Planning                Permission sought to share information with:  Family Supports, Customer service manager, Case Optician, dispensing granted to share information::  Yes, Verbal Permission Granted  Name::      [name] )  Agency::   (SNF's )  Relationship::   (Son)  Contact Information:   [phone])  Housing/Transportation Living arrangements for the past 2 months:  Single Family Home Source of Information:  Patient, Adult Children Patient Interpreter Needed:  None Criminal Activity/Legal Involvement Pertinent to Current Situation/Hospitalization:  No - Comment as needed Significant Relationships:  Adult Children Lives with:  Self Do you feel safe going back to the place where you live?  No Need for family participation in patient care:  Yes (Comment)  Care giving concerns:  PT recommending short-term rehab at skilled level.    Social Worker assessment / plan: [place] representative met with patient in reference to post-acute placement for SNF. CSW introduced CSW role and SNF process. SNF list provided. Patient has reported she prefers [facility] as she has been there before in the past. Patient asked several questions in regards to services provided at SNF such as: beauty salon, activities etc. CSW advised patient to review questions with facility representative once arriving. Pt also requesting private room.  CSW also contacted pt's son, [name] and reviewed what to expect on day of d/c and what patient will likely need at facility. No further concerns reported at this time. CSW remains available as needed.   Employment status:  Retired Nurse, adult PT Recommendations:  [place] / Referral to community resources:  [place]  Patient/Family's Response to care:  Patient a/o x4. Patient agreeable to SNF at [facility]/ private room and knows exactly what she wants in regards to SNF process. Pt is okay if private room is not available. Pt's son also agreeable to SNF, supportive and involved in care. Pt and family appreciated social work intervention.   Patient/Family's Understanding of and Emotional Response to Diagnosis, Current Treatment, and Prognosis: Pt and son knowledgeable of medical interventions and continued treatment at skilled level.   Emotional Assessment Appearance:  Appears stated age Attitude/Demeanor/Rapport:   (Pleasant ) Affect (typically observed):  Accepting, Appropriate, Pleasant Orientation:  Oriented to Situation, Oriented to  Time, Oriented to Place, Oriented to Self Alcohol / Substance use:  Not Applicable Psych involvement (Current and /or in the community):  No (Comment)  Discharge Needs  Concerns to be addressed:  Care Coordination Readmission within the last 30 days:  No Current discharge risk:  Dependent with Mobility Barriers to Discharge:  Continued Medical Work up   [name], LCSW 12/07/2015, 12:07 PM

## 2015-12-07 NOTE — Progress Notes (Signed)
PROGRESS NOTE    [name]  LGX:[id number] DOB: May 10, 1941 DOA: 11/29/2015 PCP: Salena Saner., MD   Brief Narrative:  75 y.o. 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  S/P Lumpectomy /XRT last dose 04/2013 +Arimidex last dose 07/24/13  Who presents with complaints of shortness of breath. Symptoms started approximately 4 days ago with a dry cough. Thereafter 2 days later she noticed that she was having progressively worsening shortness of breath. Associated symptoms included lightheadedness, decreased oral intake, and aches and pains all over. She did not try anything particular to alleviate symptoms, but shortness of breath symptoms worsened with any kind of exertion. When she went to physical therapy that day the physical therapist stated that she did not appear to be quite her normal self. She followed up with her primary care doctor who recommended her to come be evaluated. Patient denies any recent travel, being sedentary, leg swelling, or calf pain. She has a history of breast cancer for which she is currently on exemastane.    Assessment & Plan:   Principal Problem:   Pulmonary embolism, bilateral (HCC) Active Problems:   Breast cancer of upper-outer quadrant of left female breast ([place])   Acute kidney injury superimposed on chronic kidney disease ([place])   Depression   Essential hypertension   Acute respiratory failure with hypoxia (HCC)   Diabetes mellitus type 2 in obese ([place])   Bilateral pulmonary embolism (HCC)   PE (pulmonary embolism)   Pulmonary embolus, left (HCC)   Pulmonary embolus, right (HCC)   CKD (chronic kidney disease), stage I    Multiple bilateral PE with right heart strain  -Most likely cause would be Exemestane (Aromasin), vs recurrence of one of her cancers -7/10 s/p  bilateral pulmonary angiography and EKOS US-assisted pulmonary thrombolytic therapy  -Titrate O2 to maintain sats > 92% -IR following. tPA infusion x 12 hours.  -Eliquis per pharmacy. Counseled will need to remain on anticoagulation minimum of 3-6 months. Will follow-up with her Oncologist to determine if lifelong treatment vs stopping Exemestane (Aromasin) -SATURATION QUALIFICATIONS: (This note is used to comply with regulatory documentation for home oxygen) Patient Saturations on Room Air at Rest = 95 Patient Saturations on Room Air while Ambulating = 90 Patient Saturations on 0 Liters of oxygen while Ambulating = 90 @_0  does not qualify for home O2. Currently doing well on room air. -Hypercoagulability workup per oncology -Will likely need outpatient sleep study  Bilateral lower extremity acute DVT -Eliquis per pharmacy -See pulmonary emboli  HTN- stable -Continue Metoprolol 13m bid  HLD Continue Fenofibrate 1666mdaily  CKD stage 1- stable after contrast Lab Results  Component Value Date   CREATININE 0.97 12/06/2015   CREATININE 0.95 12/05/2015   CREATININE 0.96 12/04/2015  -resolved  Type II DM- last A1c 6.2% in 2011 -7/13 Hemoglobin A1C= 6.8 -Sensitive SSI  -CKD has resolved will restart patient's home Metformin 500 mg BID -Would like to also speak with dietitian concerning carb modified diet for home use. Consult pending   DVT prophylaxis: Eliquis  Code Status: Full Family Communication: None Disposition Plan: [facility] as soon as insurance clears.   Consultants:  Dr. MaRolm Bookbinders Oncologist     Procedures/Significant Events:  11/29/15 CXR: No acute abnormalities 11/29/15 CTA: There is a positive large burden of bilateral pulmonary emboli. 12/01/15 ECHO: EF  55-60%, LA mildly dilated, RV mildly dilated with moderately reduce systolic function, RA moderately dilated, PA peak pressure 41mHg 7/10: Bilateral pulmonary angiography and EKOS US-assisted  pulmonary thrombolytic therapy 7/11 bilateral lower extremity Doppler;Bilateral lower extremities are positive for acute deep vein thrombosis involving the right femoral, right profunda femoral, right popliteal, and left femoral veins..    Cultures   Antimicrobials:    Devices    LINES / TUBES:      Continuous Infusions:    Subjective: 7/16 A/O 4, NAD. Sitting in chair comfortably. As discussed going to [facility] with CSW BSeaman Paperwork will be faxed over to insurance for clearance on Monday.    Objective: Filed Vitals:   12/06/15 2340 12/07/15 0404 12/07/15 0755 12/07/15 1000  BP: 116/64 122/65 112/68 112/73  Pulse: 97 92 88 89  Temp: 98.6 F (37 C) 98.6 F (37 C) 98.9 F (37.2 C)   TempSrc: Oral Oral Oral   Resp: _0 Height:      Weight:      SpO2: 94% 97% 90% 93%    Intake/Output Summary (Last 24 hours) at 12/07/15 1111 Last data filed at 12/07/15 0951  Gross per 24 hour  Intake    720 ml  Output   1775 ml  Net  -1055 ml   Filed Weights   12/03/15 0600 12/04/15 0400 12/04/15 1857  Weight: 131.2 kg (289 lb 3.9 oz) 131.7 kg (290 lb 5.5 oz) 126.055 kg (277 lb 14.4 oz)    Examination:  General: A/O 4, Negative acute respiratory distress  Eyes: negative scleral hemorrhage, negative anisocoria, negative icterus ENT: Negative Runny nose, negative gingival bleeding, Neck:  Negative scars, masses, torticollis, lymphadenopathy, JVD Lungs: Clear to auscultation bilaterally without wheezes or crackles Cardiovascular: Regular rate and rhythm without murmur gallop or rub normal S1 and S2 Abdomen: Morbidly obese, abdominal pain, nondistended, positive soft, bowel sounds, no rebound, no ascites, no appreciable mass Extremities: No significant cyanosis, clubbing, or edema bilateral lower extremities Skin: Negative rashes, lesions, ulcers Psychiatric:  Negative depression, negative anxiety, negative fatigue, negative mania  Central nervous system:   Cranial nerves II through XII intact, tongue/uvula midline, all extremities muscle strength 5/5, sensation intact throughout,  negative dysarthria, negative expressive aphasia, negative receptive aphasia.  .     Data Reviewed: Care during the described time interval was provided by me .  I have reviewed this patient's available data, including medical history, events of note, physical examination, and all test results as part of my evaluation. I have personally reviewed and interpreted all radiology studies.  CBC:  Recent Labs Lab 12/02/15 0340 12/02/15 0945 12/02/15 1600 12/03/15 0331 12/06/15 0350  WBC 8.0 7.3 7.6 7.3 6.1  HGB 12.6 12.2 12.1 11.8* 11.8*  HCT 40.9 40.3 38.9 37.4 38.4  MCV 87.4 87.4 86.6 86.4 87.7  PLT 124* 123* 123* 117* 1989  Basic Metabolic Panel:  Recent Labs Lab 12/01/15 1534 12/02/15 0340 12/04/15 0440 12/05/15 0257 12/06/15 0350  NA 139 139 137 139 140  K 4.4 4.3 4.1 4.4 4.4  CL 108 108 105 103 106  CO2 _1 GLUCOSE 132* 127* 120* 123* 114*  BUN _2 CREATININE 1.33* 1.24* 0.96 0.95 0.97  CALCIUM 9.3 9.3 9.0 9.4 9.5  MG  --   --   --   --  2.1   GFR: Estimated Creatinine Clearance: 70.1 mL/min (by C-G formula based on Cr of 0.97).  Liver Function Tests: No results for input(s): AST, ALT, ALKPHOS, BILITOT, PROT, ALBUMIN in the last 168 hours. No results for input(s): LIPASE, AMYLASE in the last 168 hours. No results for input(s): AMMONIA in the last 168 hours. Coagulation Profile: No results for input(s): INR, PROTIME in the last 168 hours. Cardiac Enzymes:  Recent Labs Lab 12/01/15 1534  TROPONINI 0.03*   BNP (last 3 results) No results for input(s): PROBNP in the last 8760 hours. HbA1C: No results for input(s): HGBA1C in the last 72 hours. CBG:  Recent Labs Lab 12/06/15 0505 12/06/15 0802 12/06/15 1245 12/06/15 1602 12/07/15 0756  GLUCAP 132* 104* 152* 121* 107*   Lipid Profile: No results for  input(s): CHOL, HDL, LDLCALC, TRIG, CHOLHDL, LDLDIRECT in the last 72 hours. Thyroid Function Tests: No results for input(s): TSH, T4TOTAL, FREET4, T3FREE, THYROIDAB in the last 72 hours. Anemia Panel: No results for input(s): VITAMINB12, FOLATE, FERRITIN, TIBC, IRON, RETICCTPCT in the last 72 hours. Urine analysis:    Component Value Date/Time   COLORURINE AMBER BIOCHEMICALS MAY BE AFFECTED BY COLOR* 03/31/2010 1116   APPEARANCEUR CLOUDY* 03/31/2010 1116   LABSPEC 1.031* 03/31/2010 1116   PHURINE 5.5 03/31/2010 1116   GLUCOSEU NEGATIVE 03/31/2010 1116   HGBUR NEGATIVE 03/31/2010 1116   [place] 03/31/2010 1116   KETONESUR NEGATIVE 03/31/2010 1116   PROTEINUR NEGATIVE 03/31/2010 1116   UROBILINOGEN 0.2 03/31/2010 1116   NITRITE NEGATIVE 03/31/2010 1116   LEUKOCYTESUR  03/31/2010 1116    NEGATIVE MICROSCOPIC NOT DONE ON URINES WITH NEGATIVE PROTEIN, BLOOD, LEUKOCYTES, NITRITE, OR GLUCOSE <1000 mg/dL.   Sepsis Labs: _0 (procalcitonin:4,lacticidven:4)  ) Recent Results (from the past 240 hour(s))  MRSA PCR Screening     Status: None   Collection Time: 11/29/15  9:23 PM  Result Value Ref Range Status   MRSA by PCR NEGATIVE NEGATIVE Final    Comment:        The GeneXpert MRSA Assay (FDA approved for NASAL specimens only), is one component of a comprehensive MRSA colonization surveillance program. It is not intended to diagnose MRSA infection nor to guide or monitor treatment for MRSA infections.          Radiology Studies: No results found.      Scheduled Meds: . apixaban  10 mg Oral BID   Followed by  . [START ON 12/11/2015] apixaban  5 mg Oral BID  . buPROPion  150 mg Oral BID  . carisoprodol  350 mg Oral Q6H  . cycloSPORINE  1 drop Both Eyes BID  . diclofenac sodium  1 application Topical QID  . DULoxetine  20 mg Oral Daily  . exemestane  25 mg Oral Daily  . fenofibrate  160 mg Oral Daily  . gabapentin  300 mg Oral QHS  . insulin  aspart  0-9 Units Subcutaneous TID WC & HS  . metFORMIN  500 mg Oral BID WC  . metoprolol succinate  50 mg Oral BID   Continuous Infusions:    LOS: 7 days    Time spent: 40 minutes    [name], Geraldo Docker, MD [facility] Pager [phone]   If 7PM-7AM, please contact night-coverage [email or web address] Password [email or web address] 12/07/2015, 11:11 AM

## 2015-12-08 LAB — BASIC METABOLIC PANEL
Anion gap: 4 — ABNORMAL LOW (ref 5–15)
BUN: 15 mg/dL (ref 6–20)
CHLORIDE: 105 mmol/L (ref 101–111)
CO2: 28 mmol/L (ref 22–32)
CREATININE: 1.07 mg/dL — AB (ref 0.44–1.00)
Calcium: 9.4 mg/dL (ref 8.9–10.3)
GFR calc Af Amer: 58 mL/min — ABNORMAL LOW (ref >60)
GFR, EST NON AFRICAN AMERICAN: 50 mL/min — AB (ref >60)
Glucose, Bld: 135 mg/dL — ABNORMAL HIGH (ref 65–99)
Potassium: 4.2 mmol/L (ref 3.5–5.1)
SODIUM: 137 mmol/L (ref 135–145)

## 2015-12-08 LAB — GLUCOSE, CAPILLARY
GLUCOSE-CAPILLARY: 115 mg/dL — AB (ref 65–99)
Glucose-Capillary: 116 mg/dL — ABNORMAL HIGH (ref 65–99)

## 2015-12-08 LAB — MAGNESIUM: MAGNESIUM: 2.3 mg/dL (ref 1.7–2.4)

## 2015-12-08 MED ORDER — INSULIN ASPART 100 UNIT/ML ~~LOC~~ SOLN: 0.0000 [IU] | Freq: Two times a day (BID) | SUBCUTANEOUS | Status: DC

## 2015-12-08 MED ORDER — METFORMIN HCL 500 MG PO TABS
500.0000 mg | ORAL_TABLET | Freq: Two times a day (BID) | ORAL | Status: DC
  Administered 2015-12-08 – 2015-12-09 (×3): 500 mg via ORAL
  Filled 2015-12-08 (×3): qty 1

## 2015-12-08 NOTE — Clinical Social Work Note (Signed)
CSW spoke to patient and her son to discuss SNF options.  Patient wants to go to [facility] for short term rehab, CSW contacted insurance company which is [employer] [place], which they need the SNF to send clinicals to [employer] for preauthorization.  CSW spoke to [facility] who is working on getting patient approved for short term rehab.  SNF will contact CSW once insurance approval has been received.  Jones Broom. [place], MSW, [place] 12/08/2015 1:11 PM

## 2015-12-08 NOTE — Plan of Care (Signed)
Problem: Food- and Nutrition-Related Knowledge Deficit (NB-1.1) Goal: Nutrition education Formal process to instruct or train a patient/client in a skill or to impart knowledge to help patients/clients voluntarily manage or modify food choices and eating behavior to maintain or improve health. Outcome: Completed/Met Date Met:  12/08/15  RD consulted for nutrition education regarding diabetes.     Lab Results  Component Value Date    HGBA1C 6.8* 12/04/2015    RD provided "Carbohydrate Counting for People with Diabetes" handout from the Academy of Nutrition and Dietetics. Discussed different food groups and their effects on blood sugar, emphasizing carbohydrate-containing foods. Provided list of carbohydrates and recommended serving sizes of common foods.  Discussed importance of controlled and consistent carbohydrate intake throughout the day. Provided examples of ways to balance meals/snacks and encouraged intake of high-fiber, whole grain complex carbohydrates. Teach back method used.  Expect fair compliance.  Body mass index is 44.88 kg/(m^2). Pt meets criteria for obese class III based on current BMI.  Current diet order is card mod, patient is consuming approximately 100% of meals at this time. Labs and medications reviewed. No further nutrition interventions warranted at this time. RD contact information provided. If additional nutrition issues arise, please re-consult RD.   M. , MS, RD LDN Inpatient Clinical Dietitian Pager [phone]         

## 2015-12-08 NOTE — Care Management Important Message (Signed)
Important Message  Patient Details  Name: [name] MRN: [id number] Date of Birth: [birth year]   Medicare Important Message Given:  Yes    Loann Quill 12/08/2015, 3:09 PM

## 2015-12-08 NOTE — Progress Notes (Signed)
Physical Therapy Treatment Patient Details Name: [name] MRN: [id number] DOB: 1940-11-11 Today's Date: 12/08/2015    History of Present Illness Patient is a 75 y.o. BF PMHx Depression, HTN, HLD, Uterine cancer, Endometrial Ca S/P Hysterectomy and BSO at [facility], Left Breast Cainvasive ductal carcinoma who presents with SOB, cough, pain and weakness, positive for bilateral PE.    PT Comments    Pt admits to fatigue today, but agreeable to ambulate and used 3-in-1 while up.  Continue to feel pt would benefit from SNF level of care at D/C to maximize independence prior to returning to home.    Follow Up Recommendations  SNF     Equipment Recommendations  None recommended by PT    Recommendations for Other Services       Precautions / Restrictions Precautions Precautions: Fall Restrictions Weight Bearing Restrictions: No    Mobility  Bed Mobility Overal bed mobility: Needs Assistance Bed Mobility: Supine to Sit;Sit to Supine     Supine to sit: Min assist;HOB elevated Sit to supine: Mod assist   General bed mobility comments: A with scooting hips to EOB upon coming to sitting and A with Bil LEs with return to bed.    Transfers Overall transfer level: Needs assistance Equipment used: Rolling walker (2 wheeled) Transfers: Sit to/from [facility] Sit to Stand: Min guard;Min assist Stand pivot transfers: Min guard       General transfer comment: pt uses momentum and needs MinA from bed, but only MinG from 3-in-1 with bil armrests.    Ambulation/Gait Ambulation/Gait assistance: Min guard Ambulation Distance (Feet): 100 Feet Assistive device: Rolling walker (2 wheeled) Gait Pattern/deviations: Step-through pattern;Decreased stride length;Trunk flexed     General Gait Details: pt moves slowly and with very effortful gait.  pt remains with very flexed posture despite cues.  pt currentlyon RA and with DOE.     Stairs             Wheelchair Mobility    Modified Rankin (Stroke Patients Only)       Balance Overall balance assessment: Needs assistance Sitting-balance support: No upper extremity supported;Feet supported Sitting balance-Leahy Scale: Good     Standing balance support: Single extremity supported;Bilateral upper extremity supported;During functional activity Standing balance-Leahy Scale: Poor                      Cognition Arousal/Alertness: Awake/alert Behavior During Therapy: WFL for tasks assessed/performed Overall Cognitive Status: Within Functional Limits for tasks assessed                      Exercises      General Comments        Pertinent Vitals/Pain Pain Assessment: Faces Faces Pain Scale: Hurts a little bit Pain Location: pt indicates discomfort during mobility, but not any specific location. Pain Descriptors / Indicators: Discomfort Pain Intervention(s): Monitored during session;Repositioned    Home Living                      Prior Function            PT Goals (current goals can now be found in the care plan section) Acute Rehab PT Goals Patient Stated Goal: To return to independent PT Goal Formulation: With patient Time For Goal Achievement: 12/12/15 Potential to Achieve Goals: Good Progress towards PT goals: Progressing toward goals    Frequency  Min 3X/week    PT Plan Current plan  remains appropriate    Co-evaluation             End of Session Equipment Utilized During Treatment: Gait belt Activity Tolerance: Patient limited by fatigue Patient left: in bed;with call bell/phone within reach     Time: 0923-0949 PT Time Calculation (min) (ACUTE ONLY): 26 min  Charges:  $Gait Training: 8-22 mins $Therapeutic Activity: 8-22 mins                    G CodesCatarina Hartshorn, [place] 12/08/2015, 10:01 AM

## 2015-12-08 NOTE — Evaluation (Signed)
Occupational Therapy Evaluation Patient Details Name: [name] MRN: [id number] DOB: 1941/04/11 Today's Date: 12/08/2015    History of Present Illness Patient is a 75 y.o. BF PMHx Depression, HTN, HLD, Uterine cancer, Endometrial Ca S/P Hysterectomy and BSO at [facility], Left Breast Cainvasive ductal carcinoma who presents with SOB, cough, pain and weakness, positive for bilateral PE.   Clinical Impression   Pt with decline in function and safety with ADLs and ADL mobility with decreased strength, balance, endurance and B UE AROM. Pt would benefit from acute OT services to address impairments to increase level of function and safety    Follow Up Recommendations  SNF    Equipment Recommendations  Other (comment) (TBD at next venue of care)    Recommendations for Other Services       Precautions / Restrictions Precautions Precautions: Fall Restrictions Weight Bearing Restrictions: No      Mobility Bed Mobility Overal bed mobility: Needs Assistance Bed Mobility: Supine to Sit     Supine to sit: Min assist;HOB elevated        Transfers Overall transfer level: Needs assistance Equipment used: Rolling walker (2 wheeled) Transfers: Sit to/from Stand Sit to Stand: Min guard Stand pivot transfers: Min assist       General transfer comment: pt uses momentum and needs Min A from bed    Balance   Sitting-balance support: No upper extremity supported;Feet supported Sitting balance-Leahy Scale: Good     Standing balance support: Bilateral upper extremity supported;Single extremity supported;During functional activity Standing balance-Leahy Scale: Poor                              ADL Overall ADL's : Needs assistance/impaired     Grooming: Wash/dry hands;Wash/dry face;Sitting;Set up   Upper Body Bathing: Sitting;Minimal assitance   Lower Body Bathing: Maximal assistance   Upper Body Dressing : Minimal assistance;Sitting    Lower Body Dressing: Total assistance   Toilet Transfer: Min guard;RW;Ambulation;Minimal assistance   Toileting- Clothing Manipulation and Hygiene: Moderate assistance       Functional mobility during ADLs: Minimal assistance       Vision  reading glasses              Pertinent Vitals/Pain Pain Assessment: 0-10 Pain Score: 3  Pain Location: "all over" Pain Descriptors / Indicators: Discomfort Pain Intervention(s): Monitored during session;Repositioned     Hand Dominance Right   Extremity/Trunk Assessment Upper Extremity Assessment Upper Extremity Assessment: Generalized weakness;RUE deficits/detail;LUE deficits/detail RUE Deficits / Details: B shoulders limited in flexion and abduction. Pt states that at home, she has to donn her shirts "like a cat"           Communication Communication Communication: No difficulties   Cognition Arousal/Alertness: Awake/alert Behavior During Therapy: WFL for tasks assessed/performed Overall Cognitive Status: Within Functional Limits for tasks assessed                     General Comments   pt pleasant and cooperative    Exercises   Other Exercises Other Exercises: Pt asking about shoulder exercises to "loosen up". OT instructed pt on ROM exercises using towel on tabletop and cuppping hands for B flexion   Shoulder Instructions      Home Living Family/patient expects to be discharged to:: Skilled nursing facility Living Arrangements: Alone Available Help at Discharge: Family Type of Home: House Home Access: Stairs to enter;Ramped entrance   Entrance  Stairs-Rails: Right Home Layout: One level     Bathroom Shower/Tub: Tub/shower unit         Home Equipment: Walker - 2 wheels;Adaptive equipment Adaptive Equipment: Reacher Additional Comments: pt states that she has assist with bathing and dressing 3x/wk      Prior Functioning/Environment Level of Independence: Independent with assistive device(s)         Comments: son from [place] here, but won't have assist, open to rehab and has been to [facility] in the past.     OT Diagnosis: Generalized weakness   OT Problem List: Decreased strength;Decreased knowledge of use of DME or AE;Decreased activity tolerance;Impaired balance (sitting and/or standing)   OT Treatment/Interventions: Self-care/ADL training;Patient/family education;Therapeutic activities;DME and/or AE instruction    OT Goals(Current goals can be found in the care plan section) Acute Rehab OT Goals Patient Stated Goal: To return to independent OT Goal Formulation: With patient Time For Goal Achievement: 12/15/15 Potential to Achieve Goals: Good ADL Goals Pt Will Perform Grooming: with min guard assist;with supervision;with set-up;standing Pt Will Perform Upper Body Bathing: with min guard assist;with supervision;with set-up;sitting Pt Will Perform Lower Body Bathing: with mod assist;sitting/lateral leans;sit to/from stand Pt Will Perform Upper Body Dressing: with min guard assist;with supervision;with set-up;sitting Pt Will Transfer to Toilet: with min guard assist;ambulating;grab bars (3 in 1 over toilet) Pt Will Perform Toileting - Clothing Manipulation and hygiene: with min assist;sit to/from stand  OT Frequency: Min 2X/week   Barriers to D/C: Decreased caregiver support                        End of Session Equipment Utilized During Treatment: Gait belt;Rolling walker Nurse Communication: Mobility status  Activity Tolerance: Patient tolerated treatment well Patient left: in chair;with call bell/phone within reach   Time: 1021-1055 OT Time Calculation (min): 34 min Charges:  OT General Charges $OT Visit: 1 Procedure OT Evaluation $OT Eval Moderate Complexity: 1 Procedure OT Treatments $Self Care/Home Management : 8-22 mins G-Codes:    Britt Bottom 12/08/2015, 1:17 PM

## 2015-12-08 NOTE — Progress Notes (Signed)
TEAM 1 - Stepdown/ICU TEAM  [name]  [id number] DOB: Apr 13, 1941 DOA: 11/29/2015 PCP: Salena Saner., MD   Brief Narrative:  75 y.o. 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 Symptoms started approximately 4 days prior with a dry cough. She followed up with her primary care doctor who recommended she come to the hospital to be evaluated.    Assessment & Plan:   Multiple bilateral PEs with right heart strain - Bilateral lower extremity acute DVTs -7/10 s/p bilateral pulmonary angiography and EKOS US-assisted pulmonary thrombolytic therapy  -Eliquis initiated - will need to remain on anticoagulation for an absolute minimum of 6-12 months - follow-up with her Oncologist to determine if lifelong treatment more appropriate, and to discuss wether or not to stop exemestane  -Hypercoagulability workup per Oncology once enough time has passed to make it safe to temporarily hold anticoag   HTN -controlled   HLD Continue Fenofibrate   CKD stage 1 crt is stable   Recent Labs Lab 12/02/15 0340 12/04/15 0440 12/05/15 0257 12/06/15 0350 12/08/15 0533  CREATININE 1.24* 0.96 0.95 0.97 1.07*   DMs -7/13 A1C 6.8  Morbid Obesity - Body mass index is 44.88 kg/(m^2).   DVT prophylaxis: Eliquis Code Status: Full Family Communication: None Disposition Plan: [id number] SNF as soon as insurance clears (?7/18)  Consultants:  Oncology PCCM  Procedures/Significant Events:  11/29/15 CTA: positive large burden of bilateral pulmonary emboli 12/01/15 ECHO: EF 55-60%, LA mildly dilated, RV mildly dilated with moderately reduce systolic function, RA moderately dilated, PA peak pressure 3mmHg 7/10: Bilateral pulmonary angiography and EKOS US-assisted pulmonary  thrombolytic therapy 7/11 bilateral lower extremity Doppler;Bilateral lower extremities are positive for acute deep vein thrombosis involving the right femoral, right profunda femoral, right popliteal, and left femoral veins..  Antimicrobials: none  Subjective: The patient has been out working with physical therapy today.  She has noted a dry cough when she exerts herself.  She denies hemoptysis.  She reports a good appetite.  She denies fevers chills or abdominal pain.   Objective: Filed Vitals:   12/07/15 1200 12/07/15 1437 12/07/15 2049 12/08/15 0622  BP: 113/67 120/67 130/57 113/58  Pulse: 88 88 91 79  Temp: 98.4 F (36.9 C) 98.5 F (36.9 C) 98.5 F (36.9 C) 97.9 F (36.6 C)  TempSrc: Oral Oral Oral   Resp: 18  19 19   Height:      Weight:      SpO2: 100% 91% 95% 94%    Intake/Output Summary (Last 24 hours) at 12/08/15 1657 Last data filed at 12/08/15 1414  Gross per 24 hour  Intake    820 ml  Output    450 ml  Net    370 ml   Filed Weights   12/03/15 0600 12/04/15 0400 12/04/15 1857  Weight: 131.2 kg (289 lb 3.9 oz) 131.7 kg (290 lb 5.5 oz) 126.055 kg (277 lb 14.4 oz)    Examination: General: No acute respiratory distress  Lungs: Clear to auscultation bilaterally without wheezes or crackles - breath sounds distant Cardiovascular: Regular rate and rhythm Abdomen: Nontender, obese, soft, bowel sounds positive, no rebound, no ascites, no appreciable mass Extremities: 1+ B LE edema    CBC:  Recent Labs Lab 12/02/15 0340 12/02/15 0945 12/02/15 1600 12/03/15 0331 12/06/15 0350  WBC 8.0  7.3 7.6 7.3 6.1  HGB 12.6 12.2 12.1 11.8* 11.8*  HCT 40.9 40.3 38.9 37.4 38.4  MCV 87.4 87.4 86.6 86.4 87.7  PLT 124* 123* 123* 117* [id number]   Basic Metabolic Panel:  Recent Labs Lab 12/02/15 0340 12/04/15 0440 12/05/15 0257 12/06/15 0350 12/08/15 0533  NA 139 137 139 140 137  K 4.3 4.1 4.4 4.4 4.2  CL 108 105 103 106 105  CO2 23 26 26 26 28   GLUCOSE 127* 120* 123*  114* 135*  BUN 12 11 11 13 15   CREATININE 1.24* 0.96 0.95 0.97 1.07*  CALCIUM 9.3 9.0 9.4 9.5 9.4  MG  --   --   --  2.1 2.3   GFR: Estimated Creatinine Clearance: 63.6 mL/min (by C-G formula based on Cr of 1.07).   Recent Labs Lab 12/06/15 1245 12/06/15 1602 12/07/15 0756 12/07/15 1746 12/08/15 0815  GLUCAP 152* 121* 107* 139* 116*    Recent Results (from the past 240 hour(s))  MRSA PCR Screening     Status: None   Collection Time: 11/29/15  9:23 PM  Result Value Ref Range Status   MRSA by PCR NEGATIVE NEGATIVE Final    Comment:        The GeneXpert MRSA Assay (FDA approved for NASAL specimens only), is one component of a comprehensive MRSA colonization surveillance program. It is not intended to diagnose MRSA infection nor to guide or monitor treatment for MRSA infections.     Scheduled Meds: . apixaban  10 mg Oral BID   Followed by  . [START ON 12/11/2015] apixaban  5 mg Oral BID  . buPROPion  150 mg Oral BID  . carisoprodol  350 mg Oral Q6H  . cycloSPORINE  1 drop Both Eyes BID  . diclofenac sodium  1 application Topical QID  . DULoxetine  20 mg Oral Daily  . exemestane  25 mg Oral Daily  . fenofibrate  160 mg Oral Daily  . gabapentin  300 mg Oral QHS  . insulin aspart  0-9 Units Subcutaneous BID WC  . metFORMIN  500 mg Oral BID WC  . metoprolol succinate  50 mg Oral BID     LOS: 8 days   Cherene Altes, MD [facility] Office  [phone] Pager - Text Page per Shea Evans as per below:  On-Call/Text Page:      [email or web address]      password [email or web address]  If 7PM-7AM, please contact night-coverage [email or web address] Password [email or web address] 12/08/2015, 5:10 PM

## 2015-12-09 DIAGNOSIS — I2602 Saddle embolus of pulmonary artery with acute cor pulmonale: Secondary | ICD-10-CM

## 2015-12-09 DIAGNOSIS — I2699 Other pulmonary embolism without acute cor pulmonale: Secondary | ICD-10-CM | POA: Diagnosis present

## 2015-12-09 LAB — GLUCOSE, CAPILLARY: GLUCOSE-CAPILLARY: 109 mg/dL — AB (ref 65–99)

## 2015-12-09 LAB — CBC
HCT: 41.2 % (ref 36.0–46.0)
HEMOGLOBIN: 12.6 g/dL (ref 12.0–15.0)
MCH: 26.3 pg (ref 26.0–34.0)
MCHC: 30.6 g/dL (ref 30.0–36.0)
MCV: 86 fL (ref 78.0–100.0)
Platelets: 275 10*3/uL (ref 150–400)
RBC: 4.79 MIL/uL (ref 3.87–5.11)
RDW: 15.4 % (ref 11.5–15.5)
WBC: 5.7 10*3/uL (ref 4.0–10.5)

## 2015-12-09 MED ORDER — APIXABAN 5 MG PO TABS: 5.0000 mg | ORAL_TABLET | Freq: Two times a day (BID) | ORAL | Status: DC

## 2015-12-09 MED ORDER — APIXABAN 5 MG PO TABS: 10.0000 mg | ORAL_TABLET | Freq: Two times a day (BID) | ORAL | Status: DC

## 2015-12-09 MED ORDER — METFORMIN HCL 500 MG PO TABS: 500.0000 mg | ORAL_TABLET | Freq: Two times a day (BID) | ORAL | Status: AC

## 2015-12-09 MED ORDER — HYDROCODONE-ACETAMINOPHEN 5-325 MG PO TABS
1.0000 | ORAL_TABLET | Freq: Four times a day (QID) | ORAL | Status: DC | PRN
Start: 2015-12-09 — End: 2017-05-09

## 2015-12-09 NOTE — Discharge Planning (Signed)
Patient discharged to SNF in stable condition.  

## 2015-12-09 NOTE — Clinical Social Work Note (Addendum)
Patient to be d/c'ed today to [facility].  Patient and family agreeable to plans will transport via ems RN to call report to [phone] room [phone].  CSW notified patient and her son [name] who was at bedside that patient will be discharging to SNF.    Evette Cristal, MSW, [place]

## 2015-12-09 NOTE — Discharge Summary (Addendum)
Physician Discharge Summary  [name] NOB:[id number] DOB: [birth year] DOA: 11/29/2015  PCP: Salena Saner., MD  Admit date: 11/29/2015 Discharge date: 12/09/2015  Time spent: 40 minutes  Recommendations for Outpatient Follow-up:   Multiple bilateral PE with right heart strain  -Most likely cause would be Exemestane (Aromasin), vs recurrence of one of her cancers -7/10 s/p bilateral pulmonary angiography and EKOS US-assisted pulmonary thrombolytic therapy  -S/P tPA infusion x 12 hours.  -Eliquis per pharmacy. Counseled will need to remain on anticoagulation minimum of 3-6 months. Will follow-up with her Oncologist to determine if lifelong treatment vs stopping Exemestane (Aromasin) -SATURATION QUALIFICATIONS: (This note is used to comply with regulatory documentation for home oxygen) Patient Saturations on Room Air at Rest = 95 Patient Saturations on Room Air while Ambulating = 90 Patient Saturations on 0 Liters of oxygen while Ambulating = 90 @'@Patient'  does not qualify for home O2. Currently doing well on room air. -Hypercoagulability workup per oncology -Will likely need outpatient sleep study. PCP to coordinate obtaining sleep study  Bilateral lower extremity acute DVT -Eliquis per pharmacy -See pulmonary emboli -Schedule Follow-up with Dr. Rolm Bookbinder oncologist in 1-2 weeks to discuss PE/DVT and continuation of Aromasin  HTN- stable -Continue Metoprolol 60m bid -Schedule follow-up appointment with Dr. KWilley Bladein 2 weeks for hypertension, diabetes type 2 controlled, new DVT/PE  HLD Continue Fenofibrate 1673mdaily  CKD stage 1- stable after contrast Lab Results  Component Value Date   CREATININE 1.07* 12/08/2015   CREATININE 0.97 12/06/2015   CREATININE 0.95 12/05/2015    Type II DM Controlled, with complications -7/9/47emoglobin A1C= 6.8 -Sensitive SSI  -CKD has resolved will restart patient's home Metformin 500 mg BID -Spoke with Dietitian  concerning carb modified diet for home use.      Discharge Diagnoses:  Principal Problem:   Pulmonary embolism, bilateral (HCC) Active Problems:   Breast cancer of upper-outer quadrant of left female breast ([place]  Acute kidney injury superimposed on chronic kidney disease ([facility]  Depression   Essential hypertension   Acute respiratory failure with hypoxia (HCC)   Diabetes mellitus type 2 in obese ([facility] [place]  Bilateral pulmonary embolism ([facility]  PE (pulmonary embolism)   Pulmonary embolus, left (HCC)   Pulmonary embolus, right (HCC)   CKD (chronic kidney disease), stage I   Pulmonary embolus ([facility]  Discharge Condition: Stable  Diet recommendation: Heart healthy/American diabetic Association  Filed Weights   12/03/15 0600 12/04/15 0400 12/04/15 1857  Weight: 131.2 kg (289 lb 3.9 oz) 131.7 kg (290 lb 5.5 oz) 126.055 kg (277 lb 14.4 oz)    History of present illness:  [age].o. 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 S/P Lumpectomy /XRT last dose 04/2013 +Arimidex last dose 07/24/13  Who presents with complaints of shortness of breath. Symptoms started approximately 4 days ago with a dry cough. Thereafter 2 days later she noticed that she was having progressively worsening shortness of breath. Associated symptoms included lightheadedness, decreased oral intake, and aches and pains all over. She did not try anything particular to alleviate symptoms, but shortness of breath symptoms worsened with any kind of exertion. When she went to physical therapy that day the physical therapist stated that she did not appear to be quite her normal self. She followed up with her primary care doctor  who recommended her to come be evaluated. Patient denies any recent travel, being sedentary, leg swelling, or calf pain. She has a  history of breast cancer for which she is currently on exemastane.  During his hospitalization patient was treated for multiple bilateral PEs with right heart strain, and bilateral lower extremity DVT. These clots were most likely caused by patient's Aromasin which she started~12 months ago. Patient was started on heparin and then transitioned to Eliquis which she has tolerated well. Owing to her multiple cancers, obesity, and deconditioning will discharge to SNF for rehabilitation. Patient and son aware will need to discuss risks of continuing Aromasin with her oncologist.      Consultants:  Dr. Rolm Bookbinder is Oncologist     Procedures/Significant Events:  11/29/15 CXR: No acute abnormalities 11/29/15 CTA: There is a positive large burden of bilateral pulmonary emboli. 12/01/15 ECHO: EF 55-60%, LA mildly dilated, RV mildly dilated with moderately reduce systolic function, RA moderately dilated, PA peak pressure 60mHg 7/10: Bilateral pulmonary angiography and EKOS US-assisted pulmonary thrombolytic therapy 7/11 bilateral lower extremity Doppler;Bilateral lower extremities are positive for acute deep vein thrombosis involving the right femoral, right profunda femoral, right popliteal, and left femoral veins..    Discharge Exam: Filed Vitals:   12/08/15 1625 12/08/15 2121 12/08/15 2127 12/09/15 0555  BP: 118/69 136/67 136/67 116/60  Pulse: 95 90 90 83  Temp: 98.1 F (36.7 C) 98.7 F (37.1 C)  98.3 F (36.8 C)  TempSrc: Oral Oral  Oral  Resp: 18     Height:      Weight:      SpO2: 95% 92%  95%     General: A/O 4, Negative acute respiratory distress  Eyes: negative scleral hemorrhage, negative anisocoria, negative icterus ENT: Negative Runny nose, negative gingival bleeding, Neck: Negative scars, masses, torticollis, lymphadenopathy, JVD Lungs: Clear to auscultation bilaterally without wheezes or crackles Cardiovascular: Regular rate and rhythm without murmur gallop or  rub normal S1 and S2 Abdomen: Morbidly obese, abdominal pain, nondistended, positive soft, bowel sounds, no rebound, no ascites, no appreciable mass Extremities: No significant cyanosis, clubbing, or edema bilateral lower extremities    Discharge Instructions      Discharge Instructions    Amb Referral to Nutrition and Diabetic E    Complete by:  As directed             Medication List    STOP taking these medications        furosemide 20 MG tablet  Commonly known as:  LASIX      TAKE these medications        apixaban 5 MG Tabs tablet  Commonly known as:  ELIQUIS  Take 2 tablets (10 mg total) by mouth 2 (two) times daily. Last dose will be on July 27     apixaban 5 MG Tabs tablet  Commonly known as:  ELIQUIS  Take 1 tablet (5 mg total) by mouth 2 (two) times daily. Start on 28 July  Start taking on:  12/11/2015     buPROPion 150 MG 12 hr tablet  Commonly known as:  WELLBUTRIN SR  Take 150 mg by mouth 2 (two) times daily.     carisoprodol 350 MG tablet  Commonly known as:  SOMA  Take 350 mg by mouth every 6 (six) hours.     DULoxetine 20 MG capsule  Commonly known as:  CYMBALTA  Take 1 capsule (20 mg total) by mouth daily.     exemestane 25 MG  tablet  Commonly known as:  AROMASIN  Take 1 tablet (25 mg total) by mouth daily.     fenofibrate 160 MG tablet  Take 160 mg by mouth daily.     FIBER COMPLETE Tabs  Take 1 tablet by mouth daily.     gabapentin 300 MG capsule  Commonly known as:  NEURONTIN  Take 1 capsule (300 mg total) by mouth at bedtime.     HYDROcodone-acetaminophen 5-325 MG tablet  Commonly known as:  NORCO/VICODIN  Take 1 tablet by mouth every 6 (six) hours as needed for moderate pain.     ICY HOT 10-30 % Stck  Apply 1 application topically daily as needed (for pain).     metFORMIN 500 MG tablet  Commonly known as:  GLUCOPHAGE  Take 1 tablet (500 mg total) by mouth 2 (two) times daily with a meal.     metoprolol succinate 50 MG 24 hr  tablet  Commonly known as:  TOPROL-XL  Take 50 mg by mouth 2 (two) times daily.     multivitamin with minerals Tabs tablet  Take 1 tablet by mouth daily.     PENNSAID 1.5 % Soln  Generic drug:  Diclofenac Sodium  Place 10 drops onto the skin 4 (four) times daily. For pain     RESTASIS 0.05 % ophthalmic emulsion  Generic drug:  cycloSPORINE  Place 1 drop into both eyes 2 (two) times daily.       Allergies  Allergen Reactions  . Iodine Rash   Follow-up Information    Schedule an appointment as soon as possible for a visit with Rolm Bookbinder, MD.   Specialty:  General Surgery   Why:  Schedule Follow-up with Dr. Rolm Bookbinder oncologist in 1-2 weeks to discuss PE/DVT and continuation of Aromasin   Contact information:   [address] [place] [address] [phone]       Follow up with Salena Saner., MD. Schedule an appointment as soon as possible for a visit in 2 weeks.   Specialty:  Internal Medicine   Why:  Schedule follow-up appointment with Dr. Willey Blade in 2 weeks for hypertension, diabetes type 2 controlled, new DVT/PE   Contact information:   [address] [place] [address] [phone]        The results of significant diagnostics from this hospitalization (including imaging, microbiology, ancillary and laboratory) are listed below for reference.    Significant Diagnostic Studies: Dg Chest 2 View  11/29/2015  CLINICAL DATA:  Cough beginning Tuesday, progressive shortness of breath since yesterday, history hypertension, hyperlipidemia, uterine cancer, breast cancer EXAM: CHEST  2 VIEW COMPARISON:  01/05/2013 FINDINGS: Upper normal heart size. Atherosclerotic calcification aorta. Mediastinal contours and pulmonary vascularity normal. Respiratory motion artifacts degrade diagnostic utility of lateral view. Lungs clear. No pulmonary infiltrate, pleural effusion or pneumothorax. Endplate spur formation thoracic spine.  BILATERAL glenohumeral degenerative changes with degenerative disc disease changes thoracic spine. IMPRESSION: No acute abnormalities. Aortic atherosclerosis. Electronically Signed   By: Lavonia Dana M.D.   On: 11/29/2015 14:59   Ct Angio Chest Pe W/cm &/or Wo Cm  11/29/2015  CLINICAL DATA:  Cough and progressive shortness of breath. EXAM: CT ANGIOGRAPHY CHEST WITH CONTRAST TECHNIQUE: Multidetector CT imaging of the chest was performed using the standard protocol during bolus administration of intravenous contrast. Multiplanar CT image reconstructions and MIPs were obtained to evaluate the vascular anatomy. CONTRAST:  80 mL of Isovue 370 COMPARISON:  Chest x-ray from earlier today. CT  scan of the chest February 08, 2013 FINDINGS: The central airways are within normal limits. No pneumothorax identified. Scattered subsegmental atelectasis is identified. No pulmonary nodules, masses, or focal infiltrates are identified. Pulmonary emboli are identified. There is a large embolus in the left main pulmonary artery with emboli seen in the left upper and lower lobe branches. Pulmonary emboli are also seen on the right involving right upper, middle, and lower lobe branches. Embolus extends into the periphery of the right main pulmonary artery. The aorta and main pulmonary artery are similar in caliber measuring 3.7 cm. There is no definitive flattening of the intraventricular septum or deviation to the left. The right and left ventricles are similar in size. There is no reflux of contrast into the inferior vena cava. No effusions or adenopathy. Evaluation of the upper abdomen is limited due to arterial phase imaging with no acute abnormalities seen. Visualized bones demonstrate degenerative changes in the spine but no other abnormalities. Review of the MIP images confirms the above findings. IMPRESSION: 1. There is a large burden of bilateral pulmonary emboli. Findings called to Dr. Daleen Bo Electronically Signed   By:  Dorise Bullion III M.D   On: 11/29/2015 19:24   Ir Angiogram Pulmonary Bilateral Selective  12/02/2015  INDICATION: Submassive bilateral pulmonary embolism with lack of clinical improvement on IV heparin and echocardiography evidence of right heart strain. EXAM: IR INFUSION THROMBOL ARTERIAL INITIAL (MS); ADDITIONAL ARTERIOGRAPHY; BILATERAL PULMONARY ARTERIOGRAPHY; IR ULTRASOUND GUIDANCE VASC ACCESS RIGHT 1. ULTRASOUND GUIDANCE FOR VASCULAR ACCESS OF THE RIGHT COMMON FEMORAL VEIN X 2 2. SECOND ORDER SELECTIVE LEFT-SIDED PULMONARY ARTERIOGRAPHY 3. SECOND ORDER SELECTIVE RIGHT-SIDED PULMONARY ARTERIOGRAPHY 4. THIRD ORDER RIGHT INFERIOR PULMONARY ARTERIOGRAPHY 5. THIRD ORDER RIGHT SUPERIOR PULMONARY ARTERIOGRAPHY 6. TRANSCATHETER ARTERIAL THROMBOLYSIS FOR TREATMENT OF BILATERAL PULMONARY EMBOLISM MEDICATIONS: None ANESTHESIA/SEDATION: Moderate (conscious) sedation was employed during this procedure. A total of Versed 2.0 mg and Fentanyl 100 mcg was administered intravenously. Moderate Sedation Time: 60 minutes. The patient's level of consciousness and vital signs were monitored continuously by radiology nursing throughout the procedure under my direct supervision. CONTRAST:  50 mL Isovue-300 FLUOROSCOPY TIME:  Fluoroscopy Time: 20 minutes and 24 seconds. COMPLICATIONS: None. PROCEDURE: Informed consent was obtained from the patient following explanation of the procedure, risks, benefits and alternatives. The patient understands, agrees and consents for the procedure. All questions were addressed. A time out was performed prior to the initiation of the procedure. Maximal barrier sterile technique utilized including caps, mask, sterile gowns, sterile gloves, large sterile drape, hand hygiene, and chlorhexidine prep. Ultrasound was used to confirm patency of the right common femoral vein. Under direct ultrasound guidance, 2 separate micropuncture sets were utilized in obtaining access of the right common femoral vein.  At both access sites, 6 [place] vascular sheaths were placed. A 5 [place] JB 1 catheter was advanced through one of the femoral sheaths. This was used to selectively catheterize the main pulmonary outflow tract. Pulmonary artery pressure measurement was obtained in the main pulmonary artery. The catheter was further advanced into the left pulmonary artery and selective arteriography performed. The catheter was further advanced into a lower lobe pulmonary artery branch over a hydrophilic guidewire. The catheter was removed over a Rosen wire. A 5 French H1 catheter was advanced through the second femoral venous sheath and used to catheterize the right pulmonary artery. Arteriography was performed and pressure measurements obtained. Further selective catheterization of both lower lobes supplying and upper lobe supply was performed through the H1 catheter. Ultimately, the  catheter was removed over a Rosen wire with the catheter advanced into an inferior right upper lobe branch. Over the right pulmonary artery Rosen wire, an 18 cm infusion length EKOS ultrasound assisted thrombolytic catheter was advanced. The guidewire was removed and the coaxial ultrasound infusion catheter was advanced. Over the left pulmonary artery Rosen wire, a 12 cm infusion length EKOS catheter was advanced. The guidewire was removed and the coaxial ultrasound infusion catheter advanced through the catheter. Femoral venous sheaths were secured at the right groin with Ethilon retention sutures. Overlying dressings were applied. Thrombolytic infusion of tPA was begun via the EKOS system at a dose of 1 milligram/hour via each pulmonary arterial infusion catheter. Infusion will be continued overnight for 12 hours and pulmonary artery pressures re- checked in the morning. FINDINGS: The measured pulmonary artery pressures are severely elevated. Main pulmonary artery:   63/31 (mean 44) mm Hg Left pulmonary artery:    67/53 (mean 61) mm Hg Right pulmonary  artery:  68/32 (mean 47) mm Hg Selective arteriography of the left pulmonary artery demonstrates significant thrombus burden nearly occluding lower lobe flow and also with nonocclusive thrombus visualized and upper lobe pulmonary artery branches. Right-sided pulmonary arterial anatomy was difficult to catheterize due to descending angulation of the right pulmonary artery with relatively early pulmonary arterial bifurcation. Initial inferior pulmonary arterial catheterization with selective arteriography demonstrates nonocclusive thrombus in a lower lobe pulmonary arterial branch. During the procedure the catheter retracted into upper lobe supply with selective arteriography demonstrated nonocclusive thrombus in upper lobe branches. EKOS catheters were positioned with the left sided catheter extending into a lower lobe pulmonary artery branch and the right-sided catheter lying transversely and terminating in an inferior upper lobe branch. IMPRESSION: Pulmonary arterial thrombolytic therapy begun bilaterally for significant bilateral pulmonary embolism. EKOS ultrasound assisted thrombolytic therapy was begun at a dose of 1 milligram/hour of tPA. This will be continued overnight for 12 hours of total infusion prior to rechecking pulmonary artery pressures. Arteriography demonstrates significant bilateral pulmonary arterial thrombus burden, slightly greater on the left. Measured pulmonary arterial pressures are also severely elevated. Electronically Signed   By: Aletta Edouard M.D.   On: 12/02/2015 09:12   Ir Angiogram Selective Each Additional Vessel  12/02/2015  INDICATION: Submassive bilateral pulmonary embolism with lack of clinical improvement on IV heparin and echocardiography evidence of right heart strain. EXAM: IR INFUSION THROMBOL ARTERIAL INITIAL (MS); ADDITIONAL ARTERIOGRAPHY; BILATERAL PULMONARY ARTERIOGRAPHY; IR ULTRASOUND GUIDANCE VASC ACCESS RIGHT 1. ULTRASOUND GUIDANCE FOR VASCULAR ACCESS OF THE  RIGHT COMMON FEMORAL VEIN X 2 2. SECOND ORDER SELECTIVE LEFT-SIDED PULMONARY ARTERIOGRAPHY 3. SECOND ORDER SELECTIVE RIGHT-SIDED PULMONARY ARTERIOGRAPHY 4. THIRD ORDER RIGHT INFERIOR PULMONARY ARTERIOGRAPHY 5. THIRD ORDER RIGHT SUPERIOR PULMONARY ARTERIOGRAPHY 6. TRANSCATHETER ARTERIAL THROMBOLYSIS FOR TREATMENT OF BILATERAL PULMONARY EMBOLISM MEDICATIONS: None ANESTHESIA/SEDATION: Moderate (conscious) sedation was employed during this procedure. A total of Versed 2.0 mg and Fentanyl 100 mcg was administered intravenously. Moderate Sedation Time: 60 minutes. The patient's level of consciousness and vital signs were monitored continuously by radiology nursing throughout the procedure under my direct supervision. CONTRAST:  50 mL Isovue-300 FLUOROSCOPY TIME:  Fluoroscopy Time: 20 minutes and 24 seconds. COMPLICATIONS: None. PROCEDURE: Informed consent was obtained from the patient following explanation of the procedure, risks, benefits and alternatives. The patient understands, agrees and consents for the procedure. All questions were addressed. A time out was performed prior to the initiation of the procedure. Maximal barrier sterile technique utilized including caps, mask, sterile gowns, sterile gloves, large sterile drape,  hand hygiene, and chlorhexidine prep. Ultrasound was used to confirm patency of the right common femoral vein. Under direct ultrasound guidance, 2 separate micropuncture sets were utilized in obtaining access of the right common femoral vein. At both access sites, 6 [place] vascular sheaths were placed. A 5 [place] JB 1 catheter was advanced through one of the femoral sheaths. This was used to selectively catheterize the main pulmonary outflow tract. Pulmonary artery pressure measurement was obtained in the main pulmonary artery. The catheter was further advanced into the left pulmonary artery and selective arteriography performed. The catheter was further advanced into a lower lobe pulmonary  artery branch over a hydrophilic guidewire. The catheter was removed over a Rosen wire. A 5 French H1 catheter was advanced through the second femoral venous sheath and used to catheterize the right pulmonary artery. Arteriography was performed and pressure measurements obtained. Further selective catheterization of both lower lobes supplying and upper lobe supply was performed through the H1 catheter. Ultimately, the catheter was removed over a Rosen wire with the catheter advanced into an inferior right upper lobe branch. Over the right pulmonary artery Rosen wire, an 18 cm infusion length EKOS ultrasound assisted thrombolytic catheter was advanced. The guidewire was removed and the coaxial ultrasound infusion catheter was advanced. Over the left pulmonary artery Rosen wire, a 12 cm infusion length EKOS catheter was advanced. The guidewire was removed and the coaxial ultrasound infusion catheter advanced through the catheter. Femoral venous sheaths were secured at the right groin with Ethilon retention sutures. Overlying dressings were applied. Thrombolytic infusion of tPA was begun via the EKOS system at a dose of 1 milligram/hour via each pulmonary arterial infusion catheter. Infusion will be continued overnight for 12 hours and pulmonary artery pressures re- checked in the morning. FINDINGS: The measured pulmonary artery pressures are severely elevated. Main pulmonary artery:   63/31 (mean 44) mm Hg Left pulmonary artery:    67/53 (mean 61) mm Hg Right pulmonary artery:  68/32 (mean 47) mm Hg Selective arteriography of the left pulmonary artery demonstrates significant thrombus burden nearly occluding lower lobe flow and also with nonocclusive thrombus visualized and upper lobe pulmonary artery branches. Right-sided pulmonary arterial anatomy was difficult to catheterize due to descending angulation of the right pulmonary artery with relatively early pulmonary arterial bifurcation. Initial inferior pulmonary  arterial catheterization with selective arteriography demonstrates nonocclusive thrombus in a lower lobe pulmonary arterial branch. During the procedure the catheter retracted into upper lobe supply with selective arteriography demonstrated nonocclusive thrombus in upper lobe branches. EKOS catheters were positioned with the left sided catheter extending into a lower lobe pulmonary artery branch and the right-sided catheter lying transversely and terminating in an inferior upper lobe branch. IMPRESSION: Pulmonary arterial thrombolytic therapy begun bilaterally for significant bilateral pulmonary embolism. EKOS ultrasound assisted thrombolytic therapy was begun at a dose of 1 milligram/hour of tPA. This will be continued overnight for 12 hours of total infusion prior to rechecking pulmonary artery pressures. Arteriography demonstrates significant bilateral pulmonary arterial thrombus burden, slightly greater on the left. Measured pulmonary arterial pressures are also severely elevated. Electronically Signed   By: Aletta Edouard M.D.   On: 12/02/2015 09:12   Ir Angiogram Selective Each Additional Vessel  12/02/2015  INDICATION: Submassive bilateral pulmonary embolism with lack of clinical improvement on IV heparin and echocardiography evidence of right heart strain. EXAM: IR INFUSION THROMBOL ARTERIAL INITIAL (MS); ADDITIONAL ARTERIOGRAPHY; BILATERAL PULMONARY ARTERIOGRAPHY; IR ULTRASOUND GUIDANCE VASC ACCESS RIGHT 1. ULTRASOUND GUIDANCE FOR VASCULAR ACCESS OF THE  RIGHT COMMON FEMORAL VEIN X 2 2. SECOND ORDER SELECTIVE LEFT-SIDED PULMONARY ARTERIOGRAPHY 3. SECOND ORDER SELECTIVE RIGHT-SIDED PULMONARY ARTERIOGRAPHY 4. THIRD ORDER RIGHT INFERIOR PULMONARY ARTERIOGRAPHY 5. THIRD ORDER RIGHT SUPERIOR PULMONARY ARTERIOGRAPHY 6. TRANSCATHETER ARTERIAL THROMBOLYSIS FOR TREATMENT OF BILATERAL PULMONARY EMBOLISM MEDICATIONS: None ANESTHESIA/SEDATION: Moderate (conscious) sedation was employed during this procedure. A total  of Versed 2.0 mg and Fentanyl 100 mcg was administered intravenously. Moderate Sedation Time: 60 minutes. The patient's level of consciousness and vital signs were monitored continuously by radiology nursing throughout the procedure under my direct supervision. CONTRAST:  50 mL Isovue-300 FLUOROSCOPY TIME:  Fluoroscopy Time: 20 minutes and 24 seconds. COMPLICATIONS: None. PROCEDURE: Informed consent was obtained from the patient following explanation of the procedure, risks, benefits and alternatives. The patient understands, agrees and consents for the procedure. All questions were addressed. A time out was performed prior to the initiation of the procedure. Maximal barrier sterile technique utilized including caps, mask, sterile gowns, sterile gloves, large sterile drape, hand hygiene, and chlorhexidine prep. Ultrasound was used to confirm patency of the right common femoral vein. Under direct ultrasound guidance, 2 separate micropuncture sets were utilized in obtaining access of the right common femoral vein. At both access sites, 6 [place] vascular sheaths were placed. A 5 [place] JB 1 catheter was advanced through one of the femoral sheaths. This was used to selectively catheterize the main pulmonary outflow tract. Pulmonary artery pressure measurement was obtained in the main pulmonary artery. The catheter was further advanced into the left pulmonary artery and selective arteriography performed. The catheter was further advanced into a lower lobe pulmonary artery branch over a hydrophilic guidewire. The catheter was removed over a Rosen wire. A 5 French H1 catheter was advanced through the second femoral venous sheath and used to catheterize the right pulmonary artery. Arteriography was performed and pressure measurements obtained. Further selective catheterization of both lower lobes supplying and upper lobe supply was performed through the H1 catheter. Ultimately, the catheter was removed over a Rosen wire  with the catheter advanced into an inferior right upper lobe branch. Over the right pulmonary artery Rosen wire, an 18 cm infusion length EKOS ultrasound assisted thrombolytic catheter was advanced. The guidewire was removed and the coaxial ultrasound infusion catheter was advanced. Over the left pulmonary artery Rosen wire, a 12 cm infusion length EKOS catheter was advanced. The guidewire was removed and the coaxial ultrasound infusion catheter advanced through the catheter. Femoral venous sheaths were secured at the right groin with Ethilon retention sutures. Overlying dressings were applied. Thrombolytic infusion of tPA was begun via the EKOS system at a dose of 1 milligram/hour via each pulmonary arterial infusion catheter. Infusion will be continued overnight for 12 hours and pulmonary artery pressures re- checked in the morning. FINDINGS: The measured pulmonary artery pressures are severely elevated. Main pulmonary artery:   63/31 (mean 44) mm Hg Left pulmonary artery:    67/53 (mean 61) mm Hg Right pulmonary artery:  68/32 (mean 47) mm Hg Selective arteriography of the left pulmonary artery demonstrates significant thrombus burden nearly occluding lower lobe flow and also with nonocclusive thrombus visualized and upper lobe pulmonary artery branches. Right-sided pulmonary arterial anatomy was difficult to catheterize due to descending angulation of the right pulmonary artery with relatively early pulmonary arterial bifurcation. Initial inferior pulmonary arterial catheterization with selective arteriography demonstrates nonocclusive thrombus in a lower lobe pulmonary arterial branch. During the procedure the catheter retracted into upper lobe supply with selective arteriography demonstrated nonocclusive thrombus in upper lobe  branches. EKOS catheters were positioned with the left sided catheter extending into a lower lobe pulmonary artery branch and the right-sided catheter lying transversely and terminating  in an inferior upper lobe branch. IMPRESSION: Pulmonary arterial thrombolytic therapy begun bilaterally for significant bilateral pulmonary embolism. EKOS ultrasound assisted thrombolytic therapy was begun at a dose of 1 milligram/hour of tPA. This will be continued overnight for 12 hours of total infusion prior to rechecking pulmonary artery pressures. Arteriography demonstrates significant bilateral pulmonary arterial thrombus burden, slightly greater on the left. Measured pulmonary arterial pressures are also severely elevated. Electronically Signed   By: Aletta Edouard M.D.   On: 12/02/2015 09:12   Ir Angiogram Selective Each Additional Vessel  12/02/2015  INDICATION: Submassive bilateral pulmonary embolism with lack of clinical improvement on IV heparin and echocardiography evidence of right heart strain. EXAM: IR INFUSION THROMBOL ARTERIAL INITIAL (MS); ADDITIONAL ARTERIOGRAPHY; BILATERAL PULMONARY ARTERIOGRAPHY; IR ULTRASOUND GUIDANCE VASC ACCESS RIGHT 1. ULTRASOUND GUIDANCE FOR VASCULAR ACCESS OF THE RIGHT COMMON FEMORAL VEIN X 2 2. SECOND ORDER SELECTIVE LEFT-SIDED PULMONARY ARTERIOGRAPHY 3. SECOND ORDER SELECTIVE RIGHT-SIDED PULMONARY ARTERIOGRAPHY 4. THIRD ORDER RIGHT INFERIOR PULMONARY ARTERIOGRAPHY 5. THIRD ORDER RIGHT SUPERIOR PULMONARY ARTERIOGRAPHY 6. TRANSCATHETER ARTERIAL THROMBOLYSIS FOR TREATMENT OF BILATERAL PULMONARY EMBOLISM MEDICATIONS: None ANESTHESIA/SEDATION: Moderate (conscious) sedation was employed during this procedure. A total of Versed 2.0 mg and Fentanyl 100 mcg was administered intravenously. Moderate Sedation Time: 60 minutes. The patient's level of consciousness and vital signs were monitored continuously by radiology nursing throughout the procedure under my direct supervision. CONTRAST:  50 mL Isovue-300 FLUOROSCOPY TIME:  Fluoroscopy Time: 20 minutes and 24 seconds. COMPLICATIONS: None. PROCEDURE: Informed consent was obtained from the patient following explanation of the  procedure, risks, benefits and alternatives. The patient understands, agrees and consents for the procedure. All questions were addressed. A time out was performed prior to the initiation of the procedure. Maximal barrier sterile technique utilized including caps, mask, sterile gowns, sterile gloves, large sterile drape, hand hygiene, and chlorhexidine prep. Ultrasound was used to confirm patency of the right common femoral vein. Under direct ultrasound guidance, 2 separate micropuncture sets were utilized in obtaining access of the right common femoral vein. At both access sites, 6 [place] vascular sheaths were placed. A 5 [place] JB 1 catheter was advanced through one of the femoral sheaths. This was used to selectively catheterize the main pulmonary outflow tract. Pulmonary artery pressure measurement was obtained in the main pulmonary artery. The catheter was further advanced into the left pulmonary artery and selective arteriography performed. The catheter was further advanced into a lower lobe pulmonary artery branch over a hydrophilic guidewire. The catheter was removed over a Rosen wire. A 5 French H1 catheter was advanced through the second femoral venous sheath and used to catheterize the right pulmonary artery. Arteriography was performed and pressure measurements obtained. Further selective catheterization of both lower lobes supplying and upper lobe supply was performed through the H1 catheter. Ultimately, the catheter was removed over a Rosen wire with the catheter advanced into an inferior right upper lobe branch. Over the right pulmonary artery Rosen wire, an 18 cm infusion length EKOS ultrasound assisted thrombolytic catheter was advanced. The guidewire was removed and the coaxial ultrasound infusion catheter was advanced. Over the left pulmonary artery Rosen wire, a 12 cm infusion length EKOS catheter was advanced. The guidewire was removed and the coaxial ultrasound infusion catheter advanced  through the catheter. Femoral venous sheaths were secured at the right groin with Ethilon retention sutures. Overlying  dressings were applied. Thrombolytic infusion of tPA was begun via the EKOS system at a dose of 1 milligram/hour via each pulmonary arterial infusion catheter. Infusion will be continued overnight for 12 hours and pulmonary artery pressures re- checked in the morning. FINDINGS: The measured pulmonary artery pressures are severely elevated. Main pulmonary artery:   63/31 (mean 44) mm Hg Left pulmonary artery:    67/53 (mean 61) mm Hg Right pulmonary artery:  68/32 (mean 47) mm Hg Selective arteriography of the left pulmonary artery demonstrates significant thrombus burden nearly occluding lower lobe flow and also with nonocclusive thrombus visualized and upper lobe pulmonary artery branches. Right-sided pulmonary arterial anatomy was difficult to catheterize due to descending angulation of the right pulmonary artery with relatively early pulmonary arterial bifurcation. Initial inferior pulmonary arterial catheterization with selective arteriography demonstrates nonocclusive thrombus in a lower lobe pulmonary arterial branch. During the procedure the catheter retracted into upper lobe supply with selective arteriography demonstrated nonocclusive thrombus in upper lobe branches. EKOS catheters were positioned with the left sided catheter extending into a lower lobe pulmonary artery branch and the right-sided catheter lying transversely and terminating in an inferior upper lobe branch. IMPRESSION: Pulmonary arterial thrombolytic therapy begun bilaterally for significant bilateral pulmonary embolism. EKOS ultrasound assisted thrombolytic therapy was begun at a dose of 1 milligram/hour of tPA. This will be continued overnight for 12 hours of total infusion prior to rechecking pulmonary artery pressures. Arteriography demonstrates significant bilateral pulmonary arterial thrombus burden, slightly greater  on the left. Measured pulmonary arterial pressures are also severely elevated. Electronically Signed   By: Aletta Edouard M.D.   On: 12/02/2015 09:12   Ir US Guide Vasc Access Right  12/02/2015  INDICATION: Submassive bilateral pulmonary embolism with lack of clinical improvement on IV heparin and echocardiography evidence of right heart strain. EXAM: IR INFUSION THROMBOL ARTERIAL INITIAL (MS); ADDITIONAL ARTERIOGRAPHY; BILATERAL PULMONARY ARTERIOGRAPHY; IR ULTRASOUND GUIDANCE VASC ACCESS RIGHT 1. ULTRASOUND GUIDANCE FOR VASCULAR ACCESS OF THE RIGHT COMMON FEMORAL VEIN X 2 2. SECOND ORDER SELECTIVE LEFT-SIDED PULMONARY ARTERIOGRAPHY 3. SECOND ORDER SELECTIVE RIGHT-SIDED PULMONARY ARTERIOGRAPHY 4. THIRD ORDER RIGHT INFERIOR PULMONARY ARTERIOGRAPHY 5. THIRD ORDER RIGHT SUPERIOR PULMONARY ARTERIOGRAPHY 6. TRANSCATHETER ARTERIAL THROMBOLYSIS FOR TREATMENT OF BILATERAL PULMONARY EMBOLISM MEDICATIONS: None ANESTHESIA/SEDATION: Moderate (conscious) sedation was employed during this procedure. A total of Versed 2.0 mg and Fentanyl 100 mcg was administered intravenously. Moderate Sedation Time: 60 minutes. The patient's level of consciousness and vital signs were monitored continuously by radiology nursing throughout the procedure under my direct supervision. CONTRAST:  50 mL Isovue-300 FLUOROSCOPY TIME:  Fluoroscopy Time: 20 minutes and 24 seconds. COMPLICATIONS: None. PROCEDURE: Informed consent was obtained from the patient following explanation of the procedure, risks, benefits and alternatives. The patient understands, agrees and consents for the procedure. All questions were addressed. A time out was performed prior to the initiation of the procedure. Maximal barrier sterile technique utilized including caps, mask, sterile gowns, sterile gloves, large sterile drape, hand hygiene, and chlorhexidine prep. Ultrasound was used to confirm patency of the right common femoral vein. Under direct ultrasound guidance, 2  separate micropuncture sets were utilized in obtaining access of the right common femoral vein. At both access sites, 6 [place] vascular sheaths were placed. A 5 [place] JB 1 catheter was advanced through one of the femoral sheaths. This was used to selectively catheterize the main pulmonary outflow tract. Pulmonary artery pressure measurement was obtained in the main pulmonary artery. The catheter was further advanced into the left pulmonary artery and  selective arteriography performed. The catheter was further advanced into a lower lobe pulmonary artery branch over a hydrophilic guidewire. The catheter was removed over a Rosen wire. A 5 French H1 catheter was advanced through the second femoral venous sheath and used to catheterize the right pulmonary artery. Arteriography was performed and pressure measurements obtained. Further selective catheterization of both lower lobes supplying and upper lobe supply was performed through the H1 catheter. Ultimately, the catheter was removed over a Rosen wire with the catheter advanced into an inferior right upper lobe branch. Over the right pulmonary artery Rosen wire, an 18 cm infusion length EKOS ultrasound assisted thrombolytic catheter was advanced. The guidewire was removed and the coaxial ultrasound infusion catheter was advanced. Over the left pulmonary artery Rosen wire, a 12 cm infusion length EKOS catheter was advanced. The guidewire was removed and the coaxial ultrasound infusion catheter advanced through the catheter. Femoral venous sheaths were secured at the right groin with Ethilon retention sutures. Overlying dressings were applied. Thrombolytic infusion of tPA was begun via the EKOS system at a dose of 1 milligram/hour via each pulmonary arterial infusion catheter. Infusion will be continued overnight for 12 hours and pulmonary artery pressures re- checked in the morning. FINDINGS: The measured pulmonary artery pressures are severely elevated. Main pulmonary  artery:   63/31 (mean 44) mm Hg Left pulmonary artery:    67/53 (mean 61) mm Hg Right pulmonary artery:  68/32 (mean 47) mm Hg Selective arteriography of the left pulmonary artery demonstrates significant thrombus burden nearly occluding lower lobe flow and also with nonocclusive thrombus visualized and upper lobe pulmonary artery branches. Right-sided pulmonary arterial anatomy was difficult to catheterize due to descending angulation of the right pulmonary artery with relatively early pulmonary arterial bifurcation. Initial inferior pulmonary arterial catheterization with selective arteriography demonstrates nonocclusive thrombus in a lower lobe pulmonary arterial branch. During the procedure the catheter retracted into upper lobe supply with selective arteriography demonstrated nonocclusive thrombus in upper lobe branches. EKOS catheters were positioned with the left sided catheter extending into a lower lobe pulmonary artery branch and the right-sided catheter lying transversely and terminating in an inferior upper lobe branch. IMPRESSION: Pulmonary arterial thrombolytic therapy begun bilaterally for significant bilateral pulmonary embolism. EKOS ultrasound assisted thrombolytic therapy was begun at a dose of 1 milligram/hour of tPA. This will be continued overnight for 12 hours of total infusion prior to rechecking pulmonary artery pressures. Arteriography demonstrates significant bilateral pulmonary arterial thrombus burden, slightly greater on the left. Measured pulmonary arterial pressures are also severely elevated. Electronically Signed   By: Aletta Edouard M.D.   On: 12/02/2015 09:12   Dg Chest Port 1 View  12/02/2015  CLINICAL DATA:  75 year old female, pulmonary embolism with thrombolysis. EXAM: PORTABLE CHEST 1 VIEW COMPARISON:  11/29/2015, CT 11/29/2015 FINDINGS: Cardiomediastinal silhouette unchanged in size and contour. Calcifications of the aortic arch. Bilateral pulmonary arterial thrombolysis  catheters in place, unchanged from the most recent comparison angiogram. Low lung volumes with basilar opacity. No pneumothorax or pleural effusion. IMPRESSION: Low lung volumes with basilar opacity/consolidation. Unchanged position of bilateral pulmonary arterial thrombolysis catheters. Aortic atherosclerosis Signed, Dulcy Fanny. Earleen Newport, DO [facility] Electronically Signed   By: Corrie Mckusick D.O.   On: 12/02/2015 10:32   Ir Infusion Thrombol Arterial Initial (ms)  12/02/2015  INDICATION: Submassive bilateral pulmonary embolism with lack of clinical improvement on IV heparin and echocardiography evidence of right heart strain. EXAM: IR INFUSION THROMBOL ARTERIAL INITIAL (MS); ADDITIONAL ARTERIOGRAPHY;  BILATERAL PULMONARY ARTERIOGRAPHY; IR ULTRASOUND GUIDANCE VASC ACCESS RIGHT 1. ULTRASOUND GUIDANCE FOR VASCULAR ACCESS OF THE RIGHT COMMON FEMORAL VEIN X 2 2. SECOND ORDER SELECTIVE LEFT-SIDED PULMONARY ARTERIOGRAPHY 3. SECOND ORDER SELECTIVE RIGHT-SIDED PULMONARY ARTERIOGRAPHY 4. THIRD ORDER RIGHT INFERIOR PULMONARY ARTERIOGRAPHY 5. THIRD ORDER RIGHT SUPERIOR PULMONARY ARTERIOGRAPHY 6. TRANSCATHETER ARTERIAL THROMBOLYSIS FOR TREATMENT OF BILATERAL PULMONARY EMBOLISM MEDICATIONS: None ANESTHESIA/SEDATION: Moderate (conscious) sedation was employed during this procedure. A total of Versed 2.0 mg and Fentanyl 100 mcg was administered intravenously. Moderate Sedation Time: 60 minutes. The patient's level of consciousness and vital signs were monitored continuously by radiology nursing throughout the procedure under my direct supervision. CONTRAST:  50 mL Isovue-300 FLUOROSCOPY TIME:  Fluoroscopy Time: 20 minutes and 24 seconds. COMPLICATIONS: None. PROCEDURE: Informed consent was obtained from the patient following explanation of the procedure, risks, benefits and alternatives. The patient understands, agrees and consents for the procedure. All questions were  addressed. A time out was performed prior to the initiation of the procedure. Maximal barrier sterile technique utilized including caps, mask, sterile gowns, sterile gloves, large sterile drape, hand hygiene, and chlorhexidine prep. Ultrasound was used to confirm patency of the right common femoral vein. Under direct ultrasound guidance, 2 separate micropuncture sets were utilized in obtaining access of the right common femoral vein. At both access sites, 6 [place] vascular sheaths were placed. A 5 [place] JB 1 catheter was advanced through one of the femoral sheaths. This was used to selectively catheterize the main pulmonary outflow tract. Pulmonary artery pressure measurement was obtained in the main pulmonary artery. The catheter was further advanced into the left pulmonary artery and selective arteriography performed. The catheter was further advanced into a lower lobe pulmonary artery branch over a hydrophilic guidewire. The catheter was removed over a Rosen wire. A 5 French H1 catheter was advanced through the second femoral venous sheath and used to catheterize the right pulmonary artery. Arteriography was performed and pressure measurements obtained. Further selective catheterization of both lower lobes supplying and upper lobe supply was performed through the H1 catheter. Ultimately, the catheter was removed over a Rosen wire with the catheter advanced into an inferior right upper lobe branch. Over the right pulmonary artery Rosen wire, an 18 cm infusion length EKOS ultrasound assisted thrombolytic catheter was advanced. The guidewire was removed and the coaxial ultrasound infusion catheter was advanced. Over the left pulmonary artery Rosen wire, a 12 cm infusion length EKOS catheter was advanced. The guidewire was removed and the coaxial ultrasound infusion catheter advanced through the catheter. Femoral venous sheaths were secured at the right groin with Ethilon retention sutures. Overlying dressings were  applied. Thrombolytic infusion of tPA was begun via the EKOS system at a dose of 1 milligram/hour via each pulmonary arterial infusion catheter. Infusion will be continued overnight for 12 hours and pulmonary artery pressures re- checked in the morning. FINDINGS: The measured pulmonary artery pressures are severely elevated. Main pulmonary artery:   63/31 (mean 44) mm Hg Left pulmonary artery:    67/53 (mean 61) mm Hg Right pulmonary artery:  68/32 (mean 47) mm Hg Selective arteriography of the left pulmonary artery demonstrates significant thrombus burden nearly occluding lower lobe flow and also with nonocclusive thrombus visualized and upper lobe pulmonary artery branches. Right-sided pulmonary arterial anatomy was difficult to catheterize due to descending angulation of the right pulmonary artery with relatively early pulmonary arterial bifurcation. Initial inferior pulmonary arterial catheterization with selective arteriography demonstrates nonocclusive thrombus in a lower lobe pulmonary arterial branch. During the  procedure the catheter retracted into upper lobe supply with selective arteriography demonstrated nonocclusive thrombus in upper lobe branches. EKOS catheters were positioned with the left sided catheter extending into a lower lobe pulmonary artery branch and the right-sided catheter lying transversely and terminating in an inferior upper lobe branch. IMPRESSION: Pulmonary arterial thrombolytic therapy begun bilaterally for significant bilateral pulmonary embolism. EKOS ultrasound assisted thrombolytic therapy was begun at a dose of 1 milligram/hour of tPA. This will be continued overnight for 12 hours of total infusion prior to rechecking pulmonary artery pressures. Arteriography demonstrates significant bilateral pulmonary arterial thrombus burden, slightly greater on the left. Measured pulmonary arterial pressures are also severely elevated. Electronically Signed   By: Aletta Edouard M.D.   On:  12/02/2015 09:12   Ir Infusion Thrombol Arterial Initial (ms)  12/02/2015  INDICATION: Submassive bilateral pulmonary embolism with lack of clinical improvement on IV heparin and echocardiography evidence of right heart strain. EXAM: IR INFUSION THROMBOL ARTERIAL INITIAL (MS); ADDITIONAL ARTERIOGRAPHY; BILATERAL PULMONARY ARTERIOGRAPHY; IR ULTRASOUND GUIDANCE VASC ACCESS RIGHT 1. ULTRASOUND GUIDANCE FOR VASCULAR ACCESS OF THE RIGHT COMMON FEMORAL VEIN X 2 2. SECOND ORDER SELECTIVE LEFT-SIDED PULMONARY ARTERIOGRAPHY 3. SECOND ORDER SELECTIVE RIGHT-SIDED PULMONARY ARTERIOGRAPHY 4. THIRD ORDER RIGHT INFERIOR PULMONARY ARTERIOGRAPHY 5. THIRD ORDER RIGHT SUPERIOR PULMONARY ARTERIOGRAPHY 6. TRANSCATHETER ARTERIAL THROMBOLYSIS FOR TREATMENT OF BILATERAL PULMONARY EMBOLISM MEDICATIONS: None ANESTHESIA/SEDATION: Moderate (conscious) sedation was employed during this procedure. A total of Versed 2.0 mg and Fentanyl 100 mcg was administered intravenously. Moderate Sedation Time: 60 minutes. The patient's level of consciousness and vital signs were monitored continuously by radiology nursing throughout the procedure under my direct supervision. CONTRAST:  50 mL Isovue-300 FLUOROSCOPY TIME:  Fluoroscopy Time: 20 minutes and 24 seconds. COMPLICATIONS: None. PROCEDURE: Informed consent was obtained from the patient following explanation of the procedure, risks, benefits and alternatives. The patient understands, agrees and consents for the procedure. All questions were addressed. A time out was performed prior to the initiation of the procedure. Maximal barrier sterile technique utilized including caps, mask, sterile gowns, sterile gloves, large sterile drape, hand hygiene, and chlorhexidine prep. Ultrasound was used to confirm patency of the right common femoral vein. Under direct ultrasound guidance, 2 separate micropuncture sets were utilized in obtaining access of the right common femoral vein. At both access sites, 6  [place] vascular sheaths were placed. A 5 [place] JB 1 catheter was advanced through one of the femoral sheaths. This was used to selectively catheterize the main pulmonary outflow tract. Pulmonary artery pressure measurement was obtained in the main pulmonary artery. The catheter was further advanced into the left pulmonary artery and selective arteriography performed. The catheter was further advanced into a lower lobe pulmonary artery branch over a hydrophilic guidewire. The catheter was removed over a Rosen wire. A 5 French H1 catheter was advanced through the second femoral venous sheath and used to catheterize the right pulmonary artery. Arteriography was performed and pressure measurements obtained. Further selective catheterization of both lower lobes supplying and upper lobe supply was performed through the H1 catheter. Ultimately, the catheter was removed over a Rosen wire with the catheter advanced into an inferior right upper lobe branch. Over the right pulmonary artery Rosen wire, an 18 cm infusion length EKOS ultrasound assisted thrombolytic catheter was advanced. The guidewire was removed and the coaxial ultrasound infusion catheter was advanced. Over the left pulmonary artery Rosen wire, a 12 cm infusion length EKOS catheter was advanced. The guidewire was removed and the coaxial ultrasound infusion catheter advanced through  the catheter. Femoral venous sheaths were secured at the right groin with Ethilon retention sutures. Overlying dressings were applied. Thrombolytic infusion of tPA was begun via the EKOS system at a dose of 1 milligram/hour via each pulmonary arterial infusion catheter. Infusion will be continued overnight for 12 hours and pulmonary artery pressures re- checked in the morning. FINDINGS: The measured pulmonary artery pressures are severely elevated. Main pulmonary artery:   63/31 (mean 44) mm Hg Left pulmonary artery:    67/53 (mean 61) mm Hg Right pulmonary artery:  68/32 (mean 47)  mm Hg Selective arteriography of the left pulmonary artery demonstrates significant thrombus burden nearly occluding lower lobe flow and also with nonocclusive thrombus visualized and upper lobe pulmonary artery branches. Right-sided pulmonary arterial anatomy was difficult to catheterize due to descending angulation of the right pulmonary artery with relatively early pulmonary arterial bifurcation. Initial inferior pulmonary arterial catheterization with selective arteriography demonstrates nonocclusive thrombus in a lower lobe pulmonary arterial branch. During the procedure the catheter retracted into upper lobe supply with selective arteriography demonstrated nonocclusive thrombus in upper lobe branches. EKOS catheters were positioned with the left sided catheter extending into a lower lobe pulmonary artery branch and the right-sided catheter lying transversely and terminating in an inferior upper lobe branch. IMPRESSION: Pulmonary arterial thrombolytic therapy begun bilaterally for significant bilateral pulmonary embolism. EKOS ultrasound assisted thrombolytic therapy was begun at a dose of 1 milligram/hour of tPA. This will be continued overnight for 12 hours of total infusion prior to rechecking pulmonary artery pressures. Arteriography demonstrates significant bilateral pulmonary arterial thrombus burden, slightly greater on the left. Measured pulmonary arterial pressures are also severely elevated. Electronically Signed   By: Aletta Edouard M.D.   On: 12/02/2015 09:12   Ir Jacolyn Reedy F/u Eval Art/ven Final Day (ms)  12/02/2015  INDICATION: History of sub massive pulmonary embolism, post initiation of bilateral pulmonary arterial thrombolysis on 12/01/2015. Patient has completed 12 hours of bilateral pulmonary arterial lytic infusion and returns today to the interventional radiology suite for repeat pulmonary arterial pressure measurements. Patient reports subjective improvement in her preprocedural shortness  of breath. She reports a minimal amount of losing about her nasal cannula insertion site as well as the site of a right upper extremity IV. She is otherwise without complaint. EXAM: PULMONARY ARTERIAL PRESSURE MEASUREMENT AND PULMONARY INFUSION CATHETER REMOVAL COMPARISON:  Initiation of bilateral pulmonary arterial catheter directed thrombolysis - 12/01/2015 ; chest CT - 11/29/2015; chest radiograph - earlier same day MEDICATIONS: None CONTRAST:  None FLUOROSCOPY TIME:  None COMPLICATIONS: None immediate. TECHNIQUE: Chest radiograph performed earlier this morning demonstrated unchanged positioning of the bilateral deep dose infusion catheters. Both pulmonary arterial catheter infusion wires were removed and the multi side-hole infusion catheter was retracted to the level of the main pulmonary artery. Pressure measurements were acquired from this location. At this point, the procedure was terminated. All wires, catheters and sheaths were removed from the patient. Hemostasis was achieved at the right groin access site with manual compression. A dressing was placed. The patient tolerated the procedure well without immediate postprocedural complication. FINDINGS: Acquired pressure measurements as follows: Preprocedural main pulmonary artery - 63/31; mean - 44 (normal: < 25/10) Postprocedural main pulmonary artery - 64/30; mean - 44 IMPRESSION: No significant reduction and mean main pulmonary arterial pressure measurement following 12 hours of bilateral ultrasound assisted pulmonary arterial lysis. PLAN: - recommend continuing IV heparin until transitioned to long-term outpatient anticoagulant as per providing clinical team. - would suggest obtaining bilateral lower extremity venous  Doppler ultrasounds and consideration of IVC filter placement for temporary caval interruption as clinically indicated. Electronically Signed   By: Sandi Mariscal M.D.   On: 12/02/2015 11:26    Microbiology: Recent Results (from the past  240 hour(s))  MRSA PCR Screening     Status: None   Collection Time: 11/29/15  9:23 PM  Result Value Ref Range Status   MRSA by PCR NEGATIVE NEGATIVE Final    Comment:        The GeneXpert MRSA Assay (FDA approved for NASAL specimens only), is one component of a comprehensive MRSA colonization surveillance program. It is not intended to diagnose MRSA infection nor to guide or monitor treatment for MRSA infections.      Labs: Basic Metabolic Panel:  Recent Labs Lab 12/04/15 0440 12/05/15 0257 12/06/15 0350 12/08/15 0533  NA 137 139 140 137  K 4.1 4.4 4.4 4.2  CL 105 103 106 105  CO2 '26 26 26 28  ' GLUCOSE 120* 123* 114* 135*  BUN '11 11 13 15  ' CREATININE 0.96 0.95 0.97 1.07*  CALCIUM 9.0 9.4 9.5 9.4  MG  --   --  2.1 2.3   Liver Function Tests: No results for input(s): AST, ALT, ALKPHOS, BILITOT, PROT, ALBUMIN in the last 168 hours. No results for input(s): LIPASE, AMYLASE in the last 168 hours. No results for input(s): AMMONIA in the last 168 hours. CBC:  Recent Labs Lab 12/02/15 0945 12/02/15 1600 12/03/15 0331 12/06/15 0350 12/09/15 0614  WBC 7.3 7.6 7.3 6.1 5.7  HGB 12.2 12.1 11.8* 11.8* 12.6  HCT 40.3 38.9 37.4 38.4 41.2  MCV 87.4 86.6 86.4 87.7 86.0  PLT 123* 123* 117* 199 275   Cardiac Enzymes: No results for input(s): CKTOTAL, CKMB, CKMBINDEX, TROPONINI in the last 168 hours. BNP: BNP (last 3 results) No results for input(s): BNP in the last 8760 hours.  ProBNP (last 3 results) No results for input(s): PROBNP in the last 8760 hours.  CBG:  Recent Labs Lab 12/07/15 0756 12/07/15 1746 12/08/15 0815 12/08/15 1742 12/09/15 0809  GLUCAP 107* 139* 116* 115* 109*       Signed:  Dia Crawford, MD [facility] [phone] pager

## 2015-12-10 ENCOUNTER — Other Ambulatory Visit: Payer: Self-pay

## 2015-12-10 NOTE — Progress Notes (Signed)
Writer returned call to [name] at [facility] for hospital follow up appt.  Appt made for 7/25 at 4 pm.  Claiborne Billings voiced understanding.

## 2015-12-11 ENCOUNTER — Ambulatory Visit: Payer: Medicare Other | Admitting: Physical Therapy

## 2015-12-16 ENCOUNTER — Ambulatory Visit (HOSPITAL_BASED_OUTPATIENT_CLINIC_OR_DEPARTMENT_OTHER): Payer: Medicare Other | Admitting: Oncology

## 2015-12-16 ENCOUNTER — Ambulatory Visit: Payer: Medicare Other | Admitting: Physical Therapy

## 2015-12-16 ENCOUNTER — Telehealth: Payer: Self-pay | Admitting: Oncology

## 2015-12-16 VITALS — BP 130/76 | HR 95 | Temp 98.3°F | Resp 20 | Wt 284.8 lb

## 2015-12-16 DIAGNOSIS — Z170 Estrogen receptor positive status [ER+]: Secondary | ICD-10-CM

## 2015-12-16 DIAGNOSIS — I2692 Saddle embolus of pulmonary artery without acute cor pulmonale: Secondary | ICD-10-CM

## 2015-12-16 DIAGNOSIS — C50412 Malignant neoplasm of upper-outer quadrant of left female breast: Secondary | ICD-10-CM | POA: Diagnosis not present

## 2015-12-16 NOTE — Telephone Encounter (Signed)
appt made and avs printed °

## 2015-12-16 NOTE — Progress Notes (Signed)
[name]  [phone] [phone]     ID: [name] OB: 28-Nov-1940  MR#: [id number]  DDU#:[id number]  PCP: Sherry Saner., MD GYN:  [name] SU: [name] OTHER MD:  [name], [name] supple  CHIEF COMPLAINT: Early stage breast cancer  TREATMENT: Exemestane  BREAST CANCER HISTORY: From [name]'s initial consult note 01/29/2013:  "[name] is a 75 y.o. female who is seen for an initial consultation visit. The patient has had multiple medical issues recently. By report the patient proceeded with a hysterectomy/BSO for endometrial cancer recently at [name]. The patient did have some difficulty recovering from this and was in a skilled nursing facility for a short period of time. She then thought that she felt a possible mass within the left breast and she sought further evaluation for this. The patient proceeded with a mammogram and ultrasound which corresponded to suspicious findings.a core biopsy was obtained and this returned positive for invasive ductal carcinoma. Receptor studies have indicated that the tumor is ER positive, PR positive, and HER-2/neu negative. She then proceeded to undergo an MRI scan of the breasts bilaterally on 12/26/2012. This revealed a 1.9 cm irregular enhancing mass within the left breast at the 12:00 position. This corresponded to the recently diagnosed invasive mammary carcinoma.   The patient was seen by [name] and she proceeded to perform a lumpectomy and sentinel lymph node evaluation on 01/11/2013. The patient's pathology returned positive for an invasive ductal carcinoma measuring 1.9 cm. The margins were negative with the closest margin being 1 cm. The sentinel lymph nodes were negative. This therefore represented a T1 C. N0 M0 tumor. "  Her subsequent history is as detailed below  INTERVAL HISTORY: [name] returns today for followup of her breast cancer.  Since her last visit here, she had an admission, 11/29/2015, for what proved to be an acute saddle pulmonary embolism, which was treated with thrombolytic therapy. She was discharged on apixaban to a rehabilitation facility where she is currently residing. She is hoping to be able to get back to her own home within the next week or two.  She continues on exemestane. She tolerates this well. She had concerns that someone expressed that there may be an interaction between the exemestane and her anticoagulants.  REVIEW OF SYSTEMS: [name] still feels quite short of breath, particularly when walking or doing any significant activity, but she is able to participate in rehabilitation 3 hours a day. It does make her exhausted. She has not had any bleeding problems from the apixaban. She sleeps poorly. She continues to have muscle aches and back and joint pain which is not new or more persistent than prior. She has a little bit of a dry cough, but she has had no fever or hemoptysis. Her appetite is poor. She has occasional problems with nausea. She has 2 bowel movements most days. She has some stress urinary incontinence. She feels anxious and depressed. A detailed review of systems was otherwise stable  PAST MEDICAL HISTORY: Past Medical History:  Diagnosis Date  . Allergy   . Arthritis    "joints; mostly on the right side" (01/11/2013)  . Breast cancer ([name])    "left" (01/11/2013)  . Cancer [name])    endometrial  . Chronic lower back pain   . Cold    just started 01/02/13 cough  . Complication of anesthesia    very phobic about needles.last surgery had to sedate before doing iv  .  Depression   . Exertional shortness of breath   . Hyperlipidemia   . Hypertension   . Osteoporosis    knees   . Status post radiation therapy within last four weeks 03/19/13-05/03/13   lt breast 60.4GY  . Uterine cancer ([name])     PAST SURGICAL HISTORY: Past Surgical History:  Procedure Laterality Date  . ABDOMINAL  HYSTERECTOMY  12/14/2012  . BREAST BIOPSY Left 12/2012  . BREAST LUMPECTOMY WITH NEEDLE LOCALIZATION AND AXILLARY SENTINEL LYMPH NODE BX Left 01/11/2013  . BREAST LUMPECTOMY WITH NEEDLE LOCALIZATION AND AXILLARY SENTINEL LYMPH NODE BX Left 01/11/2013   Procedure: BREAST LUMPECTOMY WITH NEEDLE LOCALIZATION AND AXILLARY SENTINEL LYMPH NODE BX;  Surgeon: Sherry Bookbinder, MD;  Location: [facility];  Service: General;  Laterality: Left;  . CHOLECYSTECTOMY  1970  . DILATION AND CURETTAGE OF UTERUS     "@ least one" (01/11/2013)  . FOOT TENDON SURGERY Right   . KNEE ARTHROSCOPY Right 1990's   "twice" (01/11/2013)  . REPLACEMENT TOTAL KNEE Bilateral 2011-2012  . TUBAL LIGATION      FAMILY HISTORY No family history on file.  the patient does not know the cause of death of either parent. She was a foster child and really knows very little about her family. She has some half sisters that she is aware of, but does not know them very well.   GYNECOLOGIC HISTORY:   menarche age 7, first live birth age 43, the patient is [place] P2. She went through the change of life around the age of 14. She took hormone replacement through the women's health study. She also took birth control pills remotely, with no complications   SOCIAL HISTORY:  [name] worked as an Tourist information centre manager in [name]. She is divorced, and lives by herself, with no pets. Daughter [name] unfortunately died, sudden death, cause unclear. Son [name] lives in [place] and has a Designer, jewellery in psychology. The patient has no grandchildren. She is not a Ambulance person.    ADVANCED DIRECTIVES: Not in place but the patient intends to name her son as her healthcare power of attorney. He can be reached at [phone]. At the 10/10/2013 visit the patient was given the appropriate documents to complete and notarize at her discretion   HEALTH MAINTENANCE: Social History  Substance Use Topics  . Smoking status: Never Smoker  . Smokeless tobacco: Never Used  . Alcohol  use No     Colonoscopy:  PAP: Status post hysterectomy and bilateral salpingo-oophorectomy   Bone density: April 2014, showed minimal osteopenia, with a T score of -1.1  Lipid panel:  Allergies  Allergen Reactions  . Iodine Rash    Current Outpatient Prescriptions  Medication Sig Dispense Refill  . apixaban (ELIQUIS) 5 MG TABS tablet Take 2 tablets (10 mg total) by mouth 2 (two) times daily. Last dose will be on July 19 4 tablet 0  . apixaban (ELIQUIS) 5 MG TABS tablet Take 1 tablet (5 mg total) by mouth 2 (two) times daily. Start on 20 July 60 tablet 0  . buPROPion (WELLBUTRIN SR) 150 MG 12 hr tablet Take 150 mg by mouth 2 (two) times daily.    . carisoprodol (SOMA) 350 MG tablet Take 350 mg by mouth every 6 (six) hours.     . Diclofenac Sodium (PENNSAID) 1.5 % SOLN Place 10 drops onto the skin 4 (four) times daily. For pain    . DULoxetine (CYMBALTA) 20 MG capsule Take 1 capsule (20 mg total) by mouth daily. 30 capsule 3  .  exemestane (AROMASIN) 25 MG tablet Take 1 tablet (25 mg total) by mouth daily. 90 tablet 3  . fenofibrate 160 MG tablet Take 160 mg by mouth daily.    Marland Kitchen FIBER COMPLETE TABS Take 1 tablet by mouth daily.    Marland Kitchen gabapentin (NEURONTIN) 300 MG capsule Take 1 capsule (300 mg total) by mouth at bedtime. 90 capsule 4  . HYDROcodone-acetaminophen (NORCO/VICODIN) 5-325 MG tablet Take 1 tablet by mouth every 6 (six) hours as needed for moderate pain. 8 tablet 0  . Menthol-Methyl Salicylate (ICY HOT) 13-08 % STCK Apply 1 application topically daily as needed (for pain).    . metFORMIN (GLUCOPHAGE) 500 MG tablet Take 1 tablet (500 mg total) by mouth 2 (two) times daily with a meal. 60 tablet 0  . metoprolol succinate (TOPROL-XL) 50 MG 24 hr tablet Take 50 mg by mouth 2 (two) times daily.     . Multiple Vitamin (MULTIVITAMIN WITH MINERALS) TABS tablet Take 1 tablet by mouth daily.    . RESTASIS 0.05 % ophthalmic emulsion Place 1 drop into both eyes 2 (two) times daily.      No  current facility-administered medications for this visit.     OBJECTIVE: Elderly African American woman Examined in a wheelchair Vitals:   12/16/15 1542  BP: 130/76  Pulse: 95  Resp: 20  Temp: 98.3 F (36.8 C)     Body mass index is 45.97 kg/m.    ECOG FS:2 - Symptomatic, <50% confined to bed  Sclerae unicteric, pupils round and equal Oropharynx clear, slightly dry No cervical or supraclavicular adenopathy Lungs no rales or rhonchi Heart regular rate and rhythm Abd soft, obese, nontender, positive bowel sounds MSK no focal spinal tenderness Neuro: nonfocal, well oriented, appropriate affect Breasts: The right breast is unremarkable. The left breast is status post lumpectomy and radiation. There is no evidence of local recurrence. The left axilla is benign.   LAB RESULTS:  CMP     Component Value Date/Time   NA 137 12/08/2015 0533   NA 141 09/24/2015 1400   K 4.2 12/08/2015 0533   K 4.4 09/24/2015 1400   CL 105 12/08/2015 0533   CO2 28 12/08/2015 0533   CO2 26 09/24/2015 1400   GLUCOSE 135 (H) 12/08/2015 0533   GLUCOSE 173 (H) 09/24/2015 1400   BUN 15 12/08/2015 0533   BUN 20.3 09/24/2015 1400   CREATININE 1.07 (H) 12/08/2015 0533   CREATININE 1.2 (H) 09/24/2015 1400   CALCIUM 9.4 12/08/2015 0533   CALCIUM 10.1 09/24/2015 1400   PROT 7.4 09/24/2015 1400   ALBUMIN 3.6 09/24/2015 1400   AST 13 09/24/2015 1400   ALT 21 09/24/2015 1400   ALKPHOS 90 09/24/2015 1400   BILITOT 0.45 09/24/2015 1400   GFRNONAA 50 (L) 12/08/2015 0533   GFRAA 58 (L) 12/08/2015 0533    I No results found for: SPEP  Lab Results  Component Value Date   WBC 5.7 12/09/2015   NEUTROABS 5.6 11/29/2015   HGB 12.6 12/09/2015   HCT 41.2 12/09/2015   MCV 86.0 12/09/2015   PLT 275 12/09/2015      Chemistry      Component Value Date/Time   NA 137 12/08/2015 0533   NA 141 09/24/2015 1400   K 4.2 12/08/2015 0533   K 4.4 09/24/2015 1400   CL 105 12/08/2015 0533   CO2 28 12/08/2015 0533    CO2 26 09/24/2015 1400   BUN 15 12/08/2015 0533   BUN 20.3 09/24/2015 1400  CREATININE 1.07 (H) 12/08/2015 0533   CREATININE 1.2 (H) 09/24/2015 1400      Component Value Date/Time   CALCIUM 9.4 12/08/2015 0533   CALCIUM 10.1 09/24/2015 1400   ALKPHOS 90 09/24/2015 1400   AST 13 09/24/2015 1400   ALT 21 09/24/2015 1400   BILITOT 0.45 09/24/2015 1400       No results found for: LABCA2  No components found for: LABCA125  No results for input(s): INR in the last 168 hours.  Urinalysis    Component Value Date/Time   COLORURINE AMBER BIOCHEMICALS MAY BE AFFECTED BY COLOR (A) 03/31/2010 1116   APPEARANCEUR CLOUDY (A) 03/31/2010 1116   LABSPEC 1.031 (H) 03/31/2010 1116   PHURINE 5.5 03/31/2010 1116   GLUCOSEU NEGATIVE 03/31/2010 1116   HGBUR NEGATIVE 03/31/2010 1116   [place] 03/31/2010 1116   KETONESUR NEGATIVE 03/31/2010 1116   PROTEINUR NEGATIVE 03/31/2010 1116   UROBILINOGEN 0.2 03/31/2010 1116   NITRITE NEGATIVE 03/31/2010 1116   LEUKOCYTESUR  03/31/2010 1116    NEGATIVE MICROSCOPIC NOT DONE ON URINES WITH NEGATIVE PROTEIN, BLOOD, LEUKOCYTES, NITRITE, OR GLUCOSE <1000 mg/dL.    STUDIES: Study Result   CLINICAL DATA:  Cough and progressive shortness of breath.  EXAM: CT ANGIOGRAPHY CHEST WITH CONTRAST  TECHNIQUE: Multidetector CT imaging of the chest was performed using the standard protocol during bolus administration of intravenous contrast. Multiplanar CT image reconstructions and MIPs were obtained to evaluate the vascular anatomy.  CONTRAST:  80 mL of Isovue 370  COMPARISON:  Chest x-ray from earlier today. CT scan of the chest February 08, 2013  FINDINGS: The central airways are within normal limits. No pneumothorax identified. Scattered subsegmental atelectasis is identified. No pulmonary nodules, masses, or focal infiltrates are identified. Pulmonary emboli are identified. There is a large embolus in the left main pulmonary  artery with emboli seen in the left upper and lower lobe branches. Pulmonary emboli are also seen on the right involving right upper, middle, and lower lobe branches. Embolus extends into the periphery of the right main pulmonary artery. The aorta and main pulmonary artery are similar in caliber measuring 3.7 cm. There is no definitive flattening of the intraventricular septum or deviation to the left. The right and left ventricles are similar in size. There is no reflux of contrast into the inferior vena cava. No effusions or adenopathy.  Evaluation of the upper abdomen is limited due to arterial phase imaging with no acute abnormalities seen.  Visualized bones demonstrate degenerative changes in the spine but no other abnormalities.  Review of the MIP images confirms the above findings.  IMPRESSION: 1. There is a large burden of bilateral pulmonary emboli. Findings called to Sherry. Daleen Bo   Electronically Signed   By: Dorise Bullion III M.D   On: 11/29/2015 19:24      ASSESSMENT: 75 y.o. [place] woman status post left breast upper outer quadrant lumpectomy and sentinel lymph node sampling 01/11/2013 for a pT1c pN0, stage IA invasive ductal carcinoma, grade 1, estrogen receptor 100% positive, progesterone receptor 79% positive, with an MIB-1 of 17% and no HER-2 amplification.  (1) Oncotype DX score of 10 predicts an outside the breast risk of recurrence within 10 years of 7% if the patient's only systemic treatment is tamoxifen for 5 years. It also predicts no benefit from chemotherapy.  (2) the patient completed adjuvant radiation December 2014  (3) on anastrozole January through March of 2015, discontinued because of side effects  (4) exemestane started June 2015  (  5) status post robotic hysterectomy with bilateral salpingo-oophorectomy 12/14/2012 for a noninvasive grade 1 endometrial cancer   (6) saddle embolus diagnosed 11/29/2015, s/p thrombolysis  followed by apixaban as of 12/01/2015.  PLAN: We discussed Feven's clotting problem extensively today. She understands that while tamoxifen can increase the risk of blood clots, when aromatase inhibitors are compared with placebo there is no increasing clotting disorders. There is no reason to stop her exemestane, the plan being to continue that for a total of 5 years, to June 2015.  There is also no pharmacologic interaction between exemestane and apixaban. However she is getting her apixaban on an empty stomach and that can decrease absorption. I have written for her to receive it with food.  She understands that morbid obesity and a sedentary lifestyle are significant risk factors for developing a blood clot, and that as those factors are likely to persist, she may need lifelong anticoagulation. I do not see that obtaining a hypercoagulable panel would significantly impact that decision.  Of course uncontrolled cancer itself would be a strong procoagulant stimulus, but the patient's CT of the chest 11/29/2015 showed no evidence of metastatic disease to bones, liver, or lungs.  Accordingly the plan at this point as far as breast cancer is concerned is to continue the exemestane through June 2020. She will see me again in November of this year, and then in March after her next set of mammograms  Shalah tells me her son in [place] the [place] will be her healthcare power of attorney. We will formalize that decision at the next visit here.  Chauncey Cruel, MD   12/16/2015 4:02 PMTheodore of

## 2016-03-01 ENCOUNTER — Other Ambulatory Visit: Payer: Self-pay | Admitting: Internal Medicine

## 2016-03-01 DIAGNOSIS — R600 Localized edema: Secondary | ICD-10-CM

## 2016-03-09 ENCOUNTER — Ambulatory Visit
Admission: RE | Admit: 2016-03-09 | Discharge: 2016-03-09 | Disposition: A | Discharge status: Home / Self Care | Payer: Medicare Other | Source: Ambulatory Visit | Attending: Internal Medicine | Admitting: Internal Medicine

## 2016-03-09 DIAGNOSIS — R600 Localized edema: Secondary | ICD-10-CM

## 2016-03-30 ENCOUNTER — Ambulatory Visit: Payer: Medicare Other | Admitting: Oncology

## 2016-03-30 ENCOUNTER — Telehealth: Payer: Self-pay | Admitting: Oncology

## 2016-03-30 NOTE — Telephone Encounter (Signed)
11/7 appointment rescheduled to 11/13 per patient request.

## 2016-04-05 ENCOUNTER — Ambulatory Visit: Payer: Medicare Other | Admitting: Oncology

## 2016-04-23 ENCOUNTER — Encounter: Payer: Self-pay | Admitting: Neurology

## 2016-04-23 ENCOUNTER — Ambulatory Visit (INDEPENDENT_AMBULATORY_CARE_PROVIDER_SITE_OTHER): Payer: Medicare Other | Admitting: Neurology

## 2016-04-23 VITALS — BP 118/70 | HR 88 | Temp 98.7°F | Ht 65.0 in

## 2016-04-23 DIAGNOSIS — R29898 Other symptoms and signs involving the musculoskeletal system: Secondary | ICD-10-CM | POA: Diagnosis not present

## 2016-04-23 DIAGNOSIS — R200 Anesthesia of skin: Secondary | ICD-10-CM | POA: Diagnosis not present

## 2016-04-23 DIAGNOSIS — R292 Abnormal reflex: Secondary | ICD-10-CM

## 2016-04-23 NOTE — Progress Notes (Signed)
[facility] Clinic Note - Initial Visit   Date: 04/23/16  [name] MRN: [id number] DOB: 02-Oct-1940   Dear Dr. Karlton Lemon:  Thank you for your kind referral of [name] for consultation of right leg numbness and weakness. Although her history is well known to you, please allow [place] to reiterate it for the purpose of our medical record. The patient was accompanied to the clinic by self.    History of Present Illness: [name] is a 75 y.o. right-handed African American female with left breast cancer (2015) s/p radiation, bilateral PE and DVT s/p thrombolysis on apixaban, depression, polyarthritis, diabetes mellitus presenting for evaluation of right leg numbness.    In July, she developed saddle PE and DVT treated with thrombolytic therapy via R femoral vein access.  She was discharged on apixiban and discharged to rehab facility.  She now resides at home alone.  About 6 weeks later, began having numbness in the right leg.  It usually wakes her up from sleeping because right numbness can be severe entire right lower leg. She denies any leg pain, but endorses low back pain. Massage can help and usually resolves symptoms within 40 - 90 minutes.  She is unable to move her leg when this occurs and feels that she needs to drag the leg.  She also has sensation of right knee buckling and has fallen twice over the last month.  She has been using a rollator since left foot surgery in 2010.      Out-side paper records, electronic medical record, and images have been reviewed where available and summarized as:  IR angiogram 12/02/2015:  Pulmonary arterial thrombolytic therapy begun bilaterally for significant bilateral pulmonary embolism. EKOS ultrasound assisted thrombolytic therapy was begun at a dose of 1 milligram/hour of tPA. This will be continued overnight for 12 hours of total infusion prior to rechecking pulmonary artery pressures. Arteriography  demonstrates significant bilateral pulmonary arterial thrombus burden, slightly greater on the left. Measured pulmonary arterial pressures are also severely elevated. Lab Results  Component Value Date   HGBA1C 6.8 (H) 12/04/2015    Past Medical History:  Diagnosis Date  . Allergy   . Arthritis    "joints; mostly on the right side" (01/11/2013)  . Breast cancer ([place])    "left" (01/11/2013)  . Cancer [facility])    endometrial  . Chronic lower back pain   . Cold    just started 01/02/13 cough  . Complication of anesthesia    very phobic about needles.last surgery had to sedate before doing iv  . Depression   . Exertional shortness of breath   . Hyperlipidemia   . Hypertension   . Osteoporosis    knees   . Status post radiation therapy within last four weeks 03/19/13-05/03/13   lt breast 60.4GY  . Uterine cancer [facility])     Past Surgical History:  Procedure Laterality Date  . ABDOMINAL HYSTERECTOMY  12/14/2012  . BREAST BIOPSY Left 12/2012  . BREAST LUMPECTOMY WITH NEEDLE LOCALIZATION AND AXILLARY SENTINEL LYMPH NODE BX Left 01/11/2013  . BREAST LUMPECTOMY WITH NEEDLE LOCALIZATION AND AXILLARY SENTINEL LYMPH NODE BX Left 01/11/2013   Procedure: BREAST LUMPECTOMY WITH NEEDLE LOCALIZATION AND AXILLARY SENTINEL LYMPH NODE BX;  Surgeon: Rolm Bookbinder, MD;  Location: [facility];  Service: General;  Laterality: Left;  . CHOLECYSTECTOMY  1970  . DILATION AND CURETTAGE OF UTERUS     "@ least one" (01/11/2013)  . FOOT TENDON SURGERY Right   .  KNEE ARTHROSCOPY Right 1990's   "twice" (01/11/2013)  . REPLACEMENT TOTAL KNEE Bilateral 2011-2012  . TUBAL LIGATION       Medications:  Outpatient Encounter Prescriptions as of 04/23/2016  Medication Sig Note  . apixaban (ELIQUIS) 5 MG TABS tablet Take 2 tablets (10 mg total) by mouth 2 (two) times daily. Last dose will be on July 19   . buPROPion (WELLBUTRIN SR) 150 MG 12 hr tablet Take 150 mg by mouth 2 (two) times daily.   . carisoprodol (SOMA) 350  MG tablet Take 350 mg by mouth every 6 (six) hours.    . Diclofenac Sodium (PENNSAID) 1.5 % SOLN Place 10 drops onto the skin 4 (four) times daily. For pain   . DULoxetine (CYMBALTA) 20 MG capsule Take 1 capsule (20 mg total) by mouth daily.   Marland Kitchen exemestane (AROMASIN) 25 MG tablet Take 1 tablet (25 mg total) by mouth daily.   . fenofibrate 160 MG tablet Take 160 mg by mouth daily.   Marland Kitchen FIBER COMPLETE TABS Take 1 tablet by mouth daily.   Marland Kitchen FIBER PO Take by mouth. 04/23/2016: Received from: [place]: Take by mouth. gummies  . furosemide (LASIX) 20 MG tablet  04/23/2016: Received from: External Pharmacy  . gabapentin (NEURONTIN) 300 MG capsule Take 1 capsule (300 mg total) by mouth at bedtime.   Marland Kitchen HYDROcodone-acetaminophen (NORCO/VICODIN) 5-325 MG tablet Take 1 tablet by mouth every 6 (six) hours as needed for moderate pain.   . Menthol-Methyl Salicylate (ICY HOT) Q000111Q % STCK Apply 1 application topically daily as needed (for pain).   . metFORMIN (GLUCOPHAGE) 500 MG tablet Take 1 tablet (500 mg total) by mouth 2 (two) times daily with a meal.   . metoprolol succinate (TOPROL-XL) 50 MG 24 hr tablet Take 50 mg by mouth 2 (two) times daily.  11/29/2015: Takes twice daily  . Multiple Vitamin (MULTIVITAMIN WITH MINERALS) TABS tablet Take 1 tablet by mouth daily.   . RESTASIS 0.05 % ophthalmic emulsion Place 1 drop into both eyes 2 (two) times daily.    . [DISCONTINUED] apixaban (ELIQUIS) 5 MG TABS tablet Take 1 tablet (5 mg total) by mouth 2 (two) times daily. Start on 20 July   . [DISCONTINUED] diclofenac sodium (VOLTAREN) 1 % GEL Place onto the skin. 04/23/2016: Received from: [place]: Place onto the skin as needed.   No facility-administered encounter medications on file as of 04/23/2016.      Allergies:  Allergies  Allergen Reactions  . Iodine Rash    Family History: No family history on file.  Social History: Social History  Substance Use Topics  . Smoking  status: Never Smoker  . Smokeless tobacco: Never Used  . Alcohol use No   Social History   Social History Narrative   Lives alone in a one story home.  Has one living child.  Retired from school system.  Education: masters degree       Review of Systems:  CONSTITUTIONAL: No fevers, chills, night sweats, or weight loss.   EYES: No visual changes or eye pain ENT: No hearing changes.  No history of nose bleeds.   RESPIRATORY: No cough, wheezing and shortness of breath.   CARDIOVASCULAR: Negative for chest pain, and palpitations.   GI: Negative for abdominal discomfort, blood in stools or black stools.  No recent change in bowel habits.   GU:  No history of incontinence.   MUSCLOSKELETAL: +history of joint pain or swelling.  No myalgias.  SKIN: Negative for lesions, rash, and itching.   HEMATOLOGY/ONCOLOGY: Negative for prolonged bleeding, bruising easily, and swollen nodes.  +history of cancer.   ENDOCRINE: Negative for cold or heat intolerance, polydipsia or goiter.   PSYCH:  +depression or anxiety symptoms.   NEURO: As Above.   Vital Signs:  BP 118/70   Pulse 88   Temp 98.7 F (37.1 C)   Ht 5\' 5"  (1.651 m)   SpO2 96%    General Medical Exam:   General:  Arrived in wheelchair, morbidly obese, comfortable.    Neurological Exam: MENTAL STATUS including orientation to time, place, person, recent and remote memory, attention span and concentration, language, and fund of knowledge is normal.  Speech is not dysarthric.  CRANIAL NERVES: II:  No visual field defects.  Unremarkable fundi.   III-IV-VI: Pupils equal round and reactive to light.  Normal conjugate, extra-ocular eye movements in all directions of gaze.  No nystagmus.  No ptosis.   V:  Normal facial sensation.    VII:  Normal facial symmetry and movements.  VIII:  Normal hearing and vestibular function.   IX-X:  Normal palatal movement.   XI:  Normal shoulder shrug and head rotation.   XII:  Normal tongue strength  and range of motion, no deviation or fasciculation.  MOTOR:  Reduced ROM of nearly all large muscle groups due to arthritic pain.  She has 5/5 motor strength in the upper extremities and LLE.  RLE shows 3/5 with hip flexion, adductor 4/5, knee flexors and extensors are 5-/5.  No atrophy, fasciculations or abnormal movements.  No pronator drift.  Tone is normal.    MSRs:  Right                                                                 Left brachioradialis 2+  brachioradialis 2+  biceps 2+  biceps 2+  triceps 2+  triceps 2+  patellar 2+  patellar 2+  ankle jerk 2+  ankle jerk 2+  Hoffman no  Hoffman no  plantar response down  plantar response down   SENSORY:  Reduced vibration at the right knee and absent at the right ankle.  Temperature and pin prick intact throughout.  COORDINATION/GAIT: Normal finger-to- nose-finger.  Slowed finger tapping due to pain. Gait not tested as she is in a wheelchair an unable to stand from it  IMPRESSION: 1.  Right leg numbness and weakness especially with hip flexion and adduction concerning for L2-4 radiculopathy on the right. With her history of angiogram via R femoral access, a femoral neuropathy is considered but would not expect adductors to be weak.  MRI lumbar spine will be ordered.  She will also need home PT and possibly NCS/EMG, but with her body habitus, this testing will be technically challenging and limited as deep muscle will not be tested on anticoagulation.  2.  Diabetic polyneuropathy of the feet.  Diabetes is well-controlled (HbA1c 6.8)  Continue gapapentin 300mg  at bedtime.    3.  Multifactorial gait abnormality due to leg weakness, low back pain, obesity, and knee OA.  She uses a walker.  Fall precautions discussed, especially since she is on anticoagulation therapy   4.  Bilateral PE/DVT s/p thrombolysis on apixiban  I will see her back  after her testing  The duration of this appointment visit was 35 minutes of face-to-face time  with the patient.  Greater than 50% of this time was spent in counseling, explanation of diagnosis, planning of further management, and coordination of care.   Thank you for allowing me to participate in patient's care.  If I can answer any additional questions, I would be pleased to do so.    Sincerely,    Donika K. Posey Pronto, DO

## 2016-05-06 ENCOUNTER — Ambulatory Visit
Admission: RE | Admit: 2016-05-06 | Discharge: 2016-05-06 | Disposition: A | Discharge status: Home / Self Care | Payer: Medicare Other | Source: Ambulatory Visit | Attending: Neurology | Admitting: Neurology

## 2016-05-06 DIAGNOSIS — R292 Abnormal reflex: Secondary | ICD-10-CM

## 2016-05-06 DIAGNOSIS — R29898 Other symptoms and signs involving the musculoskeletal system: Secondary | ICD-10-CM

## 2016-05-06 DIAGNOSIS — R200 Anesthesia of skin: Secondary | ICD-10-CM

## 2016-05-10 NOTE — Progress Notes (Signed)
See next note

## 2016-05-12 ENCOUNTER — Ambulatory Visit (HOSPITAL_BASED_OUTPATIENT_CLINIC_OR_DEPARTMENT_OTHER): Payer: Medicare Other | Admitting: Oncology

## 2016-05-12 VITALS — BP 104/66 | HR 109 | Temp 98.1°F | Resp 21 | Ht 65.0 in | Wt 270.7 lb

## 2016-05-12 DIAGNOSIS — Z170 Estrogen receptor positive status [ER+]: Secondary | ICD-10-CM | POA: Diagnosis not present

## 2016-05-12 DIAGNOSIS — C50412 Malignant neoplasm of upper-outer quadrant of left female breast: Secondary | ICD-10-CM | POA: Diagnosis not present

## 2016-05-12 DIAGNOSIS — I2692 Saddle embolus of pulmonary artery without acute cor pulmonale: Secondary | ICD-10-CM | POA: Diagnosis not present

## 2016-05-12 NOTE — Progress Notes (Signed)
[place]  Telephone:([phone]) [phone] Fax:(336) [phone]     ID: [name] OB: [birth year]  MR#: [id number]  CNO#:[id number]  PCP: [name]., MD GYN:  [name] SU: [name] OTHER MD:  [name], [name] supple  CHIEF COMPLAINT: Early stage breast cancer  TREATMENT: Exemestane  BREAST CANCER HISTORY: From Dr [name]'s initial consult note 01/29/2013:  "[name] is a 75 y.o. female who is seen for an initial consultation visit. The patient has had multiple medical issues recently. By report the patient proceeded with a hysterectomy/BSO for endometrial cancer recently at [facility]. The patient did have some difficulty recovering from this and was in a skilled nursing facility for a short period of time. She then thought that she felt a possible mass within the left breast and she sought further evaluation for this. The patient proceeded with a mammogram and ultrasound which corresponded to suspicious findings.a core biopsy was obtained and this returned positive for invasive ductal carcinoma. Receptor studies have indicated that the tumor is ER positive, PR positive, and HER-2/neu negative. She then proceeded to undergo an MRI scan of the breasts bilaterally on 12/26/2012. This revealed a 1.9 cm irregular enhancing mass within the left breast at the 12:00 position. This corresponded to the recently diagnosed invasive mammary carcinoma.   The patient was seen by Dr. Donne Leon and she proceeded to perform a lumpectomy and sentinel lymph node evaluation on 01/11/2013. The patient's pathology returned positive for an invasive ductal carcinoma measuring 1.9 cm. The margins were negative with the closest margin being 1 cm. The sentinel lymph nodes were negative. This therefore represented a T1 C. N0 M0 tumor. "  Her subsequent history is as detailed below  INTERVAL HISTORY: [name] returns today for followup of her estrogen  receptor positive breast cancer. She is on exemestane, with good tolerance.  Hot flashes and vaginal dryness are not a major issue. She never developed the arthralgias or myalgias that many patients can experience on this medication. She obtains it at a good price.  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 She is not having any bleeding problems. She does tell me she has been following, and she went to see her neurologist Dr. Posey Leon. They obtained an MRI of her back which shows significant degenerative disease but no cancer. She is in a lot of pain and she takes hydrocodone for this. It does not constipate her. She is planning to celebrate [name] instead of Christmas this year, she says. She continues very deconditioned. A detailed review of systems today was otherwise stable . PAST MEDICAL HISTORY: Past Medical History:  Diagnosis Date  . Allergy   . Arthritis    "joints; mostly on the right side" (01/11/2013)  . Breast cancer ([place])    "left" (01/11/2013)  . Cancer [facility])    endometrial  . Chronic lower back pain   . Cold    just started 01/02/13 cough  . Complication of anesthesia    very phobic about needles.last surgery had to sedate before doing iv  . Depression   . Exertional shortness of breath   . Hyperlipidemia   . Hypertension   . Osteoporosis    knees   . Status post radiation therapy within last four weeks 03/19/13-05/03/13   lt breast 60.4GY  . Uterine cancer ([place])     PAST SURGICAL HISTORY: Past Surgical History:  Procedure Laterality Date  . ABDOMINAL HYSTERECTOMY  12/14/2012  .  BREAST BIOPSY Left 12/2012  . BREAST LUMPECTOMY WITH NEEDLE LOCALIZATION AND AXILLARY SENTINEL LYMPH NODE BX Left 01/11/2013  . BREAST LUMPECTOMY WITH NEEDLE LOCALIZATION AND AXILLARY SENTINEL LYMPH NODE BX Left 01/11/2013   Procedure: BREAST LUMPECTOMY WITH NEEDLE LOCALIZATION AND AXILLARY SENTINEL LYMPH NODE BX;  Surgeon: Sherry Bookbinder, MD;  Location: [facility];   Service: General;  Laterality: Left;  . CHOLECYSTECTOMY  1970  . DILATION AND CURETTAGE OF UTERUS     "@ least one" (01/11/2013)  . FOOT TENDON SURGERY Right   . KNEE ARTHROSCOPY Right 1990's   "twice" (01/11/2013)  . REPLACEMENT TOTAL KNEE Bilateral 2011-2012  . TUBAL LIGATION      FAMILY HISTORY No family history on file.  the patient does not know the cause of death of either parent. She was a foster child and really knows very little about her family. She has some half sisters that she is aware of, but does not know them very well.   GYNECOLOGIC HISTORY:   menarche age 81, first live birth age 63, the patient is [place] P2. She went through the change of life around the age of 84. She took hormone replacement through the women's health study. 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 She is divorced, and lives by herself, with no pets. Daughter [name] unfortunately died, sudden death, cause unclear. Son [name] lives in [place] and has a Designer, jewellery in psychology. The patient has no grandchildren. She is not a Ambulance person.    ADVANCED DIRECTIVES: Not in place but the patient intends to name her son as her healthcare power of attorney. He can be reached at [phone]. At the 10/10/2013 visit the patient was given the appropriate documents to complete and notarize at her discretion   HEALTH MAINTENANCE: Social History  Substance Use Topics  . Smoking status: Never Smoker  . Smokeless tobacco: Never Used  . Alcohol use No     Colonoscopy:  PAP: Status post hysterectomy and bilateral salpingo-oophorectomy   Bone density: April 2014, showed minimal osteopenia, with a T score of -1.1  Lipid panel:  Allergies  Allergen Reactions  . Iodine Rash    Current Outpatient Prescriptions  Medication Sig Dispense Refill  . apixaban (ELIQUIS) 5 MG TABS tablet Take 2 tablets (10 mg total) by mouth 2  (two) times daily. Last dose will be on July 19 4 tablet 0  . buPROPion (WELLBUTRIN SR) 150 MG 12 hr tablet Take 150 mg by mouth 2 (two) times daily.    . carisoprodol (SOMA) 350 MG tablet Take 350 mg by mouth every 6 (six) hours.     . Diclofenac Sodium (PENNSAID) 1.5 % SOLN Place 10 drops onto the skin 4 (four) times daily. For pain    . DULoxetine (CYMBALTA) 20 MG capsule Take 1 capsule (20 mg total) by mouth daily. 30 capsule 3  . exemestane (AROMASIN) 25 MG tablet Take 1 tablet (25 mg total) by mouth daily. 90 tablet 3  . fenofibrate 160 MG tablet Take 160 mg by mouth daily.    Marland Kitchen FIBER COMPLETE TABS Take 1 tablet by mouth daily.    Marland Kitchen FIBER PO Take by mouth.    . furosemide (LASIX) 20 MG tablet     . gabapentin (NEURONTIN) 300 MG capsule Take 1 capsule (300 mg total) by mouth at bedtime. 90 capsule 4  . HYDROcodone-acetaminophen (NORCO/VICODIN) 5-325 MG tablet Take 1 tablet  by mouth every 6 (six) hours as needed for moderate pain. 8 tablet 0  . Menthol-Methyl Salicylate (ICY HOT) 69-62 % STCK Apply 1 application topically daily as needed (for pain).    . metFORMIN (GLUCOPHAGE) 500 MG tablet Take 1 tablet (500 mg total) by mouth 2 (two) times daily with a meal. 60 tablet 0  . metoprolol succinate (TOPROL-XL) 50 MG 24 hr tablet Take 50 mg by mouth 2 (two) times daily.     . Multiple Vitamin (MULTIVITAMIN WITH MINERALS) TABS tablet Take 1 tablet by mouth daily.    . RESTASIS 0.05 % ophthalmic emulsion Place 1 drop into both eyes 2 (two) times daily.      No current facility-administered medications for this visit.     OBJECTIVE:morbidly obese African-American woman  Vitals:   05/12/16 1423  BP: 104/66  Pulse: (!) 109  Resp: (!) 21  Temp: 98.1 F (36.7 C)     Body mass index is 45.05 kg/m.    ECOG FS:2 - Symptomatic, <50% confined to bed  Sclerae unicteric, EOMs intact Oropharynx clear, Dry No cervical or supraclavicular adenopathy Lungs no rales or rhonchi Heart regular rate and  rhythm Abd soft, obese,nontender, positive bowel sounds MSK no focal spinal tenderness, mild bilateral ankle edema Neuro: nonfocal, well oriented, appropriate affect Breasts: the right breast is benign. The left breast is considerably smaller, and darker. It is status post lumpectomy and radiation. There is no evidence of disease recurrence. The left axilla is benign.    LAB RESULTS:  CMP     Component Value Date/Time   NA 137 12/08/2015 0533   NA 141 09/24/2015 1400   K 4.2 12/08/2015 0533   K 4.4 09/24/2015 1400   CL 105 12/08/2015 0533   CO2 28 12/08/2015 0533   CO2 26 09/24/2015 1400   GLUCOSE 135 (H) 12/08/2015 0533   GLUCOSE 173 (H) 09/24/2015 1400   BUN 15 12/08/2015 0533   BUN 20.3 09/24/2015 1400   CREATININE 1.07 (H) 12/08/2015 0533   CREATININE 1.2 (H) 09/24/2015 1400   CALCIUM 9.4 12/08/2015 0533   CALCIUM 10.1 09/24/2015 1400   PROT 7.4 09/24/2015 1400   ALBUMIN 3.6 09/24/2015 1400   AST 13 09/24/2015 1400   ALT 21 09/24/2015 1400   ALKPHOS 90 09/24/2015 1400   BILITOT 0.45 09/24/2015 1400   GFRNONAA 50 (L) 12/08/2015 0533   GFRAA 58 (L) 12/08/2015 0533    I No results found for: SPEP  Lab Results  Component Value Date   WBC 5.7 12/09/2015   NEUTROABS 5.6 11/29/2015   HGB 12.6 12/09/2015   HCT 41.2 12/09/2015   MCV 86.0 12/09/2015   PLT 275 12/09/2015      Chemistry      Component Value Date/Time   NA 137 12/08/2015 0533   NA 141 09/24/2015 1400   K 4.2 12/08/2015 0533   K 4.4 09/24/2015 1400   CL 105 12/08/2015 0533   CO2 28 12/08/2015 0533   CO2 26 09/24/2015 1400   BUN 15 12/08/2015 0533   BUN 20.3 09/24/2015 1400   CREATININE 1.07 (H) 12/08/2015 0533   CREATININE 1.2 (H) 09/24/2015 1400      Component Value Date/Time   CALCIUM 9.4 12/08/2015 0533   CALCIUM 10.1 09/24/2015 1400   ALKPHOS 90 09/24/2015 1400   AST 13 09/24/2015 1400   ALT 21 09/24/2015 1400   BILITOT 0.45 09/24/2015 1400       No results found for:  LABCA2  No components found for: LABCA125  No results for input(s): INR in the last 168 hours.  Urinalysis    Component Value Date/Time   COLORURINE AMBER BIOCHEMICALS MAY BE AFFECTED BY COLOR (A) 03/31/2010 1116   APPEARANCEUR CLOUDY (A) 03/31/2010 1116   LABSPEC 1.031 (H) 03/31/2010 1116   PHURINE 5.5 03/31/2010 1116   GLUCOSEU NEGATIVE 03/31/2010 1116   HGBUR NEGATIVE 03/31/2010 1116   [place] 03/31/2010 1116   KETONESUR NEGATIVE 03/31/2010 1116   PROTEINUR NEGATIVE 03/31/2010 1116   UROBILINOGEN 0.2 03/31/2010 1116   NITRITE NEGATIVE 03/31/2010 1116   LEUKOCYTESUR  03/31/2010 1116    NEGATIVE MICROSCOPIC NOT DONE ON URINES WITH NEGATIVE PROTEIN, BLOOD, LEUKOCYTES, NITRITE, OR GLUCOSE <1000 mg/dL.    STUDIES: Mr Lumbar Spine Wo Contrast  Result Date: 05/06/2016 CLINICAL DATA:  Initial evaluation for low back pain with right lower extremity pain, weakness, and numbness. EXAM: MRI LUMBAR SPINE WITHOUT CONTRAST TECHNIQUE: Multiplanar, multisequence MR imaging of the lumbar spine was performed. No intravenous contrast was administered. COMPARISON:  None available. FINDINGS: Segmentation: Normal segmentation. Lowest well-formed disc is labeled the L5-S1 level. Alignment: Trace anterolisthesis of L3 on L4 and L4 on L5. Vertebral bodies otherwise normally aligned with preservation of the normal lumbar lordosis. Vertebrae: Vertebral body heights are well maintained. No evidence for acute or chronic fracture. 2 cm T1/T2 hyperintense lesion within the L2 vertebral body most compatible with a benign hemangioma. No other focal osseous lesions. Signal intensity within the vertebral body bone marrow otherwise unremarkable and within normal limits. Conus medullaris: Extends to the L1 level and appears normal. Paraspinal and other soft tissues: Paraspinous soft tissues demonstrate no acute abnormality. Visualized visceral structures are unremarkable. Disc levels: T11-12: Diffuse disc  bulge with disc desiccation and intervertebral disc carious narrowing. Posterior element hypertrophy. No significant canal stenosis. Mild right foraminal narrowing. T12-L1: Mild diffuse disc bulge with disc desiccation. Disc bulging slightly eccentric to the left. Mild facet hypertrophy. No significant stenosis. L1-2: No significant disc bulge. Bilateral facet arthrosis with ligamentum flavum hypertrophy. Resultant mild left subarticular stenosis. No significant canal narrowing. No significant foraminal encroachment. L2-3: Diffuse degenerative disc bulge with disc desiccation and intervertebral disc space narrowing. Bilateral facet arthrosis with ligamentum flavum hypertrophy. Epidural lipomatosis. There is resultant severe canal and subarticular stenosis. Thecal sac measures 6 mm in AP diameter at its most narrow point. Moderate bilateral foraminal narrowing related to disc bulge, endplate osteophytic spurring, and facet disease. Additionally, right extraforaminal disc bulge and endplate osteophyte closely approximates the exiting right L2 nerve root (series 17, image 10). L3-4: Trace anterolisthesis of L3 on L4. Associated advanced bilateral facet arthrosis, right worse than left. Ligamentum flavum hypertrophy. There is resultant severe canal and subarticular stenosis bilaterally. Thecal sac measures 6 mm in AP diameter at its most narrow point. Mild bilateral foraminal narrowing slightly worse on the right. L4-5: Trace anterolisthesis of L4 on L5. Diffuse degenerative disc bulge. Advanced bilateral facet arthrosis, slightly worse on the right. There is not asymmetric reactive effusion within the right L4-5 facet. Epidural lipomatosis noted as well within the ventral epidural space. Resultant moderate canal and right subarticular stenosis. Mild to moderate right foraminal narrowing. Right far lateral disc bulge and/or endplate osteophytic spurring closely approximates the exiting right L4 nerve root (series 11,  image 23). L5-S1: Broad posterior disc protrusion, slightly eccentric to the left. Advanced bilateral facet arthrosis. Asymmetric reactive effusion within the right L4-5 facet. Protruding disc contacts the bilateral S1 nerve roots as they course through  the right lateral recesses (series 11, image 28). No significant canal narrowing. Mild bilateral foraminal stenosis. IMPRESSION: 1. Multifactorial degenerative changes at L2-3 and L3-4 with resultant severe canal and subarticular stenosis. 2. Multifactorial degenerative changes at L4-5 as above with resultant moderate canal and right subarticular stenosis. 3. Right far lateral disc bulge with endplate osteophytosis at L2-3 and L4-5, potentially affecting the transiting right L2 and L4 nerve roots respectively. 4. Broad posterior disc bulge at L5-S1, contacting the bilateral S1 nerve roots as they course through the lateral recesses. 5. Advanced multilevel facet arthropathy throughout the lumbar spine as above. Electronically Signed   By: Jeannine Boga M.D.   On: 05/06/2016 18:16     ASSESSMENT: 75 y.o. [place] woman status post left breast upper outer quadrant lumpectomy and sentinel lymph node sampling 01/11/2013 for a pT1c pN0, stage IA invasive ductal carcinoma, grade 1, estrogen receptor 100% positive, progesterone receptor 79% positive, with an MIB-1 of 17% and no HER-2 amplification.  (1) Oncotype DX score of 10 predicts an outside the breast risk of recurrence within 10 years of 7% if the patient's only systemic treatment is tamoxifen for 5 years. It also predicts no benefit from chemotherapy.  (2) the patient completed adjuvant radiation December 2014  (3) on anastrozole January through March of 2015, discontinued because of side effects  (4) exemestane started June 2015  (a) bone density 07/22/2012 was normal  (5) status post robotic hysterectomy with bilateral salpingo-oophorectomy 12/14/2012 for a noninvasive grade 1 endometrial  cancer   (6) saddle embolus diagnosed 11/29/2015, s/p thrombolysis followed by apixaban as of 12/01/2015.  PLAN: Sukaina is now 3-1/2 years out from definitive surgery for her breast cancer with no evidence of disease recurrence. This is very favorable.  She is tolerating exemestane well and the plan will be to continue that to a total of 5 years.  She has very restricted range of motion particularly in the left upper extremity. She requested in occupational therapy referral was planned to place for her.  She is going to have her next mammography in March. She will see me next April.  She knows to call for any problems that may develop before her next visit here.  Chauncey Cruel, MD   05/12/2016 2:45 PMTheodore of

## 2016-05-13 ENCOUNTER — Telehealth: Payer: Self-pay | Admitting: *Deleted

## 2016-05-13 NOTE — Telephone Encounter (Signed)
Pt states she did not get her schedule when she was here yesterday.  She requests calendar schedule mailed to her home address.  Informed pt of Mammogram in Feb and Lab/MD in April 2018.  Placed copies of her in outgoing mail.  Pt verbalized understanding.

## 2016-06-03 ENCOUNTER — Telehealth: Payer: Self-pay | Admitting: *Deleted

## 2016-06-03 NOTE — Telephone Encounter (Signed)
Sherese called from [facility] and they had to close [name] case because of insurance change with the new year and reopen under new insurance.  Will need to have new POC signed by Dr Posey Pronto.  It was 1wk1 2wk 8 and will continue with 2wk7.  Approval given.

## 2016-06-21 NOTE — Telephone Encounter (Signed)
Orders should have gone to Dr Jana Hakim or Donne Hazel.  We have not see this patient.  Dr D. Patel neurology has seen them but not Ankit Patel.  I have faxed orders back to [facility], and left a message for [facility] who called it to [place] [facility] to call back.

## 2016-06-22 ENCOUNTER — Telehealth: Payer: Self-pay | Admitting: *Deleted

## 2016-06-22 NOTE — Telephone Encounter (Signed)
I spoke with Cherise PT from [facility] and let her know that Mrs Nitzel has never been seen by Dr Delice Lesch or any of our rehab physicians or been on inpt rehab at [facility].  She may be thinking she was speaking with [facility] [place] office.  Orders have not been signed and sent back to [facility] yesterday.

## 2016-06-23 ENCOUNTER — Ambulatory Visit: Payer: Medicare Other | Admitting: Neurology

## 2016-07-05 ENCOUNTER — Telehealth: Payer: Self-pay

## 2016-07-05 NOTE — Telephone Encounter (Signed)
Pt called asking where her mm is being done. Called her back and let her know it is at [facility].

## 2016-07-08 ENCOUNTER — Ambulatory Visit
Admission: RE | Admit: 2016-07-08 | Discharge: 2016-07-08 | Disposition: A | Discharge status: Home / Self Care | Payer: Medicare Other | Source: Ambulatory Visit | Attending: Oncology | Admitting: Oncology

## 2016-07-08 DIAGNOSIS — Z170 Estrogen receptor positive status [ER+]: Principal | ICD-10-CM

## 2016-07-08 DIAGNOSIS — C50412 Malignant neoplasm of upper-outer quadrant of left female breast: Secondary | ICD-10-CM

## 2016-07-08 HISTORY — DX: Personal history of irradiation: Z92.3

## 2016-08-11 ENCOUNTER — Telehealth: Payer: Self-pay | Admitting: Oncology

## 2016-08-11 NOTE — Telephone Encounter (Signed)
lvm to inform pt of r/s 4/12 appt to 5/2 per GM on PAL

## 2016-08-17 ENCOUNTER — Ambulatory Visit: Payer: Medicare Other | Admitting: Oncology

## 2016-08-17 ENCOUNTER — Other Ambulatory Visit: Payer: Medicare Other

## 2016-09-02 ENCOUNTER — Ambulatory Visit: Payer: Medicare Other | Admitting: Oncology

## 2016-09-02 ENCOUNTER — Other Ambulatory Visit: Payer: Medicare Other

## 2016-09-21 ENCOUNTER — Other Ambulatory Visit: Payer: Self-pay | Admitting: Adult Health

## 2016-09-21 DIAGNOSIS — Z170 Estrogen receptor positive status [ER+]: Principal | ICD-10-CM

## 2016-09-21 DIAGNOSIS — C50412 Malignant neoplasm of upper-outer quadrant of left female breast: Secondary | ICD-10-CM

## 2016-09-22 ENCOUNTER — Ambulatory Visit: Payer: Self-pay | Admitting: Oncology

## 2016-09-22 ENCOUNTER — Other Ambulatory Visit: Payer: Medicare Other

## 2016-09-23 ENCOUNTER — Other Ambulatory Visit: Payer: Medicare Other

## 2016-09-23 ENCOUNTER — Ambulatory Visit: Payer: Self-pay | Admitting: Oncology

## 2016-10-04 ENCOUNTER — Other Ambulatory Visit: Payer: Medicare Other

## 2016-10-04 ENCOUNTER — Ambulatory Visit: Payer: Self-pay | Admitting: Oncology

## 2016-11-20 IMAGING — CT CT ANGIO CHEST
3 of 6 series · 12 of 36 positions shown · IV contrast (Omni 300)
Comparison: Chest x-ray from earlier today. CT scan of the chest
February 08, 2013

CLINICAL DATA: Cough and progressive shortness of breath.

EXAM:
CT ANGIOGRAPHY CHEST WITH CONTRAST
TECHNIQUE: Multidetector CT imaging of the chest was performed using the
standard protocol during bolus administration of intravenous
contrast. Multiplanar CT image reconstructions and MIPs were
obtained to evaluate the vascular anatomy.
CONTRAST:  80 mL of Isovue 370

[Series 4: pe 3mm · axial · 0.72mm/px · z∈[+1260,+1410]mm · 3 of 101 slices shown]
[im 26/101  lung]
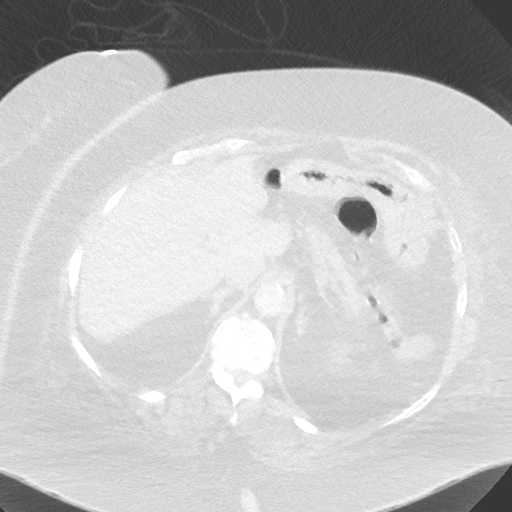
[im 51/101  mediastinal]
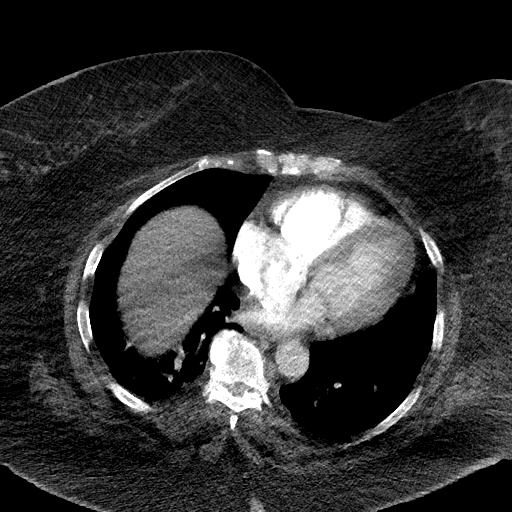
[im 76/101  lung]
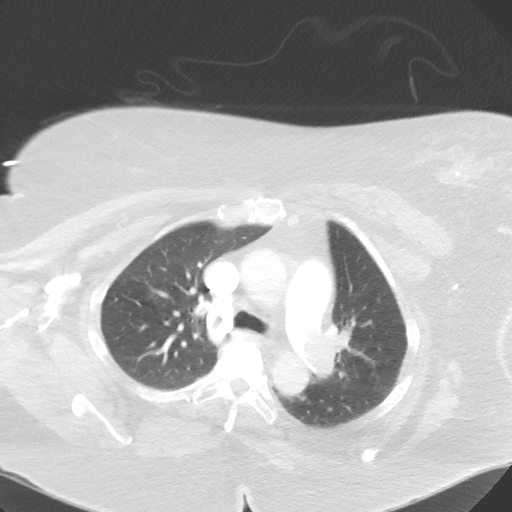

[Series 6: pe thins · axial · 0.72mm/px · z∈[+1212,+1458]mm · 8 of 429 slices shown]
[im 39/429  lung]
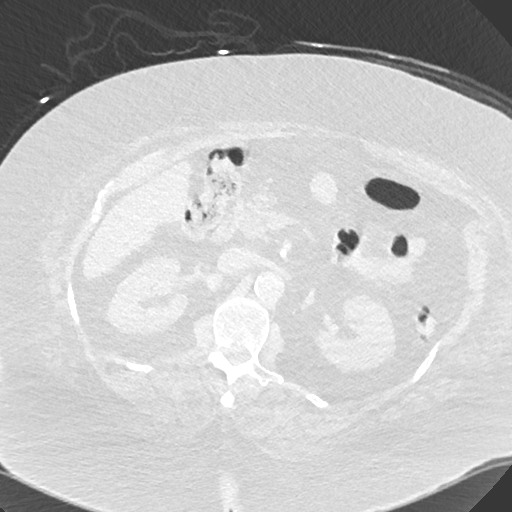
[im 78/429  lung]
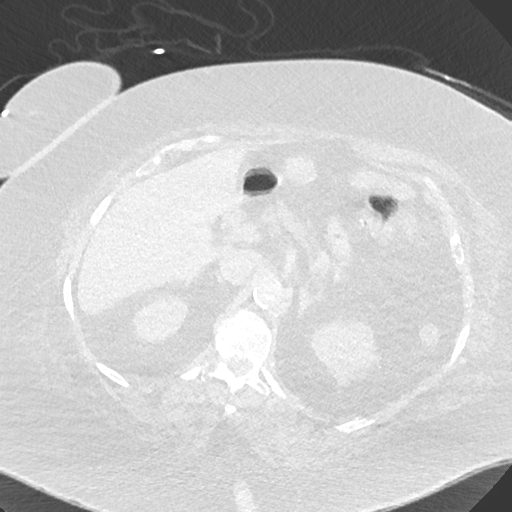
[im 137/429  lung]
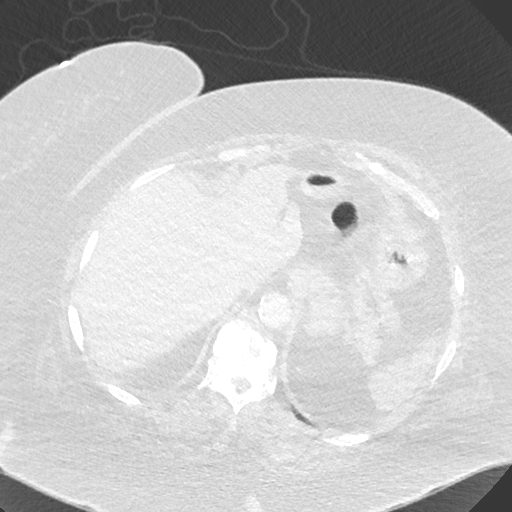
[im 195/429  lung]
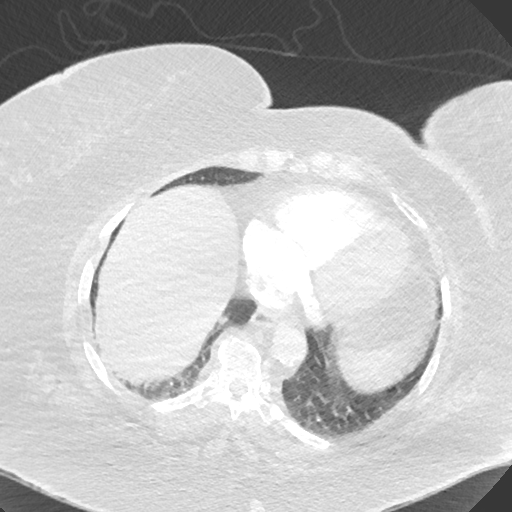
[im 234/429  lung]
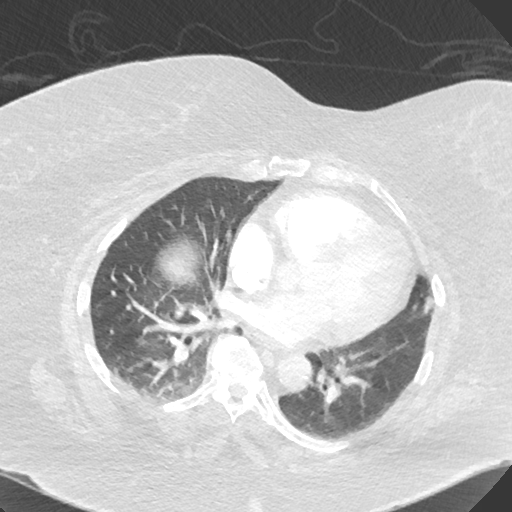
[im 292/429  lung]
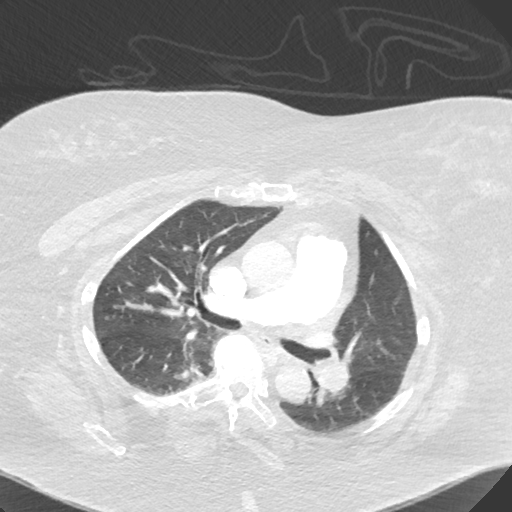
[im 351/429  lung]
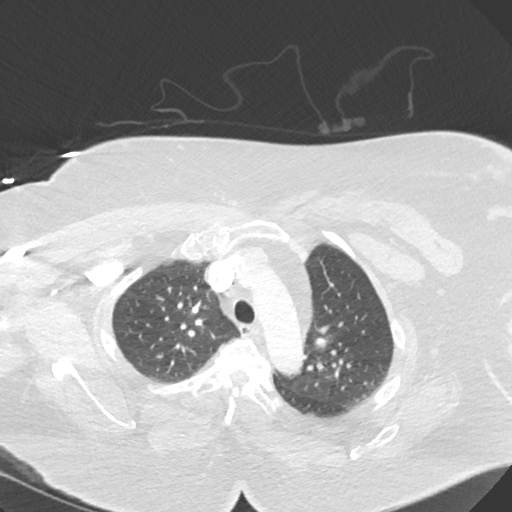
[im 390/429  lung]
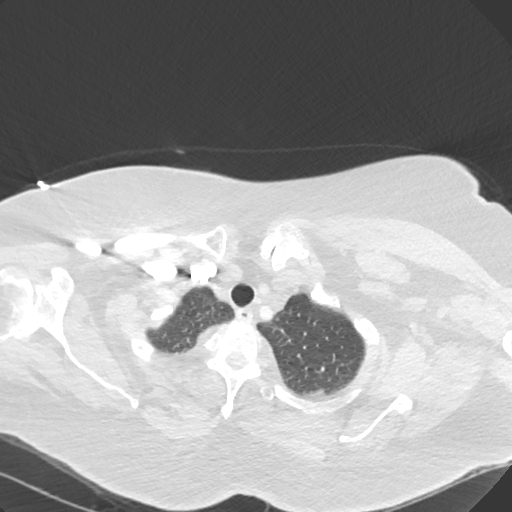

[Series 7: pe 2mm cor · coronal · 0.62mm/px · 1 of 101 slices shown]
[im 51/101  mediastinal]
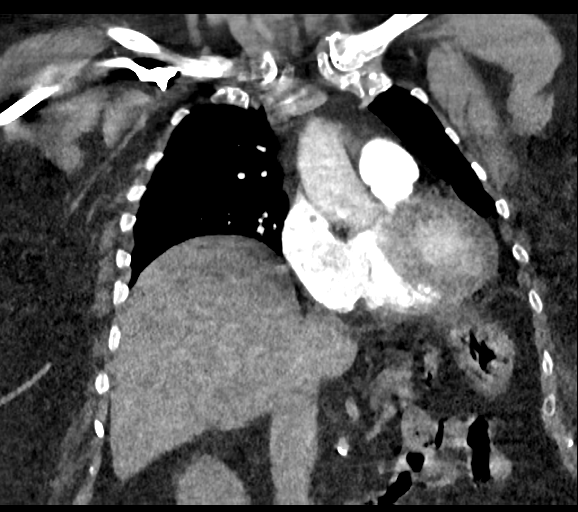

[12 of 36 positions shown; findings below may reference images not displayed]

FINDINGS: The central airways are within normal limits. No pneumothorax
identified. Scattered subsegmental atelectasis is identified. No
pulmonary nodules, masses, or focal infiltrates are identified.
Pulmonary emboli are identified. There is a large embolus in the
left main pulmonary artery with emboli seen in the left upper and
lower lobe branches. Pulmonary emboli are also seen on the right
involving right upper, middle, and lower lobe branches. Embolus
extends into the periphery of the right main pulmonary artery. The
aorta and main pulmonary artery are similar in caliber measuring
cm. There is no definitive flattening of the intraventricular septum
or deviation to the left. The right and left ventricles are similar
in size. There is no reflux of contrast into the inferior vena cava.
No effusions or adenopathy.

Evaluation of the upper abdomen is limited due to arterial phase
imaging with no acute abnormalities seen.

Visualized bones demonstrate degenerative changes in the spine but
no other abnormalities.

Review of the MIP images confirms the above findings.
IMPRESSION: 1. There is a large burden of bilateral pulmonary emboli.
Findings called to Dr. Rockg Cantave Gousse

## 2016-11-21 ENCOUNTER — Other Ambulatory Visit: Payer: Self-pay | Admitting: Oncology

## 2017-01-10 ENCOUNTER — Other Ambulatory Visit (HOSPITAL_BASED_OUTPATIENT_CLINIC_OR_DEPARTMENT_OTHER): Payer: Medicare Other

## 2017-01-10 ENCOUNTER — Ambulatory Visit (HOSPITAL_BASED_OUTPATIENT_CLINIC_OR_DEPARTMENT_OTHER): Payer: Medicare Other | Admitting: Oncology

## 2017-01-10 VITALS — BP 136/69 | HR 97 | Temp 98.4°F | Resp 18 | Ht 65.0 in | Wt 255.4 lb

## 2017-01-10 DIAGNOSIS — Z170 Estrogen receptor positive status [ER+]: Secondary | ICD-10-CM

## 2017-01-10 DIAGNOSIS — I2692 Saddle embolus of pulmonary artery without acute cor pulmonale: Secondary | ICD-10-CM | POA: Diagnosis not present

## 2017-01-10 DIAGNOSIS — C50412 Malignant neoplasm of upper-outer quadrant of left female breast: Secondary | ICD-10-CM

## 2017-01-10 DIAGNOSIS — Z79811 Long term (current) use of aromatase inhibitors: Secondary | ICD-10-CM | POA: Diagnosis not present

## 2017-01-10 DIAGNOSIS — Z7901 Long term (current) use of anticoagulants: Secondary | ICD-10-CM | POA: Diagnosis not present

## 2017-01-10 DIAGNOSIS — Z90710 Acquired absence of both cervix and uterus: Secondary | ICD-10-CM | POA: Diagnosis not present

## 2017-01-10 DIAGNOSIS — Z86008 Personal history of in-situ neoplasm of other site: Secondary | ICD-10-CM | POA: Diagnosis not present

## 2017-01-10 LAB — COMPREHENSIVE METABOLIC PANEL
ALT: 14 U/L (ref 0–55)
AST: 15 U/L (ref 5–34)
Albumin: 3.7 g/dL (ref 3.5–5.0)
Alkaline Phosphatase: 61 U/L (ref 40–150)
Anion Gap: 9 mEq/L (ref 3–11)
BUN: 26 mg/dL (ref 7.0–26.0)
CO2: 25 meq/L (ref 22–29)
Calcium: 10.8 mg/dL — ABNORMAL HIGH (ref 8.4–10.4)
Chloride: 106 mEq/L (ref 98–109)
Creatinine: 1.5 mg/dL — ABNORMAL HIGH (ref 0.6–1.1)
EGFR: 41 mL/min/{1.73_m2} — AB (ref >90)
GLUCOSE: 116 mg/dL (ref 70–140)
POTASSIUM: 4.6 meq/L (ref 3.5–5.1)
SODIUM: 140 meq/L (ref 136–145)
Total Bilirubin: 0.5 mg/dL (ref 0.20–1.20)
Total Protein: 7.4 g/dL (ref 6.4–8.3)

## 2017-01-10 LAB — CBC WITH DIFFERENTIAL/PLATELET
BASO%: 0.3 % (ref 0.0–2.0)
BASOS ABS: 0 10*3/uL (ref 0.0–0.1)
EOS ABS: 0.1 10*3/uL (ref 0.0–0.5)
EOS%: 1.4 % (ref 0.0–7.0)
HCT: 41.7 % (ref 34.8–46.6)
HGB: 13.3 g/dL (ref 11.6–15.9)
LYMPH%: 23 % (ref 14.0–49.7)
MCH: 27.1 pg (ref 25.1–34.0)
MCHC: 31.9 g/dL (ref 31.5–36.0)
MCV: 84.9 fL (ref 79.5–101.0)
MONO#: 0.7 10*3/uL (ref 0.1–0.9)
MONO%: 11.2 % (ref 0.0–14.0)
NEUT#: 4.1 10*3/uL (ref 1.5–6.5)
NEUT%: 64.1 % (ref 38.4–76.8)
Platelets: 238 10*3/uL (ref 145–400)
RBC: 4.91 10*6/uL (ref 3.70–5.45)
RDW: 16.2 % — ABNORMAL HIGH (ref 11.2–14.5)
WBC: 6.4 10*3/uL (ref 3.9–10.3)
lymph#: 1.5 10*3/uL (ref 0.9–3.3)

## 2017-01-10 MED ORDER — GABAPENTIN 300 MG PO CAPS: 300.0000 mg | ORAL_CAPSULE | Freq: Every day | ORAL | 4 refills | Status: DC

## 2017-01-10 MED ORDER — EXEMESTANE 25 MG PO TABS: 25.0000 mg | ORAL_TABLET | Freq: Every day | ORAL | 3 refills | Status: DC

## 2017-01-10 NOTE — Progress Notes (Signed)
[place]  [phone] [phone]     ID: ARDYCE HEYER OB: February 03, 1941  MR#: [id number]  TFT#:[id number]  PCP: Willey Blade, MD GYN:  Genia Del SU: Rolm Bookbinder OTHER MD:  Kyung Rudd, Lennette Bihari supple  CHIEF COMPLAINT: Early stage breast cancer  TREATMENT: Exemestane  BREAST CANCER HISTORY: From Dr Jenny Reichmann Moody's initial consult note 01/29/2013:  "CHRISTAL LAGERSTROM is a 76 y.o. female who is seen for an initial consultation visit. The patient has had multiple medical issues recently. By report the patient proceeded with a hysterectomy/BSO for endometrial cancer recently at [facility]. The patient did have some difficulty recovering from this and was in a skilled nursing facility for a short period of time. She then thought that she felt a possible mass within the left breast and she sought further evaluation for this. The patient proceeded with a mammogram and ultrasound which corresponded to suspicious findings.a core biopsy was obtained and this returned positive for invasive ductal carcinoma. Receptor studies have indicated that the tumor is ER positive, PR positive, and HER-2/neu negative. She then proceeded to undergo an MRI scan of the breasts bilaterally on 12/26/2012. This revealed a 1.9 cm irregular enhancing mass within the left breast at the 12:00 position. This corresponded to the recently diagnosed invasive mammary carcinoma.   The patient was seen by Dr. Donne Hazel and she proceeded to perform a lumpectomy and sentinel lymph node evaluation on 01/11/2013. The patient's pathology returned positive for an invasive ductal carcinoma measuring 1.9 cm. The margins were negative with the closest margin being 1 cm. The sentinel lymph nodes were negative. This therefore represented a T1 C. N0 M0 tumor. "  Her subsequent history is as detailed below  INTERVAL HISTORY: Shanequa returns today for follow-up and treatment of her  estrogen receptor positive breast cancer. Interval history is significant for her being followed by cardiology and strongly encouraged to lose weight. She actually has managed to do that. She has changed her diet. What she has not been able to do is start an exercise program  As far as the exemestane is concerned, she has no significant side effects from this. She obtains it at a good price.  REVIEW OF SYSTEMS: Precilla has significant arthritis discomfort. She is very limited in her functional status. She does have a chair walker which is useful. She tells me she had bilateral cataract surgery and that the one on the left is not working as well as she would like. Aside from these issues a detailed review of systems today was stable  PAST MEDICAL HISTORY: Past Medical History:  Diagnosis Date  . Allergy   . Arthritis    "joints; mostly on the right side" (01/11/2013)  . Breast cancer ([place])    "left" (01/11/2013)  . Cancer [facility])    endometrial  . Chronic lower back pain   . Cold    just started 01/02/13 cough  . Complication of anesthesia    very phobic about needles.last surgery had to sedate before doing iv  . Depression   . Exertional shortness of breath   . Hyperlipidemia   . Hypertension   . Osteoporosis    knees   . Personal history of radiation therapy 03/2013   left  . Status post radiation therapy within last four weeks 03/19/13-05/03/13   lt breast 60.4GY  . Uterine cancer ([place])     PAST SURGICAL HISTORY: Past Surgical History:  Procedure Laterality Date  . ABDOMINAL HYSTERECTOMY  12/14/2012  . BREAST BIOPSY Left 12/2012  . BREAST LUMPECTOMY WITH NEEDLE LOCALIZATION AND AXILLARY SENTINEL LYMPH NODE BX Left 01/11/2013  . BREAST LUMPECTOMY WITH NEEDLE LOCALIZATION AND AXILLARY SENTINEL LYMPH NODE BX Left 01/11/2013   Procedure: BREAST LUMPECTOMY WITH NEEDLE LOCALIZATION AND AXILLARY SENTINEL LYMPH NODE BX;  Surgeon: Rolm Bookbinder, MD;  Location: [facility];  Service: General;   Laterality: Left;  . CHOLECYSTECTOMY  1970  . DILATION AND CURETTAGE OF UTERUS     "@ least one" (01/11/2013)  . FOOT TENDON SURGERY Right   . KNEE ARTHROSCOPY Right 1990's   "twice" (01/11/2013)  . REPLACEMENT TOTAL KNEE Bilateral 2011-2012  . TUBAL LIGATION      FAMILY HISTORY No family history on file.  the patient does not know the cause of death of either parent. She was a foster child and really knows very little about her family. She has some half sisters that she is aware of, but does not know them very well.   GYNECOLOGIC HISTORY:   menarche age 65, first live birth age 51, the patient is [place] P2. She went through the change of life around the age of 44. She took hormone replacement through the women's health study. 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 She is divorced, and lives by herself, with no pets. Daughter [name] unfortunately died, sudden death, cause unclear. Son [name] lives in [place] and has a Designer, jewellery in psychology. The patient has no grandchildren. She is not a Ambulance person.    ADVANCED DIRECTIVES: Not in place but the patient intends to name her son as her healthcare power of attorney. He can be reached at [phone]. At the 10/10/2013 visit the patient was given the appropriate documents to complete and notarize at her discretion   HEALTH MAINTENANCE: Social History  Substance Use Topics  . Smoking status: Never Smoker  . Smokeless tobacco: Never Used  . Alcohol use No     Colonoscopy:  PAP: Status post hysterectomy and bilateral salpingo-oophorectomy   Bone density: April 2014, showed minimal osteopenia, with a T score of -1.1  Lipid panel:  Allergies  Allergen Reactions  . Iodine Rash    Current Outpatient Prescriptions  Medication Sig Dispense Refill  . apixaban (ELIQUIS) 5 MG TABS tablet Take 2 tablets (10 mg total) by mouth 2 (two) times daily. Last  dose will be on July 19 4 tablet 0  . buPROPion (WELLBUTRIN SR) 150 MG 12 hr tablet Take 150 mg by mouth 2 (two) times daily.    . carisoprodol (SOMA) 350 MG tablet Take 350 mg by mouth every 6 (six) hours.     . Diclofenac Sodium (PENNSAID) 1.5 % SOLN Place 10 drops onto the skin 4 (four) times daily. For pain    . DULoxetine (CYMBALTA) 20 MG capsule Take 1 capsule (20 mg total) by mouth daily. 30 capsule 3  . exemestane (AROMASIN) 25 MG tablet Take 1 tablet (25 mg total) by mouth daily. 90 tablet 3  . fenofibrate 160 MG tablet Take 160 mg by mouth daily.    Marland Kitchen FIBER COMPLETE TABS Take 1 tablet by mouth daily.    . furosemide (LASIX) 20 MG tablet     . gabapentin (NEURONTIN) 300 MG capsule Take 1 capsule (300 mg total) by mouth at bedtime. 90 capsule 4  . HYDROcodone-acetaminophen (NORCO/VICODIN) 5-325 MG tablet Take 1 tablet by mouth every 6 (six) hours  as needed for moderate pain. 8 tablet 0  . Menthol-Methyl Salicylate (ICY HOT) 58-85 % STCK Apply 1 application topically daily as needed (for pain).    . metFORMIN (GLUCOPHAGE) 500 MG tablet Take 1 tablet (500 mg total) by mouth 2 (two) times daily with a meal. 60 tablet 0  . metoprolol succinate (TOPROL-XL) 50 MG 24 hr tablet Take 50 mg by mouth 2 (two) times daily.     . Multiple Vitamin (MULTIVITAMIN WITH MINERALS) TABS tablet Take 1 tablet by mouth daily.     No current facility-administered medications for this visit.     OBJECTIVE:morbidly obese African-American woman Who appears stated age  89:   01/10/17 1120  BP: 136/69  Pulse: 97  Resp: 18  Temp: 98.4 F (36.9 C)  SpO2: 97%     Body mass index is 42.5 kg/m.    ECOG FS:2 - Symptomatic, <50% confined to bed  Sclerae unicteric, pupils round and equal Oropharynx clear and moist No cervical or supraclavicular adenopathy Lungs no rales or rhonchi Heart regular rate and rhythm Abd soft, obese, nontender, positive bowel sounds MSK scoliosis but no focal spinal tenderness,  no upper extremity lymphedema Neuro: nonfocal, well oriented, appropriate affect Breasts: The right breast is unremarkable. The left breast is status post lumpectomy followed by radiation with no evidence of local recurrence. Both axillae are benign.  LAB RESULTS:  CMP     Component Value Date/Time   NA 140 01/10/2017 1046   K 4.6 01/10/2017 1046   CL 105 12/08/2015 0533   CO2 25 01/10/2017 1046   GLUCOSE 116 01/10/2017 1046   BUN 26.0 01/10/2017 1046   CREATININE 1.5 (H) 01/10/2017 1046   CALCIUM 10.8 (H) 01/10/2017 1046   PROT 7.4 01/10/2017 1046   ALBUMIN 3.7 01/10/2017 1046   AST 15 01/10/2017 1046   ALT 14 01/10/2017 1046   ALKPHOS 61 01/10/2017 1046   BILITOT 0.50 01/10/2017 1046   GFRNONAA 50 (L) 12/08/2015 0533   GFRAA 58 (L) 12/08/2015 0533    I No results found for: SPEP  Lab Results  Component Value Date   WBC 6.4 01/10/2017   NEUTROABS 4.1 01/10/2017   HGB 13.3 01/10/2017   HCT 41.7 01/10/2017   MCV 84.9 01/10/2017   PLT 238 01/10/2017      Chemistry      Component Value Date/Time   NA 140 01/10/2017 1046   K 4.6 01/10/2017 1046   CL 105 12/08/2015 0533   CO2 25 01/10/2017 1046   BUN 26.0 01/10/2017 1046   CREATININE 1.5 (H) 01/10/2017 1046      Component Value Date/Time   CALCIUM 10.8 (H) 01/10/2017 1046   ALKPHOS 61 01/10/2017 1046   AST 15 01/10/2017 1046   ALT 14 01/10/2017 1046   BILITOT 0.50 01/10/2017 1046       No results found for: LABCA2  No components found for: LABCA125  No results for input(s): INR in the last 168 hours.  Urinalysis    Component Value Date/Time   COLORURINE AMBER BIOCHEMICALS MAY BE AFFECTED BY COLOR (A) 03/31/2010 1116   APPEARANCEUR CLOUDY (A) 03/31/2010 1116   LABSPEC 1.031 (H) 03/31/2010 1116   PHURINE 5.5 03/31/2010 [place] 03/31/2010 1116   HGBUR NEGATIVE 03/31/2010 [place] 03/31/2010 [place] 03/31/2010 1116   PROTEINUR NEGATIVE 03/31/2010  1116   UROBILINOGEN 0.2 03/31/2010 1116   NITRITE NEGATIVE 03/31/2010 1116   LEUKOCYTESUR  03/31/2010  1116    NEGATIVE MICROSCOPIC NOT DONE ON URINES WITH NEGATIVE PROTEIN, BLOOD, LEUKOCYTES, NITRITE, OR GLUCOSE <1000 mg/dL.    STUDIES: Bilateral diagnostic mammography with tomography 07/08/2016 facet breast density to be category C. There was no evidence of malignancy.  ASSESSMENT: 76 y.o. [place] woman status post left breast upper outer quadrant lumpectomy and sentinel lymph node sampling 01/11/2013 for a pT1c pN0, stage IA invasive ductal carcinoma, grade 1, estrogen receptor 100% positive, progesterone receptor 79% positive, with an MIB-1 of 17% and no HER-2 amplification.  (1) Oncotype DX score of 10 predicts an outside the breast risk of recurrence within 10 years of 7% if the patient's only systemic treatment is tamoxifen for 5 years. It also predicts no benefit from chemotherapy.  (2) the patient completed adjuvant radiation December 2014  (3) on anastrozole January through March of 2015, discontinued because of side effects  (4) exemestane started June 2015  (a) bone density 07/22/2012 was normal  (5) status post robotic hysterectomy with bilateral salpingo-oophorectomy 12/14/2012 for a noninvasive grade 1 endometrial cancer   (6) saddle embolus diagnosed 11/29/2015, s/p thrombolysis followed by apixaban as of 12/01/2015.  PLAN: Deyjah is now 4 years out from definitive surgery for her breast cancer with no evidence of disease recurrence. This is very favorable.  She is tolerating exemestane well and the plan will be to continue that to March 2020  She will have her next mammogram February 2019. I will see her again in March.  I encouraged her to continue her diet. If she can find her water aerobics program that would be great for her as well. She is appropriately concerned about her cardiac issues and is interested in modifying the risk factors.  She knows to call for  any problems that may develop before her next visit here.  Chauncey Cruel, MD   01/10/2017 11:35 AM

## 2017-02-17 ENCOUNTER — Other Ambulatory Visit: Payer: Self-pay

## 2017-03-03 ENCOUNTER — Other Ambulatory Visit: Payer: Self-pay

## 2017-03-03 MED ORDER — GABAPENTIN 300 MG PO CAPS: 300.0000 mg | ORAL_CAPSULE | Freq: Every day | ORAL | 4 refills | Status: DC

## 2017-03-03 MED ORDER — EXEMESTANE 25 MG PO TABS: 25.0000 mg | ORAL_TABLET | Freq: Every day | ORAL | 3 refills | Status: DC

## 2017-05-08 ENCOUNTER — Other Ambulatory Visit: Payer: Self-pay

## 2017-05-08 ENCOUNTER — Emergency Department (HOSPITAL_COMMUNITY): Payer: Medicare Other

## 2017-05-08 ENCOUNTER — Emergency Department (HOSPITAL_COMMUNITY)
Admission: EM | Admit: 2017-05-08 | Discharge: 2017-05-09 | Disposition: A | Discharge status: Home / Self Care | Payer: Medicare Other | Attending: Emergency Medicine | Admitting: Emergency Medicine

## 2017-05-08 ENCOUNTER — Encounter (HOSPITAL_COMMUNITY): Payer: Self-pay | Admitting: Emergency Medicine

## 2017-05-08 DIAGNOSIS — W010XXA Fall on same level from slipping, tripping and stumbling without subsequent striking against object, initial encounter: Secondary | ICD-10-CM | POA: Insufficient documentation

## 2017-05-08 DIAGNOSIS — Z7984 Long term (current) use of oral hypoglycemic drugs: Secondary | ICD-10-CM | POA: Insufficient documentation

## 2017-05-08 DIAGNOSIS — Y92002 Bathroom of unspecified non-institutional (private) residence single-family (private) house as the place of occurrence of the external cause: Secondary | ICD-10-CM | POA: Insufficient documentation

## 2017-05-08 DIAGNOSIS — Z96653 Presence of artificial knee joint, bilateral: Secondary | ICD-10-CM | POA: Diagnosis not present

## 2017-05-08 DIAGNOSIS — S4992XA Unspecified injury of left shoulder and upper arm, initial encounter: Secondary | ICD-10-CM | POA: Diagnosis present

## 2017-05-08 DIAGNOSIS — I129 Hypertensive chronic kidney disease with stage 1 through stage 4 chronic kidney disease, or unspecified chronic kidney disease: Secondary | ICD-10-CM | POA: Insufficient documentation

## 2017-05-08 DIAGNOSIS — M25561 Pain in right knee: Secondary | ICD-10-CM | POA: Diagnosis not present

## 2017-05-08 DIAGNOSIS — M25562 Pain in left knee: Secondary | ICD-10-CM | POA: Insufficient documentation

## 2017-05-08 DIAGNOSIS — Z853 Personal history of malignant neoplasm of breast: Secondary | ICD-10-CM | POA: Diagnosis not present

## 2017-05-08 DIAGNOSIS — E1122 Type 2 diabetes mellitus with diabetic chronic kidney disease: Secondary | ICD-10-CM | POA: Insufficient documentation

## 2017-05-08 DIAGNOSIS — N181 Chronic kidney disease, stage 1: Secondary | ICD-10-CM | POA: Diagnosis not present

## 2017-05-08 DIAGNOSIS — Y999 Unspecified external cause status: Secondary | ICD-10-CM | POA: Insufficient documentation

## 2017-05-08 DIAGNOSIS — Z8542 Personal history of malignant neoplasm of other parts of uterus: Secondary | ICD-10-CM | POA: Insufficient documentation

## 2017-05-08 DIAGNOSIS — Z7901 Long term (current) use of anticoagulants: Secondary | ICD-10-CM | POA: Diagnosis not present

## 2017-05-08 DIAGNOSIS — Y939 Activity, unspecified: Secondary | ICD-10-CM | POA: Insufficient documentation

## 2017-05-08 DIAGNOSIS — W19XXXA Unspecified fall, initial encounter: Secondary | ICD-10-CM

## 2017-05-08 MED ORDER — METOPROLOL SUCCINATE ER 25 MG PO TB24
50.0000 mg | ORAL_TABLET | Freq: Two times a day (BID) | ORAL | Status: DC
  Administered 2017-05-08: 50 mg via ORAL
  Filled 2017-05-08: qty 2

## 2017-05-08 MED ORDER — DULOXETINE HCL 20 MG PO CPEP
20.0000 mg | ORAL_CAPSULE | Freq: Every day | ORAL | Status: DC
  Administered 2017-05-08: 20 mg via ORAL
  Filled 2017-05-08: qty 1

## 2017-05-08 MED ORDER — APIXABAN 5 MG PO TABS
10.0000 mg | ORAL_TABLET | Freq: Two times a day (BID) | ORAL | Status: DC
  Administered 2017-05-08: 10 mg via ORAL
  Filled 2017-05-08: qty 2

## 2017-05-08 MED ORDER — TRAMADOL HCL 50 MG PO TABS
50.0000 mg | ORAL_TABLET | Freq: Once | ORAL | Status: AC
  Administered 2017-05-08: 50 mg via ORAL
  Filled 2017-05-08: qty 1

## 2017-05-08 MED ORDER — DOCUSATE SODIUM 100 MG PO CAPS
100.0000 mg | ORAL_CAPSULE | Freq: Every day | ORAL | Status: DC
  Administered 2017-05-08: 100 mg via ORAL
  Filled 2017-05-08: qty 1

## 2017-05-08 MED ORDER — FENOFIBRATE 160 MG PO TABS
160.0000 mg | ORAL_TABLET | Freq: Every day | ORAL | Status: DC
  Administered 2017-05-08: 160 mg via ORAL
  Filled 2017-05-08 (×2): qty 1

## 2017-05-08 MED ORDER — METFORMIN HCL 500 MG PO TABS: 500.0000 mg | ORAL_TABLET | Freq: Two times a day (BID) | ORAL | Status: DC

## 2017-05-08 MED ORDER — TRAMADOL HCL 50 MG PO TABS: 50.0000 mg | ORAL_TABLET | Freq: Four times a day (QID) | ORAL | 0 refills | Status: DC | PRN

## 2017-05-08 MED ORDER — HYDROCODONE-ACETAMINOPHEN 5-325 MG PO TABS
1.0000 | ORAL_TABLET | Freq: Four times a day (QID) | ORAL | Status: DC | PRN
  Administered 2017-05-08: 1 via ORAL
  Filled 2017-05-08: qty 1

## 2017-05-08 MED ORDER — GABAPENTIN 300 MG PO CAPS
300.0000 mg | ORAL_CAPSULE | Freq: Every day | ORAL | Status: DC
  Administered 2017-05-08: 300 mg via ORAL
  Filled 2017-05-08: qty 1

## 2017-05-08 MED ORDER — FUROSEMIDE 20 MG PO TABS
20.0000 mg | ORAL_TABLET | Freq: Every day | ORAL | Status: DC
  Administered 2017-05-08: 20 mg via ORAL
  Filled 2017-05-08: qty 1

## 2017-05-08 MED ORDER — BUPROPION HCL ER (SR) 150 MG PO TB12
150.0000 mg | ORAL_TABLET | Freq: Two times a day (BID) | ORAL | Status: DC
  Administered 2017-05-08: 150 mg via ORAL
  Filled 2017-05-08 (×2): qty 1

## 2017-05-08 MED ORDER — EXEMESTANE 25 MG PO TABS
25.0000 mg | ORAL_TABLET | Freq: Every day | ORAL | Status: DC
  Administered 2017-05-08: 25 mg via ORAL
  Filled 2017-05-08 (×2): qty 1

## 2017-05-08 NOTE — ED Notes (Signed)
ED Provider at bedside. 

## 2017-05-08 NOTE — ED Notes (Signed)
Patient transported to X-ray 

## 2017-05-08 NOTE — ED Provider Notes (Signed)
Nurses report that the patient will be boarding overnight due to waiting for social services consult for physical therapy and home health needs after fall.  Patient had been discharged.  Nurse requests ordering of patient's home medications.  I have reviewed the patient's past medical history by EMR.  I have personally reviewed the patient's medications with her while ordering.  All medications ordered are per the patient's endorsement of home medications.  Vicodin was on the list, patient reports she has taken that for pain without adverse effect.  Will use for pain control for muscular skeletal pain related to fall.  Patient is alert, appropriate and interactive.  No acute distress.  No respiratory distress.  Mental status clear.      Charlesetta Shanks, MD 05/08/17 2104

## 2017-05-08 NOTE — Discharge Instructions (Signed)
As discussed, your evaluation today has been largely reassuring.  But, it is important that you monitor your condition carefully, and do not hesitate to return to the ED if you develop new, or concerning changes in your condition.  Otherwise, please follow-up with your physician for appropriate ongoing care.  Our home health services should contact you in the coming days for an assessment.

## 2017-05-08 NOTE — ED Provider Notes (Signed)
[facility] EMERGENCY DEPARTMENT Provider Note   CSN: [id number] Arrival date & time: 05/08/17  1333     History   Chief Complaint Chief Complaint  Patient presents with  . Fall    HPI JAYMIE MCKIDDY is a 76 y.o. female.  HPI Patient presents after mechanical fall that occurred just prior to ED arrival. Patient was in the bathroom, stumbled, falling between a sink and a wall. Since the fall she has had pain in her left shoulder, both knees. Patient is amatory with a walker, minimally at baseline. She denies head trauma, loss of consciousness, confusion, dissertation, weakness in her extremities. No chest pain, no dyspnea, no medication taken for pain relief.  Past Medical History:  Diagnosis Date  . Allergy   . Arthritis    "joints; mostly on the right side" (01/11/2013)  . Breast cancer ([place])    "left" (01/11/2013)  . Cancer [facility])    endometrial  . Chronic lower back pain   . Cold    just started 01/02/13 cough  . Complication of anesthesia    very phobic about needles.last surgery had to sedate before doing iv  . Depression   . Exertional shortness of breath   . Hyperlipidemia   . Hypertension   . Osteoporosis    knees   . Personal history of radiation therapy 03/2013   left  . Status post radiation therapy within last four weeks 03/19/13-05/03/13   lt breast 60.4GY  . Uterine cancer [facility])     Patient Active Problem List   Diagnosis Date Noted  . Pulmonary embolus ([place])   . Pulmonary embolus, left ([place])   . Pulmonary embolus, right ([place])   . CKD (chronic kidney disease), stage I   . Bilateral pulmonary embolism ([place])   . PE (pulmonary embolism)   . Acute kidney injury superimposed on chronic kidney disease ([place]) 11/30/2015  . Depression 11/30/2015  . Essential hypertension 11/30/2015  . Acute respiratory failure with hypoxia ([place]) 11/30/2015  . Diabetes mellitus type 2 in obese ([place]) 11/30/2015  . Pulmonary embolism ([place])  11/29/2015  . Pulmonary embolism, bilateral ([place]) 11/29/2015  . Pain in lower limb 01/10/2015  . Malignant neoplasm of upper-outer quadrant of left breast in female, estrogen receptor positive ([place]) 01/24/2013  . Edema 01/09/2013  . Onychomycosis 01/09/2013  . Pain in joint, ankle and foot 01/09/2013    Past Surgical History:  Procedure Laterality Date  . ABDOMINAL HYSTERECTOMY  12/14/2012  . BREAST BIOPSY Left 12/2012  . BREAST LUMPECTOMY WITH NEEDLE LOCALIZATION AND AXILLARY SENTINEL LYMPH NODE BX Left 01/11/2013  . BREAST LUMPECTOMY WITH NEEDLE LOCALIZATION AND AXILLARY SENTINEL LYMPH NODE BX Left 01/11/2013   Procedure: BREAST LUMPECTOMY WITH NEEDLE LOCALIZATION AND AXILLARY SENTINEL LYMPH NODE BX;  Surgeon: Rolm Bookbinder, MD;  Location: [facility];  Service: General;  Laterality: Left;  . CHOLECYSTECTOMY  1970  . DILATION AND CURETTAGE OF UTERUS     "@ least one" (01/11/2013)  . FOOT TENDON SURGERY Right   . KNEE ARTHROSCOPY Right 1990's   "twice" (01/11/2013)  . REPLACEMENT TOTAL KNEE Bilateral 2011-2012  . TUBAL LIGATION      OB History    No data available       Home Medications    Prior to Admission medications   Medication Sig Start Date End Date Taking? Authorizing Provider  apixaban (ELIQUIS) 5 MG TABS tablet Take 2 tablets (10 mg total) by mouth 2 (two) times daily. Last dose will be  on July 19 12/09/15   [name], MD  buPROPion [facility] SR) 150 MG 12 hr tablet Take 150 mg by mouth 2 (two) times daily. 11/11/15   [provider]  carisoprodol (SOMA) 350 MG tablet Take 350 mg by mouth every 6 (six) hours.  12/15/12   [provider]  Diclofenac Sodium (PENNSAID) 1.5 % SOLN Place 10 drops onto the skin 4 (four) times daily. For pain    [provider]  DULoxetine (CYMBALTA) 20 MG capsule Take 1 capsule (20 mg total) by mouth daily. 08/17/13   Gardenia Phlegm, NP  exemestane (AROMASIN) 25 MG tablet Take 1 tablet (25 mg total) by  mouth daily. 03/03/17   Magrinat, Virgie Dad, MD  fenofibrate 160 MG tablet Take 160 mg by mouth daily. 10/02/15   [provider]  FIBER COMPLETE TABS Take 1 tablet by mouth daily.    [provider]  furosemide (LASIX) 20 MG tablet  03/22/16   [provider]  gabapentin (NEURONTIN) 300 MG capsule Take 1 capsule (300 mg total) by mouth at bedtime. 03/03/17   Magrinat, Virgie Dad, MD  HYDROcodone-acetaminophen (NORCO/VICODIN) 5-325 MG tablet Take 1 tablet by mouth every 6 (six) hours as needed for moderate pain. 12/09/15   [name], MD  Menthol-Methyl Salicylate (ICY HOT) 36-14 % STCK Apply 1 application topically daily as needed (for pain).    [provider]  metFORMIN (GLUCOPHAGE) 500 MG tablet Take 1 tablet (500 mg total) by mouth 2 (two) times daily with a meal. 12/09/15   [name], MD  metoprolol succinate (TOPROL-XL) 50 MG 24 hr tablet Take 50 mg by mouth 2 (two) times daily.  12/19/12   [provider]  Multiple Vitamin (MULTIVITAMIN WITH MINERALS) TABS tablet Take 1 tablet by mouth daily.    [provider]    Family History No family history on file.  Social History Social History   Tobacco Use  . Smoking status: Never Smoker  . Smokeless tobacco: Never Used  Substance Use Topics  . Alcohol use: No    Alcohol/week: 0.0 oz  . Drug use: No     Allergies   Iodine   Review of Systems Review of Systems  Constitutional:       Per HPI, otherwise negative  HENT:       Per HPI, otherwise negative  Respiratory:       Per HPI, otherwise negative  Cardiovascular:       Per HPI, otherwise negative  Gastrointestinal: Negative for vomiting.  Endocrine:       Negative aside from HPI  Genitourinary:       Neg aside from HPI   Musculoskeletal:       Per HPI, otherwise negative  Skin: Negative.   Neurological: Positive for weakness. Negative for syncope.     Physical Exam Updated Vital Signs BP 129/73   Pulse  85   Temp 98.1 F (36.7 C) (Oral)   Resp 18   SpO2 96%   Physical Exam  Constitutional: She is oriented to person, place, and time. She appears well-developed and well-nourished. No distress.  Deconditioned yet obese appearing elderly female awake and alert  HENT:  Head: Normocephalic and atraumatic.  Eyes: Conjunctivae and EOM are normal.  Cardiovascular: Normal rate and regular rhythm.  Pulmonary/Chest: Effort normal and breath sounds normal. No stridor. No respiratory distress.  Abdominal: She exhibits no distension.  Musculoskeletal: She exhibits no edema.  Right shoulder: Normal.       Left shoulder: She exhibits tenderness and bony tenderness. She exhibits no swelling, no effusion, no crepitus and no deformity.       Arms:      Legs: Neurological: She is alert and oriented to person, place, and time. She displays atrophy. No cranial nerve deficit.  Skin: Skin is warm and dry.  Psychiatric: She has a normal mood and affect.  Nursing note and vitals reviewed.    ED Treatments / Results  Labs (all labs ordered are listed, but only abnormal results are displayed) Labs Reviewed - No data to display  EKG  EKG Interpretation None       Radiology Dg Shoulder Left  Result Date: 05/08/2017 CLINICAL DATA:  Fall, left shoulder pain EXAM: LEFT SHOULDER - 2+ VIEW COMPARISON:  None. FINDINGS: Advanced degenerative changes in the left glenohumeral and AC joints with joint space loss and spurring. No acute bony abnormality. Specifically, no fracture, subluxation, or dislocation. Soft tissues are intact. IMPRESSION: Advanced degenerative changes.  No acute bony abnormality. Electronically Signed   By: Rolm Baptise M.D.   On: 05/08/2017 15:53   Dg Knee Complete 4 Views Left  Result Date: 05/08/2017 CLINICAL DATA:  Fall, bilateral knee pain. EXAM: LEFT KNEE - COMPLETE 4+ VIEW COMPARISON:  None. FINDINGS: Patient is status post scratched at prior left knee replacement. No joint  effusion. No acute bony abnormality. Specifically, no fracture, subluxation, or dislocation. Soft tissues are intact. IMPRESSION: Prior left knee replacement.  No acute bony abnormality. Electronically Signed   By: Rolm Baptise M.D.   On: 05/08/2017 15:52   Dg Knee Complete 4 Views Right  Result Date: 05/08/2017 CLINICAL DATA:  Status post fall with left knee pain. EXAM: RIGHT KNEE - COMPLETE 4+ VIEW COMPARISON:  12/20/2008 FINDINGS: No evidence of fracture, dislocation, or joint effusion. Post total right hip arthroplasty with stable appearance of the orthopedic hardware and alignment. Mild anterior soft tissue swelling. IMPRESSION: No acute fracture or dislocation identified about the right knee, post total right knee arthroplasty. Electronically Signed   By: Fidela Salisbury M.D.   On: 05/08/2017 15:54    Procedures Procedures (including critical care time)  Medications Ordered in ED Medications - No data to display   Initial Impression / Assessment and Plan / ED Course  I have reviewed the triage vital signs and the nursing notes.  Pertinent labs & imaging results that were available during my care of the patient were reviewed by me and considered in my medical decision making (see chart for details).  4:14 PM On repeat exam patient is awake and alert, no distress. We had a lengthy conversation about these findings.  With reassuring x-ray, there is no evidence for fracture, little suspicion for neurologic dysfunction, no evidence for acute other pathology. I discussed her home situation, we agreed that the patient would benefit from referral to home health services, including nursing, social work, PT. Patient started on tramadol, as she states she has allergy to Tylenol and ibuprofen. Patient discharged in stable condition.  Final Clinical Impressions(s) / ED Diagnoses   Final diagnoses:  Fall, initial encounter     Carmin Muskrat, MD 05/08/17 [phone]

## 2017-05-08 NOTE — ED Notes (Signed)
Pt placed on a hospital bed for comfort, dinner tray provided.

## 2017-05-08 NOTE — ED Triage Notes (Addendum)
Patient from home via [place] [facility] after fall this morning. Per PTAR, the patient called 911 after a fall at 0400 this morning after which she was not able to get up. Responders helped the patient to a recliner and she refused transport to hospital. The patient called 911 this afternoon requesting help to the restroom stating she was unable to walk to the bathroom herself. Patient found to still be sitting in same recliner as at 0400.  Patient states she has chronic generalized pain and the pain now is "slightly elevated", stating her baseline is 5/10 and right now she is 6/10.   PTAR states on their arrival the patient had asked for help to the bathroom but was unable to ambulate even with assistance. Also, she had already soiled her brief.  Patient lives alone, she states she has no family or friends that check on her regularly. She does have a home health aide that visits briefly Monday, Wednesday, and Friday.

## 2017-05-08 NOTE — ED Provider Notes (Signed)
Patient seen and evaluated earlier.  Patient is awaiting social work consult in the morning.  Prescription for tramadol was written by previous physician.  Prescription was not signed.  I was asked to read print and sign tramadol prescription.  However, patient is on Cymbalta and due to QT prolongation with these 2 medicines, tramadol is contraindicated.  I am discontinuing tramadol.  Patient will need to continue Tylenol for pain.   Pattricia Boss, MD 05/08/17 2122

## 2017-05-08 NOTE — ED Notes (Signed)
Face to face consult with case management placed. Awaiting completion before discharge can be accomplished.

## 2017-05-09 ENCOUNTER — Encounter (HOSPITAL_COMMUNITY): Payer: Self-pay | Admitting: *Deleted

## 2017-05-09 ENCOUNTER — Other Ambulatory Visit: Payer: Self-pay

## 2017-05-09 ENCOUNTER — Emergency Department (HOSPITAL_COMMUNITY)
Admission: EM | Admit: 2017-05-09 | Discharge: 2017-05-09 | Disposition: A | Discharge status: Home / Self Care | Payer: Medicare Other | Source: Home / Self Care

## 2017-05-09 DIAGNOSIS — E1122 Type 2 diabetes mellitus with diabetic chronic kidney disease: Secondary | ICD-10-CM | POA: Insufficient documentation

## 2017-05-09 DIAGNOSIS — M25512 Pain in left shoulder: Secondary | ICD-10-CM | POA: Insufficient documentation

## 2017-05-09 DIAGNOSIS — M79604 Pain in right leg: Secondary | ICD-10-CM

## 2017-05-09 DIAGNOSIS — Y92002 Bathroom of unspecified non-institutional (private) residence single-family (private) house as the place of occurrence of the external cause: Secondary | ICD-10-CM | POA: Insufficient documentation

## 2017-05-09 DIAGNOSIS — Z79899 Other long term (current) drug therapy: Secondary | ICD-10-CM | POA: Insufficient documentation

## 2017-05-09 DIAGNOSIS — M25562 Pain in left knee: Secondary | ICD-10-CM | POA: Insufficient documentation

## 2017-05-09 DIAGNOSIS — Z7901 Long term (current) use of anticoagulants: Secondary | ICD-10-CM | POA: Insufficient documentation

## 2017-05-09 DIAGNOSIS — M25561 Pain in right knee: Secondary | ICD-10-CM | POA: Insufficient documentation

## 2017-05-09 DIAGNOSIS — M79605 Pain in left leg: Principal | ICD-10-CM

## 2017-05-09 DIAGNOSIS — Z96653 Presence of artificial knee joint, bilateral: Secondary | ICD-10-CM | POA: Insufficient documentation

## 2017-05-09 DIAGNOSIS — Z853 Personal history of malignant neoplasm of breast: Secondary | ICD-10-CM | POA: Insufficient documentation

## 2017-05-09 DIAGNOSIS — I129 Hypertensive chronic kidney disease with stage 1 through stage 4 chronic kidney disease, or unspecified chronic kidney disease: Secondary | ICD-10-CM | POA: Insufficient documentation

## 2017-05-09 DIAGNOSIS — Z8542 Personal history of malignant neoplasm of other parts of uterus: Secondary | ICD-10-CM | POA: Insufficient documentation

## 2017-05-09 DIAGNOSIS — Y999 Unspecified external cause status: Secondary | ICD-10-CM | POA: Insufficient documentation

## 2017-05-09 DIAGNOSIS — N181 Chronic kidney disease, stage 1: Secondary | ICD-10-CM | POA: Insufficient documentation

## 2017-05-09 DIAGNOSIS — Y939 Activity, unspecified: Secondary | ICD-10-CM | POA: Insufficient documentation

## 2017-05-09 DIAGNOSIS — W19XXXA Unspecified fall, initial encounter: Secondary | ICD-10-CM | POA: Insufficient documentation

## 2017-05-09 MED ORDER — HYDROCODONE-ACETAMINOPHEN 5-325 MG PO TABS
1.0000 | ORAL_TABLET | Freq: Once | ORAL | Status: AC
  Administered 2017-05-09: 1 via ORAL
  Filled 2017-05-09: qty 1

## 2017-05-09 MED ORDER — HYDROCODONE-ACETAMINOPHEN 5-325 MG PO TABS: 1.0000 | ORAL_TABLET | Freq: Four times a day (QID) | ORAL | 0 refills | Status: DC | PRN

## 2017-05-09 NOTE — ED Notes (Signed)
Patient was given a ice pack to place on her upper thigh.

## 2017-05-09 NOTE — ED Triage Notes (Signed)
PTAR in PT room for transport home. Pt has DC papers.

## 2017-05-09 NOTE — ED Notes (Signed)
PT to be transported to [facility] via [place]. RT knee pain. DC  papers include a Rx for Norco 5-325 mg.

## 2017-05-09 NOTE — Progress Notes (Signed)
CSW spoke with pt at bedside about SNF options. Pt reported that pt did NOT want SNF placement and wanted to go back home where pt already has aides that come in from an agency called [facility].Pt reports that these aides come three times a week for 8 hours each day. Pt requested that PT/OT be started back up so that pt can get therapy at home. CSW spoke with MD and confirmed that MD has already placed face to face for PT/Ot, RN, and Social Work at this time. RN to call PTAR for transport when pt is ready. At this time there are no further CSW needs. CSW signing off.     Virgie Dad Ailsa Mireles, MSW, [facility] Clinical Social Worker [phone]

## 2017-05-09 NOTE — Discharge Planning (Signed)
Clinical Social Work is seeking post-discharge placement for this patient at the following level of care: SNF.    

## 2017-05-09 NOTE — NC FL2 (Signed)
[place] MEDICAID FL2 LEVEL OF CARE SCREENING TOOL     IDENTIFICATION  Patient Name: NHYIRA LEANO Birthdate: 02/22/1941 Sex: female Admission Date (Current Location): 05/09/2017  [facility] and [place] Number:  Herbalist and Address:  [facility]. [facility] [place] [address] [place], [place] [address]      Provider Number: [id number]  Attending Physician Name and Address:  No att. providers found  Relative Name and Phone Number:       Current Level of Care: Hospital Recommended Level of Care: [place] Prior Approval Number:    Date Approved/Denied:   PASRR Number:   [id number]   Discharge Plan: SNF    Current Diagnoses: Patient Active Problem List   Diagnosis Date Noted  . Pulmonary embolus ([place])   . Pulmonary embolus, left ([place])   . Pulmonary embolus, right ([place])   . CKD (chronic kidney disease), stage I   . Bilateral pulmonary embolism ([place])   . PE (pulmonary embolism)   . Acute kidney injury superimposed on chronic kidney disease ([place]) 11/30/2015  . Depression 11/30/2015  . Essential hypertension 11/30/2015  . Acute respiratory failure with hypoxia ([place]) 11/30/2015  . Diabetes mellitus type 2 in obese ([place]) 11/30/2015  . Pulmonary embolism ([place]) 11/29/2015  . Pulmonary embolism, bilateral ([place]) 11/29/2015  . Pain in lower limb 01/10/2015  . Malignant neoplasm of upper-outer quadrant of left breast in female, estrogen receptor positive ([place]) 01/24/2013  . Edema 01/09/2013  . Onychomycosis 01/09/2013  . Pain in joint, ankle and foot 01/09/2013    Orientation RESPIRATION BLADDER Height & Weight     Self, Time, Situation, Place  Normal Incontinent Weight:   Height:     BEHAVIORAL SYMPTOMS/MOOD NEUROLOGICAL BOWEL NUTRITION STATUS      Incontinent Diet(please see discharge summary. )  AMBULATORY STATUS COMMUNICATION OF NEEDS Skin   Limited Assist Verbally Normal                       Personal Care  Assistance Level of Assistance  Bathing, Feeding, Dressing Bathing Assistance: Maximum assistance Feeding assistance: Limited assistance Dressing Assistance: Maximum assistance     Functional Limitations Info  Sight, Hearing, Speech Sight Info: Adequate Hearing Info: Adequate Speech Info: Adequate    SPECIAL CARE FACTORS FREQUENCY  PT (By licensed PT), OT (By licensed OT)     PT Frequency: 5 times a week  OT Frequency: 5 times a week             Contractures Contractures Info: Not present    Additional Factors Info  Code Status, Allergies Code Status Info: Prior  Allergies Info: Iodine           Current Medications (05/09/2017):  This is the current hospital active medication list No current facility-administered medications for this encounter.    Current Outpatient Medications  Medication Sig Dispense Refill  . apixaban (ELIQUIS) 5 MG TABS tablet Take 2 tablets (10 mg total) by mouth 2 (two) times daily. Last dose will be on July 19 4 tablet 0  . buPROPion (WELLBUTRIN SR) 150 MG 12 hr tablet Take 150 mg by mouth 2 (two) times daily.    . carisoprodol (SOMA) 350 MG tablet Take 350 mg by mouth every 6 (six) hours.     . Diclofenac Sodium (PENNSAID) 1.5 % SOLN Place 10 drops onto the skin 4 (four) times daily. For pain    . DULoxetine (CYMBALTA) 20 MG capsule Take 1  capsule (20 mg total) by mouth daily. 30 capsule 3  . exemestane (AROMASIN) 25 MG tablet Take 1 tablet (25 mg total) by mouth daily. 90 tablet 3  . fenofibrate 160 MG tablet Take 160 mg by mouth daily.    Marland Kitchen FIBER COMPLETE TABS Take 1 tablet by mouth daily.    . furosemide (LASIX) 20 MG tablet     . gabapentin (NEURONTIN) 300 MG capsule Take 1 capsule (300 mg total) by mouth at bedtime. 90 capsule 4  . HYDROcodone-acetaminophen (NORCO/VICODIN) 5-325 MG tablet Take 1 tablet by mouth every 6 (six) hours as needed for moderate pain. 8 tablet 0  . Menthol-Methyl Salicylate (ICY HOT) 77-93 % STCK Apply 1  application topically daily as needed (for pain).    . metFORMIN (GLUCOPHAGE) 500 MG tablet Take 1 tablet (500 mg total) by mouth 2 (two) times daily with a meal. 60 tablet 0  . metoprolol succinate (TOPROL-XL) 50 MG 24 hr tablet Take 50 mg by mouth 2 (two) times daily.     . Multiple Vitamin (MULTIVITAMIN WITH MINERALS) TABS tablet Take 1 tablet by mouth daily.       Discharge Medications: Please see discharge summary for a list of discharge medications.  Relevant Imaging Results:  Relevant Lab Results:   Additional Information SSN- [id number]  Wetzel Bjornstad, LCSWA

## 2017-05-09 NOTE — ED Triage Notes (Signed)
PT transferred via PTAR  To [facility]

## 2017-05-09 NOTE — ED Notes (Signed)
PT DC with PTAR 

## 2017-05-09 NOTE — ED Notes (Signed)
Pure wick was placed on patient, a Regular Diet ordered for Lunch.

## 2017-05-09 NOTE — ED Provider Notes (Signed)
Patient seen and evaluated again today.  After yesterday's reassuring eval, the patient felt unsteady with ambulation.  Although she and I discussed home health services, and arraignments were made for SW, RN, PT, she elected to go to a rehab facility and was re-registered.  She remains in similar condition to yesterday with no e/o no pathology.  She is now being discharged to rehab.   Carmin Muskrat, MD 05/09/17 [phone]

## 2017-05-09 NOTE — Progress Notes (Signed)
CSW was informed that [facility] is able to take pt today. CSW received call from [place]. She met with pt at bedside and confirmed that we can send pt at 2pm today. RN working on Scientist, clinical (histocompatibility and immunogenetics) over to evaluate pt's med at this time. CSW will speak with MD to for AVS at this time. Pt to be discharged to [facility] when medically stable. RN to call report to [phone]. There are no further CSW needs. CSW signing off at this time.        Virgie Dad Nolawi Kanady, MSW, [facility] Clinical Social Worker [phone]

## 2017-05-09 NOTE — ED Triage Notes (Signed)
PT accepted at  [facility] . SW  Assisting in placement

## 2017-05-09 NOTE — ED Triage Notes (Signed)
PT refused CBG check this AM

## 2017-05-09 NOTE — Discharge Instructions (Signed)
As discussed, your evaluation today has been largely reassuring.  But, it is important that you monitor your condition carefully, and do not hesitate to return to the ED if you develop new, or concerning changes in your condition. ? ?Otherwise, please follow-up with your physician for appropriate ongoing care. ? ?

## 2017-05-09 NOTE — ED Notes (Signed)
Patient stated she don't have sugar and refused for me to take CBG, Nurse Luellen Pucker was informed.

## 2017-05-09 NOTE — ED Notes (Signed)
Pure wick removed off patient, and patient had void 450 cc.

## 2017-05-09 NOTE — ED Triage Notes (Addendum)
On arrival to home via [facility] ,Pt requested to go to rehab facility . PTAR  Called ED to report the Pt was returning to Ed for placement in Rehab facility.Pt returned to room F-5. Pt A/O  On arrival  To room.  PTAR  Drivers reported Pt took all her AM medications while at her house.

## 2017-05-09 NOTE — Progress Notes (Signed)
CSW has faxed pt out. CSW reached out to Valeria at [facility] for assistance with placing pt as pt reports that pt has been here before. CSW continues to assists with discharge needs.     Virgie Dad Syble Picco, MSW, [facility] Clinical Social Worker [phone]

## 2017-05-09 NOTE — Discharge Planning (Signed)
EDCM spoke with PTAR via telephone regarding pt living conditions.  PTAR not comfortable with leaving pt at home alone.  [facility] consulted EDSW to anticipate pt return and need for SNF placement.  [place] services cancelled.

## 2017-08-11 ENCOUNTER — Telehealth: Payer: Self-pay

## 2017-08-11 ENCOUNTER — Ambulatory Visit: Payer: Medicare Other | Admitting: Oncology

## 2017-08-11 ENCOUNTER — Other Ambulatory Visit: Payer: Medicare Other

## 2017-08-11 NOTE — Telephone Encounter (Signed)
Left a detailed message of appointment change per 3/21 phone message returns. Patient requested day.

## 2017-08-13 NOTE — Progress Notes (Signed)
[facility]  Telephone:([phone] Fax:([phone]) [phone]   I have reviewed the above documentation for accuracy and completeness, and I agree with the above.   ID: [name] OB: 1940-07-29  MR#: [id number]  RWE#:[id number]  PCP: Sherry Blade, MD GYN:  [name] SU: [name] OTHER MD:  [name], [name] supple  CHIEF COMPLAINT: Early stage breast cancer  TREATMENT: Exemestane  BREAST CANCER HISTORY: From Dr [name]'s initial consult note 01/29/2013:  "[name] is a 77 y.o. female who is seen for an initial consultation visit. The patient has had multiple medical issues recently. By report the patient proceeded with a hysterectomy/BSO for endometrial cancer recently at [facility]. The patient did have some difficulty recovering from this and was in a skilled nursing facility for a short period of time. She then thought that she felt a possible mass within the left breast and she sought further evaluation for this. The patient proceeded with a mammogram and ultrasound which corresponded to suspicious findings.a core biopsy was obtained and this returned positive for invasive ductal carcinoma. Receptor studies have indicated that the tumor is ER positive, PR positive, and HER-2/neu negative. She then proceeded to undergo an MRI scan of the breasts bilaterally on 12/26/2012. This revealed a 1.9 cm irregular enhancing mass within the left breast at the 12:00 position. This corresponded to the recently diagnosed invasive mammary carcinoma.   The patient was seen by Dr. Donne Leon and she proceeded to perform a lumpectomy and sentinel lymph node evaluation on 01/11/2013. The patient's pathology returned positive for an invasive ductal carcinoma measuring 1.9 cm. The margins were negative with the closest margin being 1 cm. The sentinel lymph nodes were negative. This therefore represented a T1 C. N0 M0 tumor. "  Her subsequent history is  as detailed below  INTERVAL HISTORY: [name] returns today for follow-up and treatment of her estrogen receptor positive breast cancer. She is accompanied by her daughter. She continues on exemestane, which she tolerates well. She denies hot flashes. She is obtaining this medication at no cost at this time due to her new insurance.  She was evaluated in the ED on 05/08/2017 and 05/09/2017 for a fall and had bilateral knee xrays with results showing: Prior left knee replacement. No acute bony abnormality. No fracture or dislocation. On left shoulder xray results showed: Advanced degenerative changes. No acute bony abnormality.   Mammography was due February 2019, which she didn't obtain and would like for this to be scheduled. She normally has her mammograms completed at [facility].  REVIEW OF SYSTEMS: [name] reports that for exercise, she has a therapist come out as well as she walks "back and forth" sometimes. Regarding her fall in December 2018, she reports that she was going to the washroom at 3 AM and her legs gave out and she fell landing on her bilateral knees, right worse than left. She was assisted by the Fire Department in standing via her "Life Alert". She had to stay 5 days in a rehab facility, which she notes that she did "relatively alright". She denies any other falls. She notes that her bones are now starting to "pop" and "crack" in the morning and it is worsened on the left side. She denies unusual headaches, visual changes, nausea, vomiting, or dizziness. There has been no unusual cough, phlegm production, or pleurisy. This been no change in bowel or bladder habits. She denies unexplained fatigue or unexplained weight loss, bleeding, rash, or  fever. A detailed review of systems was otherwise stable.     PAST MEDICAL HISTORY: Past Medical History:  Diagnosis Date  . Allergy   . Arthritis    "joints; mostly on the right side" (01/11/2013)  . Breast cancer ([place])    "left"  (01/11/2013)  . Cancer [facility])    endometrial  . Chronic lower back pain   . Cold    just started 01/02/13 cough  . Complication of anesthesia    very phobic about needles.last surgery had to sedate before doing iv  . Depression   . Exertional shortness of breath   . Hyperlipidemia   . Hypertension   . Osteoporosis    knees   . Personal history of radiation therapy 03/2013   left  . Status post radiation therapy within last four weeks 03/19/13-05/03/13   lt breast 60.4GY  . Uterine cancer ([place])     PAST SURGICAL HISTORY: Past Surgical History:  Procedure Laterality Date  . ABDOMINAL HYSTERECTOMY  12/14/2012  . BREAST BIOPSY Left 12/2012  . BREAST LUMPECTOMY WITH NEEDLE LOCALIZATION AND AXILLARY SENTINEL LYMPH NODE BX Left 01/11/2013  . BREAST LUMPECTOMY WITH NEEDLE LOCALIZATION AND AXILLARY SENTINEL LYMPH NODE BX Left 01/11/2013   Procedure: BREAST LUMPECTOMY WITH NEEDLE LOCALIZATION AND AXILLARY SENTINEL LYMPH NODE BX;  Surgeon: Sherry Bookbinder, MD;  Location: [facility];  Service: General;  Laterality: Left;  . CHOLECYSTECTOMY  1970  . DILATION AND CURETTAGE OF UTERUS     "@ least one" (01/11/2013)  . FOOT TENDON SURGERY Right   . KNEE ARTHROSCOPY Right 1990's   "twice" (01/11/2013)  . REPLACEMENT TOTAL KNEE Bilateral 2011-2012  . TUBAL LIGATION      FAMILY HISTORY No family history on file.  the patient does not know the cause of death of either parent. She was a foster child and really knows very little about her family. She has some half sisters that she is aware of, but does not know them very well.   GYNECOLOGIC HISTORY:   menarche age 23, first live birth age 27, the patient is [name]. She went through the change of life around the age of 67. She took hormone replacement through the women's health study. 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 She is divorced, and lives by herself, with no  pets. Daughter [name] unfortunately died, sudden death, cause unclear. Son [name] lives in [place] and has a Designer, jewellery in psychology. The patient has no grandchildren. She is not a Ambulance person.    ADVANCED DIRECTIVES: Not in place but the patient intends to name her son as her healthcare power of attorney. He can be reached at [phone]. At the 10/10/2013 visit the patient was given the appropriate documents to complete and notarize at her discretion   HEALTH MAINTENANCE: Social History   Tobacco Use  . Smoking status: Never Smoker  . Smokeless tobacco: Never Used  Substance Use Topics  . Alcohol use: No    Alcohol/week: 0.0 oz  . Drug use: No     Colonoscopy:  PAP: Status post hysterectomy and bilateral salpingo-oophorectomy   Bone density: April 2014, showed minimal osteopenia, with a T score of -1.1  Lipid panel:  Allergies  Allergen Reactions  . Iodine Rash    Current Outpatient Medications  Medication Sig Dispense Refill  . apixaban (ELIQUIS) 5 MG TABS tablet Take 2 tablets (10 mg total) by mouth 2 (  two) times daily. Last dose will be on July 19 4 tablet 0  . buPROPion (WELLBUTRIN SR) 150 MG 12 hr tablet Take 150 mg by mouth 2 (two) times daily.    . carisoprodol (SOMA) 350 MG tablet Take 350 mg by mouth every 6 (six) hours.     . DULoxetine (CYMBALTA) 20 MG capsule Take 1 capsule (20 mg total) by mouth daily. 30 capsule 3  . exemestane (AROMASIN) 25 MG tablet Take 1 tablet (25 mg total) by mouth daily. 90 tablet 3  . fenofibrate 160 MG tablet Take 160 mg by mouth daily.    [name] Kitchen FIBER COMPLETE TABS Take 1 tablet by mouth daily.    . furosemide (LASIX) 20 MG tablet Take 20 mg by mouth every morning.     . gabapentin (NEURONTIN) 300 MG capsule Take 1 capsule (300 mg total) by mouth at bedtime. 90 capsule 4  . HYDROcodone-acetaminophen (NORCO/VICODIN) 5-325 MG tablet Take 1 tablet by mouth every 6 (six) hours as needed for moderate pain. 10 tablet 0  . metFORMIN  (GLUCOPHAGE) 500 MG tablet Take 1 tablet (500 mg total) by mouth 2 (two) times daily with a meal. 60 tablet 0  . metoprolol succinate (TOPROL-XL) 50 MG 24 hr tablet Take 50 mg by mouth 2 (two) times daily.     . Multiple Vitamin (MULTIVITAMIN WITH MINERALS) TABS tablet Take 1 tablet by mouth daily.    [name] Kitchen spironolactone (ALDACTONE) 50 MG tablet Take 50 mg by mouth every morning.     No current facility-administered medications for this visit.     OBJECTIVE:morbidly obese African-American woman examined in a wheelchair  Vitals:   08/15/17 0930  BP: 110/60  Pulse: 82  Resp: 18  Temp: 98.2 F (36.8 C)  SpO2: 97%     Body mass index is 42.5 kg/m.    ECOG FS:2 - Symptomatic, <50% confined to bed  Sclerae unicteric, pupils round and equal Oropharynx clear, slightly dry No cervical or supraclavicular adenopathy Lungs no rales or rhonchi Heart regular rate and rhythm Abd soft, obese, nontender, positive bowel sounds Neuro: nonfocal, well oriented, appropriate affect Breasts: The right breast is unremarkable.  The left breast is status post lumpectomy and radiation.  It is slightly smaller than the right.  There is still some hyperpigmentation and some skin thickening from the radiation.  There are no suspicious masses.  Both axillae are benign.  LAB RESULTS:  CMP     Component Value Date/Time   NA 140 01/10/2017 1046   K 4.6 01/10/2017 1046   CL 105 12/08/2015 0533   CO2 25 01/10/2017 1046   GLUCOSE 116 01/10/2017 1046   BUN 26.0 01/10/2017 1046   CREATININE 1.5 (H) 01/10/2017 1046   CALCIUM 10.8 (H) 01/10/2017 1046   PROT 7.4 01/10/2017 1046   ALBUMIN 3.7 01/10/2017 1046   AST 15 01/10/2017 1046   ALT 14 01/10/2017 1046   ALKPHOS 61 01/10/2017 1046   BILITOT 0.50 01/10/2017 1046   GFRNONAA 50 (L) 12/08/2015 0533   GFRAA 58 (L) 12/08/2015 0533    I No results found for: SPEP  Lab Results  Component Value Date   WBC 6.2 08/15/2017   NEUTROABS 4.6 08/15/2017   HGB 12.5  08/15/2017   HCT 39.8 08/15/2017   MCV 86.0 08/15/2017   PLT 237 08/15/2017      Chemistry      Component Value Date/Time   NA 140 01/10/2017 1046   K 4.6 01/10/2017 1046  CL 105 12/08/2015 0533   CO2 25 01/10/2017 1046   BUN 26.0 01/10/2017 1046   CREATININE 1.5 (H) 01/10/2017 1046      Component Value Date/Time   CALCIUM 10.8 (H) 01/10/2017 1046   ALKPHOS 61 01/10/2017 1046   AST 15 01/10/2017 1046   ALT 14 01/10/2017 1046   BILITOT 0.50 01/10/2017 1046       No results found for: LABCA2  No components found for: LPFXT024  No results for input(s): INR in the last 168 hours.  Urinalysis    Component Value Date/Time   COLORURINE AMBER BIOCHEMICALS MAY BE AFFECTED BY COLOR (A) 03/31/2010 1116   APPEARANCEUR CLOUDY (A) 03/31/2010 1116   LABSPEC 1.031 (H) 03/31/2010 1116   PHURINE 5.5 03/31/2010 1116   GLUCOSEU NEGATIVE 03/31/2010 1116   HGBUR NEGATIVE 03/31/2010 1116   [place] 03/31/2010 1116   KETONESUR NEGATIVE 03/31/2010 1116   PROTEINUR NEGATIVE 03/31/2010 1116   UROBILINOGEN 0.2 03/31/2010 1116   NITRITE NEGATIVE 03/31/2010 1116   LEUKOCYTESUR  03/31/2010 1116    NEGATIVE MICROSCOPIC NOT DONE ON URINES WITH NEGATIVE PROTEIN, BLOOD, LEUKOCYTES, NITRITE, OR GLUCOSE <1000 mg/dL.    STUDIES: She is behind on mammography  ASSESSMENT: 77 y.o. [place] woman status post left breast upper outer quadrant lumpectomy and sentinel lymph node sampling 01/11/2013 for a pT1c pN0, stage IA invasive ductal carcinoma, grade 1, estrogen receptor 100% positive, progesterone receptor 79% positive, with an MIB-1 of 17% and no HER-2 amplification.  (1) Oncotype DX score of 10 predicts an outside the breast risk of recurrence within 10 years of 7% if the patient's only systemic treatment is tamoxifen for 5 years. It also predicts no benefit from chemotherapy.  (2) completed adjuvant radiation December 2014  (3) on anastrozole January through March of 2015,  discontinued because of side effects  (4) exemestane started June 2015  (a) bone density 07/22/2012 was normal  (5) status post robotic hysterectomy with bilateral salpingo-oophorectomy 12/14/2012 for a noninvasive grade 1 endometrial cancer   (6) saddle embolus diagnosed 11/29/2015, s/p thrombolysis followed by apixaban as of 12/01/2015.  PLAN: Ziggy is now 4-1/2 years out from definitive surgery for her breast cancer with no evidence of disease recurrence.  This is very favorable.  She continues on antiestrogens, specifically exemestane, with excellent tolerance.  Also currently she is obtaining the drug at no cost which is very favorable.  She is behind on mammography.  I have entered the order for her to have that done within the next week.  Accordingly when she returns to see me next year I will see her in April, so she will have had her mammogram in 2020 before that visit.  That likely will be her "graduation" visit.  She knows to call for any other issues that may develop before her next visit.  Keyaira Clapham, Virgie Dad, MD  08/15/17 9:45 AM Medical Oncology and Hematology [facility] [address] [place], [place] [address] Tel. [phone]    Fax. [phone]    This document serves as a record of services personally performed by Lurline Del, MD. It was created on his behalf by Steva Colder, a trained medical scribe. The creation of this record is based on the scribe's personal observations and the provider's statements to them.   I have reviewed the above documentation for accuracy and completeness, and I agree with the above.

## 2017-08-15 ENCOUNTER — Inpatient Hospital Stay: Payer: Medicare Other | Attending: Oncology

## 2017-08-15 ENCOUNTER — Inpatient Hospital Stay (HOSPITAL_BASED_OUTPATIENT_CLINIC_OR_DEPARTMENT_OTHER): Payer: Medicare Other | Admitting: Oncology

## 2017-08-15 VITALS — BP 110/60 | HR 82 | Temp 98.2°F | Resp 18 | Ht 65.0 in

## 2017-08-15 DIAGNOSIS — C50412 Malignant neoplasm of upper-outer quadrant of left female breast: Secondary | ICD-10-CM | POA: Diagnosis present

## 2017-08-15 DIAGNOSIS — I2692 Saddle embolus of pulmonary artery without acute cor pulmonale: Secondary | ICD-10-CM | POA: Insufficient documentation

## 2017-08-15 DIAGNOSIS — Z79818 Long term (current) use of other agents affecting estrogen receptors and estrogen levels: Secondary | ICD-10-CM

## 2017-08-15 DIAGNOSIS — Z170 Estrogen receptor positive status [ER+]: Secondary | ICD-10-CM

## 2017-08-15 LAB — COMPREHENSIVE METABOLIC PANEL
ALT: 9 U/L (ref 0–55)
AST: 11 U/L (ref 5–34)
Albumin: 3.6 g/dL (ref 3.5–5.0)
Alkaline Phosphatase: 64 U/L (ref 40–150)
Anion gap: 8 (ref 3–11)
BILIRUBIN TOTAL: 0.4 mg/dL (ref 0.2–1.2)
BUN: 28 mg/dL — AB (ref 7–26)
CO2: 24 mmol/L (ref 22–29)
CREATININE: 1.36 mg/dL — AB (ref 0.60–1.10)
Calcium: 9.9 mg/dL (ref 8.4–10.4)
Chloride: 104 mmol/L (ref 98–109)
GFR, EST AFRICAN AMERICAN: 43 mL/min — AB (ref >60)
GFR, EST NON AFRICAN AMERICAN: 37 mL/min — AB (ref >60)
Glucose, Bld: 120 mg/dL (ref 70–140)
Potassium: 4.6 mmol/L (ref 3.5–5.1)
Sodium: 136 mmol/L (ref 136–145)
TOTAL PROTEIN: 7 g/dL (ref 6.4–8.3)

## 2017-08-15 LAB — CBC WITH DIFFERENTIAL/PLATELET
BASOS ABS: 0 10*3/uL (ref 0.0–0.1)
BASOS PCT: 0 %
EOS ABS: 0.1 10*3/uL (ref 0.0–0.5)
EOS PCT: 1 %
HCT: 39.8 % (ref 34.8–46.6)
Hemoglobin: 12.5 g/dL (ref 11.6–15.9)
LYMPHS ABS: 1 10*3/uL (ref 0.9–3.3)
Lymphocytes Relative: 15 %
MCH: 27 pg (ref 25.1–34.0)
MCHC: 31.4 g/dL — ABNORMAL LOW (ref 31.5–36.0)
MCV: 86 fL (ref 79.5–101.0)
Monocytes Absolute: 0.6 10*3/uL (ref 0.1–0.9)
Monocytes Relative: 10 %
Neutro Abs: 4.6 10*3/uL (ref 1.5–6.5)
Neutrophils Relative %: 74 %
PLATELETS: 237 10*3/uL (ref 145–400)
RBC: 4.63 MIL/uL (ref 3.70–5.45)
RDW: 14.6 % — ABNORMAL HIGH (ref 11.2–14.5)
WBC: 6.2 10*3/uL (ref 3.9–10.3)

## 2017-09-27 ENCOUNTER — Ambulatory Visit
Admission: RE | Admit: 2017-09-27 | Discharge: 2017-09-27 | Disposition: A | Discharge status: Home / Self Care | Payer: Medicare Other | Source: Ambulatory Visit | Attending: Oncology | Admitting: Oncology

## 2017-09-27 DIAGNOSIS — C50412 Malignant neoplasm of upper-outer quadrant of left female breast: Secondary | ICD-10-CM

## 2017-09-27 DIAGNOSIS — Z170 Estrogen receptor positive status [ER+]: Principal | ICD-10-CM

## 2017-10-07 LAB — COLOGUARD

## 2017-11-09 ENCOUNTER — Encounter: Payer: Self-pay | Admitting: Gastroenterology

## 2018-01-17 ENCOUNTER — Ambulatory Visit: Payer: Medicare Other | Admitting: Gastroenterology

## 2018-02-14 ENCOUNTER — Other Ambulatory Visit: Payer: Self-pay | Admitting: Oncology

## 2018-03-13 ENCOUNTER — Ambulatory Visit: Payer: Medicare Other | Admitting: Gastroenterology

## 2018-03-16 ENCOUNTER — Other Ambulatory Visit: Payer: Self-pay | Admitting: *Deleted

## 2018-03-16 MED ORDER — EXEMESTANE 25 MG PO TABS: 25.0000 mg | ORAL_TABLET | Freq: Every day | ORAL | 3 refills | Status: DC

## 2018-04-11 ENCOUNTER — Encounter: Payer: Self-pay | Admitting: Gastroenterology

## 2018-04-11 ENCOUNTER — Ambulatory Visit (INDEPENDENT_AMBULATORY_CARE_PROVIDER_SITE_OTHER): Payer: Medicare Other | Admitting: Gastroenterology

## 2018-04-11 ENCOUNTER — Telehealth: Payer: Self-pay | Admitting: *Deleted

## 2018-04-11 VITALS — BP 110/60 | HR 96 | Ht 65.0 in | Wt 276.4 lb

## 2018-04-11 DIAGNOSIS — R195 Other fecal abnormalities: Secondary | ICD-10-CM

## 2018-04-11 MED ORDER — NA SULFATE-K SULFATE-MG SULF 17.5-3.13-1.6 GM/177ML PO SOLN: 1.0000 | Freq: Once | ORAL | 0 refills | Status: AC

## 2018-04-11 NOTE — Progress Notes (Addendum)
[name]    [id number]    22-Jan-1941  Primary Care Physician:Shelton, Joelene Millin, MD  Referring Physician: Willey Blade, [place], [place] [address]  Chief complaint: Positive Cologaurd, intermittent BRBPR HPI:  77 year old female status post hysterectomy and bilateral salpingo-oophorectomy for endometrial cancer, breast cancer status post lumpectomy to discuss positive Cologuard.  She had colonoscopy about 15 years ago in [place].   She has intermittent bright red blood per rectum when she wipes after bowel movement. Denies any recent change in bowel habits, loss of appetite, weight loss, abdominal pain, nausea, vomiting, heartburn, dysphagia or odynophagia.  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   Outpatient Encounter Medications as of 04/11/2018  Medication Sig  . apixaban (ELIQUIS) 5 MG TABS tablet Take 2 tablets (10 mg total) by mouth 2 (two) times daily. Last dose will be on July 19  . buPROPion (WELLBUTRIN SR) 150 MG 12 hr tablet Take 150 mg by mouth 2 (two) times daily.  . carisoprodol (SOMA) 350 MG tablet Take 350 mg by mouth every 6 (six) hours.   . DULoxetine (CYMBALTA) 20 MG capsule Take 1 capsule (20 mg total) by mouth daily.  Marland Kitchen exemestane (AROMASIN) 25 MG tablet Take 1 tablet (25 mg total) by mouth daily.  . fenofibrate 160 MG tablet Take 160 mg by mouth daily.  Marland Kitchen FIBER COMPLETE TABS Take 1 tablet by mouth daily.  . furosemide (LASIX) 20 MG tablet Take 20 mg by mouth every morning.   . gabapentin (NEURONTIN) 300 MG capsule Take 1 capsule (300 mg total) by mouth at bedtime.  . gabapentin (NEURONTIN) 300 MG capsule TAKE 1 CAPSULE BY MOUTH AT BEDTIME  . HYDROcodone-acetaminophen (NORCO/VICODIN) 5-325 MG tablet Take 1 tablet by mouth every 6 (six) hours as needed for moderate pain.  . metFORMIN  (GLUCOPHAGE) 500 MG tablet Take 1 tablet (500 mg total) by mouth 2 (two) times daily with a meal.  . metoprolol succinate (TOPROL-XL) 50 MG 24 hr tablet Take 50 mg by mouth 2 (two) times daily.   . Multiple Vitamin (MULTIVITAMIN WITH MINERALS) TABS tablet Take 1 tablet by mouth daily.  Marland Kitchen spironolactone (ALDACTONE) 50 MG tablet Take 50 mg by mouth every morning.   No facility-administered encounter medications on file as of 04/11/2018.     Allergies as of 04/11/2018 - Review Complete 08/15/2017  Allergen Reaction Noted  . Iodine Rash 01/01/2013    Past Medical History:  Diagnosis Date  . Allergy   . Arthritis    "joints; mostly on the right side" (01/11/2013)  . Breast cancer ([place])    "left" (01/11/2013)  . Cancer [facility])    endometrial  . Chronic lower back pain   . Cold    just started 01/02/13 cough  . Complication of anesthesia    very phobic about needles.last surgery had to sedate before doing iv  . Depression   . Exertional shortness of breath   . Hyperlipidemia   . Hypertension   . Osteoporosis    knees   . Personal history of radiation therapy 03/2013   left  . Status post radiation therapy within last four weeks 03/19/13-05/03/13   lt breast 60.4GY  . Uterine cancer [facility])     Past Surgical History:  Procedure Laterality Date  . ABDOMINAL HYSTERECTOMY  12/14/2012  . BREAST BIOPSY Left 12/2012  .  BREAST LUMPECTOMY Left 2014  . BREAST LUMPECTOMY WITH NEEDLE LOCALIZATION AND AXILLARY SENTINEL LYMPH NODE BX Left 01/11/2013  . BREAST LUMPECTOMY WITH NEEDLE LOCALIZATION AND AXILLARY SENTINEL LYMPH NODE BX Left 01/11/2013   Procedure: BREAST LUMPECTOMY WITH NEEDLE LOCALIZATION AND AXILLARY SENTINEL LYMPH NODE BX;  Surgeon: Rolm Bookbinder, MD;  Location: [facility];  Service: General;  Laterality: Left;  . CHOLECYSTECTOMY  1970  . DILATION AND CURETTAGE OF UTERUS     "@ least one" (01/11/2013)  . FOOT TENDON SURGERY Right   . KNEE ARTHROSCOPY Right 1990's   "twice"  (01/11/2013)  . REPLACEMENT TOTAL KNEE Bilateral 2011-2012  . TUBAL LIGATION      Family History  Problem Relation Age of Onset  . Breast cancer Mother 44    Social History   Socioeconomic History  . Marital status: Divorced    Spouse name: Not on file  . Number of children: 1  . Years of education: Not on file  . Highest education level: Not on file  Occupational History  . Occupation: retired  Scientific laboratory technician  . Financial resource strain: Not on file  . Food insecurity:    Worry: Not on file    Inability: Not on file  . Transportation needs:    Medical: Not on file    Non-medical: Not on file  Tobacco Use  . Smoking status: Never Smoker  . Smokeless tobacco: Never Used  Substance and Sexual Activity  . Alcohol use: No    Alcohol/week: 0.0 standard drinks  . Drug use: No  . Sexual activity: Never  Lifestyle  . Physical activity:    Days per week: Not on file    Minutes per session: Not on file  . Stress: Not on file  Relationships  . Social connections:    Talks on phone: Not on file    Gets together: Not on file    Attends religious service: Not on file    Active member of club or organization: Not on file    Attends meetings of clubs or organizations: Not on file    Relationship status: Not on file  . Intimate partner violence:    Fear of current or ex partner: Not on file    Emotionally abused: Not on file    Physically abused: Not on file    Forced sexual activity: Not on file  Other Topics Concern  . Not on file  Social History Narrative   Lives alone in a one story home.  Has one living child.  Retired from school system.  Education: masters degree      Review of systems: Review of Systems  Constitutional: Negative for fever and chills.  HENT: Negative.   Eyes: Negative for blurred vision.  Respiratory: Negative for cough, shortness of breath and wheezing.   Cardiovascular: Negative for chest pain and palpitations.  Gastrointestinal: as per  HPI Genitourinary: Negative for dysuria, urgency, frequency and hematuria.  Musculoskeletal: Positive for myalgias, back pain and joint pain.  Skin: Negative for itching and rash.  Neurological: Negative for dizziness, tremors, focal weakness, seizures and loss of consciousness.  Endo/Heme/Allergies: Negative for seasonal allergies.  Psychiatric/Behavioral: Negative for suicidal ideas and hallucinations.  Positive for anxiety and depression All other systems reviewed and are negative.   Physical Exam: Vitals:   04/11/18 1321  BP: 110/60  Pulse: 96   Body mass index is 45.99 kg/m. Gen:      No acute distress, obese, in wheelchair but can transfer to  exam table HEENT:  EOMI, sclera anicteric Neck:     No masses; no thyromegaly Lungs:    Clear to auscultation bilaterally; normal respiratory effort CV:         Regular rate and rhythm; no murmurs Abd:      + bowel sounds; soft, non-tender; no palpable masses, no distension Ext:    Mild edema; adequate peripheral perfusion Skin:      Warm and dry; no rash Neuro: alert and oriented x 3 Psych: normal mood and affect  Data Reviewed:  Reviewed labs, radiology imaging, old records and pertinent past GI work up   Assessment and Plan/Recommendations:  77 year old female with morbid obesity with history of hypertension, diabetes, endometrial cancer status post TAH and BSO, breast cancer status post lumpectomy, saddle pulmonary embolus status post thrombolysis on chronic anticoagulation with Eliquis Positive cologaurd.  Last colonoscopy over 15 years ago We will schedule for colonoscopy The risks and benefits as well as alternatives of endoscopic procedure(s) have been discussed and reviewed. All questions answered. The patient agrees to proceed. We will discuss with her provider if okay to hold Eliquis for 36 to 48 hours prior to the procedure. Return as needed after colonoscopy  K. Denzil Magnuson , MD [phone]    CC: Willey Blade, MD

## 2018-04-11 NOTE — Telephone Encounter (Signed)
Faxed today     04/11/2018   RE: [name] DOB: 11/11/1940 MRN: [id number]   Dear Adele Barthel,    We have scheduled the above patient for an endoscopic procedure. Our records show that she is on anticoagulation therapy.   Please advise as to how long the patient may come off her therapy of Eliquis prior to the procedure, which is scheduled for Eliquis.  Please fax back/ or route the completed form to [facility] at [phone].   Sincerely,    Tonita Phoenix AAMA

## 2018-04-11 NOTE — Patient Instructions (Signed)
You have been scheduled for a colonoscopy. Please follow written instructions given to you at your visit today.  Please pick up your prep supplies at the pharmacy within the next 1-3 days. If you use inhalers (even only as needed), please bring them with you on the day of your procedure.   If you are age 75 or older, your body mass index should be between 23-30. Your Body mass index is 45.99 kg/m. If this is out of the aforementioned range listed, please consider follow up with your Primary Care Provider.  If you are age 27 or younger, your body mass index should be between 19-25. Your Body mass index is 45.99 kg/m. If this is out of the aformentioned range listed, please consider follow up with your Primary Care Provider.    Thank you for choosing [facility]  Karleen Hampshire Nand[place]am,MD

## 2018-04-13 NOTE — Telephone Encounter (Signed)
Received fax today about patients Eliquis to hold 3 days prior to procedure  Per Heath Gold , MD   Will send fax to be scanned in     Spoke with patient and she is aware

## 2018-05-09 ENCOUNTER — Ambulatory Visit (AMBULATORY_SURGERY_CENTER): Payer: Medicare Other | Admitting: Gastroenterology

## 2018-05-09 ENCOUNTER — Encounter: Payer: Self-pay | Admitting: Gastroenterology

## 2018-05-09 VITALS — BP 133/80 | HR 98 | Temp 97.8°F | Resp 12 | Ht 65.0 in | Wt 276.0 lb

## 2018-05-09 DIAGNOSIS — R195 Other fecal abnormalities: Secondary | ICD-10-CM

## 2018-05-09 DIAGNOSIS — K649 Unspecified hemorrhoids: Secondary | ICD-10-CM | POA: Diagnosis not present

## 2018-05-09 DIAGNOSIS — K5730 Diverticulosis of large intestine without perforation or abscess without bleeding: Secondary | ICD-10-CM

## 2018-05-09 DIAGNOSIS — D123 Benign neoplasm of transverse colon: Secondary | ICD-10-CM

## 2018-05-09 MED ORDER — SODIUM CHLORIDE 0.9 % IV SOLN: 500.0000 mL | Freq: Once | INTRAVENOUS | Status: DC

## 2018-05-09 NOTE — Op Note (Addendum)
[facility] Patient Name: [name] Procedure Date: 05/09/2018 2:55 PM MRN: [id number] Endoscopist: Mauri Pole , MD Age: 77 Referring MD:  Date of Birth: Feb 18, 1941 Gender: Female Account #: [id number] Procedure:                Colonoscopy Indications:              Positive Cologuard test Medicines:                Monitored Anesthesia Care Procedure:                Pre-Anesthesia Assessment:                           - Prior to the procedure, a History and Physical                            was performed, and patient medications and                            allergies were reviewed. The patient's tolerance of                            previous anesthesia was also reviewed. The risks                            and benefits of the procedure and the sedation                            options and risks were discussed with the patient.                            All questions were answered, and informed consent                            was obtained. Prior Anticoagulants: The patient                            last took Eliquis (apixaban) 2 days prior to the                            procedure. ASA Grade Assessment: III - A patient                            with severe systemic disease. After reviewing the                            risks and benefits, the patient was deemed in                            satisfactory condition to undergo the procedure.                           After obtaining informed consent, the colonoscope  was passed under direct vision. Throughout the                            procedure, the patient's blood pressure, pulse, and                            oxygen saturations were monitored continuously. The                            Colonoscope was introduced through the anus and                            advanced to the the cecum, identified by                            appendiceal orifice and ileocecal  valve. The                            colonoscopy was performed without difficulty. The                            patient tolerated the procedure well. The quality                            of the bowel preparation was adequate. The                            ileocecal valve, appendiceal orifice, and rectum                            were photographed. Scope In: 3:06:19 PM Scope Out: 3:29:01 PM Scope Withdrawal Time: 0 hours 10 minutes 32 seconds  Total Procedure Duration: 0 hours 22 minutes 42 seconds  Findings:                 The perianal and digital rectal examinations were                            normal.                           A 7 mm polyp was found in the transverse colon. The                            polyp was sessile. The polyp was removed with a                            cold snare. Resection and retrieval were complete.                           Multiple small and large-mouthed diverticula were                            found in the sigmoid colon, descending colon,  transverse colon and ascending colon.                           Non-bleeding internal hemorrhoids were found during                            retroflexion. The hemorrhoids were large. Complications:            No immediate complications. Estimated Blood Loss:     Estimated blood loss was minimal. Impression:               - One 7 mm polyp in the transverse colon, removed                            with a cold snare. Resected and retrieved.                           - Moderate diverticulosis in the sigmoid colon, in                            the descending colon, in the transverse colon and                            in the ascending colon.                           - Non-bleeding internal hemorrhoids. Recommendation:           - Patient has a contact number available for                            emergencies. The signs and symptoms of potential                             delayed complications were discussed with the                            patient. Return to normal activities tomorrow.                            Written discharge instructions were provided to the                            patient.                           - Resume previous diet.                           - Continue present medications.                           - Await pathology results.                           - Repeat colonoscopy date to be determined after  pending pathology results are reviewed for                            surveillance based on pathology results.                           - Resume Eliquis (apixaban) at prior dose tomorrow. Mauri Pole, MD 05/09/2018 3:32:40 PM This report has been signed electronically.

## 2018-05-09 NOTE — Patient Instructions (Signed)
    Information on polyps,divericulosis ,& hemorrhoids given to you today   Await pathology results on polyp removed   RESUME ELIQUIS AT PRIOR DOSE TOMORROW   YOU HAD AN ENDOSCOPIC PROCEDURE TODAY AT [facility]:   Refer to the procedure report that was given to you for any specific questions about what was found during the examination.  If the procedure report does not answer your questions, please call your gastroenterologist to clarify.  If you requested that your care partner not be given the details of your procedure findings, then the procedure report has been included in a sealed envelope for you to review at your convenience later.  YOU SHOULD EXPECT: Some feelings of bloating in the abdomen. Passage of more gas than usual.  Walking can help get rid of the air that was put into your GI tract during the procedure and reduce the bloating. If you had a lower endoscopy (such as a colonoscopy or flexible sigmoidoscopy) you may notice spotting of blood in your stool or on the toilet paper. If you underwent a bowel prep for your procedure, you may not have a normal bowel movement for a few days.  Please Note:  You might notice some irritation and congestion in your nose or some drainage.  This is from the oxygen used during your procedure.  There is no need for concern and it should clear up in a day or so.  SYMPTOMS TO REPORT IMMEDIATELY:   Following lower endoscopy (colonoscopy or flexible sigmoidoscopy):  Excessive amounts of blood in the stool  Significant tenderness or worsening of abdominal pains  Swelling of the abdomen that is new, acute  Fever of 100F or higher    For urgent or emergent issues, a gastroenterologist can be reached at any hour by calling [phone].   DIET:  We do recommend a small meal at first, but then you may proceed to your regular diet.  Drink plenty of fluids but you should avoid alcoholic beverages for 24 hours.  ACTIVITY:  You  should plan to take it easy for the rest of today and you should NOT DRIVE or use heavy machinery until tomorrow (because of the sedation medicines used during the test).    FOLLOW UP: Our staff will call the number listed on your records the next business day following your procedure to check on you and address any questions or concerns that you may have regarding the information given to you following your procedure. If we do not reach you, we will leave a message.  However, if you are feeling well and you are not experiencing any problems, there is no need to return our call.  We will assume that you have returned to your regular daily activities without incident.  If any biopsies were taken you will be contacted by phone or by letter within the next 1-3 weeks.  Please call us at [phone] if you have not heard about the biopsies in 3 weeks.    SIGNATURES/CONFIDENTIALITY: You and/or your care partner have signed paperwork which will be entered into your electronic medical record.  These signatures attest to the fact that that the information above on your After Visit Summary has been reviewed and is understood.  Full responsibility of the confidentiality of this discharge information lies with you and/or your care-partner.

## 2018-05-09 NOTE — Progress Notes (Signed)
Report to PACU, RN, vss, BBS= Clear.  

## 2018-05-09 NOTE — Progress Notes (Signed)
Called to room to assist during endoscopic procedure.  Patient ID and intended procedure confirmed with present staff. Received instructions for my participation in the procedure from the performing physician.  

## 2018-05-10 ENCOUNTER — Telehealth: Payer: Self-pay

## 2018-05-10 NOTE — Telephone Encounter (Signed)
  Follow up Call-  Call back number 05/09/2018  Post procedure Call Back phone  # [phone]  Permission to leave phone message Yes  Some recent data might be hidden     No answer

## 2018-05-10 NOTE — Telephone Encounter (Signed)
  Follow up Call-  Call back number 05/09/2018  Post procedure Call Back phone  # [phone]  Permission to leave phone message Yes  Some recent data might be hidden     Patient questions:  Do you have a fever, pain , or abdominal swelling? No. Pain Score  0 *  Have you tolerated food without any problems? Yes.    Have you been able to return to your normal activities? Yes.    Do you have any questions about your discharge instructions: Diet   No. Medications  No. Follow up visit  No.  Do you have questions or concerns about your Care? No.  Actions: * If pain score is 4 or above: No action needed, pain <4.

## 2018-05-15 ENCOUNTER — Encounter: Payer: Self-pay | Admitting: Gastroenterology

## 2018-09-01 ENCOUNTER — Telehealth: Payer: Self-pay | Admitting: Oncology

## 2018-09-01 NOTE — Telephone Encounter (Signed)
Rescheduled 4/14 to 8/19 per sch msg. Called and spoke with patient. Confirmed date and time

## 2018-09-05 ENCOUNTER — Other Ambulatory Visit: Payer: Medicare Other

## 2018-09-05 ENCOUNTER — Ambulatory Visit: Payer: Medicare Other | Admitting: Oncology

## 2018-12-01 ENCOUNTER — Emergency Department (HOSPITAL_COMMUNITY): Payer: Medicare Other

## 2018-12-01 ENCOUNTER — Other Ambulatory Visit: Payer: Self-pay

## 2018-12-01 ENCOUNTER — Encounter (HOSPITAL_COMMUNITY): Payer: Self-pay

## 2018-12-01 ENCOUNTER — Emergency Department (HOSPITAL_COMMUNITY)
Admission: EM | Admit: 2018-12-01 | Discharge: 2018-12-05 | Disposition: A | Discharge status: Skilled Nursing Facility | Payer: Medicare Other | Attending: Emergency Medicine | Admitting: Emergency Medicine

## 2018-12-01 DIAGNOSIS — Z96653 Presence of artificial knee joint, bilateral: Secondary | ICD-10-CM | POA: Diagnosis not present

## 2018-12-01 DIAGNOSIS — M25551 Pain in right hip: Secondary | ICD-10-CM | POA: Diagnosis not present

## 2018-12-01 DIAGNOSIS — R531 Weakness: Secondary | ICD-10-CM

## 2018-12-01 DIAGNOSIS — Z7984 Long term (current) use of oral hypoglycemic drugs: Secondary | ICD-10-CM | POA: Diagnosis not present

## 2018-12-01 DIAGNOSIS — I129 Hypertensive chronic kidney disease with stage 1 through stage 4 chronic kidney disease, or unspecified chronic kidney disease: Secondary | ICD-10-CM | POA: Diagnosis not present

## 2018-12-01 DIAGNOSIS — E1122 Type 2 diabetes mellitus with diabetic chronic kidney disease: Secondary | ICD-10-CM | POA: Diagnosis not present

## 2018-12-01 DIAGNOSIS — Z79899 Other long term (current) drug therapy: Secondary | ICD-10-CM | POA: Insufficient documentation

## 2018-12-01 DIAGNOSIS — M79604 Pain in right leg: Secondary | ICD-10-CM | POA: Diagnosis present

## 2018-12-01 DIAGNOSIS — N181 Chronic kidney disease, stage 1: Secondary | ICD-10-CM | POA: Insufficient documentation

## 2018-12-01 DIAGNOSIS — Z9049 Acquired absence of other specified parts of digestive tract: Secondary | ICD-10-CM | POA: Insufficient documentation

## 2018-12-01 DIAGNOSIS — Z03818 Encounter for observation for suspected exposure to other biological agents ruled out: Secondary | ICD-10-CM | POA: Diagnosis not present

## 2018-12-01 DIAGNOSIS — R262 Difficulty in walking, not elsewhere classified: Secondary | ICD-10-CM | POA: Insufficient documentation

## 2018-12-01 DIAGNOSIS — M21951 Unspecified acquired deformity of right thigh: Secondary | ICD-10-CM

## 2018-12-01 DIAGNOSIS — Z7901 Long term (current) use of anticoagulants: Secondary | ICD-10-CM | POA: Diagnosis not present

## 2018-12-01 DIAGNOSIS — Z853 Personal history of malignant neoplasm of breast: Secondary | ICD-10-CM | POA: Insufficient documentation

## 2018-12-01 NOTE — Progress Notes (Signed)
CSW received a call from [employer], pt's son is calling and insisting on placement for the pt. Per the [employer], pt came in a for a fall a week ago and now after a week, pt is back.  Per [employer], pt did not get checked out after the fall and there is no fracture.   Per [employer], pt has no admittable diagnosis and pt's son is at ph: [phone]  And is calling the [employer] in a highly emotional state and becoming upset at hearing pt is up for D/C.  Per notes, "Pt BIB EMS from home. Pt c/o worsening mobility x 1 week from fall. Pt c/o R leg and hip pain. Pt normally ambulatory with walker stated she could not walk today".  Pt has a HX of knee replacement in her right knee.  Per RN's note:   Patient reports she is unable to stand and is afraid to go home alone. Patient reports she has no family and no support system. States she has a health aide come by every day, but when the aide leaves, the patient is left to herself. Patient states over the past few days, she has gone from being able to care for herself after the aide leaves to not being able to get out of bed to use the bathroom or eat. Case Management consulted- states they will try to get the patient PT. Dr. Rex Kras notified. Attempted to stand and walk patient with walker, as she usually does at home, and patient is not even strong enough to get out of bed assisted. MD. Rex Kras made aware  CSW will continue to follow for D/C needs.  Alphonse Guild. Iverna Hammac, LCSW, LCAS, CSI Transitions of Care Clinical Social Worker Care Coordination Department Ph: [phone]

## 2018-12-01 NOTE — ED Notes (Signed)
Tried to ambulate pt with a walker but she could not sit up in the bed.  She was wheezing just trying to sit up in the bed.

## 2018-12-01 NOTE — Progress Notes (Addendum)
CSW received a call from [name] who requests that CSW seek information on whether pt has or needs a bedside commode to facilitate a safer DC and also to seek pt's preference for a [facility] agency.  7:14 PM Pt chooses [facility] as she has used them before and pt stated she DOES already have a bedside commode in the home.  CSW confirmed both of these X2 with the pt.  TOC RN CM updated.  Please reconsult if future social work needs arise.  CSW signing off, as social work intervention is no longer needed.  [name]. Sherry Jergens, LCSW, LCAS, CSI Transitions of Care Clinical Social Worker Care Coordination Department Ph: [phone]

## 2018-12-01 NOTE — ED Notes (Signed)
Patient given sandwich and juice

## 2018-12-01 NOTE — ED Notes (Signed)
Patient returned from radiology

## 2018-12-01 NOTE — ED Provider Notes (Signed)
Pt signed out to me bu R. Little, MD. Please see previous notes for further history.   In brief, patient boarding in the ED overnight.  She had a fall 1 week ago onto her right side, no medical evaluation at that time.  She has had progressive worsening right hip pain making it hard for her to walk.  She does have a walker at home.  Work-up today was reassuring, no fracture.  Imaging consistent with bad osteoarthritis in the setting of morbid obesity.  Question deconditioning versus limitations due to pain.  There is concern about mobility.  Patient has a home aide that comes once a day, but no home health or physical therapy.  Social work to discuss with patient and son about options.  Plan for PT eval tomorrow morning.  If patient is truly limited, reassess with social work to see options for possible placement.     Franchot Heidelberg, PA-C 12/02/18 0272    Varney Biles, MD 12/03/18 [phone]

## 2018-12-01 NOTE — ED Triage Notes (Signed)
Pt BIB EMS from home. Pt c/o worsening mobility x 1 week from fall. Pt c/o R leg and hip pain. Pt normally ambulatory with walker stated she could not walk today.

## 2018-12-01 NOTE — ED Notes (Signed)
Patient reports she is unable to stand and is afraid to go home alone. Patient reports she has no family and no support system. States she has a health aide come by every day, but when the aide leaves, the patient is left to herself. Patient states over the past few days, she has gone from being able to care for herself after the aide leaves to not being able to get out of bed to use the bathroom or eat. Case Management consulted- states they will try to get the patient PT. Dr. Rex Kras notified. Attempted to stand and walk patient with walker, as she usually does at home, and patient is not even strong enough to get out of bed assisted. MD. Rex Kras made aware.

## 2018-12-01 NOTE — ED Notes (Signed)
Patient in radiology

## 2018-12-01 NOTE — ED Provider Notes (Addendum)
[facility] [facility] DEPT Provider Note   CSN: [id number] Arrival date & time: 12/01/18  1316    History   Chief Complaint Chief Complaint  Patient presents with  . Leg Pain    HPI [name] is a 78 y.o. female.     78 year old female with past medical history below including hypertension, hyperlipidemia, uterine cancer, PE on Eliquis who presents with right leg pain after a fall.  1 week ago, she was trying to get up from seated position and lost her balance, falling onto her right side.  She states that she initially had a red mark on her right side of her face but did not lose consciousness.  She reports pain along her right side mainly in her right hip, right leg, and right knee.  She has been able to ambulate with a walker but with difficulty.  She had more difficulty walking today.  He has history of knee replacement in right knee.  The history is provided by the patient.  Leg Pain   Past Medical History:  Diagnosis Date  . Allergy   . Arthritis    "joints; mostly on the right side" (01/11/2013)  . Breast cancer ([place])    "left" (01/11/2013)  . Cancer [facility])    endometrial  . Chronic lower back pain   . Cold    just started 01/02/13 cough  . Complication of anesthesia    very phobic about needles.last surgery had to sedate before doing iv  . Depression   . Exertional shortness of breath   . Hyperlipidemia   . Hypertension   . Osteoporosis    knees   . Personal history of radiation therapy 03/2013   left  . Status post radiation therapy within last four weeks 03/19/13-05/03/13   lt breast 60.4GY  . Uterine cancer [facility])     Patient Active Problem List   Diagnosis Date Noted  . Pulmonary embolus ([place])   . Pulmonary embolus, left ([place])   . Pulmonary embolus, right ([place])   . CKD (chronic kidney disease), stage I   . Bilateral pulmonary embolism ([place])   . PE (pulmonary embolism)   . Acute kidney injury superimposed on chronic kidney  disease ([place]) 11/30/2015  . Depression 11/30/2015  . Essential hypertension 11/30/2015  . Acute respiratory failure with hypoxia ([place]) 11/30/2015  . Diabetes mellitus type 2 in obese ([place]) 11/30/2015  . Pulmonary embolism ([place]) 11/29/2015  . Pulmonary embolism, bilateral ([place]) 11/29/2015  . Pain in lower limb 01/10/2015  . Malignant neoplasm of upper-outer quadrant of left breast in female, estrogen receptor positive ([place]) 01/24/2013  . Edema 01/09/2013  . Onychomycosis 01/09/2013  . Pain in joint, ankle and foot 01/09/2013    Past Surgical History:  Procedure Laterality Date  . ABDOMINAL HYSTERECTOMY  12/14/2012  . BREAST BIOPSY Left 12/2012  . BREAST LUMPECTOMY Left 2014  . BREAST LUMPECTOMY WITH NEEDLE LOCALIZATION AND AXILLARY SENTINEL LYMPH NODE BX Left 01/11/2013  . BREAST LUMPECTOMY WITH NEEDLE LOCALIZATION AND AXILLARY SENTINEL LYMPH NODE BX Left 01/11/2013   Procedure: BREAST LUMPECTOMY WITH NEEDLE LOCALIZATION AND AXILLARY SENTINEL LYMPH NODE BX;  Surgeon: Rolm Bookbinder, MD;  Location: [facility];  Service: General;  Laterality: Left;  . CHOLECYSTECTOMY  1970  . DILATION AND CURETTAGE OF UTERUS     "@ least one" (01/11/2013)  . FOOT TENDON SURGERY Right   . KNEE ARTHROSCOPY Right 1990's   "twice" (01/11/2013)  . REPLACEMENT TOTAL KNEE Bilateral 2011-2012  .  TUBAL LIGATION       OB History   No obstetric history on file.      Home Medications    Prior to Admission medications   Medication Sig Start Date End Date Taking? Authorizing Provider  acetaminophen (TYLENOL) 650 MG CR tablet Take 1,300 mg by mouth daily as needed for pain.   Yes [provider]  AMBULATORY NON FORMULARY MEDICATION Take 1 mL by mouth daily. Ritualx Hemp Extract   Yes [provider]  apixaban (ELIQUIS) 5 MG TABS tablet Take 2 tablets (10 mg total) by mouth 2 (two) times daily. Last dose will be on July 19 Patient taking differently: Take 10 mg by mouth 2 (two) times daily.   12/09/15  Yes [name], MD  B-COMPLEX-C PO Take 1 tablet by mouth daily.   Yes [provider]  buPROPion (WELLBUTRIN SR) 150 MG 12 hr tablet Take 150 mg by mouth 2 (two) times daily. 11/11/15  Yes [provider]  carisoprodol (SOMA) 350 MG tablet Take 350 mg by mouth every 6 (six) hours.  12/15/12  Yes [provider]  DULoxetine (CYMBALTA) 20 MG capsule Take 1 capsule (20 mg total) by mouth daily. 08/17/13  Yes Causey, Charlestine Massed, NP  exemestane (AROMASIN) 25 MG tablet Take 1 tablet (25 mg total) by mouth daily. 03/16/18  Yes Magrinat, Virgie Dad, MD  fenofibrate 160 MG tablet Take 160 mg by mouth daily. 10/02/15  Yes [provider]  gabapentin (NEURONTIN) 300 MG capsule TAKE 1 CAPSULE BY MOUTH AT BEDTIME Patient taking differently: Take 300 mg by mouth 3 (three) times daily.  02/15/18  Yes Magrinat, Virgie Dad, MD  Menthol, Topical Analgesic, (BIOFREEZE ROLL-ON) 4 % GEL Apply 1 application topically daily as needed (heel pain).   Yes [provider]  Menthol, Topical Analgesic, (ICY HOT) 7.5 % (Roll) MISC Apply 1 each topically daily as needed (pain).   Yes [provider]  Menthol-Zinc Oxide (CALMOSEPTINE) 0.44-20.6 % OINT Apply 1 application topically daily as needed (bottom sores).   Yes [provider]  metFORMIN (GLUCOPHAGE) 500 MG tablet Take 1 tablet (500 mg total) by mouth 2 (two) times daily with a meal. 12/09/15  Yes [name], MD  metoprolol succinate (TOPROL-XL) 50 MG 24 hr tablet Take 50 mg by mouth 2 (two) times daily.  12/19/12  Yes [provider]  Nutritional Supplements (QUINOA KALE & HEMP PO) Take 1 tablet by mouth daily.    Yes [provider]  Propylene Glycol (SYSTANE BALANCE OP) Apply 1 drop to eye daily as needed (dry eyes).   Yes [provider]  spironolactone (ALDACTONE) 50 MG tablet Take 50 mg by mouth every morning. 04/24/17  Yes [provider]    Family  History Family History  Problem Relation Age of Onset  . Breast cancer Mother 22  . Esophageal cancer Neg Hx   . Colon cancer Neg Hx   . Stomach cancer Neg Hx   . Rectal cancer Neg Hx     Social History Social History   Tobacco Use  . Smoking status: Never Smoker  . Smokeless tobacco: Never Used  Substance Use Topics  . Alcohol use: No    Alcohol/week: 0.0 standard drinks  . Drug use: No     Allergies   Iodine   Review of Systems Review of Systems All other systems reviewed and are negative except that which was mentioned in HPI   Physical Exam Updated Vital  Signs BP (!) 173/88 (BP Location: Right Arm)   Pulse 75   Temp 98.1 F (36.7 C) (Oral)   Resp 18   SpO2 100%   Physical Exam Vitals signs and nursing note reviewed.  Constitutional:      General: She is not in acute distress.    Appearance: She is well-developed.  HENT:     Head: Normocephalic and atraumatic.  Eyes:     Conjunctiva/sclera: Conjunctivae normal.     Pupils: Pupils are equal, round, and reactive to light.  Neck:     Musculoskeletal: Neck supple. No muscular tenderness.  Cardiovascular:     Rate and Rhythm: Normal rate and regular rhythm.     Pulses: Normal pulses.  Pulmonary:     Effort: Pulmonary effort is normal.  Chest:     Chest wall: No tenderness.  Abdominal:     General: There is no distension.     Palpations: Abdomen is soft.     Tenderness: There is no abdominal tenderness.  Musculoskeletal:        General: Tenderness present. No swelling or deformity.     Comments: Mild tenderness R anterior R hip, R lateral knee without swelling or bruising  Skin:    General: Skin is warm and dry.     Comments: Faint bruising R toes without tenderness  Neurological:     Mental Status: She is alert and oriented to person, place, and time.     Sensory: No sensory deficit.     Comments: Fluent speech  Psychiatric:        Judgment: Judgment normal.      ED Treatments / Results   Labs (all labs ordered are listed, but only abnormal results are displayed) Labs Reviewed - No data to display  EKG None  Radiology Dg Knee 1-2 Views Right  Result Date: 12/01/2018 CLINICAL DATA:  Worsening mobility for a week after fall, RIGHT leg and hip pain. EXAM: RIGHT KNEE - 1-2 VIEW COMPARISON:  Plain film of the RIGHT knee dated 05/08/2017. FINDINGS: RIGHT knee arthroplasty hardware appears intact and stable in alignment. No evidence of acute osseous fracture or dislocation. No appreciable joint effusion. Atherosclerosis of the popliteal artery. Soft tissues about the RIGHT knee are otherwise unremarkable. IMPRESSION: 1. No acute findings. 2. RIGHT knee arthroplasty hardware appears intact and stable in alignment. 3. Atherosclerosis. Electronically Signed   By: Franki Cabot M.D.   On: 12/01/2018 15:27   Ct Head Wo Contrast  Result Date: 12/01/2018 CLINICAL DATA:  [address] and hit the right side of her head 1 week ago. On blood thinners. Worsening mobility. EXAM: CT HEAD WITHOUT CONTRAST TECHNIQUE: Contiguous axial images were obtained from the base of the skull through the vertex without intravenous contrast. COMPARISON:  None. FINDINGS: Brain: Mildly enlarged ventricles and cortical sulci. Mild patchy white matter low density in both cerebral hemispheres. No intracranial hemorrhage, mass lesion or CT evidence of acute infarction. Vascular: No hyperdense vessel or unexpected calcification. Skull: Normal. Negative for fracture or focal lesion. Sinuses/Orbits: Status post bilateral cataract extraction. Unremarkable bones and included paranasal sinuses. Other: None. IMPRESSION: No acute intracranial abnormality. Electronically Signed   By: Claudie Revering M.D.   On: 12/01/2018 15:31   Ct Hip Right Wo Contrast  Result Date: 12/01/2018 CLINICAL DATA:  Status post fall, right hip pain EXAM: CT OF THE RIGHT HIP WITHOUT CONTRAST TECHNIQUE: Multidetector CT imaging of the right hip was performed  according to the standard protocol. Multiplanar CT image reconstructions  were also generated. COMPARISON:  None. FINDINGS: Bones/Joint/Cartilage No fracture or dislocation. Normal alignment. No joint effusion. Severe right hip joint space narrowing with a bone-on-bone appearance, subchondral cystic changes and marginal osteophytes consistent with severe advanced osteoarthritis. Mild osteoarthritis of the right SI joint. Severe facet arthropathy of L5-S1. Ligaments Ligaments are suboptimally evaluated by CT. Muscles and Tendons Muscles are normal.  Mild muscle atrophy. Soft tissue No fluid collection or hematoma.  No soft tissue mass. IMPRESSION: 1. No acute osseous injury of right hip. 2. Severe osteoarthritis of the right hip. Electronically Signed   By: Kathreen Devoid   On: 12/01/2018 16:37   Dg Hip Unilat With Pelvis 2-3 Views Right  Result Date: 12/01/2018 CLINICAL DATA:  Right hip pain and worsening mobility following a fall 1 week ago. EXAM: DG HIP (WITH OR WITHOUT PELVIS) 2-3V RIGHT COMPARISON:  None. FINDINGS: Marked right hip joint space narrowing with associated spur formation. No fracture or dislocation seen. Similar changes seen involving the left hip to a lesser degree. Also noted are lower lumbar spine degenerative changes. IMPRESSION: 1. No fracture or dislocation. 2. Bilateral hip degenerative changes, greater on the right. 3. Lower lumbar spine degenerative changes. Electronically Signed   By: Claudie Revering M.D.   On: 12/01/2018 15:33   Dg Femur 1v Right  Result Date: 12/01/2018 CLINICAL DATA:  Worsening mobility 1 week after fall, RIGHT leg and hip pain. EXAM: RIGHT FEMUR 1 VIEW COMPARISON:  CT abdomen and pelvis dated 02/08/2013. FINDINGS: Again noted is advanced degenerative osteoarthritis at the RIGHT hip joint, with near complete loss of joint space and abutment or near abutment of the RIGHT femoral head and acetabulum, with associated osteophyte formation. Heterogeneity at the subcapital  femoral neck region is likely related to these underlying degenerative changes. RIGHT femur shaft is intact and normally aligned. RIGHT knee arthroplasty hardware appears intact and appropriately position. Soft tissues about the RIGHT femur are unremarkable. IMPRESSION: 1. No acute findings.  No definite fracture.  No dislocation. 2. Advanced degenerative osteoarthritis at the RIGHT hip joint, as detailed above. Heterogeneity in the region of the subcapital femoral neck is likely related to these underlying degenerative changes, similar appearance on earlier CT abdomen of 02/08/2013, but would consider CT of the RIGHT hip to exclude nondisplaced fracture. 3. RIGHT knee arthroplasty hardware appears intact and appropriately position. Electronically Signed   By: Franki Cabot M.D.   On: 12/01/2018 15:32    Procedures Procedures (including critical care time)  Medications Ordered in ED Medications - No data to display   Initial Impression / Assessment and Plan / ED Course  I have reviewed the triage vital signs and the nursing notes.  Pertinent imaging results that were available during my care of the patient were reviewed by me and considered in my medical decision making (see chart for details).       PT neuro intact, no obvious deformity.  Because of anticoagulant use and the fact that she hit her head, obtain CT of head which was negative acute.  Plain films of extremities show no obvious fracture, questionable area on femoral neck.  Obtain CT to ensure no occult fracture.  CT shows no acute findings, she does have severe osteoarthritis.  I have referred her back to her primary orthopedic surgeon as she may be eligible for noninvasive modalities for pain management.  She states that she would like more assistance at home therefore contacted case management and placed face-to-face order for consideration of PT/OT. Pt  seen by Roderic Palau, Education officer, museum.   I received a call from patient's son, [name] ([phone]), regarding patient's discharge. He expressed concern over her weakness and fall risk at home as she lives alone. I acknowledged and agreed with concerns but explained that patient has no acute injury or process warranting admission to the hospital. Furthermore, admitting her for weakness would likely only result in observation admission and she would need a 3-day qualifying stay in order to be placed directly to SNF. Because of his ongoing concerns, I have contacted Roderic Palau again to discuss with patient and then with son. I have placed PT consult in the morning. Patient signed out pending SW discussion and PT eval.  Final Clinical Impressions(s) / ED Diagnoses   Final diagnoses:  Right hip pain    ED Discharge Orders    None       Derinda Bartus, Wenda Overland, MD 12/01/18 1806    Rex Kras, Wenda Overland, MD 12/01/18 2224

## 2018-12-02 MED ORDER — MENTHOL-ZINC OXIDE 0.44-20.6 % EX OINT: 1.0000 "application " | TOPICAL_OINTMENT | Freq: Every day | CUTANEOUS | Status: DC | PRN

## 2018-12-02 MED ORDER — METFORMIN HCL 500 MG PO TABS
500.0000 mg | ORAL_TABLET | Freq: Two times a day (BID) | ORAL | Status: DC
  Administered 2018-12-02 – 2018-12-05 (×6): 500 mg via ORAL
  Filled 2018-12-02 (×6): qty 1

## 2018-12-02 MED ORDER — APIXABAN 5 MG PO TABS
10.0000 mg | ORAL_TABLET | Freq: Two times a day (BID) | ORAL | Status: DC
  Administered 2018-12-02 (×2): 10 mg via ORAL
  Filled 2018-12-02 (×2): qty 2

## 2018-12-02 MED ORDER — BUPROPION HCL ER (SR) 150 MG PO TB12
150.0000 mg | ORAL_TABLET | Freq: Two times a day (BID) | ORAL | Status: DC
  Administered 2018-12-02 – 2018-12-05 (×8): 150 mg via ORAL
  Filled 2018-12-02 (×8): qty 1

## 2018-12-02 MED ORDER — FENOFIBRATE 160 MG PO TABS
160.0000 mg | ORAL_TABLET | Freq: Every day | ORAL | Status: DC
  Administered 2018-12-02 – 2018-12-05 (×4): 160 mg via ORAL
  Filled 2018-12-02 (×4): qty 1

## 2018-12-02 MED ORDER — EXEMESTANE 25 MG PO TABS
25.0000 mg | ORAL_TABLET | Freq: Every day | ORAL | Status: DC
  Administered 2018-12-02 – 2018-12-05 (×4): 25 mg via ORAL
  Filled 2018-12-02 (×4): qty 1

## 2018-12-02 MED ORDER — METOPROLOL SUCCINATE ER 50 MG PO TB24
50.0000 mg | ORAL_TABLET | Freq: Two times a day (BID) | ORAL | Status: DC
  Administered 2018-12-02 – 2018-12-05 (×8): 50 mg via ORAL
  Filled 2018-12-02 (×8): qty 1

## 2018-12-02 MED ORDER — DULOXETINE HCL 20 MG PO CPEP
20.0000 mg | ORAL_CAPSULE | Freq: Every day | ORAL | Status: DC
  Administered 2018-12-02 – 2018-12-05 (×4): 20 mg via ORAL
  Filled 2018-12-02 (×4): qty 1

## 2018-12-02 MED ORDER — ZINC OXIDE 40 % EX OINT
TOPICAL_OINTMENT | Freq: Every day | CUTANEOUS | Status: DC | PRN
  Filled 2018-12-02: qty 57

## 2018-12-02 MED ORDER — SPIRONOLACTONE 25 MG PO TABS
50.0000 mg | ORAL_TABLET | Freq: Every day | ORAL | Status: DC
  Administered 2018-12-02 – 2018-12-05 (×4): 50 mg via ORAL
  Filled 2018-12-02 (×4): qty 2

## 2018-12-02 MED ORDER — ACETAMINOPHEN 325 MG PO TABS
650.0000 mg | ORAL_TABLET | Freq: Once | ORAL | Status: AC
  Administered 2018-12-02: 650 mg via ORAL
  Filled 2018-12-02: qty 2

## 2018-12-02 MED ORDER — APIXABAN 5 MG PO TABS
5.0000 mg | ORAL_TABLET | Freq: Two times a day (BID) | ORAL | Status: DC
  Administered 2018-12-02 – 2018-12-05 (×6): 5 mg via ORAL
  Filled 2018-12-02 (×6): qty 1

## 2018-12-02 MED ORDER — CARISOPRODOL 350 MG PO TABS
350.0000 mg | ORAL_TABLET | Freq: Four times a day (QID) | ORAL | Status: DC
  Administered 2018-12-02 – 2018-12-05 (×13): 350 mg via ORAL
  Filled 2018-12-02 (×13): qty 1

## 2018-12-02 MED ORDER — GABAPENTIN 300 MG PO CAPS
300.0000 mg | ORAL_CAPSULE | Freq: Three times a day (TID) | ORAL | Status: DC
  Administered 2018-12-02 – 2018-12-05 (×9): 300 mg via ORAL
  Filled 2018-12-02 (×9): qty 1

## 2018-12-02 NOTE — Evaluation (Signed)
Physical Therapy Evaluation Patient Details Name: [name] MRN: [id number] DOB: 1940/11/20 Today's Date: 12/02/2018   History of Present Illness  78 yo female admitted to ED on 7/10 with worsening mobility secondary to fall on 7/3. Pt complaining of RLE and R hip pain, CT head and hip negative for acute findings. PMH includes HTN, HLD, uterine cancer, breast cancer, PE, OA, depression, DOE, OP, CKD, DM, bilateral TKR.  Clinical Impression   Pt presents with significant total body weakness, R hip and RLE pain, difficulty performing all mobility tasks, inability to come to standing with total assist +2, and decreased activity tolerance. Pt to benefit from acute PT to address deficits. Pt required between max and total assist for bed mobility, and attempted standing x2 but was unsuccessful even with total assist +2. Pt also very anxious with mobility, per pt she has had multiple falls recently at home. Pt lives home alone with 2 hours of assist 4 times a week, which is not sufficient given pt's mobility status. PT recommending SNF to address mobility deficits. PT to progress mobility as tolerated, and will continue to follow acutely.      Follow Up Recommendations SNF;Supervision/Assistance - 24 hour    Equipment Recommendations  None recommended by PT    Recommendations for Other Services       Precautions / Restrictions Precautions Precautions: Fall Restrictions Weight Bearing Restrictions: No      Mobility  Bed Mobility Overal bed mobility: Needs Assistance Bed Mobility: Supine to Sit;Sit to Supine;Rolling Rolling: Max assist   Supine to sit: Max assist;HOB elevated Sit to supine: HOB elevated;Total assist   General bed mobility comments: Max assist for rolling for bilateral rolling with use of bed rails and bed pad, pt soiled in urine and required rolling to remove soiled pads. Max-total assist for supine<>sit for LE management, trunk elevation and lowering, scooting pt  up in bed with use of boost function and bed pads, and scooting to EOB with use of bed pads. Pt sat EOB 5 minutes without PT support.  Transfers Overall transfer level: Needs assistance Equipment used: Rolling walker (2 wheeled) Transfers: Sit to/from Stand Sit to Stand: Total assist;+2 physical assistance;+2 safety/equipment;From elevated surface         General transfer comment: Sit to stand attempt x2. Pt required total assist +2 to attempt power up, hip extension to come to standing. Pt unable to come to full standing on both attempts, returned to supine.  Ambulation/Gait Ambulation/Gait assistance: (NT)              Stairs            Wheelchair Mobility    Modified Rankin (Stroke Patients Only)       Balance Overall balance assessment: Needs assistance;History of Falls Sitting-balance support: Bilateral upper extremity supported;Feet supported Sitting balance-Leahy Scale: Fair     Standing balance support: Bilateral upper extremity supported Standing balance-Leahy Scale: Zero Standing balance comment: required total assist to attempt to stand, unable to attempt without total assist                             Pertinent Vitals/Pain Pain Assessment: Faces Faces Pain Scale: Hurts even more Pain Location: R hip, RLE Pain Descriptors / Indicators: Sore Pain Intervention(s): Limited activity within patient's tolerance;Monitored during session;Repositioned    Home Living Family/patient expects to be discharged to:: Private residence Living Arrangements: Alone Available Help at Discharge: Home health(Pt has  a home health aide come in 2 hours a day, 4 days a week.) Type of Home: House Home Access: Ramped entrance     Home Layout: One level Home Equipment: Clinical cytogeneticist - 2 wheels;Grab bars - toilet;Grab bars - tub/shower      Prior Function Level of Independence: Needs assistance   Gait / Transfers Assistance Needed: Pt reports using RW  for ambulation PTA, but states over the past few weeks she has gotten progressively weaker and has been in the bed mostly.  ADL's / Homemaking Assistance Needed: Pt reports receiving assist for ADLs from home health aide, including bathing, dressing, and cooking meals.  Comments: Pt does not have family or friends to assist her at home, per pt report. Pt states she has had multiple falls in the past year, has been to SNF x5 in the past.     Hand Dominance   Dominant Hand: Right    Extremity/Trunk Assessment   Upper Extremity Assessment Upper Extremity Assessment: Generalized weakness(weakness present L>R)    Lower Extremity Assessment Lower Extremity Assessment: Generalized weakness;RLE deficits/detail;LLE deficits/detail RLE Deficits / Details: MMT: hip flexion 2/5, knee extension 3/5, knee flexion 3/5, PF/DF WNL RLE Sensation: WNL LLE Deficits / Details: MMT: hip flexion 2-/5, knee extension 3/5, knee flexion 3/5, PF/DF WNL. Pt requires increased time and effort to perform tasks on L side, pt reports to history of radiation LLE Sensation: WNL    Cervical / Trunk Assessment Cervical / Trunk Assessment: Normal  Communication   Communication: No difficulties  Cognition Arousal/Alertness: Awake/alert Behavior During Therapy: WFL for tasks assessed/performed;Anxious Overall Cognitive Status: Within Functional Limits for tasks assessed                                 General Comments: pt with DOE 3/4 with return to supine, VSS, pt reports due to anxiety.      General Comments General comments (skin integrity, edema, etc.): bruising on R thigh, suspect due to fall.    Exercises     Assessment/Plan    PT Assessment Patient needs continued PT services  PT Problem List Decreased strength;Decreased range of motion;Decreased knowledge of use of DME;Decreased activity tolerance;Decreased balance;Decreased mobility;Pain;Obesity;Decreased safety awareness       PT  Treatment Interventions DME instruction;Neuromuscular re-education;Gait training;Balance training;Functional mobility training;Therapeutic activities;Therapeutic exercise    PT Goals (Current goals can be found in the Care Plan section)  Acute Rehab PT Goals Patient Stated Goal: get stronger, return to walking PT Goal Formulation: With patient Time For Goal Achievement: 12/16/18 Potential to Achieve Goals: Good    Frequency Min 2X/week   Barriers to discharge        Co-evaluation               AM-PAC PT "6 Clicks" Mobility  Outcome Measure Help needed turning from your back to your side while in a flat bed without using bedrails?: Total Help needed moving from lying on your back to sitting on the side of a flat bed without using bedrails?: Total Help needed moving to and from a bed to a chair (including a wheelchair)?: Total Help needed standing up from a chair using your arms (e.g., wheelchair or bedside chair)?: Total Help needed to walk in hospital room?: Total Help needed climbing 3-5 steps with a railing? : Total 6 Click Score: 6    End of Session Equipment Utilized During Treatment: Gait belt Activity Tolerance:  Patient limited by fatigue;Patient limited by pain Patient left: in bed;with bed alarm set;with call bell/phone within reach Nurse Communication: Mobility status PT Visit Diagnosis: Muscle weakness (generalized) (M62.81);Repeated falls (R29.6)    Time: 6789-3810 PT Time Calculation (min) (ACUTE ONLY): 28 min   Charges:   PT Evaluation $PT Eval Low Complexity: 1 Low PT Treatments $Therapeutic Activity: 8-22 mins       Sherissa Tenenbaum Conception Chancy, PT Acute Rehabilitation Services Pager [phone]  Office [phone]  Case Vassell D Shanique Aslinger 12/02/2018, 11:23 AM

## 2018-12-02 NOTE — ED Notes (Signed)
Pt wanted to be slid up in the bed and it required assist times 4. She was not able to assist with her own strength. Pt reports that she has had a wound on her left inner buttock that her home health aid has been treating with "some kind of salve". States that it is still there.  This writer attempted to visualize the wound, but unable to because unable to move pt with 2 people.

## 2018-12-02 NOTE — TOC Initial Note (Signed)
Transition of Care [facility] - Initial/Assessment Note    Patient Details  Name: [name] MRN: [id number] Date of Birth: May 26, 1940  Transition of Care [facility]) CM/SW Contact:    Claudine Mouton, LCSW Phone Number: 12/02/2018, 12:11 AM  Clinical Narrative: CSW spoke to pt/pt's son  Palmateer,[address] at ph:  [phone] who lives in [place] and works in the Emergency Dept and Med Surge at [place], [place].  PT is consulted for in the morning and CSW warned pt/pt's son against aving false hope of placement as pt states she fell but did not get it checked out at the PCP or hospital and there are no acute findings here today per the EDP.  CSW explained custodial and cautioned that pt may get no authorized days, 2-3 days, or 5-6 at best but that the CSW hoped for the best and that PT will be seeing the pt in the morning.  Pt's son and the pt were appeciative and thanked the CSW.   Patient stated if [facility] does not authorize any SNF days then the pt would be willing to private pay for SNF days.  2nd shift ED CSW will leave handoff for 1st shift ED CSW.          Expected Discharge Plan: Skilled Nursing Facility Barriers to Discharge: Insurance Authorization   Patient Goals and CMS Choice Patient states their goals for this hospitalization and ongoing recovery are:: Regain strength via rehab and then return home.      Expected Discharge Plan and Services Expected Discharge Plan: [place] In-house Referral: (Pt has an aide in the home during the days but not all day)     Living arrangements for the past 2 months: [place]                                      Prior Living Arrangements/Services Living arrangements for the past 2 months: Single Family Home Lives with:: Self Patient language and need for interpreter reviewed:: No Do you feel safe going back to the place where you live?: No      Need for Family Participation in Patient Care: Yes  (Comment) Care giver support system in place?: No (comment)      Activities of Daily Living      Permission Sought/Granted Permission sought to share information with : Facility Art therapist granted to share information with : Yes, Release of Information Signed              Emotional Assessment Appearance:: Appears stated age   Affect (typically observed): Accepting, Adaptable, Appropriate, Calm Orientation: : Oriented to Self, Oriented to Place, Oriented to  Time, Oriented to Situation, Fluctuating Orientation (Suspected and/or reported Sundowners)      Admission diagnosis:  leg pain Patient Active Problem List   Diagnosis Date Noted  . Pulmonary embolus ([place]anan)   . Pulmonary embolus, left ([place])   . Pulmonary embolus, right ([place])   . CKD (chronic kidney disease), stage I   . Bilateral pulmonary embolism ([place])   . PE (pulmonary embolism)   . Acute kidney injury superimposed on chronic kidney disease ([place]) 11/30/2015  . Depression 11/30/2015  . Essential hypertension 11/30/2015  . Acute respiratory failure with hypoxia ([place]) 11/30/2015  . Diabetes mellitus type 2 in obese ([place]) 11/30/2015  . Pulmonary embolism ([place]) 11/29/2015  . Pulmonary embolism, bilateral ([place]) 11/29/2015  . Pain  in lower limb 01/10/2015  . Malignant neoplasm of upper-outer quadrant of left breast in female, estrogen receptor positive ([place]) 01/24/2013  . Edema 01/09/2013  . Onychomycosis 01/09/2013  . Pain in joint, ankle and foot 01/09/2013   PCP:  Willey Blade, MD Pharmacy:   [facility] [address] [place]), [place] [address] [place] ([place]) [place] [address] Phone: [phone] Fax: [phone]  [facility] [place], [place] [address] [place] [address] Phone: [phone] Fax: [phone]     Social Determinants of Health (SDOH) Interventions    Readmission Risk  Interventions No flowsheet data found.

## 2018-12-02 NOTE — ED Notes (Signed)
Pt resting I will continue to monitor. 

## 2018-12-02 NOTE — Progress Notes (Signed)
CSW spoke to pt/pt's son  Tynika, [address] at ph:  [phone] who lives in [place] and works in the Emergency Dept and Med Surge at [place], [place].  PT is consulted for in the morning and CSW warned pt/pt's son against having false hope of placement as pt states she fell but did not get it checked out at the PCP or hospital and there are no acute findings here today, per the EDP.  CSW explained custodial staus and cautioned that pt may get no authorized days, 2-3 days, or 5-6 at best but that the CSW hoped for the best and that PT will be seeing the pt in the morning.  Pt's son and the pt were appeciative and thanked the CSW.   Patient stated if [facility] does not authorize any SNF days then the pt would be willing to private pay for SNF days.  2nd shift ED CSW  CSW will continue to follow for D/C needs.  Alphonse Guild. Wilkins Elpers, LCSW, LCAS, CSI Transitions of Care Clinical Social Worker Care Coordination Department Ph: [phone]

## 2018-12-02 NOTE — ED Notes (Signed)
Pt present for consult.

## 2018-12-02 NOTE — ED Notes (Signed)
Pt in bed resting. No complaints voiced. PureWick in place.

## 2018-12-02 NOTE — NC FL2 (Signed)
MEDICAID FL2 LEVEL OF CARE SCREENING TOOL     IDENTIFICATION  Patient Name: [name] Birthdate: 1940-09-26 Sex: female Admission Date (Current Location): 12/01/2018  [facility] [place] Number:  Herbalist and Address:  [facility],  [place], [name]      Provider Number: [id number]  Attending Physician Name and Address:  Carmin Muskrat, MD  Relative Name and Phone Number:  [facility] [address] Pandora Leiter   [phone] or [facility] Other   [phone]    Current Level of Care: Hospital Recommended Level of Care: [place] Prior Approval Number:    Date Approved/Denied:   PASRR Number: [id number]  Discharge Plan: SNF    Current Diagnoses: Patient Active Problem List   Diagnosis Date Noted  . Pulmonary embolus (Christine)   . Pulmonary embolus, left (Morris)   . Pulmonary embolus, right ([place])   . CKD (chronic kidney disease), stage I   . Bilateral pulmonary embolism ([place])   . PE (pulmonary embolism)   . Acute kidney injury superimposed on chronic kidney disease ([place]) 11/30/2015  . Depression 11/30/2015  . Essential hypertension 11/30/2015  . Acute respiratory failure with hypoxia ([place]) 11/30/2015  . Diabetes mellitus type 2 in obese ([place]) 11/30/2015  . Pulmonary embolism ([place]) 11/29/2015  . Pulmonary embolism, bilateral ([place]) 11/29/2015  . Pain in lower limb 01/10/2015  . Malignant neoplasm of upper-outer quadrant of left breast in female, estrogen receptor positive ([place]) 01/24/2013  . Edema 01/09/2013  . Onychomycosis 01/09/2013  . Pain in joint, ankle and foot 01/09/2013    Orientation RESPIRATION BLADDER Height & Weight     Self, Time, Situation, Place  Normal Continent Weight: 270 lb (122.5 kg) Height:  5\' 6"  (167.6 cm)  BEHAVIORAL SYMPTOMS/MOOD NEUROLOGICAL BOWEL NUTRITION STATUS      Continent Diet(Regular diet)  AMBULATORY STATUS COMMUNICATION OF NEEDS Skin   Limited Assist Verbally  Normal                       Personal Care Assistance Level of Assistance  Bathing, Dressing, Feeding Bathing Assistance: Limited assistance Feeding assistance: Independent Dressing Assistance: Limited assistance     Functional Limitations Info  Sight, Speech, Hearing Sight Info: Adequate Hearing Info: Adequate Speech Info: Adequate    SPECIAL CARE FACTORS FREQUENCY  PT (By licensed PT), OT (By licensed OT)     PT Frequency: Minimum 5x a week OT Frequency: Minimum 5x a week            Contractures Contractures Info: Not present    Additional Factors Info  Code Status, Psychotropic Code Status Info: Full Code   Psychotropic Info: buPROPion (WELLBUTRIN SR) 150 MG 12 hr tablet         Current Medications (12/02/2018):  This is the current hospital active medication list Current Facility-Administered Medications  Medication Dose Route Frequency Provider Last Rate Last Dose  . apixaban (ELIQUIS) tablet 10 mg  10 mg Oral BID Caccavale, Sophia, PA-C   10 mg at 12/02/18 0427  . buPROPion (WELLBUTRIN SR) 12 hr tablet 150 mg  150 mg Oral BID Caccavale, Sophia, PA-C   150 mg at 12/02/18 0427  . carisoprodol (SOMA) tablet 350 mg  350 mg Oral Q6H Caccavale, Sophia, PA-C   350 mg at 12/02/18 0832  . DULoxetine (CYMBALTA) DR capsule 20 mg  20 mg Oral Daily Caccavale, Sophia, PA-C      . exemestane (AROMASIN) tablet 25 mg  25  mg Oral Daily Caccavale, Sophia, PA-C      . fenofibrate tablet 160 mg  160 mg Oral Daily Caccavale, Sophia, PA-C      . gabapentin (NEURONTIN) capsule 300 mg  300 mg Oral TID Caccavale, Sophia, PA-C      . metFORMIN (GLUCOPHAGE) tablet 500 mg  500 mg Oral BID WC Caccavale, Sophia, PA-C   500 mg at 12/02/18 0831  . metoprolol succinate (TOPROL-XL) 24 hr tablet 50 mg  50 mg Oral BID Caccavale, Sophia, PA-C   50 mg at 12/02/18 0427  . spironolactone (ALDACTONE) tablet 50 mg  50 mg Oral Daily Caccavale, Sophia, PA-C       Current Outpatient Medications   Medication Sig Dispense Refill  . acetaminophen (TYLENOL) 650 MG CR tablet Take 1,300 mg by mouth daily as needed for pain.    Marland Kitchen AMBULATORY NON FORMULARY MEDICATION Take 1 mL by mouth daily. Ritualx Hemp Extract    . apixaban (ELIQUIS) 5 MG TABS tablet Take 2 tablets (10 mg total) by mouth 2 (two) times daily. Last dose will be on July 19 (Patient taking differently: Take 10 mg by mouth 2 (two) times daily. ) 4 tablet 0  . B-COMPLEX-C PO Take 1 tablet by mouth daily.    Marland Kitchen buPROPion (WELLBUTRIN SR) 150 MG 12 hr tablet Take 150 mg by mouth 2 (two) times daily.    . carisoprodol (SOMA) 350 MG tablet Take 350 mg by mouth every 6 (six) hours.     . DULoxetine (CYMBALTA) 20 MG capsule Take 1 capsule (20 mg total) by mouth daily. 30 capsule 3  . exemestane (AROMASIN) 25 MG tablet Take 1 tablet (25 mg total) by mouth daily. 90 tablet 3  . fenofibrate 160 MG tablet Take 160 mg by mouth daily.    Marland Kitchen gabapentin (NEURONTIN) 300 MG capsule TAKE 1 CAPSULE BY MOUTH AT BEDTIME (Patient taking differently: Take 300 mg by mouth 3 (three) times daily. ) 90 capsule 4  . Menthol, Topical Analgesic, (BIOFREEZE ROLL-ON) 4 % GEL Apply 1 application topically daily as needed (heel pain).    . Menthol, Topical Analgesic, (ICY HOT) 7.5 % (Roll) MISC Apply 1 each topically daily as needed (pain).    . Menthol-Zinc Oxide (CALMOSEPTINE) 0.44-20.6 % OINT Apply 1 application topically daily as needed (bottom sores).    . metFORMIN (GLUCOPHAGE) 500 MG tablet Take 1 tablet (500 mg total) by mouth 2 (two) times daily with a meal. 60 tablet 0  . metoprolol succinate (TOPROL-XL) 50 MG 24 hr tablet Take 50 mg by mouth 2 (two) times daily.     . Nutritional Supplements (QUINOA KALE & HEMP PO) Take 1 tablet by mouth daily.     Marland Kitchen Propylene Glycol (SYSTANE BALANCE OP) Apply 1 drop to eye daily as needed (dry eyes).    Marland Kitchen spironolactone (ALDACTONE) 50 MG tablet Take 50 mg by mouth every morning.       Discharge Medications: Please see  discharge summary for a list of discharge medications.  Relevant Imaging Results:  Relevant Lab Results:   Additional Information SSN  [id number]  Ross Ludwig, LCSW

## 2018-12-02 NOTE — ED Notes (Signed)
Patient requested to be cleaned up. Patient had a hard time turning to get cleaned up. Patient was 2 person assist to clean up. Patient was cleaned and new linen applied to bed.

## 2018-12-02 NOTE — ED Notes (Signed)
Pt repositioned and PureWick placed.

## 2018-12-03 LAB — SARS CORONAVIRUS 2 BY RT PCR (HOSPITAL ORDER, PERFORMED IN ~~LOC~~ HOSPITAL LAB): SARS Coronavirus 2: NEGATIVE

## 2018-12-03 MED ORDER — HYDROXYZINE HCL 25 MG PO TABS
25.0000 mg | ORAL_TABLET | Freq: Once | ORAL | Status: AC
  Administered 2018-12-03: 25 mg via ORAL
  Filled 2018-12-03: qty 1

## 2018-12-03 NOTE — Progress Notes (Signed)
CSW updated pt's son Dr. Vickki Muff, a Clinical Psychologist in [facility] in the ED's and Med Surge.  Pt was accepted to [facility] but the pt's son Dr. [name] would like for the CSW to call [place] to see if the pt can be accepted there in the morning first when the office opens.  CSW will leave a handoff for the ED CSW and for the RN to not send the pt until [facility] is able to verify they can or cannot take the pt and inform the pt and pt's son prior to D/C.  CSW will continue to follow for D/C needs.  Alphonse Guild. Lakeva Hollon, LCSW, LCAS, CSI Transitions of Care Clinical Social Worker Care Coordination Department Ph: [phone]

## 2018-12-03 NOTE — ED Notes (Signed)
Pt alert, calm, cooperative, no s/s of distress. Pt can feed self. Pt resting in bed comfortably , q2hr turns.

## 2018-12-03 NOTE — Progress Notes (Addendum)
CSW received a call from [place] at [facility] in [facility] stating the patient has been offered a bed and has been accepted and that the pt can arrive on 12/03/2018 once the pt's [facility] has been received by [facility] and after speaking to [place] in admissions at ph: [phone] .  The pt's accepting doctor is SNF MD.  The room number will be TBD.  The number for report is [phone].  Michelle Piper at ph:[phone] can be called to see if the Josem Kaufmann has been received.  CSW will update RN.  [name]. [name], Latanya Presser, LCAS Clinical Social Worker Ph: [phone]

## 2018-12-03 NOTE — Progress Notes (Signed)
CSW received a call from [place] at [place] who stated pt would need a negative COVID test before taking the pt tomorrow (7/12).  [place] at [facility] says she does have the insurance auth for the pt and that the only barrier is the negative COVID test.  EPD updated and COVID test initiated.  CSW will continue to follow for D/C needs.  Alphonse Guild. Peter Keyworth, LCSW, LCAS, CSI Transitions of Care Clinical Social Worker Care Coordination Department Ph: [phone]

## 2018-12-04 NOTE — ED Provider Notes (Signed)
7:55 AM-Daily note.  Last note, by provider, 3 days ago.  Yesterday social work noted that the patient had been accepted to a skilled nursing facility but the patient's son, is interested in having her placed in another facility.  Currently the plan is to contact the second facility this morning and see if she can be admitted there.   Daleen Bo, MD 12/04/18 1409

## 2018-12-04 NOTE — Progress Notes (Addendum)
9:02PM: CSW informed patient's son per [facility] they will not hear back regarding insurance authorization until tomorrow. CSW notes patient's son reports he has phone number to reach mother's room directly.  Evening ED CSW spoke with patient's son. CSW informed son he has reached out to see if insurance has been authorized. CSW informed patient's son at this point of the evening it may not be likely. CSW noted son's concerns with not having been able to speak with his mother. CSW informed ED RN.  Lamonte Richer, LCSW, [place] Worker II [phone]

## 2018-12-04 NOTE — Progress Notes (Addendum)
1pm: CSW received call from patient's son who stated he would prefer the patient go to [facility]. CSW spoke with Juliann Pulse at [facility] who stated there are still beds available and that the facility could accept the patient. CSW informed Juliann Pulse that the patient is COVID negative as of yesterday, temperature free, and does not require dialysis treatments. Juliann Pulse will obtain insurance authorization before this patient can be transferred, earliest it could be will be this afternoon. Juliann Pulse will return call to [place] as soon as authorization is obtained.  CSW updated patient's son who was extremely happy that his mother could go to [facility]. CSW explained process and son is totally agreeable.  10am: CSW spoke with patient's son Dr. Vickki Muff to inform him of no bed availability at [facility]. Dr. Vickki Muff still insisting that his mother cannot go to [facility]. CSW explained to Dr. Vickki Muff that she was accepted at other facilities and informed him of what they were. CSW sent information for accepting to facilities to Dr. Vickki Muff for review and a decision. As of now, the accepting facilities are [facility] [place], [place], [facility], [facility], and [facility].  9:30am: CSW spoke with Levada Dy at [facility] who stated that the facility has no bed availability at this time.   9am:  CSW received call from patient's son Dr. [name] who requested that CSW reach out to [facility] to determine if there was bed availability. Mr. Lezotte informed CSW that he was hesitant to send his mother to [facility] for rehab due to their lack of responsiveness to his inquiries and calls.

## 2018-12-05 LAB — CBG MONITORING, ED
Glucose-Capillary: 124 mg/dL — ABNORMAL HIGH (ref 70–99)
Glucose-Capillary: 195 mg/dL — ABNORMAL HIGH (ref 70–99)

## 2018-12-05 NOTE — Progress Notes (Addendum)
10:23a CSW received call that patient has been accepted. Patient will be going to room number 115. The number for reports is [phone]. Patient will need her AVS to shown to [facility] for updated/current medication list. EDP and RN made aware patient is ready for discharge. RN will call [facility] for transport.  CSW also spoke with patient about the plan. Patient had some questions regarding the facility and CSW answered them to the best of their ability. Patient was agreeable to go to [facility] and understood she would be transported via [facility].  8:56a CSW reached out to [facility] regarding insruance authorization. CSW spoke with Juliann Pulse who states she will follow up with CSW once the authorization has been received.   Golden Circle, LCSW Transitions of Care Department [facility] ED [phone]

## 2018-12-05 NOTE — Progress Notes (Signed)
12/05/2018  [facility] for transport.

## 2018-12-05 NOTE — Progress Notes (Signed)
12/05/2018 [facility] Report given to [facility].

## 2018-12-05 NOTE — ED Provider Notes (Signed)
10:40 AM daily note.  I was informed by social work that the patient has been accepted to [facility] care and is ready for discharge.  She does not need any medications ordered for me.   Hayden Rasmussen, MD 12/05/18 1040

## 2019-01-02 ENCOUNTER — Other Ambulatory Visit: Payer: Self-pay

## 2019-01-02 ENCOUNTER — Emergency Department (HOSPITAL_COMMUNITY): Payer: Medicare Other

## 2019-01-02 ENCOUNTER — Emergency Department (HOSPITAL_COMMUNITY)
Admission: EM | Admit: 2019-01-02 | Discharge: 2019-01-08 | Disposition: A | Discharge status: Skilled Nursing Facility | Payer: Medicare Other | Attending: Emergency Medicine | Admitting: Emergency Medicine

## 2019-01-02 DIAGNOSIS — Y9301 Activity, walking, marching and hiking: Secondary | ICD-10-CM | POA: Diagnosis not present

## 2019-01-02 DIAGNOSIS — I129 Hypertensive chronic kidney disease with stage 1 through stage 4 chronic kidney disease, or unspecified chronic kidney disease: Secondary | ICD-10-CM | POA: Diagnosis not present

## 2019-01-02 DIAGNOSIS — Z853 Personal history of malignant neoplasm of breast: Secondary | ICD-10-CM | POA: Diagnosis not present

## 2019-01-02 DIAGNOSIS — I2699 Other pulmonary embolism without acute cor pulmonale: Secondary | ICD-10-CM | POA: Diagnosis not present

## 2019-01-02 DIAGNOSIS — M25572 Pain in left ankle and joints of left foot: Secondary | ICD-10-CM

## 2019-01-02 DIAGNOSIS — Z7189 Other specified counseling: Secondary | ICD-10-CM | POA: Insufficient documentation

## 2019-01-02 DIAGNOSIS — Z8541 Personal history of malignant neoplasm of cervix uteri: Secondary | ICD-10-CM | POA: Diagnosis not present

## 2019-01-02 DIAGNOSIS — E1122 Type 2 diabetes mellitus with diabetic chronic kidney disease: Secondary | ICD-10-CM | POA: Insufficient documentation

## 2019-01-02 DIAGNOSIS — M25472 Effusion, left ankle: Secondary | ICD-10-CM | POA: Insufficient documentation

## 2019-01-02 DIAGNOSIS — Z96653 Presence of artificial knee joint, bilateral: Secondary | ICD-10-CM | POA: Insufficient documentation

## 2019-01-02 DIAGNOSIS — Y92019 Unspecified place in single-family (private) house as the place of occurrence of the external cause: Secondary | ICD-10-CM | POA: Diagnosis not present

## 2019-01-02 DIAGNOSIS — Z7984 Long term (current) use of oral hypoglycemic drugs: Secondary | ICD-10-CM | POA: Diagnosis not present

## 2019-01-02 DIAGNOSIS — Y999 Unspecified external cause status: Secondary | ICD-10-CM | POA: Insufficient documentation

## 2019-01-02 DIAGNOSIS — Z7901 Long term (current) use of anticoagulants: Secondary | ICD-10-CM | POA: Insufficient documentation

## 2019-01-02 DIAGNOSIS — Z20828 Contact with and (suspected) exposure to other viral communicable diseases: Secondary | ICD-10-CM | POA: Insufficient documentation

## 2019-01-02 DIAGNOSIS — S99912A Unspecified injury of left ankle, initial encounter: Secondary | ICD-10-CM | POA: Diagnosis present

## 2019-01-02 DIAGNOSIS — N181 Chronic kidney disease, stage 1: Secondary | ICD-10-CM | POA: Insufficient documentation

## 2019-01-02 DIAGNOSIS — Z79899 Other long term (current) drug therapy: Secondary | ICD-10-CM | POA: Diagnosis not present

## 2019-01-02 DIAGNOSIS — X501XXA Overexertion from prolonged static or awkward postures, initial encounter: Secondary | ICD-10-CM | POA: Diagnosis not present

## 2019-01-02 MED ORDER — SPIRONOLACTONE 25 MG PO TABS
50.0000 mg | ORAL_TABLET | Freq: Every day | ORAL | Status: DC
  Administered 2019-01-03 – 2019-01-08 (×6): 50 mg via ORAL
  Filled 2019-01-02 (×6): qty 2

## 2019-01-02 MED ORDER — CARISOPRODOL 350 MG PO TABS
350.0000 mg | ORAL_TABLET | Freq: Four times a day (QID) | ORAL | Status: DC
  Administered 2019-01-02 – 2019-01-08 (×23): 350 mg via ORAL
  Filled 2019-01-02 (×24): qty 1

## 2019-01-02 MED ORDER — GABAPENTIN 300 MG PO CAPS
300.0000 mg | ORAL_CAPSULE | Freq: Every day | ORAL | Status: DC
  Administered 2019-01-02 – 2019-01-07 (×6): 300 mg via ORAL
  Filled 2019-01-02 (×6): qty 1

## 2019-01-02 MED ORDER — ACETAMINOPHEN 500 MG PO TABS
1000.0000 mg | ORAL_TABLET | Freq: Three times a day (TID) | ORAL | Status: DC
  Administered 2019-01-02 – 2019-01-08 (×17): 1000 mg via ORAL
  Filled 2019-01-02 (×18): qty 2

## 2019-01-02 MED ORDER — METOPROLOL SUCCINATE ER 50 MG PO TB24
50.0000 mg | ORAL_TABLET | Freq: Two times a day (BID) | ORAL | Status: DC
  Administered 2019-01-02 – 2019-01-08 (×12): 50 mg via ORAL
  Filled 2019-01-02 (×14): qty 1

## 2019-01-02 MED ORDER — EXEMESTANE 25 MG PO TABS
25.0000 mg | ORAL_TABLET | Freq: Every day | ORAL | Status: DC
  Administered 2019-01-03 – 2019-01-08 (×6): 25 mg via ORAL
  Filled 2019-01-02 (×6): qty 1

## 2019-01-02 MED ORDER — DULOXETINE HCL 20 MG PO CPEP
20.0000 mg | ORAL_CAPSULE | Freq: Every day | ORAL | Status: DC
  Administered 2019-01-03 – 2019-01-08 (×6): 20 mg via ORAL
  Filled 2019-01-02 (×6): qty 1

## 2019-01-02 MED ORDER — MENTHOL (TOPICAL ANALGESIC) 4 % EX GEL: 1.0000 "application " | Freq: Every day | CUTANEOUS | Status: DC | PRN

## 2019-01-02 MED ORDER — METFORMIN HCL 500 MG PO TABS
500.0000 mg | ORAL_TABLET | Freq: Two times a day (BID) | ORAL | Status: DC
  Administered 2019-01-03 – 2019-01-08 (×12): 500 mg via ORAL
  Filled 2019-01-02 (×12): qty 1

## 2019-01-02 MED ORDER — APIXABAN 5 MG PO TABS
5.0000 mg | ORAL_TABLET | Freq: Two times a day (BID) | ORAL | Status: DC
  Administered 2019-01-02 – 2019-01-08 (×12): 5 mg via ORAL
  Filled 2019-01-02 (×14): qty 1

## 2019-01-02 MED ORDER — BUPROPION HCL ER (SR) 150 MG PO TB12
150.0000 mg | ORAL_TABLET | Freq: Two times a day (BID) | ORAL | Status: DC
  Administered 2019-01-02 – 2019-01-08 (×12): 150 mg via ORAL
  Filled 2019-01-02 (×12): qty 1

## 2019-01-02 MED ORDER — ZINC OXIDE 40 % EX OINT: TOPICAL_OINTMENT | Freq: Every day | CUTANEOUS | Status: DC | PRN

## 2019-01-02 MED ORDER — MENTHOL-ZINC OXIDE 0.44-20.6 % EX OINT: 1.0000 "application " | TOPICAL_OINTMENT | Freq: Every day | CUTANEOUS | Status: DC | PRN

## 2019-01-02 MED ORDER — FENOFIBRATE 160 MG PO TABS
160.0000 mg | ORAL_TABLET | Freq: Every day | ORAL | Status: DC
  Administered 2019-01-03 – 2019-01-08 (×6): 160 mg via ORAL
  Filled 2019-01-02 (×6): qty 1

## 2019-01-02 MED ORDER — MUSCLE RUB 10-15 % EX CREA: TOPICAL_CREAM | CUTANEOUS | Status: DC | PRN

## 2019-01-02 NOTE — ED Notes (Addendum)
Pt provided with [place], Water and a warm blanket.

## 2019-01-02 NOTE — ED Notes (Signed)
Pt placed on BSC x1 assisting.

## 2019-01-02 NOTE — ED Provider Notes (Signed)
[facility] DEPT Provider Note   CSN: [id number] Arrival date & time: 01/02/19  1704     History   Chief Complaint Chief Complaint  Patient presents with  . Ankle Pain    left    HPI [name] is a 78 y.o. female.     78 year old female who presents after twisting her left ankle.  Was just discharged from rehab today and felt that she should probably of not going home when she she was walking today with socks that were coated with a sticky substance got caught on the carpet.  She twisted her ankle but denies any head or neck trauma.  No back or new hip pain.  Has been using a walker as of late.     Past Medical History:  Diagnosis Date  . Allergy   . Arthritis    "joints; mostly on the right side" (01/11/2013)  . Breast cancer ([place])    "left" (01/11/2013)  . Cancer [facility])    endometrial  . Chronic lower back pain   . Cold    just started 01/02/13 cough  . Complication of anesthesia    very phobic about needles.last surgery had to sedate before doing iv  . Depression   . Exertional shortness of breath   . Hyperlipidemia   . Hypertension   . Osteoporosis    knees   . Personal history of radiation therapy 03/2013   left  . Status post radiation therapy within last four weeks 03/19/13-05/03/13   lt breast 60.4GY  . Uterine cancer [facility])     Patient Active Problem List   Diagnosis Date Noted  . Pulmonary embolus ([place])   . Pulmonary embolus, left ([place])   . Pulmonary embolus, right ([place])   . CKD (chronic kidney disease), stage I   . Bilateral pulmonary embolism ([place])   . PE (pulmonary embolism)   . Acute kidney injury superimposed on chronic kidney disease ([place]) 11/30/2015  . Depression 11/30/2015  . Essential hypertension 11/30/2015  . Acute respiratory failure with hypoxia ([place]) 11/30/2015  . Diabetes mellitus type 2 in obese ([place]) 11/30/2015  . Pulmonary embolism ([place]) 11/29/2015  . Pulmonary embolism, bilateral ([place])  11/29/2015  . Pain in lower limb 01/10/2015  . Malignant neoplasm of upper-outer quadrant of left breast in female, estrogen receptor positive ([place]) 01/24/2013  . Edema 01/09/2013  . Onychomycosis 01/09/2013  . Pain in joint, ankle and foot 01/09/2013    Past Surgical History:  Procedure Laterality Date  . ABDOMINAL HYSTERECTOMY  12/14/2012  . BREAST BIOPSY Left 12/2012  . BREAST LUMPECTOMY Left 2014  . BREAST LUMPECTOMY WITH NEEDLE LOCALIZATION AND AXILLARY SENTINEL LYMPH NODE BX Left 01/11/2013  . BREAST LUMPECTOMY WITH NEEDLE LOCALIZATION AND AXILLARY SENTINEL LYMPH NODE BX Left 01/11/2013   Procedure: BREAST LUMPECTOMY WITH NEEDLE LOCALIZATION AND AXILLARY SENTINEL LYMPH NODE BX;  Surgeon: Rolm Bookbinder, MD;  Location: [name];  Service: General;  Laterality: Left;  . CHOLECYSTECTOMY  1970  . DILATION AND CURETTAGE OF UTERUS     "@ least one" (01/11/2013)  . FOOT TENDON SURGERY Right   . KNEE ARTHROSCOPY Right 1990's   "twice" (01/11/2013)  . REPLACEMENT TOTAL KNEE Bilateral 2011-2012  . TUBAL LIGATION       OB History   No obstetric history on file.      Home Medications    Prior to Admission medications   Medication Sig Start Date End Date Taking? Authorizing Provider  acetaminophen (TYLENOL) 650  MG CR tablet Take 1,300 mg by mouth daily as needed for pain.    [provider]  AMBULATORY NON FORMULARY MEDICATION Take 1 mL by mouth daily. Ritualx Hemp Extract    [provider]  apixaban (ELIQUIS) 5 MG TABS tablet Take 5 mg by mouth 2 (two) times daily.    [provider]  B-COMPLEX-C PO Take 1 tablet by mouth daily.    [provider]  buPROPion (WELLBUTRIN SR) 150 MG 12 hr tablet Take 150 mg by mouth 2 (two) times daily. 11/11/15   [provider]  carisoprodol (SOMA) 350 MG tablet Take 350 mg by mouth every 6 (six) hours.  12/15/12   [provider]  DULoxetine (CYMBALTA) 20 MG capsule Take 1 capsule (20 mg total) by  mouth daily. 08/17/13   Gardenia Phlegm, NP  exemestane (AROMASIN) 25 MG tablet Take 1 tablet (25 mg total) by mouth daily. 03/16/18   Magrinat, Virgie Dad, MD  fenofibrate 160 MG tablet Take 160 mg by mouth daily. 10/02/15   [provider]  gabapentin (NEURONTIN) 300 MG capsule TAKE 1 CAPSULE BY MOUTH AT BEDTIME Patient taking differently: Take 300 mg by mouth 3 (three) times daily.  02/15/18   Magrinat, Virgie Dad, MD  Menthol, Topical Analgesic, (BIOFREEZE ROLL-ON) 4 % GEL Apply 1 application topically daily as needed (heel pain).    [provider]  Menthol, Topical Analgesic, (ICY HOT) 7.5 % (Roll) MISC Apply 1 each topically daily as needed (pain).    [provider]  Menthol-Zinc Oxide (CALMOSEPTINE) 0.44-20.6 % OINT Apply 1 application topically daily as needed (bottom sores).    [provider]  metFORMIN (GLUCOPHAGE) 500 MG tablet Take 1 tablet (500 mg total) by mouth 2 (two) times daily with a meal. 12/09/15   [name], MD  metoprolol succinate (TOPROL-XL) 50 MG 24 hr tablet Take 50 mg by mouth 2 (two) times daily.  12/19/12   [provider]  Nutritional Supplements (QUINOA KALE & HEMP PO) Take 1 tablet by mouth daily.     [provider]  Propylene Glycol (SYSTANE BALANCE OP) Apply 1 drop to eye daily as needed (dry eyes).    [provider]  spironolactone (ALDACTONE) 50 MG tablet Take 50 mg by mouth every morning. 04/24/17   [provider]    Family History Family History  Problem Relation Age of Onset  . Breast cancer Mother 4  . Esophageal cancer Neg Hx   . Colon cancer Neg Hx   . Stomach cancer Neg Hx   . Rectal cancer Neg Hx     Social History Social History   Tobacco Use  . Smoking status: Never Smoker  . Smokeless tobacco: Never Used  Substance Use Topics  . Alcohol use: No    Alcohol/week: 0.0 standard drinks  . Drug use: No     Allergies   Iodine   Review of  Systems Review of Systems  All other systems reviewed and are negative.    Physical Exam Updated Vital Signs BP 122/66   Pulse 88   Temp 98 F (36.7 C) (Oral)   Resp 18   Ht 1.676 m (5\' 6" )   Wt 129.3 kg   SpO2 96%   BMI 46.00 kg/m   Physical Exam Vitals signs and nursing note reviewed.  Constitutional:      General: She is not in acute distress.    Appearance: Normal appearance. She is well-developed. She is  not toxic-appearing.  HENT:     Head: Normocephalic and atraumatic.  Eyes:     General: Lids are normal.     Conjunctiva/sclera: Conjunctivae normal.     Pupils: Pupils are equal, round, and reactive to light.  Neck:     Musculoskeletal: Normal range of motion and neck supple.     Thyroid: No thyroid mass.     Trachea: No tracheal deviation.  Cardiovascular:     Rate and Rhythm: Normal rate and regular rhythm.     Heart sounds: Normal heart sounds. No murmur. No gallop.   Pulmonary:     Effort: Pulmonary effort is normal. No respiratory distress.     Breath sounds: Normal breath sounds. No stridor. No decreased breath sounds, wheezing, rhonchi or rales.  Abdominal:     General: Bowel sounds are normal. There is no distension.     Palpations: Abdomen is soft.     Tenderness: There is no abdominal tenderness. There is no rebound.  Musculoskeletal: Normal range of motion.     Left ankle: She exhibits normal range of motion and no swelling. Tenderness.       Feet:  Skin:    General: Skin is warm and dry.     Findings: No abrasion or rash.  Neurological:     Mental Status: She is alert and oriented to person, place, and time.     GCS: GCS eye subscore is 4. GCS verbal subscore is 5. GCS motor subscore is 6.     Cranial Nerves: No cranial nerve deficit.     Sensory: No sensory deficit.  Psychiatric:        Speech: Speech normal.        Behavior: Behavior normal.      ED Treatments / Results  Labs (all labs ordered are listed, but only abnormal results  are displayed) Labs Reviewed - No data to display  EKG None  Radiology No results found.  Procedures Procedures (including critical care time)  Medications Ordered in ED Medications - No data to display   Initial Impression / Assessment and Plan / ED Course  I have reviewed the triage vital signs and the nursing notes.  Pertinent labs & imaging results that were available during my care of the patient were reviewed by me and considered in my medical decision making (see chart for details).        Patient is x-ray noted and placed in splint by orthopedic tech for comfort.  Patient states that she cannot take care of her self at home like this.  She was just discharged from rehab today.  Is requesting to go back to her facility.  Case management consulted and home meds reordered  Final Clinical Impressions(s) / ED Diagnoses   Final diagnoses:  None    ED Discharge Orders    None       Lacretia Leigh, MD 01/02/19 2024

## 2019-01-02 NOTE — ED Triage Notes (Addendum)
Pt to ED via EMS from home. Pt c/o fall 4 hours ago , No LOC, Resulting in left ankle pain. Pt just finished rehab today d/t recent knee sx. Small amount of swelling noted , pt able to move foot, but not bare weight.

## 2019-01-02 NOTE — ED Notes (Signed)
Patient transported to X-ray 

## 2019-01-03 LAB — SARS CORONAVIRUS 2 BY RT PCR (HOSPITAL ORDER, PERFORMED IN ~~LOC~~ HOSPITAL LAB): SARS Coronavirus 2: NEGATIVE

## 2019-01-03 NOTE — TOC Initial Note (Addendum)
Transition of Care [facility]) - Initial/Assessment Note    Patient Details  Name: [name] MRN: [id number] Date of Birth: 1940-06-22  Transition of Care [facility]) CM/SW Contact:    Janace Hoard, LCSW Phone Number: 01/03/2019, 11:18 AM  Clinical Narrative:                 12:05p CSW received call back from [facility] and stated that they will not accept patient back. CSW did call [place] office who stated that patient may quality for covered SNF services, but will need a new authorization. The business office rep stated that patient was trying to stay at [facility] longer, but was denied by insurance and there is a second appeal that has not been responded to.   CSW contacted patient's son about this. Dr. Vickki Muff expressed frustration with his mother having to go through the whole process again in the ED for placement. He asked about ALF and CSW explained that that service will be out of pocket unless patient could qualify for Medicaid or would have to go through the spend down process to qualify. CSW recommended Dr. Vickki Muff work on ALF as a discharge plan from SNF if she is eligible to go back.  CSW contacted EDP for PT consult and COVID test.  10:35a CSW received call from patient's son Dr. [name] regarding patient's needs. Dr. Vickki Muff is hoping that patient can be admitted back into [facility] or into another facility (he stated [facility]). CSW explained that we have reached out to [facility] and awaiting there decision. CSW also explained that because this is a new issue that patient will have to go through the entire placement process and that because patient was just in SNF her insurance may not cover the cost. CSW awaiting call back from [facility].   10:10a CSW spoke with patient at bedside. Patient reports she d/c from [facility] on yesterday. Patient then went home and twisted her left ankle from  walking on a new rug with grip socks. Patient is wanting to go back to [facility] to continue rehab. CSW explained to patient that because she was d/c she will be a [facility] admit [facility] and also that her insurance will have to approve her to go back. CSW explained that if she is not approved by her insurance, she may have to return home with [facility]. CSW reached out to [facility] and is awaiting a call back.  [place], LCSW Transitions of Care Department [facility] ED [phone]  Expected Discharge Plan: [place] Barriers to Discharge: Insurance Authorization, SNF Pending bed offer   Patient Goals and CMS Choice Patient states their goals for this hospitalization and ongoing recovery are:: Return to rehab CMS Medicare.gov Compare Post Acute Care list provided to:: Patient Choice offered to / list presented to : Patient, Adult Children  Expected Discharge Plan and Services Expected Discharge Plan: [place] Choice: [place] arrangements for the past 2 months: Single Family Home                                      Prior Living Arrangements/Services Living arrangements for the past 2 months: Single Family Home Lives with:: Self Patient language and need for interpreter reviewed::  Yes Do you feel safe going back to the place where you live?: No   due to recent fall, not able to care for self  Need for Family Participation in Patient Care: Yes (Comment) Care giver support system in place?: Yes (comment)      Activities of Daily Living      Permission Sought/Granted Permission sought to share information with : Facility Art therapist granted to share information with : Yes, Verbal Permission Granted  Share Information with NAME: Dr. [name]  Permission granted to share info w AGENCY: [facility], SNF facilties  Permission  granted to share info w Relationship: son  Permission granted to share info w Contact Information: [phone]  Emotional Assessment Appearance:: Appears stated age Attitude/Demeanor/Rapport: Engaged Affect (typically observed): Accepting, Adaptable, Appropriate, Calm Orientation: : Oriented to Self, Oriented to Place, Oriented to  Time, Oriented to Situation      Admission diagnosis:  Left Ankle Pain due to Fall Patient Active Problem List   Diagnosis Date Noted  . Pulmonary embolus ([place])   . Pulmonary embolus, left ([place])   . Pulmonary embolus, right ([place])   . CKD (chronic kidney disease), stage I   . Bilateral pulmonary embolism ([place])   . PE (pulmonary embolism)   . Acute kidney injury superimposed on chronic kidney disease ([place]) 11/30/2015  . Depression 11/30/2015  . Essential hypertension 11/30/2015  . Acute respiratory failure with hypoxia ([place]) 11/30/2015  . Diabetes mellitus type 2 in obese ([place]) 11/30/2015  . Pulmonary embolism ([place]) 11/29/2015  . Pulmonary embolism, bilateral ([place]) 11/29/2015  . Pain in lower limb 01/10/2015  . Malignant neoplasm of upper-outer quadrant of left breast in female, estrogen receptor positive ([place]) 01/24/2013  . Edema 01/09/2013  . Onychomycosis 01/09/2013  . Pain in joint, ankle and foot 01/09/2013   PCP:  Willey Blade, MD Pharmacy:   [facility] [address] [place]), [place] [address] [place] [address] Phone: [phone] Fax: [phone]  [facility] [place], [place] [address] [place] [address] Phone: [phone] Fax: [phone]     Social Determinants of Health (SDOH) Interventions    Readmission Risk Interventions No flowsheet data found.

## 2019-01-03 NOTE — ED Notes (Signed)
[place] canister changed at this time. No patient needs identified at this time.

## 2019-01-03 NOTE — ED Notes (Signed)
Social work at bedside speaking with patient at this time.

## 2019-01-03 NOTE — NC FL2 (Signed)
[place] MEDICAID FL2 LEVEL OF CARE SCREENING TOOL     IDENTIFICATION  Patient Name: [name] Birthdate: 1941/04/01 Sex: female Admission Date (Current Location): 01/02/2019  [facility] and [place] Number:  Herbalist and Address:  [facility] [place],  [place] [address], [place]      Provider Number: [id number]  Attending Physician Name and Address:  Default, Provider, MD  Relative Name and Phone Number:       Current Level of Care: Hospital Recommended Level of Care: [place] Prior Approval Number:    Date Approved/Denied:   PASRR Number: [id number]  Discharge Plan: SNF    Current Diagnoses: Patient Active Problem List   Diagnosis Date Noted  . Pulmonary embolus ([place])   . Pulmonary embolus, left ([place])   . Pulmonary embolus, right ([place])   . CKD (chronic kidney disease), stage I   . Bilateral pulmonary embolism ([place])   . PE (pulmonary embolism)   . Acute kidney injury superimposed on chronic kidney disease ([place]) 11/30/2015  . Depression 11/30/2015  . Essential hypertension 11/30/2015  . Acute respiratory failure with hypoxia ([place]) 11/30/2015  . Diabetes mellitus type 2 in obese ([place]) 11/30/2015  . Pulmonary embolism ([place]) 11/29/2015  . Pulmonary embolism, bilateral ([place]) 11/29/2015  . Pain in lower limb 01/10/2015  . Malignant neoplasm of upper-outer quadrant of left breast in female, estrogen receptor positive ([place]) 01/24/2013  . Edema 01/09/2013  . Onychomycosis 01/09/2013  . Pain in joint, ankle and foot 01/09/2013    Orientation RESPIRATION BLADDER Height & Weight     Self, Time, Situation, Place  Normal Incontinent Weight: 285 lb (129.3 kg) Height:  5\' 6"  (167.6 cm)  BEHAVIORAL SYMPTOMS/MOOD NEUROLOGICAL BOWEL NUTRITION STATUS      Incontinent Diet(Heart Healthy)  AMBULATORY STATUS COMMUNICATION OF NEEDS Skin   Limited Assist Verbally Normal                       Personal Care Assistance Level of  Assistance  Bathing, Dressing Bathing Assistance: Limited assistance Feeding assistance: Independent Dressing Assistance: Limited assistance     Functional Limitations Info    Sight Info: Adequate Hearing Info: Adequate Speech Info: Adequate    SPECIAL CARE FACTORS FREQUENCY  PT (By licensed PT), OT (By licensed OT)     PT Frequency: 5 OT Frequency: 5            Contractures Contractures Info: Not present    Additional Factors Info  Code Status, Allergies Code Status Info: Prior Allergies Info: Iodine           Current Medications (01/03/2019):  This is the current hospital active medication list Current Facility-Administered Medications  Medication Dose Route Frequency Provider Last Rate Last Dose  . acetaminophen (TYLENOL) tablet 1,000 mg  1,000 mg Oral Q8H Lacretia Leigh, MD   1,000 mg at 01/03/19 1405  . apixaban (ELIQUIS) tablet 5 mg  5 mg Oral BID Lacretia Leigh, MD   5 mg at 01/03/19 1028  . buPROPion (WELLBUTRIN SR) 12 hr tablet 150 mg  150 mg Oral BID Lacretia Leigh, MD   150 mg at 01/03/19 1029  . carisoprodol (SOMA) tablet 350 mg  350 mg Oral Q6H Lacretia Leigh, MD   350 mg at 01/03/19 1405  . DULoxetine (CYMBALTA) DR capsule 20 mg  20 mg Oral Daily Lacretia Leigh, MD   20 mg at 01/03/19 1028  . exemestane (AROMASIN) tablet 25 mg  25 mg  Oral Daily Lacretia Leigh, MD   25 mg at 01/03/19 1028  . fenofibrate tablet 160 mg  160 mg Oral Daily Lacretia Leigh, MD   160 mg at 01/03/19 1029  . gabapentin (NEURONTIN) capsule 300 mg  300 mg Oral QHS Lacretia Leigh, MD   300 mg at 01/02/19 2229  . liver oil-zinc oxide (DESITIN) 40 % ointment   Topical Daily PRN Lacretia Leigh, MD      . metFORMIN (GLUCOPHAGE) tablet 500 mg  500 mg Oral BID WC Lacretia Leigh, MD   500 mg at 01/03/19 1726  . metoprolol succinate (TOPROL-XL) 24 hr tablet 50 mg  50 mg Oral BID Lacretia Leigh, MD   50 mg at 01/03/19 1028  . Muscle Rub CREA   Topical PRN Lacretia Leigh, MD      .  spironolactone (ALDACTONE) tablet 50 mg  50 mg Oral Daily Lacretia Leigh, MD   50 mg at 01/03/19 1029   Current Outpatient Medications  Medication Sig Dispense Refill  . acetaminophen (TYLENOL) 650 MG CR tablet Take 1,300 mg by mouth daily as needed for pain.    Marland Kitchen AMBULATORY NON FORMULARY MEDICATION Take 1 mL by mouth daily. Ritualx Hemp Extract    . apixaban (ELIQUIS) 5 MG TABS tablet Take 5 mg by mouth 2 (two) times daily.    . B-COMPLEX-C PO Take 1 tablet by mouth daily.    Marland Kitchen buPROPion (WELLBUTRIN SR) 150 MG 12 hr tablet Take 150 mg by mouth 2 (two) times daily.    . carisoprodol (SOMA) 350 MG tablet Take 350 mg by mouth 2 (two) times daily.     . DULoxetine (CYMBALTA) 20 MG capsule Take 1 capsule (20 mg total) by mouth daily. 30 capsule 3  . exemestane (AROMASIN) 25 MG tablet Take 1 tablet (25 mg total) by mouth daily. (Patient taking differently: Take 25 mg by mouth 2 (two) times daily. ) 90 tablet 3  . fenofibrate 160 MG tablet Take 160 mg by mouth daily.    . furosemide (LASIX) 20 MG tablet Take 40 mg by mouth daily as needed for fluid.    Marland Kitchen gabapentin (NEURONTIN) 300 MG capsule TAKE 1 CAPSULE BY MOUTH AT BEDTIME (Patient taking differently: Take 300 mg by mouth 2 (two) times daily. ) 90 capsule 4  . Menthol, Topical Analgesic, (BIOFREEZE ROLL-ON) 4 % GEL Apply 1 application topically daily as needed (heel pain).    . Menthol, Topical Analgesic, (ICY HOT) 7.5 % (Roll) MISC Apply 1 each topically daily as needed (pain).    . metFORMIN (GLUCOPHAGE) 500 MG tablet Take 1 tablet (500 mg total) by mouth 2 (two) times daily with a meal. 60 tablet 0  . metoprolol succinate (TOPROL-XL) 50 MG 24 hr tablet Take 50 mg by mouth 2 (two) times daily.     . Nutritional Supplements (QUINOA KALE & HEMP PO) Take 1 tablet by mouth daily.     Marland Kitchen Propylene Glycol (SYSTANE BALANCE OP) Apply 1 drop to eye daily as needed (dry eyes).    Marland Kitchen spironolactone (ALDACTONE) 50 MG tablet Take 50 mg by mouth every morning.        Discharge Medications: Please see discharge summary for a list of discharge medications.  Relevant Imaging Results:  Relevant Lab Results:   Additional Information [id number]  Alphonse Guild [place], LCSW

## 2019-01-03 NOTE — Progress Notes (Addendum)
FL-2 with PASRR signed and sent to [facility] SNF facilities.  Pt/pt's son prefers [facility] SN[facility]. CSW cautioned pt's son against having false hope that placement will take place and discussed having a discussion with the pr about LTC placement once pt is D/C'd from the SNF or from the hospital and returns home.  Pt's son stated he was hoping to begin that process originally once the COVID pandemic "is cured", per the pt's son.  CSW discussed the barriers of ALF placement rom the ED and why the ED does not facilitate placement into an ALF and pt's son voiced understanding.  CSW called [facility] at [phone] ans was unable to reach admissions but left a .hippa-compliant message asking for a return call.  CSW will continue to follow for D/C needs.  [name]. Jasir Rother, LCSW, LCAS, CSI Transitions of Care Clinical Social Worker Care Coordination Department Ph: [phone]

## 2019-01-03 NOTE — Progress Notes (Addendum)
2nd shift ED CSW received a handoff from the 1st shift [facility] ED CSW.   CSW called [facility] SN[facility] (pt/pt's son';s first choice as they just left there) back to see if pt can return if a new authorization is provided by [facility] and [facility] SNF stated no for the following reasons(s):  1. No beds available (in addition pt was a factor in some "issues" that arose).  Per handoff, pt is deferring to her son to assist in making decisions regarding placement.  CSW called pt's son who stated his email is:  [email or web address]   Per pt's son, pt's/pt's son's preferences are:  [facility]    Pt's son states pt going to a facility with COVID is not a viable option for the pt.  CSW reviewed chart and sees pt has a PASRR of:  [id number].  CSW spoke with pt's son who the pt deferred to for discharge issues and confirmed pt's plan to be discharged to SNF to rehab at discharge.  CSW provided active listening and validated pt's son's concerns pt not be sent to a COVID facility.   CSW was given permsission to complete FL-2 and send referrals out to SNF facilities in the [place] area, via the hub per pt's son's request.  Pt has been at [facility] for rehab 21 days prior to being admitted to [facility] ED.  CSW will continue to follow for D/C needs.  Alphonse Guild. Amer Alcindor, LCSW, LCAS, CSI Transitions of Care Clinical Social Worker Care Coordination Department Ph: [phone]

## 2019-01-03 NOTE — ED Notes (Signed)
Pt placed on Pure Wick

## 2019-01-03 NOTE — Progress Notes (Signed)
Received [name] from the main ED, she was oriented to her new environment. She was medicated per order without incident. She was awake  the entire night watching TV, per pt ,this is her normal routine.

## 2019-01-04 MED ORDER — IBUPROFEN 200 MG PO TABS: 600.0000 mg | ORAL_TABLET | Freq: Once | ORAL | Status: DC

## 2019-01-04 NOTE — Progress Notes (Addendum)
3:00p CSW received call from [facility] with [facility] about bed availability. Claiborne Billings reports there will not be an open bed until tomorrow. Claiborne Billings reports that she will start working on [employer] authorization today.   1:27p CSW spoke with patient's son to provide him with an update on bed offers. CSW informed patient's son that [facility] and [facility] both declined patient. CSW provided patient's son with current bed offers. Patient's son looked over the medicare.gov compare list and chose [facility] SNF. CSW to call and check on current bed availability.   Golden Circle, LCSW Transitions of Care Department [facility] ED [phone]

## 2019-01-04 NOTE — Evaluation (Signed)
Physical Therapy Evaluation Patient Details Name: [name] MRN: [id number] DOB: 1940-10-22 Today's Date: 01/04/2019   History of Present Illness  Pt admit to ED s/p fall at home. Pt had just left a SNF where she was receiving rehab to work on mobility, however pt states once she arrived home she tried to stand up and walk using RW and fell. Pt states she twisted her L ankle and hit her L knee. Pt currently has a ankle lace up wrap on L ankle for comfort and stability.  Clinical Impression  Pt welcomed to work with PT during the assessment and session. Worked hard, however it is very challenging to move independent and required a lot of assist with all mobility, rolling, supine to sit and attempted to stand but unable to lift off. Wants to continue to work and walk with PT ans assistance, however continues to have reoccurring falls when alone, so recommend SNF and looking to long term assist for safety. Will continue to follow while here.     Follow Up Recommendations SNF;Supervision/Assistance - 24 hour    Equipment Recommendations  None recommended by PT    Recommendations for Other Services       Precautions / Restrictions Precautions Precautions: Fall Required Braces or Orthoses: Other Brace Other Brace: L ankle brace at the moment for L ankle sprain on 01/03/2019      Mobility  Bed Mobility Overal bed mobility: Needs Assistance Bed Mobility: Rolling;Supine to Sit;Sit to Supine Rolling: Max assist;+2 for physical assistance   Supine to sit: Max assist;+2 for safety/equipment Sit to supine: HOB elevated;Max assist;+2 for safety/equipment   General bed mobility comments: Max assist for all bed mobility of 2 person, for scooting up in bed and use of rails and trendelenberg to accomplish this. Sat EOB with PT at supervision, pt was able to sit statically EOB for 5 minutes , and attempted scoot to the right as well.  Transfers Overall transfer level: Needs  assistance Equipment used: Rolling walker (2 wheeled)   Sit to Stand: Total assist;+2 physical assistance;+2 safety/equipment;From elevated surface         General transfer comment: Sit to stand attempt x2. Pt required total assist +3 to attempt power up, hip extension to come to standing. Pt unable to come to full standing on both attempts, returned to supine.  Ambulation/Gait                Stairs            Wheelchair Mobility    Modified Rankin (Stroke Patients Only)       Balance Overall balance assessment: Needs assistance;History of Falls Sitting-balance support: Bilateral upper extremity supported;Feet supported Sitting balance-Leahy Scale: Good     Standing balance support: Bilateral upper extremity supported Standing balance-Leahy Scale: Zero Standing balance comment: required total assist to attempt to stand, unable to attempt without total assist                             Pertinent Vitals/Pain Pain Assessment: 0-10 Pain Score: 5  Pain Location: pt has lots of pain in her knees and hips with very little movment ( seems fearful of movment a little) more pain in L LE than the right. Pain upon palpation of L lateral foot and lateral aspect Pain Descriptors / Indicators: Sore Pain Intervention(s): Monitored during session;Repositioned(elevated R LE due to pain on posterior heel, however not red at this time.)  Home Living Family/patient expects to be discharged to:: Private residence Living Arrangements: Alone Available Help at Discharge: Home health Type of Home: House Home Access: [place]: One [place]: Clinical cytogeneticist - 2 wheels;Grab bars - toilet;Grab bars - tub/shower Additional Comments: pt has assist with bathing 3x/ wk, Just discharged from SNF , and within hours had a fall in her home.    Prior Function Level of Independence: Needs assistance   Gait / Transfers Assistance Needed: Pt  still requires assit to sit to stand and asssit with RW for ambulation from PT.  ADL's / Homemaking Assistance Needed: Pt reports receiving assist for ADLs from home health aide, including bathing, dressing, and cooking meals.        Hand Dominance        Extremity/Trunk Assessment        Lower Extremity Assessment RLE Deficits / Details: MMT: hip flexion 2/5, knee extension 2/5, knee flexion 2/5, PF/DF WNL LLE Deficits / Details: MMT: hip flexion 2/5, knee extension 2/5, knee flexion 2/5, PF/DF limited due to pain       Communication   Communication: No difficulties  Cognition Arousal/Alertness: Awake/alert Behavior During Therapy: WFL for tasks assessed/performed;Anxious Overall Cognitive Status: Within Functional Limits for tasks assessed                                        General Comments      Exercises Other Exercises Other Exercises: worked supine with BLE for ankle pumps, SLR, hip ab/ad, and knee flexion. All AAROM with a lot of stiffness with all of this movement and idfficult to assess supine. Sitting EOB pt had more knee flexion and extension in sitting.   Assessment/Plan    PT Assessment Patient needs continued PT services  PT Problem List Decreased strength;Decreased range of motion;Decreased knowledge of use of DME;Decreased activity tolerance;Decreased balance;Decreased mobility;Obesity       PT Treatment Interventions DME instruction;Gait training;Balance training;Functional mobility training;Therapeutic activities;Therapeutic exercise    PT Goals (Current goals can be found in the Care Plan section)  Acute Rehab PT Goals Patient Stated Goal: get stronger, return to walking. I was walking with PT at the rehab center and want to conitnue to get stronger to be safe. PT Goal Formulation: With patient Time For Goal Achievement: 01/18/19 Potential to Achieve Goals: Good    Frequency Min 2X/week   Barriers to discharge         Co-evaluation               AM-PAC PT "6 Clicks" Mobility  Outcome Measure Help needed turning from your back to your side while in a flat bed without using bedrails?: Total Help needed moving from lying on your back to sitting on the side of a flat bed without using bedrails?: Total Help needed moving to and from a bed to a chair (including a wheelchair)?: Total Help needed standing up from a chair using your arms (e.g., wheelchair or bedside chair)?: Total Help needed to walk in hospital room?: Total Help needed climbing 3-5 steps with a railing? : Total 6 Click Score: 6    End of Session Equipment Utilized During Treatment: Gait belt Activity Tolerance: Patient tolerated treatment well Patient left: in bed;with bed alarm set;with call bell/phone within reach Nurse Communication: Mobility status;Need for lift equipment(pt will need a lift for now  for OOB with nursing until back on her feet with rehab) PT Visit Diagnosis: Muscle weakness (generalized) (M62.81);Repeated falls (R29.6)    Time: 1030-1103 PT Time Calculation (min) (ACUTE ONLY): 33 min   Charges:   PT Evaluation $PT Eval Low Complexity: 1 Low PT Treatments $Therapeutic Activity: 8-22 mins        Clide Dales, PT Acute Rehabilitation Services Pager: [phone] Office: [phone] 01/04/2019   Clide Dales 01/04/2019, 1:38 PM

## 2019-01-04 NOTE — Progress Notes (Signed)
CSW called RN/Tech at ED TCU to inform them that pt's son Dr. [name] is able to speak to the pt by phone due to the pt previously providing verbal permission for the pt's son to do so.  CSW will continue to follow for D/C needs.  Alphonse Guild. Ruthel Martine, LCSW, LCAS, CSI Transitions of Care Clinical Social Worker Care Coordination Department Ph: [phone]

## 2019-01-05 NOTE — ED Notes (Signed)
Pt assisted to bedside commode and a bed bath, changed, repositioned, new Purewick. Pillows used to prop bony prominences.

## 2019-01-05 NOTE — Progress Notes (Addendum)
Received [name] awake in her room watching TV. She was compliant with her medications. Later she was assisted to the bathroom and had a BM. She was compliant with her medications. She did not sleep the entire night.

## 2019-01-05 NOTE — ED Notes (Addendum)
PT ALERT AND ORIENTED. PT DENIES ANY PAIN AT THIS TIME. PT RESTING IN BED QUIETLY. PT PUREWICK INTACT, PATENT, AND HOOKED UP TO SUCTION. PT EXPLAINED SHES ABLE TO TURN HERSELF WITHIN THE BED. PT TOLERATED MEDS WELL. PT SAFE WILL CONTINUE TO MONITOR.

## 2019-01-05 NOTE — ED Notes (Signed)
Pt given supplies and set up for washing hands, face and brushing teeth.

## 2019-01-05 NOTE — Progress Notes (Addendum)
9:42a CSW spoke with Gerald Stabs at the [facility] who reports their [place] facility does have beds available. They will start insurance auth with [facility].   CSW spoke with Claiborne Billings at [facility] who reports they had to admit someone into their SNF from the independent living program, so they will not be able to take patient. CSW reached out to patient's son and informed him of this and that he will have to choose another place out of her current bed offers in the hub. Patient's son chose [facility] in [place]. CSW awaiting to find out if they have a bed available today.   Golden Circle, LCSW Transitions of Care Department [facility] ED [phone]

## 2019-01-05 NOTE — Progress Notes (Signed)
CSW received a call from pt's son Dr. [name] at ph:  and provided pt's son with the # for Gerald Stabs in admissions at [facility].  Pt's son voiced understanding that pt will remain in the ED overnight as [facility] is awaiting the [facility] insurance authorization and the [facility] office is closed until Monday.  Pt's son ventilated frustration and CSW provided active listening and validation.  Pt's son was appreciative and thanked the CSW. 2nd shift ED CSW.  CSW will continue to follow for D/C needs.  Alphonse Guild. Kaelene Elliston, LCSW, LCAS, CSI Transitions of Care Clinical Social Worker Care Coordination Department Ph: [phone]

## 2019-01-06 NOTE — ED Notes (Signed)
Pt calm, cooperative, needs help with ADLS. Pt uses bedside commode. Pt can feed self. Pt can turn with assist.

## 2019-01-06 NOTE — Progress Notes (Signed)
Received [name] this PM at shift change in her bed watching TV. She was compliant with her medications. She slept from 0200 until 0630 hrs.

## 2019-01-06 NOTE — ED Notes (Signed)
Pt calm, cooperative, resting comfortably in bed.

## 2019-01-07 NOTE — Progress Notes (Signed)
Received [name] at the change of shift awake in her bed watching the TV. Her son called and she spoke with him on the phone. She was medicated per order and slept throughtout the night.

## 2019-01-07 NOTE — Progress Notes (Signed)
Supervisor spoke with Lindajo Royal at [facility].  She did receive insurance approval for SNF for this pt.  She notified insurance that she could not take the bed after she notified [place].   Winferd Humphrey, MSW, LCSW Advanced Care Supervisor 01/07/2019 2:46 PM

## 2019-01-07 NOTE — ED Provider Notes (Signed)
Vitals:   01/06/19 2115 01/07/19 0659  BP: 111/72 (!) 112/57  Pulse: 87 85  Resp: 20 20  Temp: 98.2 F (36.8 C) 98.8 F (37.1 C)  SpO2: 92% 100%   Patient is medically stable.  Awaiting nursing home placement.   Dorie Rank, MD 01/07/19 [phone]

## 2019-01-08 LAB — SARS CORONAVIRUS 2 BY RT PCR (HOSPITAL ORDER, PERFORMED IN ~~LOC~~ HOSPITAL LAB): SARS Coronavirus 2: NEGATIVE

## 2019-01-08 NOTE — ED Provider Notes (Signed)
Notified that pt is ready for transport to the [place] center.  Stable for transfer.  Vitals:   01/07/19 2130 01/08/19 1332  BP: 130/63 (!) 110/54  Pulse: 89 84  Resp: 20 19  Temp: 98.2 F (36.8 C) 98.7 F (37.1 C)  SpO2: 95% 92%      Dorie Rank, MD 01/08/19 1617

## 2019-01-08 NOTE — ED Notes (Signed)
[facility] called for transport.  

## 2019-01-08 NOTE — Progress Notes (Addendum)
1:20p CSW spoke with Clide Dales to ask if they could expidite seeing patient to get her placed. Gatha Mayer states she will see patient ASAP and follow up with CSW.  11:24a CSW received called from [place] and he stated that [facility] is requesting updated documentation on patient to complete the authorization. CSW made EDP aware that patient will need a new PT consult to have an updated PT eval. CSW also attempted to call [employer] to speak with someone about patient, but their system is down at this time.   8:36a CSW contacted Gerald Stabs with [facility] for an update on insurance authorization. CSW awaiting call back from [place] on the status.   Golden Circle, LCSW Transitions of Care Department [facility] ED [phone]

## 2019-01-08 NOTE — ED Notes (Signed)
Pt assisted to [facility] and bath given.

## 2019-01-08 NOTE — ED Notes (Signed)
Transported to [facility] by [place]. All belongings given to [facility]. Pt was calm and cooperative.

## 2019-01-08 NOTE — Progress Notes (Signed)
CSW received a call from pt's family friend Fernande Bras at ph: [phone] who stated she has left work, driven towards the pt's home to retrieve some belongings and and should be arriving to the ED at approx 6pm with the belongings the pt will need at her SNF..  RN updated.  CSW will continue to follow for D/C needs.  Alphonse Guild. Enza Shone, LCSW, LCAS, CSI Transitions of Care Clinical Social Worker Care Coordination Department Ph: [phone]

## 2019-01-08 NOTE — Progress Notes (Signed)
CSW received a call from [place] at  stating the patient has been offered a bed and has been accepted and that the pt can arrive on 01/08/2019.  The pt's accepting doctor is the SNF MD.  The room number is TBD on [facility].  The number for report is [phone].  Please fax the pt's negative COVID test and completed AVS to Fax: [phone] ASAP so the pt's medications can be ordered a the facility prior to pt arriving..  CSW will update RN and EDP.  Alphonse Guild. Davante Gerke, Latanya Presser, LCAS Clinical Social Worker Ph: [phone]

## 2019-01-08 NOTE — Progress Notes (Addendum)
CSW received a call from pt's son stating that the pt's family friend Fernande Bras at ph: [phone] was to bring the pt's belongings to her in the ED prior to the pt's discharge.  Per pt's son the friend can be called to ask that they bring the pt's belongings if they have not yet arrived.  RN updated.  Per RN, no new clothing (besides that which the pt brought with her) was present.  CSW called the above pt's family friend Fernande Bras at ph: [phone] and left a VM to seek clarification.  CSW will continue to follow for D/C needs.  Alphonse Guild. Tenicia Gural, LCSW, LCAS, CSI Transitions of Care Clinical Social Worker Care Coordination Department Ph: [phone]

## 2019-01-08 NOTE — Progress Notes (Signed)
CSW received call from [facility] with [facility] reporting that they received auth for patient. Gerald Stabs reports number for reports is [phone] and patient will be going to the [address] (exact room number TBD).   CSW made EDP aware and to have a new COVID test ordered. Patient can be transported via [facility].   Golden Circle, LCSW Transitions of Care Department [facility] ED [phone]

## 2019-01-08 NOTE — ED Notes (Signed)
Report called to [facility] where pt will be going for rehab.

## 2019-01-10 ENCOUNTER — Ambulatory Visit: Payer: Medicare Other | Admitting: Oncology

## 2019-01-10 ENCOUNTER — Other Ambulatory Visit: Payer: Medicare Other

## 2019-02-10 ENCOUNTER — Other Ambulatory Visit: Payer: Self-pay | Admitting: Oncology

## 2019-05-09 ENCOUNTER — Other Ambulatory Visit: Payer: Self-pay

## 2019-05-09 ENCOUNTER — Emergency Department (HOSPITAL_COMMUNITY): Payer: Medicare Other

## 2019-05-09 ENCOUNTER — Inpatient Hospital Stay (HOSPITAL_COMMUNITY)
Admission: EM | Admit: 2019-05-09 | Discharge: 2019-05-15 | DRG: 690 | Disposition: A | Discharge status: Skilled Nursing Facility | Payer: Medicare Other | Attending: Internal Medicine | Admitting: Internal Medicine

## 2019-05-09 DIAGNOSIS — Z923 Personal history of irradiation: Secondary | ICD-10-CM

## 2019-05-09 DIAGNOSIS — K5641 Fecal impaction: Secondary | ICD-10-CM | POA: Diagnosis not present

## 2019-05-09 DIAGNOSIS — E785 Hyperlipidemia, unspecified: Secondary | ICD-10-CM | POA: Diagnosis present

## 2019-05-09 DIAGNOSIS — B9620 Unspecified Escherichia coli [E. coli] as the cause of diseases classified elsewhere: Secondary | ICD-10-CM | POA: Diagnosis present

## 2019-05-09 DIAGNOSIS — N183 Chronic kidney disease, stage 3 unspecified: Secondary | ICD-10-CM

## 2019-05-09 DIAGNOSIS — M549 Dorsalgia, unspecified: Secondary | ICD-10-CM | POA: Diagnosis present

## 2019-05-09 DIAGNOSIS — F419 Anxiety disorder, unspecified: Secondary | ICD-10-CM | POA: Diagnosis present

## 2019-05-09 DIAGNOSIS — N3000 Acute cystitis without hematuria: Principal | ICD-10-CM | POA: Diagnosis present

## 2019-05-09 DIAGNOSIS — I129 Hypertensive chronic kidney disease with stage 1 through stage 4 chronic kidney disease, or unspecified chronic kidney disease: Secondary | ICD-10-CM | POA: Diagnosis present

## 2019-05-09 DIAGNOSIS — Z803 Family history of malignant neoplasm of breast: Secondary | ICD-10-CM

## 2019-05-09 DIAGNOSIS — R531 Weakness: Secondary | ICD-10-CM

## 2019-05-09 DIAGNOSIS — G8929 Other chronic pain: Secondary | ICD-10-CM | POA: Diagnosis present

## 2019-05-09 DIAGNOSIS — Z6841 Body Mass Index (BMI) 40.0 and over, adult: Secondary | ICD-10-CM

## 2019-05-09 DIAGNOSIS — F329 Major depressive disorder, single episode, unspecified: Secondary | ICD-10-CM | POA: Diagnosis present

## 2019-05-09 DIAGNOSIS — Z7901 Long term (current) use of anticoagulants: Secondary | ICD-10-CM

## 2019-05-09 DIAGNOSIS — Z20828 Contact with and (suspected) exposure to other viral communicable diseases: Secondary | ICD-10-CM | POA: Diagnosis present

## 2019-05-09 DIAGNOSIS — Z7984 Long term (current) use of oral hypoglycemic drugs: Secondary | ICD-10-CM

## 2019-05-09 DIAGNOSIS — M160 Bilateral primary osteoarthritis of hip: Secondary | ICD-10-CM | POA: Diagnosis present

## 2019-05-09 DIAGNOSIS — M810 Age-related osteoporosis without current pathological fracture: Secondary | ICD-10-CM | POA: Diagnosis present

## 2019-05-09 DIAGNOSIS — K5900 Constipation, unspecified: Secondary | ICD-10-CM

## 2019-05-09 DIAGNOSIS — Z86711 Personal history of pulmonary embolism: Secondary | ICD-10-CM

## 2019-05-09 DIAGNOSIS — N1832 Chronic kidney disease, stage 3b: Secondary | ICD-10-CM | POA: Diagnosis present

## 2019-05-09 DIAGNOSIS — N390 Urinary tract infection, site not specified: Secondary | ICD-10-CM | POA: Diagnosis present

## 2019-05-09 DIAGNOSIS — I2699 Other pulmonary embolism without acute cor pulmonale: Secondary | ICD-10-CM | POA: Diagnosis present

## 2019-05-09 DIAGNOSIS — Z8542 Personal history of malignant neoplasm of other parts of uterus: Secondary | ICD-10-CM

## 2019-05-09 DIAGNOSIS — Z90710 Acquired absence of both cervix and uterus: Secondary | ICD-10-CM

## 2019-05-09 DIAGNOSIS — R000 Tachycardia, unspecified: Secondary | ICD-10-CM | POA: Diagnosis present

## 2019-05-09 LAB — URINALYSIS, ROUTINE W REFLEX MICROSCOPIC
Bilirubin Urine: NEGATIVE
Glucose, UA: NEGATIVE mg/dL
Ketones, ur: NEGATIVE mg/dL
Nitrite: NEGATIVE
Protein, ur: 30 mg/dL — AB
Specific Gravity, Urine: 1.01 (ref 1.005–1.030)
WBC, UA: >50 WBC/hpf — ABNORMAL HIGH (ref 0–5)
pH: 6 (ref 5.0–8.0)

## 2019-05-09 LAB — COMPREHENSIVE METABOLIC PANEL
ALT: 15 U/L (ref 0–44)
AST: 17 U/L (ref 15–41)
Albumin: 3.7 g/dL (ref 3.5–5.0)
Alkaline Phosphatase: 88 U/L (ref 38–126)
Anion gap: 12 (ref 5–15)
BUN: 19 mg/dL (ref 8–23)
CO2: 22 mmol/L (ref 22–32)
Calcium: 10 mg/dL (ref 8.9–10.3)
Chloride: 100 mmol/L (ref 98–111)
Creatinine, Ser: 1.13 mg/dL — ABNORMAL HIGH (ref 0.44–1.00)
GFR calc Af Amer: 55 mL/min — ABNORMAL LOW (ref >60)
GFR calc non Af Amer: 47 mL/min — ABNORMAL LOW (ref >60)
Glucose, Bld: 114 mg/dL — ABNORMAL HIGH (ref 70–99)
Potassium: 4.5 mmol/L (ref 3.5–5.1)
Sodium: 134 mmol/L — ABNORMAL LOW (ref 135–145)
Total Bilirubin: 0.6 mg/dL (ref 0.3–1.2)
Total Protein: 7.7 g/dL (ref 6.5–8.1)

## 2019-05-09 LAB — CBC WITH DIFFERENTIAL/PLATELET
Abs Immature Granulocytes: 0.32 10*3/uL — ABNORMAL HIGH (ref 0.00–0.07)
Basophils Absolute: 0.1 10*3/uL (ref 0.0–0.1)
Basophils Relative: 1 %
Eosinophils Absolute: 0.1 10*3/uL (ref 0.0–0.5)
Eosinophils Relative: 2 %
HCT: 49.6 % — ABNORMAL HIGH (ref 36.0–46.0)
Hemoglobin: 14.9 g/dL (ref 12.0–15.0)
Immature Granulocytes: 4 %
Lymphocytes Relative: 18 %
Lymphs Abs: 1.5 10*3/uL (ref 0.7–4.0)
MCH: 27.2 pg (ref 26.0–34.0)
MCHC: 30 g/dL (ref 30.0–36.0)
MCV: 90.5 fL (ref 80.0–100.0)
Monocytes Absolute: 0.5 10*3/uL (ref 0.1–1.0)
Monocytes Relative: 6 %
Neutro Abs: 5.8 10*3/uL (ref 1.7–7.7)
Neutrophils Relative %: 69 %
Platelets: 319 10*3/uL (ref 150–400)
RBC: 5.48 MIL/uL — ABNORMAL HIGH (ref 3.87–5.11)
RDW: 14.8 % (ref 11.5–15.5)
WBC: 8.4 10*3/uL (ref 4.0–10.5)
nRBC: 0 % (ref 0.0–0.2)

## 2019-05-09 LAB — LIPASE, BLOOD: Lipase: 34 U/L (ref 11–51)

## 2019-05-09 MED ORDER — FLEET ENEMA 7-19 GM/118ML RE ENEM
1.0000 | ENEMA | Freq: Once | RECTAL | Status: AC
  Administered 2019-05-10: 1 via RECTAL
  Filled 2019-05-09: qty 1

## 2019-05-09 MED ORDER — SODIUM CHLORIDE 0.9 % IV SOLN
1.0000 g | Freq: Once | INTRAVENOUS | Status: AC
  Administered 2019-05-10: 1 g via INTRAVENOUS
  Filled 2019-05-09: qty 10

## 2019-05-09 MED ORDER — SODIUM CHLORIDE 0.9 % IV SOLN: Freq: Once | INTRAVENOUS | Status: AC

## 2019-05-09 NOTE — ED Triage Notes (Signed)
Pt BIB [facility] from home. Pt c/o constipation x3 weeks. Pt has tried miralax one time on Monday and prune juice for a week. Pt abdomen is distended and lower bilateral abdomen is painful upon palpation. Pt has limited mobility.

## 2019-05-09 NOTE — ED Provider Notes (Signed)
[facility] DEPT Provider Note   CSN: [id number] Arrival date & time: 05/09/19  1809     History Chief Complaint  Patient presents with  . Constipation    [name] is a 78 y.o. female.  HPI   Patient presents emergency room for evaluation of constipation and inability to urinate.  Patient states she has difficulty with ambulation.  She uses a walker and was given a wheelchair but has not been able to use it because does not fit in the hallways of her home.  She has normally been able to use her walker but in the last couple of days she had increasing weakness.  Patient states she has not been able to have a bowel movement 3 weeks.  Patient has not taken any laxative medications but has been trying to drink apple juice and prune juice.  Patient denies any fevers or chills.  She has had some nausea.  She denies any diarrhea.  Past Medical History:  Diagnosis Date  . Allergy   . Arthritis    "joints; mostly on the right side" (01/11/2013)  . Breast cancer ([place])    "left" (01/11/2013)  . Cancer [facility])    endometrial  . Chronic lower back pain   . Cold    just started 01/02/13 cough  . Complication of anesthesia    very phobic about needles.last surgery had to sedate before doing iv  . Depression   . Exertional shortness of breath   . Hyperlipidemia   . Hypertension   . Osteoporosis    knees   . Personal history of radiation therapy 03/2013   left  . Status post radiation therapy within last four weeks 03/19/13-05/03/13   lt breast 60.4GY  . Uterine cancer [facility])     Patient Active Problem List   Diagnosis Date Noted  . Pulmonary embolus ([place])   . Pulmonary embolus, left ([place])   . Pulmonary embolus, right ([place])   . CKD (chronic kidney disease), stage I   . Bilateral pulmonary embolism ([place])   . PE (pulmonary embolism)   . Acute kidney injury superimposed on chronic kidney disease ([place]) 11/30/2015  . Depression 11/30/2015  .  Essential hypertension 11/30/2015  . Acute respiratory failure with hypoxia ([place]) 11/30/2015  . Diabetes mellitus type 2 in obese ([place]) 11/30/2015  . Pulmonary embolism ([place]) 11/29/2015  . Pulmonary embolism, bilateral ([place]) 11/29/2015  . Pain in lower limb 01/10/2015  . Malignant neoplasm of upper-outer quadrant of left breast in female, estrogen receptor positive ([place]) 01/24/2013  . Edema 01/09/2013  . Onychomycosis 01/09/2013  . Pain in joint, ankle and foot 01/09/2013    Past Surgical History:  Procedure Laterality Date  . ABDOMINAL HYSTERECTOMY  12/14/2012  . BREAST BIOPSY Left 12/2012  . BREAST LUMPECTOMY Left 2014  . BREAST LUMPECTOMY WITH NEEDLE LOCALIZATION AND AXILLARY SENTINEL LYMPH NODE BX Left 01/11/2013  . BREAST LUMPECTOMY WITH NEEDLE LOCALIZATION AND AXILLARY SENTINEL LYMPH NODE BX Left 01/11/2013   Procedure: BREAST LUMPECTOMY WITH NEEDLE LOCALIZATION AND AXILLARY SENTINEL LYMPH NODE BX;  Surgeon: Rolm Bookbinder, MD;  Location: [facility];  Service: General;  Laterality: Left;  . CHOLECYSTECTOMY  1970  . DILATION AND CURETTAGE OF UTERUS     "@ least one" (01/11/2013)  . FOOT TENDON SURGERY Right   . KNEE ARTHROSCOPY Right 1990's   "twice" (01/11/2013)  . REPLACEMENT TOTAL KNEE Bilateral 2011-2012  . TUBAL LIGATION       OB History  No obstetric history on file.     Family History  Problem Relation Age of Onset  . Breast cancer Mother 65  . Esophageal cancer Neg Hx   . Colon cancer Neg Hx   . Stomach cancer Neg Hx   . Rectal cancer Neg Hx     Social History   Tobacco Use  . Smoking status: Never Smoker  . Smokeless tobacco: Never Used  Substance Use Topics  . Alcohol use: No    Alcohol/week: 0.0 standard drinks  . Drug use: No    Home Medications Prior to Admission medications   Medication Sig Start Date End Date Taking? Authorizing Provider  acetaminophen (TYLENOL) 650 MG CR tablet Take 1,300 mg by mouth daily as needed for pain.    [provider]  AMBULATORY NON FORMULARY MEDICATION Take 1 mL by mouth daily. Ritualx Hemp Extract    [provider]  apixaban (ELIQUIS) 5 MG TABS tablet Take 5 mg by mouth 2 (two) times daily.    [provider]  B-COMPLEX-C PO Take 1 tablet by mouth daily.    [provider]  buPROPion (WELLBUTRIN SR) 150 MG 12 hr tablet Take 150 mg by mouth 2 (two) times daily. 11/11/15   [provider]  carisoprodol (SOMA) 350 MG tablet Take 350 mg by mouth 2 (two) times daily.  12/15/12   [provider]  DULoxetine (CYMBALTA) 20 MG capsule Take 1 capsule (20 mg total) by mouth daily. 08/17/13   Gardenia Phlegm, NP  exemestane (AROMASIN) 25 MG tablet Take 1 tablet by mouth once daily 02/12/19   Magrinat, Virgie Dad, MD  fenofibrate 160 MG tablet Take 160 mg by mouth daily. 10/02/15   [provider]  furosemide (LASIX) 20 MG tablet Take 40 mg by mouth daily as needed for fluid.    [provider]  gabapentin (NEURONTIN) 300 MG capsule TAKE 1 CAPSULE BY MOUTH AT BEDTIME Patient taking differently: Take 300 mg by mouth 2 (two) times daily.  02/15/18   Magrinat, Virgie Dad, MD  Menthol, Topical Analgesic, (BIOFREEZE ROLL-ON) 4 % GEL Apply 1 application topically daily as needed (heel pain).    [provider]  Menthol, Topical Analgesic, (ICY HOT) 7.5 % (Roll) MISC Apply 1 each topically daily as needed (pain).    [provider]  metFORMIN (GLUCOPHAGE) 500 MG tablet Take 1 tablet (500 mg total) by mouth 2 (two) times daily with a meal. 12/09/15   [name], MD  metoprolol succinate (TOPROL-XL) 50 MG 24 hr tablet Take 50 mg by mouth 2 (two) times daily.  12/19/12   [provider]  Nutritional Supplements (QUINOA KALE & HEMP PO) Take 1 tablet by mouth daily.     [provider]  Propylene Glycol (SYSTANE BALANCE OP) Apply 1 drop to eye daily as needed (dry eyes).    [provider]  spironolactone  (ALDACTONE) 50 MG tablet Take 50 mg by mouth every morning. 04/24/17   [provider]    Allergies    Iodine  Review of Systems   Review of Systems  All other systems reviewed and are negative.   Physical Exam Updated Vital Signs BP 134/77 (BP Location: Left Arm)   Pulse (!) 102   Temp 98.4 F (36.9 C) (Oral)   Resp 18   SpO2 96%   Physical Exam Vitals and nursing note reviewed.  Constitutional:      General: She is not in acute distress.  Appearance: She is well-developed.  HENT:     Head: Normocephalic and atraumatic.     Right Ear: External ear normal.     Left Ear: External ear normal.  Eyes:     General: No scleral icterus.       Right eye: No discharge.        Left eye: No discharge.     Conjunctiva/sclera: Conjunctivae normal.  Neck:     Trachea: No tracheal deviation.  Cardiovascular:     Rate and Rhythm: Normal rate and regular rhythm.  Pulmonary:     Effort: Pulmonary effort is normal. No respiratory distress.     Breath sounds: Normal breath sounds. No stridor. No wheezing or rales.  Abdominal:     General: Bowel sounds are normal. There is no distension.     Palpations: Abdomen is soft.     Tenderness: There is no abdominal tenderness. There is no guarding or rebound.     Comments: No focal tenderness, protuberant abdomen  Genitourinary:    Comments: Rectal exam deferred because the patient is in a hallway bed Musculoskeletal:        General: No tenderness.     Cervical back: Neck supple.     Right lower leg: Edema present.     Left lower leg: Edema present.  Skin:    General: Skin is warm and dry.     Findings: No rash.  Neurological:     Mental Status: She is alert.     Cranial Nerves: No cranial nerve deficit (no facial droop, extraocular movements intact, no slurred speech).     Sensory: No sensory deficit.     Motor: Weakness present. No abnormal muscle tone or seizure activity.     Coordination: Coordination normal.      Comments: Patient able to wiggle her toes but unable to lift her legs off the bed, equal grip strength bilaterally, no facial droop     ED Results / Procedures / Treatments   Labs (all labs ordered are listed, but only abnormal results are displayed) Labs Reviewed  CBC WITH DIFFERENTIAL/PLATELET - Abnormal; Notable for the following components:      Result Value   RBC 5.48 (*)    HCT 49.6 (*)    Abs Immature Granulocytes 0.32 (*)    All other components within normal limits  COMPREHENSIVE METABOLIC PANEL - Abnormal; Notable for the following components:   Sodium 134 (*)    Glucose, Bld 114 (*)    Creatinine, Ser 1.13 (*)    GFR calc non Af Amer 47 (*)    GFR calc Af Amer 55 (*)    All other components within normal limits  URINALYSIS, ROUTINE W REFLEX MICROSCOPIC - Abnormal; Notable for the following components:   APPearance HAZY (*)    Hgb urine dipstick SMALL (*)    Protein, ur 30 (*)    Leukocytes,Ua LARGE (*)    WBC, UA >50 (*)    Bacteria, UA MANY (*)    All other components within normal limits  URINE CULTURE  SARS CORONAVIRUS 2 (TAT 6-24 HRS)  LIPASE, BLOOD    EKG EKG Interpretation  Date/Time:  Wednesday May 09 2019 20:43:35 EST Ventricular Rate:  108 PR Interval:    QRS Duration: 78 QT Interval:  315 QTC Calculation: 423 R Axis:   -26 Text Interpretation: Sinus tachycardia Inferior infarct, old Baseline wander in lead(s) I II III aVR aVF Since last tracing rate slower Confirmed by Dorie Rank (  [id number]) on 05/09/2019 9:39:21 PM   Radiology DG Lumbar Spine Complete  Result Date: 05/09/2019 CLINICAL DATA:  Fall with weakness. EXAM: LUMBAR SPINE - COMPLETE 4+ VIEW COMPARISON:  Lumbar MRI dated May 06, 2016. FINDINGS: There is no displaced fracture involving the lumbar spine. There is questionable height loss of the T11 vertebral body. There is age-indeterminate height loss of the superior endplate of the L4 vertebral body. Aortic calcifications are  noted. Multilevel disc height loss is noted throughout the lumbar spine, greatest at the L2-L3 level. IMPRESSION: 1. Age-indeterminate height loss of the L4 vertebral body. Correlation with point tenderness is recommended. This may represent an acute compression fracture in the appropriate clinical setting. 2. Questionable height loss of the T11 vertebral body. 3. Multilevel degenerative changes throughout the lumbar spine. 4. Aortic calcifications are noted. 5. Large stool burden at the level of the rectum. Electronically Signed   By: Constance Holster M.D.   On: 05/09/2019 20:04   DG Abdomen Acute W/Chest  Result Date: 05/09/2019 CLINICAL DATA:  Fall with weakness. Constipation. EXAM: DG ABDOMEN ACUTE W/ 1V CHEST COMPARISON:  None. FINDINGS: There is a large stool burden at the level of the rectum, concerning for fecal impaction. There is a moderate amount of stool in the descending colon. There is no radiographic evidence for small bowel obstruction. Degenerative changes are noted throughout the thoracic spine. Aortic calcifications are noted. There are advanced degenerative changes of both femoroacetabular joints. There is elevation of the right hemidiaphragm. There is atelectasis at the lung bases. There is no pneumothorax. No large pleural effusion. Advanced degenerative changes are noted of the bilateral glenohumeral joints. The heart size is relatively normal. IMPRESSION: 1. Large stool burden at the level of the rectum, concerning for fecal impaction. 2. Moderate amount of stool in the descending colon. 3. Bibasilar atelectasis. 4. Advanced degenerative changes of both hips. 5. No acute cardiopulmonary process. Electronically Signed   By: Constance Holster M.D.   On: 05/09/2019 20:06    Procedures Procedures (including critical care time)  Medications Ordered in ED Medications  cefTRIAXone (ROCEPHIN) 1 g in sodium chloride 0.9 % 100 mL IVPB (has no administration in time range)  sodium  phosphate (FLEET) 7-19 GM/118ML enema 1 enema (has no administration in time range)    ED Course  I have reviewed the triage vital signs and the nursing notes.  Pertinent labs & imaging results that were available during my care of the patient were reviewed by me and considered in my medical decision making (see chart for details).  Clinical Course as of May 08 2306  Wed May 09, 2019  2137 Abdominal series is consistent with a fecal impaction and constipation.  Patient's urinalysis also is consistent with a urinary tract infection.  No signs of sepsis at this time.   [JK]    Clinical Course User Index [JK] Dorie Rank, MD   MDM Rules/Calculators/A&P                    Patient presented with increasing weakness, constipation and urinary discomfort.  Patient's ED work-up is notable for urinary tract infection.  X-ray is also consistent with fecal impaction and constipation.  Patient is having increasing weakness and her caregivers have not been able to assist her at home.  Think the patient would benefit from IV antibiotics as well as enemas to help her with her constipation and fecal impaction.  Patient is unable to care for self at home with her  chronic comorbidities.  I will consult the medical service for admission and further treatment.  Final Clinical Impression(s) / ED Diagnoses Final diagnoses:  Acute cystitis without hematuria  Fecal impaction ([place])  Constipation, unspecified constipation type  Weakness      Dorie Rank, MD 05/09/19 2309

## 2019-05-09 NOTE — ED Triage Notes (Signed)
Patient arrived via EMS from home and has not used bathroom in 3 weeks. EMS notes massive urinary retention and bladder distention. Unknown last meal, CNA and RN that comes to the house called out the last 2 nights. RN was supposed to bring patient's medication today but didn't because of a call out. Patient has had recent bilateral knee replacements and has been prescribed oxycodone for it but has not taken it today.   158/92 96 HR 18 R 94 RA 98.0 temp

## 2019-05-10 ENCOUNTER — Other Ambulatory Visit: Payer: Self-pay

## 2019-05-10 ENCOUNTER — Encounter (HOSPITAL_COMMUNITY): Payer: Self-pay | Admitting: Internal Medicine

## 2019-05-10 DIAGNOSIS — D5700 Hb-SS disease with crisis, unspecified: Secondary | ICD-10-CM | POA: Insufficient documentation

## 2019-05-10 DIAGNOSIS — N183 Chronic kidney disease, stage 3 unspecified: Secondary | ICD-10-CM

## 2019-05-10 DIAGNOSIS — R531 Weakness: Secondary | ICD-10-CM | POA: Diagnosis not present

## 2019-05-10 DIAGNOSIS — N3000 Acute cystitis without hematuria: Secondary | ICD-10-CM | POA: Diagnosis present

## 2019-05-10 LAB — COMPREHENSIVE METABOLIC PANEL
ALT: 17 U/L (ref 0–44)
AST: 21 U/L (ref 15–41)
Albumin: 3.7 g/dL (ref 3.5–5.0)
Alkaline Phosphatase: 94 U/L (ref 38–126)
Anion gap: 12 (ref 5–15)
BUN: 22 mg/dL (ref 8–23)
CO2: 21 mmol/L — ABNORMAL LOW (ref 22–32)
Calcium: 9.9 mg/dL (ref 8.9–10.3)
Chloride: 100 mmol/L (ref 98–111)
Creatinine, Ser: 1.36 mg/dL — ABNORMAL HIGH (ref 0.44–1.00)
GFR calc Af Amer: 44 mL/min — ABNORMAL LOW (ref >60)
GFR calc non Af Amer: 38 mL/min — ABNORMAL LOW (ref >60)
Glucose, Bld: 125 mg/dL — ABNORMAL HIGH (ref 70–99)
Potassium: 4.1 mmol/L (ref 3.5–5.1)
Sodium: 133 mmol/L — ABNORMAL LOW (ref 135–145)
Total Bilirubin: 0.8 mg/dL (ref 0.3–1.2)
Total Protein: 7.4 g/dL (ref 6.5–8.1)

## 2019-05-10 LAB — CBC
HCT: 47.7 % — ABNORMAL HIGH (ref 36.0–46.0)
Hemoglobin: 14.6 g/dL (ref 12.0–15.0)
MCH: 27.5 pg (ref 26.0–34.0)
MCHC: 30.6 g/dL (ref 30.0–36.0)
MCV: 89.8 fL (ref 80.0–100.0)
Platelets: 307 10*3/uL (ref 150–400)
RBC: 5.31 MIL/uL — ABNORMAL HIGH (ref 3.87–5.11)
RDW: 14.8 % (ref 11.5–15.5)
WBC: 12 10*3/uL — ABNORMAL HIGH (ref 4.0–10.5)
nRBC: 0 % (ref 0.0–0.2)

## 2019-05-10 LAB — SARS CORONAVIRUS 2 (TAT 6-24 HRS): SARS Coronavirus 2: NEGATIVE

## 2019-05-10 MED ORDER — ACETAMINOPHEN 650 MG RE SUPP: 650.0000 mg | Freq: Four times a day (QID) | RECTAL | Status: DC | PRN

## 2019-05-10 MED ORDER — SENNA 8.6 MG PO TABS
1.0000 | ORAL_TABLET | Freq: Every day | ORAL | Status: DC
  Administered 2019-05-11 – 2019-05-15 (×4): 8.6 mg via ORAL
  Filled 2019-05-10 (×4): qty 1

## 2019-05-10 MED ORDER — METOPROLOL SUCCINATE ER 50 MG PO TB24
50.0000 mg | ORAL_TABLET | Freq: Two times a day (BID) | ORAL | Status: DC
  Administered 2019-05-10 – 2019-05-15 (×10): 50 mg via ORAL
  Filled 2019-05-10 (×10): qty 1

## 2019-05-10 MED ORDER — BUPROPION HCL ER (SR) 150 MG PO TB12
150.0000 mg | ORAL_TABLET | Freq: Two times a day (BID) | ORAL | Status: DC
  Administered 2019-05-10 – 2019-05-15 (×10): 150 mg via ORAL
  Filled 2019-05-10 (×11): qty 1

## 2019-05-10 MED ORDER — EXEMESTANE 25 MG PO TABS
25.0000 mg | ORAL_TABLET | Freq: Every day | ORAL | Status: DC
  Administered 2019-05-10 – 2019-05-15 (×6): 25 mg via ORAL
  Filled 2019-05-10 (×7): qty 1

## 2019-05-10 MED ORDER — DULOXETINE HCL 20 MG PO CPEP
20.0000 mg | ORAL_CAPSULE | Freq: Every day | ORAL | Status: DC
  Administered 2019-05-10 – 2019-05-15 (×6): 20 mg via ORAL
  Filled 2019-05-10 (×7): qty 1

## 2019-05-10 MED ORDER — CHLORHEXIDINE GLUCONATE CLOTH 2 % EX PADS
6.0000 | MEDICATED_PAD | Freq: Every day | CUTANEOUS | Status: DC
  Administered 2019-05-12 – 2019-05-15 (×4): 6 via TOPICAL

## 2019-05-10 MED ORDER — POLYETHYLENE GLYCOL 3350 17 G PO PACK
17.0000 g | PACK | Freq: Every day | ORAL | Status: DC
  Administered 2019-05-11 – 2019-05-15 (×4): 17 g via ORAL
  Filled 2019-05-10 (×4): qty 1

## 2019-05-10 MED ORDER — ONDANSETRON HCL 4 MG/2ML IJ SOLN: 4.0000 mg | Freq: Four times a day (QID) | INTRAMUSCULAR | Status: DC | PRN

## 2019-05-10 MED ORDER — DOCUSATE SODIUM 100 MG PO CAPS
100.0000 mg | ORAL_CAPSULE | Freq: Every day | ORAL | Status: DC
  Administered 2019-05-11 – 2019-05-15 (×4): 100 mg via ORAL
  Filled 2019-05-10 (×4): qty 1

## 2019-05-10 MED ORDER — MAGNESIUM CITRATE PO SOLN
1.0000 | Freq: Once | ORAL | Status: AC
  Administered 2019-05-10: 1 via ORAL
  Filled 2019-05-10: qty 296

## 2019-05-10 MED ORDER — CARISOPRODOL 350 MG PO TABS
350.0000 mg | ORAL_TABLET | Freq: Two times a day (BID) | ORAL | Status: DC
  Administered 2019-05-10 – 2019-05-15 (×9): 350 mg via ORAL
  Filled 2019-05-10 (×8): qty 1

## 2019-05-10 MED ORDER — FENOFIBRATE 160 MG PO TABS
160.0000 mg | ORAL_TABLET | Freq: Every day | ORAL | Status: DC
  Administered 2019-05-10 – 2019-05-15 (×6): 160 mg via ORAL
  Filled 2019-05-10 (×6): qty 1

## 2019-05-10 MED ORDER — APIXABAN 5 MG PO TABS
5.0000 mg | ORAL_TABLET | Freq: Two times a day (BID) | ORAL | Status: DC
  Administered 2019-05-10 – 2019-05-15 (×12): 5 mg via ORAL
  Filled 2019-05-10 (×13): qty 1

## 2019-05-10 MED ORDER — MAGNESIUM CITRATE PO SOLN: 1.0000 | Freq: Once | ORAL | Status: DC | PRN

## 2019-05-10 MED ORDER — GABAPENTIN 300 MG PO CAPS
300.0000 mg | ORAL_CAPSULE | Freq: Every day | ORAL | Status: DC
  Administered 2019-05-10 – 2019-05-14 (×5): 300 mg via ORAL
  Filled 2019-05-10 (×5): qty 1

## 2019-05-10 MED ORDER — ACETAMINOPHEN 325 MG PO TABS
650.0000 mg | ORAL_TABLET | Freq: Four times a day (QID) | ORAL | Status: DC | PRN
  Administered 2019-05-12 – 2019-05-15 (×3): 650 mg via ORAL
  Filled 2019-05-10 (×3): qty 2

## 2019-05-10 MED ORDER — SPIRONOLACTONE 25 MG PO TABS
50.0000 mg | ORAL_TABLET | ORAL | Status: DC
  Administered 2019-05-11 – 2019-05-15 (×5): 50 mg via ORAL
  Filled 2019-05-10 (×8): qty 2

## 2019-05-10 MED ORDER — ONDANSETRON HCL 4 MG PO TABS: 4.0000 mg | ORAL_TABLET | Freq: Four times a day (QID) | ORAL | Status: DC | PRN

## 2019-05-10 NOTE — ED Notes (Signed)
ED TO INPATIENT HANDOFF REPORT  ED Nurse Name and Phone #: Gara Kroner, RN  S Name/Age/Gender [name] 78 y.o. female Room/Bed: WA09/WA09  Code Status   Code Status: Full Code  Home/SNF/Other Home Patient oriented to: self, place, time and situation Is this baseline? Yes   Triage Complete: Triage complete  Chief Complaint Weakness generalized [R53.1]  Triage Note Patient arrived via EMS from home and has not used bathroom in 3 weeks. EMS notes massive urinary retention and bladder distention. Unknown last meal, CNA and RN that comes to the house called out the last 2 nights. RN was supposed to bring patient's medication today but didn't because of a call out. Patient has had recent bilateral knee replacements and has been prescribed oxycodone for it but has not taken it today.   158/92 96 HR 18 R 94 RA 98.0 temp   Pt BIB [facility] from home. Pt c/o constipation x3 weeks. Pt has tried miralax one time on Monday and prune juice for a week. Pt abdomen is distended and lower bilateral abdomen is painful upon palpation. Pt has limited mobility.    Allergies Allergies  Allergen Reactions  . Iodine Rash    Level of Care/Admitting Diagnosis ED Disposition    ED Disposition Condition Comment   Admit  Hospital Area: [facility] [id number]  Level of Care: Med-Surg [16]  Covid Evaluation: Asymptomatic Screening Protocol (No Symptoms)  Diagnosis: Weakness generalized [id number]  Admitting Physician: Edmonia Lynch [id number]  Attending Physician: Edmonia Lynch [id number]       B Medical/Surgery History Past Medical History:  Diagnosis Date  . Allergy   . Arthritis    "joints; mostly on the right side" (01/11/2013)  . Breast cancer ([place])    "left" (01/11/2013)  . Cancer [facility])    endometrial  . Chronic lower back pain   . Cold    just started 01/02/13 cough  . Complication of anesthesia    very phobic about needles.last surgery had to sedate  before doing iv  . Depression   . Exertional shortness of breath   . Hyperlipidemia   . Hypertension   . Osteoporosis    knees   . Personal history of radiation therapy 03/2013   left  . Status post radiation therapy within last four weeks 03/19/13-05/03/13   lt breast 60.4GY  . Uterine cancer [facility])    Past Surgical History:  Procedure Laterality Date  . ABDOMINAL HYSTERECTOMY  12/14/2012  . BREAST BIOPSY Left 12/2012  . BREAST LUMPECTOMY Left 2014  . BREAST LUMPECTOMY WITH NEEDLE LOCALIZATION AND AXILLARY SENTINEL LYMPH NODE BX Left 01/11/2013  . BREAST LUMPECTOMY WITH NEEDLE LOCALIZATION AND AXILLARY SENTINEL LYMPH NODE BX Left 01/11/2013   Procedure: BREAST LUMPECTOMY WITH NEEDLE LOCALIZATION AND AXILLARY SENTINEL LYMPH NODE BX;  Surgeon: Rolm Bookbinder, MD;  Location: [facility];  Service: General;  Laterality: Left;  . CHOLECYSTECTOMY  1970  . DILATION AND CURETTAGE OF UTERUS     "@ least one" (01/11/2013)  . FOOT TENDON SURGERY Right   . KNEE ARTHROSCOPY Right 1990's   "twice" (01/11/2013)  . REPLACEMENT TOTAL KNEE Bilateral 2011-2012  . TUBAL LIGATION       A IV Location/Drains/Wounds Patient Lines/Drains/Airways Status   Active Line/Drains/Airways    Name:   Placement date:   Placement time:   Site:   Days:   Peripheral IV 05/10/19 Right Antecubital   05/10/19    0103    Antecubital   less  than 1   Urethral Catheter [name] Malta, RN 16 Fr.   05/09/19    2105    --   1   Incision 01/11/13 Breast Left   01/11/13    1119     2310   Incision 01/11/13 Axilla Left   01/11/13    --     2310          Intake/Output Last 24 hours No intake or output data in the 24 hours ending 05/10/19 0537  Labs/Imaging Results for orders placed or performed during the hospital encounter of 05/09/19 (from the past 48 hour(s))  CBC with Differential     Status: Abnormal   Collection Time: 05/09/19  7:18 PM  Result Value Ref Range   WBC 8.4 4.0 - 10.5 K/uL   RBC 5.48 (H) 3.87 - 5.11 MIL/uL    Hemoglobin 14.9 12.0 - 15.0 g/dL   HCT 49.6 (H) 36.0 - 46.0 %   MCV 90.5 80.0 - 100.0 fL   MCH 27.2 26.0 - 34.0 pg   MCHC 30.0 30.0 - 36.0 g/dL   RDW 14.8 11.5 - 15.5 %   Platelets 319 150 - 400 K/uL   nRBC 0.0 0.0 - 0.2 %   Neutrophils Relative % 69 %   Neutro Abs 5.8 1.7 - 7.7 K/uL   Lymphocytes Relative 18 %   Lymphs Abs 1.5 0.7 - 4.0 K/uL   Monocytes Relative 6 %   Monocytes Absolute 0.5 0.1 - 1.0 K/uL   Eosinophils Relative 2 %   Eosinophils Absolute 0.1 0.0 - 0.5 K/uL   Basophils Relative 1 %   Basophils Absolute 0.1 0.0 - 0.1 K/uL   Immature Granulocytes 4 %   Abs Immature Granulocytes 0.32 (H) 0.00 - 0.07 K/uL    Comment: Performed at [facility], [place] [address] [place], [place] [address]  Comprehensive metabolic panel     Status: Abnormal   Collection Time: 05/09/19  7:18 PM  Result Value Ref Range   Sodium 134 (L) 135 - 145 mmol/L   Potassium 4.5 3.5 - 5.1 mmol/L   Chloride 100 98 - 111 mmol/L   CO2 22 22 - 32 mmol/L   Glucose, Bld 114 (H) 70 - 99 mg/dL   BUN 19 8 - 23 mg/dL   Creatinine, Ser 1.13 (H) 0.44 - 1.00 mg/dL   Calcium 10.0 8.9 - 10.3 mg/dL   Total Protein 7.7 6.5 - 8.1 g/dL   Albumin 3.7 3.5 - 5.0 g/dL   AST 17 15 - 41 U/L   ALT 15 0 - 44 U/L   Alkaline Phosphatase 88 38 - 126 U/L   Total Bilirubin 0.6 0.3 - 1.2 mg/dL   GFR calc non Af Amer 47 (L) >60 mL/min   GFR calc Af Amer 55 (L) >60 mL/min   Anion gap 12 5 - 15    Comment: Performed at [facility], [place] [address], [place], [place] [address]  Lipase     Status: None   Collection Time: 05/09/19  7:18 PM  Result Value Ref Range   Lipase 34 11 - 51 U/L    Comment: Performed at [facility], [place] [address], [place], [place] [address]  Urinalysis, Routine w reflex microscopic     Status: Abnormal   Collection Time: 05/09/19  7:19 PM  Result Value Ref Range   Color, Urine YELLOW YELLOW   APPearance HAZY (A) CLEAR   Specific Gravity,  Urine 1.010 1.005 -  1.030   pH 6.0 5.0 - 8.0   Glucose, UA NEGATIVE NEGATIVE mg/dL   Hgb urine dipstick SMALL (A) NEGATIVE   Bilirubin Urine NEGATIVE NEGATIVE   Ketones, ur NEGATIVE NEGATIVE mg/dL   Protein, ur 30 (A) NEGATIVE mg/dL   Nitrite NEGATIVE NEGATIVE   Leukocytes,Ua LARGE (A) NEGATIVE   RBC / HPF 0-5 0 - 5 RBC/hpf   WBC, UA >50 (H) 0 - 5 WBC/hpf   Bacteria, UA MANY (A) NONE SEEN   WBC Clumps PRESENT     Comment: Performed at [facility], [place] [address] [place], [place] [address]   DG Lumbar Spine Complete  Result Date: 05/09/2019 CLINICAL DATA:  Fall with weakness. EXAM: LUMBAR SPINE - COMPLETE 4+ VIEW COMPARISON:  Lumbar MRI dated May 06, 2016. FINDINGS: There is no displaced fracture involving the lumbar spine. There is questionable height loss of the T11 vertebral body. There is age-indeterminate height loss of the superior endplate of the L4 vertebral body. Aortic calcifications are noted. Multilevel disc height loss is noted throughout the lumbar spine, greatest at the L2-L3 level. IMPRESSION: 1. Age-indeterminate height loss of the L4 vertebral body. Correlation with point tenderness is recommended. This may represent an acute compression fracture in the appropriate clinical setting. 2. Questionable height loss of the T11 vertebral body. 3. Multilevel degenerative changes throughout the lumbar spine. 4. Aortic calcifications are noted. 5. Large stool burden at the level of the rectum. Electronically Signed   By: Constance Holster M.D.   On: 05/09/2019 20:04   DG Abdomen Acute W/Chest  Result Date: 05/09/2019 CLINICAL DATA:  Fall with weakness. Constipation. EXAM: DG ABDOMEN ACUTE W/ 1V CHEST COMPARISON:  None. FINDINGS: There is a large stool burden at the level of the rectum, concerning for fecal impaction. There is a moderate amount of stool in the descending colon. There is no radiographic evidence for small bowel obstruction. Degenerative changes  are noted throughout the thoracic spine. Aortic calcifications are noted. There are advanced degenerative changes of both femoroacetabular joints. There is elevation of the right hemidiaphragm. There is atelectasis at the lung bases. There is no pneumothorax. No large pleural effusion. Advanced degenerative changes are noted of the bilateral glenohumeral joints. The heart size is relatively normal. IMPRESSION: 1. Large stool burden at the level of the rectum, concerning for fecal impaction. 2. Moderate amount of stool in the descending colon. 3. Bibasilar atelectasis. 4. Advanced degenerative changes of both hips. 5. No acute cardiopulmonary process. Electronically Signed   By: Constance Holster M.D.   On: 05/09/2019 20:06    Pending Labs Unresulted Labs (From admission, onward)    Start     Ordered   05/10/19 0500  CBC  Tomorrow morning,   R     05/10/19 0015   05/10/19 0500  Comprehensive metabolic panel  Tomorrow morning,   R     05/10/19 0015   05/09/19 2306  SARS CORONAVIRUS 2 (TAT 6-24 HRS) Nasopharyngeal Nasopharyngeal Swab  (Tier 3 (TAT 6-24 hrs))  Once,   STAT    Question Answer Comment  Is this test for diagnosis or screening Screening   Symptomatic for COVID-19 as defined by CDC No   Hospitalized for COVID-19 No   Admitted to ICU for COVID-19 No   Previously tested for COVID-19 Yes   Resident in a congregate (group) care setting No   Employed in healthcare setting No   Pregnant No      05/09/19 2306   05/09/19  2139  Urine culture  Once,   STAT     05/09/19 2138          Vitals/Pain Today's Vitals   05/10/19 0010 05/10/19 0030 05/10/19 0130 05/10/19 0200  BP: (!) 149/79 126/82 (!) 151/99 (!) 150/87  Pulse: (!) 109 (!) 111 (!) 104 (!) 109  Resp: (!) 21 18 17  (!) 26  Temp:      TempSrc:      SpO2: 95% 93% 92% 95%    Isolation Precautions No active isolations  Medications Medications  acetaminophen (TYLENOL) tablet 650 mg (has no administration in time range)     Or  acetaminophen (TYLENOL) suppository 650 mg (has no administration in time range)  magnesium citrate solution 1 Bottle (has no administration in time range)  ondansetron (ZOFRAN) tablet 4 mg (has no administration in time range)    Or  ondansetron (ZOFRAN) injection 4 mg (has no administration in time range)  apixaban (ELIQUIS) tablet 5 mg (5 mg Oral Given 05/10/19 0159)  cefTRIAXone (ROCEPHIN) 1 g in sodium chloride 0.9 % 100 mL IVPB (0 g Intravenous Stopped 05/10/19 0200)  sodium phosphate (FLEET) 7-19 GM/118ML enema 1 enema (1 enema Rectal Given 05/10/19 0321)  0.9 %  sodium chloride infusion ( Intravenous New Bag/Given 05/10/19 0430)    Mobility walks with device Moderate fall risk   Focused Assessments N/A   R Recommendations: See Admitting Provider Note  Report given to:   Additional Notes: N/A

## 2019-05-10 NOTE — H&P (Signed)
History and Physical    [name] [id number] DOB: [birth year] DOA: 05/09/2019  PCP: Willey Blade, MD (Confirm with patient/family/NH records and if not entered, this has to be entered at [facility] point of entry) Patient coming from: HOME  I have personally briefly reviewed patient's old medical records in [place]  Chief Complaint: Generalized weakness and difficult to urinate  HPI: [name] is a 78 y.o. female with medical history significant of hypertension, hyperlipidemia and pulmonary embolism presented to ED for evaluation of severe generalized weakness, constipation and inability to urinate. Patient states that she is having difficulty in ambulation for the last few days and the condition continue to worsen. Patient further mentioned that she is having difficulty in urination and her urine has a burning sensation and color is also dark yellow. Patient is also complaining of severe constipation for the last 2 weeks that did not improve with drinking apple juice and prune juices. Patient denies fever chills, headache, dizziness, chest pain, shortness of breath, abdominal pain and diarrhea. ED Course: Patient was found to have UTI in the ED and x-ray abdomen showed stool impaction. ED physician gave patient fluids, IV Rocephin and ordered Fleet enema.  Review of Systems: As per HPI otherwise 10 point review of systems negative.    Past Medical History:  Diagnosis Date  . Allergy   . Arthritis    "joints; mostly on the right side" (01/11/2013)  . Breast cancer ([place])    "left" (01/11/2013)  . Cancer [facility])    endometrial  . Chronic lower back pain   . Cold    just started 01/02/13 cough  . Complication of anesthesia    very phobic about needles.last surgery had to sedate before doing iv  . Depression   . Exertional shortness of breath   . Hyperlipidemia   . Hypertension   . Osteoporosis    knees   . Personal history of radiation therapy 03/2013   left  .  Status post radiation therapy within last four weeks 03/19/13-05/03/13   lt breast 60.4GY  . Uterine cancer [facility])     Past Surgical History:  Procedure Laterality Date  . ABDOMINAL HYSTERECTOMY  12/14/2012  . BREAST BIOPSY Left 12/2012  . BREAST LUMPECTOMY Left 2014  . BREAST LUMPECTOMY WITH NEEDLE LOCALIZATION AND AXILLARY SENTINEL LYMPH NODE BX Left 01/11/2013  . BREAST LUMPECTOMY WITH NEEDLE LOCALIZATION AND AXILLARY SENTINEL LYMPH NODE BX Left 01/11/2013   Procedure: BREAST LUMPECTOMY WITH NEEDLE LOCALIZATION AND AXILLARY SENTINEL LYMPH NODE BX;  Surgeon: Rolm Bookbinder, MD;  Location: [place];  Service: General;  Laterality: Left;  . CHOLECYSTECTOMY  1970  . DILATION AND CURETTAGE OF UTERUS     "@ least one" (01/11/2013)  . FOOT TENDON SURGERY Right   . KNEE ARTHROSCOPY Right 1990's   "twice" (01/11/2013)  . REPLACEMENT TOTAL KNEE Bilateral 2011-2012  . TUBAL LIGATION       reports that she has never smoked. She has never used smokeless tobacco. She reports that she does not drink alcohol or use drugs.  Allergies  Allergen Reactions  . Iodine Rash    Family History  Problem Relation Age of Onset  . Breast cancer Mother 71  . Esophageal cancer Neg Hx   . Colon cancer Neg Hx   . Stomach cancer Neg Hx   . Rectal cancer Neg Hx        Prior to Admission medications   Medication Sig Start Date End Date Taking? Authorizing  Provider  acetaminophen (TYLENOL) 650 MG CR tablet Take 1,300 mg by mouth every 8 (eight) hours as needed for pain.    Yes [provider]  apixaban (ELIQUIS) 5 MG TABS tablet Take 5 mg by mouth 2 (two) times daily.   Yes [provider]  buPROPion (WELLBUTRIN SR) 150 MG 12 hr tablet Take 150 mg by mouth 2 (two) times daily. 11/11/15  Yes [provider]  carisoprodol (SOMA) 350 MG tablet Take 350 mg by mouth 2 (two) times daily.  12/15/12  Yes [provider]  DULoxetine (CYMBALTA) 20 MG capsule Take 1 capsule (20 mg total)  by mouth daily. 08/17/13  Yes Causey, Charlestine Massed, NP  eszopiclone (LUNESTA) 2 MG TABS tablet Take 2 mg by mouth at bedtime as needed for sleep.  04/05/19  Yes [provider]  exemestane (AROMASIN) 25 MG tablet Take 1 tablet by mouth once daily Patient taking differently: Take 25 mg by mouth daily after breakfast.  02/12/19  Yes Magrinat, Virgie Dad, MD  fenofibrate 160 MG tablet Take 160 mg by mouth daily. 10/02/15  Yes [provider]  gabapentin (NEURONTIN) 300 MG capsule TAKE 1 CAPSULE BY MOUTH AT BEDTIME Patient taking differently: Take 300 mg by mouth 2 (two) times daily.  02/15/18  Yes Magrinat, Virgie Dad, MD  HYDROcodone-acetaminophen (NORCO/VICODIN) 5-325 MG tablet Take 1 tablet by mouth every 8 (eight) hours as needed for moderate pain.  03/30/19  Yes [provider]  Menthol, Topical Analgesic, (BIOFREEZE ROLL-ON) 4 % GEL Apply 1 application topically 2 (two) times daily as needed (heel pain).    Yes [provider]  metFORMIN (GLUCOPHAGE) 500 MG tablet Take 1 tablet (500 mg total) by mouth 2 (two) times daily with a meal. 12/09/15  Yes [name], MD  metoprolol succinate (TOPROL-XL) 50 MG 24 hr tablet Take 50 mg by mouth 2 (two) times daily.  12/19/12  Yes [provider]  Nutritional Supplements (QUINOA KALE & HEMP PO) Take 5 drops by mouth daily.    Yes [provider]  spironolactone (ALDACTONE) 50 MG tablet Take 50 mg by mouth every morning. 04/24/17  Yes [provider]    Physical Exam: Vitals:   05/10/19 0130 05/10/19 0200 05/10/19 0541 05/10/19 0623  BP: (!) 151/99 (!) 150/87 (!) 160/98   Pulse: (!) 104 (!) 109 98   Resp: 17 (!) 26 19   Temp:   98.4 F (36.9 C)   TempSrc:   Oral   SpO2: 92% 95% 98%   Weight:    129.3 kg  Height:    5\' 6"  (1.676 m)    Constitutional: NAD, calm, comfortable Vitals:   05/10/19 0130 05/10/19 0200 05/10/19 0541 05/10/19 0623  BP: (!) 151/99 (!) 150/87 (!) 160/98   Pulse:  (!) 104 (!) 109 98   Resp: 17 (!) 26 19   Temp:   98.4 F (36.9 C)   TempSrc:   Oral   SpO2: 92% 95% 98%   Weight:    129.3 kg  Height:    5\' 6"  (1.676 m)   Eyes: PERRL, lids and conjunctivae normal ENMT: Mucous membranes are moist. Posterior pharynx clear of any exudate or lesions.Normal dentition.  Neck: normal, supple, no masses, no thyromegaly Respiratory: clear to auscultation bilaterally, no wheezing, no crackles. Normal respiratory effort. No accessory muscle use.  Cardiovascular: Regular rate and rhythm, no murmurs / rubs / gallops. No extremity edema. 2+ pedal pulses. No carotid bruits.  Abdomen:  Mild tenderness in suprapubic region, no masses palpated. No hepatosplenomegaly. Bowel sounds positive.  Musculoskeletal: no clubbing / cyanosis. No joint deformity upper and lower extremities. Good ROM, no contractures. Normal muscle tone.  Skin: no rashes, lesions, ulcers. No induration Neurologic: CN 2-12 grossly intact. Sensation intact, DTR normal. Strength 5/5 in all 4.  Psychiatric: Normal judgment and insight. Alert and oriented x 3. Normal mood.    Labs on Admission: I have personally reviewed following labs and imaging studies  CBC: Recent Labs  Lab 05/09/19 1918  WBC 8.4  NEUTROABS 5.8  HGB 14.9  HCT 49.6*  MCV 90.5  PLT 99991111   Basic Metabolic Panel: Recent Labs  Lab 05/09/19 1918  NA 134*  K 4.5  CL 100  CO2 22  GLUCOSE 114*  BUN 19  CREATININE 1.13*  CALCIUM 10.0   GFR: Estimated Creatinine Clearance: 58.4 mL/min (A) (by C-G formula based on SCr of 1.13 mg/dL (H)). Liver Function Tests: Recent Labs  Lab 05/09/19 1918  AST 17  ALT 15  ALKPHOS 88  BILITOT 0.6  PROT 7.7  ALBUMIN 3.7   Recent Labs  Lab 05/09/19 1918  LIPASE 34   No results for input(s): AMMONIA in the last 168 hours. Coagulation Profile: No results for input(s): INR, PROTIME in the last 168 hours. Cardiac Enzymes: No results for input(s): CKTOTAL, CKMB, CKMBINDEX,  TROPONINI in the last 168 hours. BNP (last 3 results) No results for input(s): PROBNP in the last 8760 hours. HbA1C: No results for input(s): HGBA1C in the last 72 hours. CBG: No results for input(s): GLUCAP in the last 168 hours. Lipid Profile: No results for input(s): CHOL, HDL, LDLCALC, TRIG, CHOLHDL, LDLDIRECT in the last 72 hours. Thyroid Function Tests: No results for input(s): TSH, T4TOTAL, FREET4, T3FREE, THYROIDAB in the last 72 hours. Anemia Panel: No results for input(s): VITAMINB12, FOLATE, FERRITIN, TIBC, IRON, RETICCTPCT in the last 72 hours. Urine analysis:    Component Value Date/Time   COLORURINE YELLOW 05/09/2019 1919   APPEARANCEUR HAZY (A) 05/09/2019 1919   LABSPEC 1.010 05/09/2019 1919   PHURINE 6.0 05/09/2019 1919   GLUCOSEU NEGATIVE 05/09/2019 1919   HGBUR SMALL (A) 05/09/2019 1919   BILIRUBINUR NEGATIVE 05/09/2019 1919   KETONESUR NEGATIVE 05/09/2019 1919   PROTEINUR 30 (A) 05/09/2019 1919   UROBILINOGEN 0.2 03/31/2010 1116   NITRITE NEGATIVE 05/09/2019 1919   LEUKOCYTESUR LARGE (A) 05/09/2019 1919    Radiological Exams on Admission: DG Lumbar Spine Complete  Result Date: 05/09/2019 CLINICAL DATA:  Fall with weakness. EXAM: LUMBAR SPINE - COMPLETE 4+ VIEW COMPARISON:  Lumbar MRI dated May 06, 2016. FINDINGS: There is no displaced fracture involving the lumbar spine. There is questionable height loss of the T11 vertebral body. There is age-indeterminate height loss of the superior endplate of the L4 vertebral body. Aortic calcifications are noted. Multilevel disc height loss is noted throughout the lumbar spine, greatest at the L2-L3 level. IMPRESSION: 1. Age-indeterminate height loss of the L4 vertebral body. Correlation with point tenderness is recommended. This may represent an acute compression fracture in the appropriate clinical setting. 2. Questionable height loss of the T11 vertebral body. 3. Multilevel degenerative changes throughout the lumbar  spine. 4. Aortic calcifications are noted. 5. Large stool burden at the level of the rectum. Electronically Signed   By: Constance Holster M.D.   On: 05/09/2019 20:04   DG Abdomen Acute W/Chest  Result Date: 05/09/2019 CLINICAL DATA:  Fall with weakness. Constipation. EXAM: DG ABDOMEN ACUTE W/ 1V  CHEST COMPARISON:  None. FINDINGS: There is a large stool burden at the level of the rectum, concerning for fecal impaction. There is a moderate amount of stool in the descending colon. There is no radiographic evidence for small bowel obstruction. Degenerative changes are noted throughout the thoracic spine. Aortic calcifications are noted. There are advanced degenerative changes of both femoroacetabular joints. There is elevation of the right hemidiaphragm. There is atelectasis at the lung bases. There is no pneumothorax. No large pleural effusion. Advanced degenerative changes are noted of the bilateral glenohumeral joints. The heart size is relatively normal. IMPRESSION: 1. Large stool burden at the level of the rectum, concerning for fecal impaction. 2. Moderate amount of stool in the descending colon. 3. Bibasilar atelectasis. 4. Advanced degenerative changes of both hips. 5. No acute cardiopulmonary process. Electronically Signed   By: Constance Holster M.D.   On: 05/09/2019 20:06    EKG: Independently reviewed . Sinus tachycardia without acute ST and T wave changes  Assessment/Plan Active Problems:   Weakness generalized Patient is having generalized weakness most likely secondary to poor oral intake and UTI. IV fluids IV ceftriaxone.  Acute cystitis without hematuria Continue IV ceftriaxone Urine cultures ordered  Severe constipation Fleet enema ordered by the ED physician Magnesium citrate as needed if constipation does not improve    DVT prophylaxis: Continue home Eliquis 5 mg twice daily. Code Status: Full code  Disposition Plan: Patient will be discharged home most probably  within 2 days.   Consults called: None  Admission status: Observation    Edmonia Lynch MD [facility]  If 7PM-7AM, please contact night-coverage [email or web address]   05/10/2019, 6:24 AM

## 2019-05-10 NOTE — Plan of Care (Signed)
Initiated POC 

## 2019-05-10 NOTE — Progress Notes (Addendum)
PROGRESS NOTE    [name]  [id number] DOB: [birth year] DOA: 05/09/2019 PCP: Willey Blade, MD   Brief Narrative:  78 year old female with PMH of uterine cancer s/p radiation, osteoporosis, depression, chronic back pain, breast cancer, anxiety admitted with constipation, difficulty urinating and lower extremity weakness.   Assessment & Plan:   Principal Problem:   Acute cystitis Active Problems:   Pulmonary embolism (HCC)   Weakness   CKD (chronic kidney disease), stage III  Presumed UTI UA dirty.  Urine culture pending Continue ceftriaxone  Severe constipation Partially relieved with enema Magnesium citrate 1 dose Senna, Colace, MiraLAX scheduled  Generalized weakness With more pronounced lower extremity weakness Unclear etiology.  Patient is a poor historian.  Notes she has had lower extremity weakness for weeks now due to lack of rehab. Physical therapy, OT  Sinus tachycardia Improving  ?sepsis   CKD stage IIIb Creatinine at baseline  Renally dose medications   History of pulmonary embolism Continue Eliquis  Depression/anxiety Continue Cymbalta  Hypertension Continue home meds   DVT prophylaxis: [id number] Code Status: Full code  Family Communication:  Disposition Plan: Pending medical stability, case management consulted to help with disposition  Needs inpatient stay to further evaluate generalized weakness and tachycardia.   Consultants:   None  Procedures:  None  Antimicrobials:   Ceftriaxone   Subjective: Patient seen and examined.  Complains of bilateral lower extremity weakness and knee pain.  She is a poor historian but notes weakness been going on for several weeks now. She does not move around the house much.  She has a personal aide who comes home.  Denies any urinary complaints.  Severe constipation is improved since the enema but she feels she has more inside her.  Objective: Vitals:   05/10/19 0200 05/10/19  0541 05/10/19 0623 05/10/19 0623  BP: (!) 150/87 (!) 160/98  (!) 157/87  Pulse: (!) 109 98  (!) 105  Resp: (!) 26 19  20   Temp:  98.4 F (36.9 C)  98.4 F (36.9 C)  TempSrc:  Oral  Oral  SpO2: 95% 98%  96%  Weight:   129.3 kg   Height:   5\' 6"  (1.676 m)     Intake/Output Summary (Last 24 hours) at 05/10/2019 0811 Last data filed at 05/10/2019 0630 Gross per 24 hour  Intake --  Output 300 ml  Net -300 ml   Filed Weights   05/10/19 0623  Weight: 129.3 kg    Examination:  General exam: Appears calm and comfortable Respiratory system: Clear to auscultation. Respiratory effort normal. Cardiovascular system: S1 & S2 heard, RRR. No JVD. No pedal edema. Gastrointestinal system: Abdomen is nondistended, soft and nontender. No organomegaly or masses felt. Normal bowel sounds heard. Central nervous system: Alert and oriented.  Bilateral upper extremity strength intact but she denies able to abduct overhead. Extremities: She is barely able to lift her bilateral lower extremities against gravity (unclear if this is real weakness or lack of effort and deconditioning) (ongoing for several weeks per patient) Skin: No rashes, lesions or ulcers Psychiatry: Flat affect   Data Reviewed: I have personally reviewed following labs and imaging studies  CBC: Recent Labs  Lab 05/09/19 1918 05/10/19 0650  WBC 8.4 12.0*  NEUTROABS 5.8  --   HGB 14.9 14.6  HCT 49.6* 47.7*  MCV 90.5 89.8  PLT 319 [id number]   Basic Metabolic Panel: Recent Labs  Lab 05/09/19 1918 05/10/19 0650  NA 134* 133*  K 4.5 4.1  CL  100 100  CO2 22 21*  GLUCOSE 114* 125*  BUN 19 22  CREATININE 1.13* 1.36*  CALCIUM 10.0 9.9   GFR: Estimated Creatinine Clearance: 48.5 mL/min (A) (by C-G formula based on SCr of 1.36 mg/dL (H)). Liver Function Tests: Recent Labs  Lab 05/09/19 1918 05/10/19 0650  AST 17 21  ALT 15 17  ALKPHOS 88 94  BILITOT 0.6 0.8  PROT 7.7 7.4  ALBUMIN 3.7 3.7   Recent Labs  Lab  05/09/19 1918  LIPASE 34   No results for input(s): AMMONIA in the last 168 hours. Coagulation Profile: No results for input(s): INR, PROTIME in the last 168 hours. Cardiac Enzymes: No results for input(s): CKTOTAL, CKMB, CKMBINDEX, TROPONINI in the last 168 hours. BNP (last 3 results) No results for input(s): PROBNP in the last 8760 hours. HbA1C: No results for input(s): HGBA1C in the last 72 hours. CBG: No results for input(s): GLUCAP in the last 168 hours. Lipid Profile: No results for input(s): CHOL, HDL, LDLCALC, TRIG, CHOLHDL, LDLDIRECT in the last 72 hours. Thyroid Function Tests: No results for input(s): TSH, T4TOTAL, FREET4, T3FREE, THYROIDAB in the last 72 hours. Anemia Panel: No results for input(s): VITAMINB12, FOLATE, FERRITIN, TIBC, IRON, RETICCTPCT in the last 72 hours. Sepsis Labs: No results for input(s): PROCALCITON, LATICACIDVEN in the last 168 hours.  No results found for this or any previous visit (from the past 240 hour(s)).       Radiology Studies: DG Lumbar Spine Complete  Result Date: 05/09/2019 CLINICAL DATA:  Fall with weakness. EXAM: LUMBAR SPINE - COMPLETE 4+ VIEW COMPARISON:  Lumbar MRI dated May 06, 2016. FINDINGS: There is no displaced fracture involving the lumbar spine. There is questionable height loss of the T11 vertebral body. There is age-indeterminate height loss of the superior endplate of the L4 vertebral body. Aortic calcifications are noted. Multilevel disc height loss is noted throughout the lumbar spine, greatest at the L2-L3 level. IMPRESSION: 1. Age-indeterminate height loss of the L4 vertebral body. Correlation with point tenderness is recommended. This may represent an acute compression fracture in the appropriate clinical setting. 2. Questionable height loss of the T11 vertebral body. 3. Multilevel degenerative changes throughout the lumbar spine. 4. Aortic calcifications are noted. 5. Large stool burden at the level of the  rectum. Electronically Signed   By: Constance Holster M.D.   On: 05/09/2019 20:04   DG Abdomen Acute W/Chest  Result Date: 05/09/2019 CLINICAL DATA:  Fall with weakness. Constipation. EXAM: DG ABDOMEN ACUTE W/ 1V CHEST COMPARISON:  None. FINDINGS: There is a large stool burden at the level of the rectum, concerning for fecal impaction. There is a moderate amount of stool in the descending colon. There is no radiographic evidence for small bowel obstruction. Degenerative changes are noted throughout the thoracic spine. Aortic calcifications are noted. There are advanced degenerative changes of both femoroacetabular joints. There is elevation of the right hemidiaphragm. There is atelectasis at the lung bases. There is no pneumothorax. No large pleural effusion. Advanced degenerative changes are noted of the bilateral glenohumeral joints. The heart size is relatively normal. IMPRESSION: 1. Large stool burden at the level of the rectum, concerning for fecal impaction. 2. Moderate amount of stool in the descending colon. 3. Bibasilar atelectasis. 4. Advanced degenerative changes of both hips. 5. No acute cardiopulmonary process. Electronically Signed   By: Constance Holster M.D.   On: 05/09/2019 20:06        Scheduled Meds: . apixaban  5 mg Oral BID  .  Chlorhexidine Gluconate Cloth  6 each Topical Daily   Continuous Infusions:   LOS: 0 days    Time spent: Spent more than 30 minutes in coordinating care for this patient including bedside patient care.    Lucky Cowboy, MD [facility] If 7PM-7AM, please contact night-coverage 05/10/2019, 8:11 AM

## 2019-05-11 DIAGNOSIS — Z923 Personal history of irradiation: Secondary | ICD-10-CM | POA: Diagnosis not present

## 2019-05-11 DIAGNOSIS — B9620 Unspecified Escherichia coli [E. coli] as the cause of diseases classified elsewhere: Secondary | ICD-10-CM | POA: Diagnosis present

## 2019-05-11 DIAGNOSIS — M810 Age-related osteoporosis without current pathological fracture: Secondary | ICD-10-CM | POA: Diagnosis present

## 2019-05-11 DIAGNOSIS — Z90710 Acquired absence of both cervix and uterus: Secondary | ICD-10-CM | POA: Diagnosis not present

## 2019-05-11 DIAGNOSIS — R000 Tachycardia, unspecified: Secondary | ICD-10-CM | POA: Diagnosis present

## 2019-05-11 DIAGNOSIS — Z7984 Long term (current) use of oral hypoglycemic drugs: Secondary | ICD-10-CM | POA: Diagnosis not present

## 2019-05-11 DIAGNOSIS — F419 Anxiety disorder, unspecified: Secondary | ICD-10-CM | POA: Diagnosis present

## 2019-05-11 DIAGNOSIS — I129 Hypertensive chronic kidney disease with stage 1 through stage 4 chronic kidney disease, or unspecified chronic kidney disease: Secondary | ICD-10-CM | POA: Diagnosis present

## 2019-05-11 DIAGNOSIS — Z7901 Long term (current) use of anticoagulants: Secondary | ICD-10-CM | POA: Diagnosis not present

## 2019-05-11 DIAGNOSIS — Z86711 Personal history of pulmonary embolism: Secondary | ICD-10-CM | POA: Diagnosis not present

## 2019-05-11 DIAGNOSIS — R531 Weakness: Secondary | ICD-10-CM | POA: Diagnosis not present

## 2019-05-11 DIAGNOSIS — G8929 Other chronic pain: Secondary | ICD-10-CM | POA: Diagnosis present

## 2019-05-11 DIAGNOSIS — Z6841 Body Mass Index (BMI) 40.0 and over, adult: Secondary | ICD-10-CM | POA: Diagnosis not present

## 2019-05-11 DIAGNOSIS — Z8542 Personal history of malignant neoplasm of other parts of uterus: Secondary | ICD-10-CM | POA: Diagnosis not present

## 2019-05-11 DIAGNOSIS — N390 Urinary tract infection, site not specified: Secondary | ICD-10-CM | POA: Diagnosis present

## 2019-05-11 DIAGNOSIS — Z803 Family history of malignant neoplasm of breast: Secondary | ICD-10-CM | POA: Diagnosis not present

## 2019-05-11 DIAGNOSIS — E785 Hyperlipidemia, unspecified: Secondary | ICD-10-CM | POA: Diagnosis present

## 2019-05-11 DIAGNOSIS — K5900 Constipation, unspecified: Secondary | ICD-10-CM

## 2019-05-11 DIAGNOSIS — N1832 Chronic kidney disease, stage 3b: Secondary | ICD-10-CM | POA: Diagnosis present

## 2019-05-11 DIAGNOSIS — N3000 Acute cystitis without hematuria: Secondary | ICD-10-CM | POA: Diagnosis present

## 2019-05-11 DIAGNOSIS — K5641 Fecal impaction: Secondary | ICD-10-CM | POA: Diagnosis present

## 2019-05-11 DIAGNOSIS — M160 Bilateral primary osteoarthritis of hip: Secondary | ICD-10-CM | POA: Diagnosis present

## 2019-05-11 DIAGNOSIS — M549 Dorsalgia, unspecified: Secondary | ICD-10-CM | POA: Diagnosis present

## 2019-05-11 DIAGNOSIS — F329 Major depressive disorder, single episode, unspecified: Secondary | ICD-10-CM | POA: Diagnosis present

## 2019-05-11 DIAGNOSIS — Z20828 Contact with and (suspected) exposure to other viral communicable diseases: Secondary | ICD-10-CM | POA: Diagnosis present

## 2019-05-11 LAB — URINE CULTURE: Culture: >=100000 — AB

## 2019-05-11 MED ORDER — SODIUM CHLORIDE 0.9 % IV SOLN
2.0000 g | INTRAVENOUS | Status: DC
  Administered 2019-05-11 – 2019-05-14 (×4): 2 g via INTRAVENOUS
  Filled 2019-05-11: qty 2
  Filled 2019-05-11: qty 20
  Filled 2019-05-11 (×3): qty 2

## 2019-05-11 MED ORDER — PEG 3350-KCL-NA BICARB-NACL 420 G PO SOLR
2000.0000 mL | Freq: Once | ORAL | Status: DC
  Filled 2019-05-11: qty 4000

## 2019-05-11 NOTE — Progress Notes (Signed)
PROGRESS NOTE    [name]  [id number] DOB: 1941/03/07 DOA: 05/09/2019 PCP: Willey Blade, MD   Brief Narrative:  78 year old female with PMH of uterine cancer s/p radiation, osteoporosis, depression, chronic back pain, breast cancer, anxiety admitted with constipation, difficulty urinating and lower extremity weakness.  Assessment & Plan:   Principal Problem:   Acute cystitis Active Problems:   Pulmonary embolism (HCC)   Weakness   CKD (chronic kidney disease), stage III   UTI (urinary tract infection)  E.coli UTI Urine culture growing pan sensitive Ecoli Continue ceftriaxone Once patient has a good BM, will remove foley catheter. Suspect severe constipation had a role to play in urinary retention.  Severe constipation Partially relieved with enema and one dose of mag citrate  Encouraged increased water intake and high fibre diet  PEG 2000 cc per patient's request  Senna, Colace, MiraLAX scheduled  Generalized weakness With more pronounced lower extremity weakness. Suspect this is in setting of deconditioning  Patient is a poor historian.  Notes she has had lower extremity weakness for weeks now due to lack of rehab. Physical therapy, OT  Sinus tachycardia Improving  ?sepsis   CKD stage IIIb Creatinine at baseline  Renally dose medications   History of pulmonary embolism Continue Eliquis  Depression/anxiety Continue Cymbalta  Hypertension Continue home meds   DVT prophylaxis: [id number] Code Status: Full code  Family Communication: Spoke to Carrolyn Meiers (patient's friend and a hospice/ ER nurse) via telephone at [phone] while in the patient's room. Bonnita Nasuti has been helping to look for ALF since the patient has not been able to care for herself for sometime now. Patient has an aide that comes in for a couple of hours but that has not been adequate assistance for the patient. Bonnita Nasuti states the patient has been in 'denial' that she needs help and  things have gone downhill since her daughter died unexpectedly few years ago. States patient is reluctant to get up because of pain and fear of fall; therefore not ambulated much. She requested a call back from the Education officer, museum. Informed case management to give her a call back.   Disposition Plan: PT recommends SNF, patient agreeable to this. Tentative plan for discharge on Monday. covid screening test to be ordered on Sunday (12/20)  Needs inpatient stay to further evaluate generalized weakness and tachycardia.   Consultants:   None  Procedures:  None  Antimicrobials:   Ceftriaxone   Subjective: Patient seen and examined.  No acute events overnight. She did not like the taste of magnesium citrate. She rather have the prep they give for colonoscopy. Denies any abdominal pain, nausea.   Objective: Vitals:   05/10/19 0623 05/10/19 1333 05/10/19 2141 05/11/19 0553  BP: (!) 157/87 120/65 136/64 135/67  Pulse: (!) 105 (!) 101 (!) 109 93  Resp: 20 16 16 20   Temp: 98.4 F (36.9 C) 99.1 F (37.3 C) 98.6 F (37 C) 97.8 F (36.6 C)  TempSrc: Oral Oral Oral   SpO2: 96% 98% 98% 94%  Weight:      Height:        Intake/Output Summary (Last 24 hours) at 05/11/2019 1624 Last data filed at 05/11/2019 1054 Gross per 24 hour  Intake 580 ml  Output 950 ml  Net -370 ml   Filed Weights   05/10/19 0623  Weight: 129.3 kg    Examination:  General exam: Appears calm and comfortable Respiratory system: Clear to auscultation. Respiratory effort normal. Cardiovascular system: S1 & S2 heard,  RRR. No JVD. No pedal edema. Gastrointestinal system: Abdomen is nondistended, soft and nontender. No organomegaly or masses felt. Normal bowel sounds heard. Central nervous system: Alert and oriented.  Bilateral upper extremity strength intact but she denies able to abduct overhead. Extremities: She is barely able to lift her bilateral lower extremities against gravity (unclear if this is real  weakness or lack of effort and deconditioning) (ongoing for several weeks per patient) Skin: No rashes, lesions or ulcers Psychiatry: Flat affect   Data Reviewed: I have personally reviewed following labs and imaging studies  CBC: Recent Labs  Lab 05/09/19 1918 05/10/19 0650  WBC 8.4 12.0*  NEUTROABS 5.8  --   HGB 14.9 14.6  HCT 49.6* 47.7*  MCV 90.5 89.8  PLT 319 [id number]   Basic Metabolic Panel: Recent Labs  Lab 05/09/19 1918 05/10/19 0650  NA 134* 133*  K 4.5 4.1  CL 100 100  CO2 22 21*  GLUCOSE 114* 125*  BUN 19 22  CREATININE 1.13* 1.36*  CALCIUM 10.0 9.9   GFR: Estimated Creatinine Clearance: 48.5 mL/min (A) (by C-G formula based on SCr of 1.36 mg/dL (H)). Liver Function Tests: Recent Labs  Lab 05/09/19 1918 05/10/19 0650  AST 17 21  ALT 15 17  ALKPHOS 88 94  BILITOT 0.6 0.8  PROT 7.7 7.4  ALBUMIN 3.7 3.7   Recent Labs  Lab 05/09/19 1918  LIPASE 34   No results for input(s): AMMONIA in the last 168 hours. Coagulation Profile: No results for input(s): INR, PROTIME in the last 168 hours. Cardiac Enzymes: No results for input(s): CKTOTAL, CKMB, CKMBINDEX, TROPONINI in the last 168 hours. BNP (last 3 results) No results for input(s): PROBNP in the last 8760 hours. HbA1C: No results for input(s): HGBA1C in the last 72 hours. CBG: No results for input(s): GLUCAP in the last 168 hours. Lipid Profile: No results for input(s): CHOL, HDL, LDLCALC, TRIG, CHOLHDL, LDLDIRECT in the last 72 hours. Thyroid Function Tests: No results for input(s): TSH, T4TOTAL, FREET4, T3FREE, THYROIDAB in the last 72 hours. Anemia Panel: No results for input(s): VITAMINB12, FOLATE, FERRITIN, TIBC, IRON, RETICCTPCT in the last 72 hours. Sepsis Labs: No results for input(s): PROCALCITON, LATICACIDVEN in the last 168 hours.  Recent Results (from the past 240 hour(s))  Urine culture     Status: Abnormal   Collection Time: 05/09/19  9:39 PM   Specimen: Urine, Catheterized   Result Value Ref Range Status   Specimen Description   Final    Urine Performed at [facility] [address], [place], [place] [address]    Special Requests   Final    NONE Performed at [facility], [place] [address], [place], [place] [address]    Culture >=100,000 COLONIES/mL ESCHERICHIA COLI (A)  Final   Report Status 05/11/2019 FINAL  Final   Organism ID, Bacteria ESCHERICHIA COLI (A)  Final      Susceptibility   Escherichia coli - MIC*    AMPICILLIN <=2 SENSITIVE Sensitive     CEFAZOLIN <=4 SENSITIVE Sensitive     CEFEPIME <=1 SENSITIVE Sensitive     CEFTAZIDIME <=1 SENSITIVE Sensitive     CEFTRIAXONE <=1 SENSITIVE Sensitive     CIPROFLOXACIN <=0.25 SENSITIVE Sensitive     GENTAMICIN <=1 SENSITIVE Sensitive     IMIPENEM <=0.25 SENSITIVE Sensitive     TRIMETH/SULFA <=20 SENSITIVE Sensitive     AMPICILLIN/SULBACTAM <=2 SENSITIVE Sensitive     PIP/TAZO <=4 SENSITIVE Sensitive     * >=  100,000 COLONIES/mL ESCHERICHIA COLI  SARS CORONAVIRUS 2 (TAT 6-24 HRS) Nasopharyngeal Nasopharyngeal Swab     Status: None   Collection Time: 05/10/19  1:55 AM   Specimen: Nasopharyngeal Swab  Result Value Ref Range Status   SARS Coronavirus 2 NEGATIVE NEGATIVE Final    Comment: (NOTE) SARS-CoV-2 target nucleic acids are NOT DETECTED. The SARS-CoV-2 RNA is generally detectable in upper and lower respiratory specimens during the acute phase of infection. Negative results do not preclude SARS-CoV-2 infection, do not rule out co-infections with other pathogens, and should not be used as the sole basis for treatment or other patient management decisions. Negative results must be combined with clinical observations, patient history, and epidemiological information. The expected result is Negative. Fact Sheet for Patients: SugarRoll.be Fact Sheet for Healthcare Providers: https://www.woods-mathews.com/ This test is  not yet approved or cleared by the [place] FDA and  has been authorized for detection and/or diagnosis of SARS-CoV-2 by FDA under an Emergency Use Authorization (EUA). This EUA will remain  in effect (meaning this test can be used) for the duration of the COVID-19 declaration under Section 56 4(b)(1) of the Act, 21 U.S.C. section 360bbb-3(b)(1), unless the authorization is terminated or revoked sooner. Performed at [facility], [place] [address], [place], [place] [address]          Radiology Studies: DG Lumbar Spine Complete  Result Date: 05/09/2019 CLINICAL DATA:  Fall with weakness. EXAM: LUMBAR SPINE - COMPLETE 4+ VIEW COMPARISON:  Lumbar MRI dated May 06, 2016. FINDINGS: There is no displaced fracture involving the lumbar spine. There is questionable height loss of the T11 vertebral body. There is age-indeterminate height loss of the superior endplate of the L4 vertebral body. Aortic calcifications are noted. Multilevel disc height loss is noted throughout the lumbar spine, greatest at the L2-L3 level. IMPRESSION: 1. Age-indeterminate height loss of the L4 vertebral body. Correlation with point tenderness is recommended. This may represent an acute compression fracture in the appropriate clinical setting. 2. Questionable height loss of the T11 vertebral body. 3. Multilevel degenerative changes throughout the lumbar spine. 4. Aortic calcifications are noted. 5. Large stool burden at the level of the rectum. Electronically Signed   By: Constance Holster M.D.   On: 05/09/2019 20:04   DG Abdomen Acute W/Chest  Result Date: 05/09/2019 CLINICAL DATA:  Fall with weakness. Constipation. EXAM: DG ABDOMEN ACUTE W/ 1V CHEST COMPARISON:  None. FINDINGS: There is a large stool burden at the level of the rectum, concerning for fecal impaction. There is a moderate amount of stool in the descending colon. There is no radiographic evidence for small bowel obstruction. Degenerative changes  are noted throughout the thoracic spine. Aortic calcifications are noted. There are advanced degenerative changes of both femoroacetabular joints. There is elevation of the right hemidiaphragm. There is atelectasis at the lung bases. There is no pneumothorax. No large pleural effusion. Advanced degenerative changes are noted of the bilateral glenohumeral joints. The heart size is relatively normal. IMPRESSION: 1. Large stool burden at the level of the rectum, concerning for fecal impaction. 2. Moderate amount of stool in the descending colon. 3. Bibasilar atelectasis. 4. Advanced degenerative changes of both hips. 5. No acute cardiopulmonary process. Electronically Signed   By: Constance Holster M.D.   On: 05/09/2019 20:06        Scheduled Meds: . apixaban  5 mg Oral BID  . buPROPion  150 mg Oral BID  . carisoprodol  350 mg Oral BID  .  Chlorhexidine Gluconate Cloth  6 each Topical Daily  . docusate sodium  100 mg Oral Daily  . DULoxetine  20 mg Oral Daily  . exemestane  25 mg Oral Daily  . fenofibrate  160 mg Oral Daily  . gabapentin  300 mg Oral QHS  . metoprolol succinate  50 mg Oral BID  . polyethylene glycol  17 g Oral Daily  . polyethylene glycol-electrolytes  2,000 mL Oral Once  . senna  1 tablet Oral Daily  . spironolactone  50 mg Oral [id number]   Continuous Infusions: . cefTRIAXone (ROCEPHIN)  IV 2 g (05/11/19 1610)     LOS: 0 days    Time spent: Spent more than 30 minutes in coordinating care for this patient including bedside patient care.    Lucky Cowboy, MD [facility] If 7PM-7AM, please contact night-coverage 05/11/2019, 4:24 PM

## 2019-05-11 NOTE — Evaluation (Signed)
Physical Therapy Evaluation Patient Details Name: AANIA BENNETTE MRN: [id number] DOB: 11-Mar-1941 Today's Date: 05/11/2019   History of Present Illness  78 year old female with PMH of uterine cancer s/p radiation, osteoporosis, depression, chronic back pain, breast cancer, anxiety admitted with constipation, difficulty urinating and lower extremity weakness.Patient states that she is having difficulty in ambulation for the last few days and the condition continue to worsen.    Clinical Impression  ILIYANA SWEENY is 78 y.o. female admitted with above HPI and diagnosis. Patient is currently limited by functional impairments below (see PT problem list). Patient lives alone and requires assistance for mobility and ADL's at baseline. Pt with difficulty arranging [name] aide assist and has been largely bed bound the last 3 weeks following a fall. Patient currently requires Mod-Max assist +2 for bed mobility and transfers. Patient unable to transfer with RW and required use of Denna Haggard for transfer OOB and into recliner. Pt with heightened anxiety during transfer. Patient will benefit from continued skilled PT interventions to address impairments and progress independence with mobility, recommending SNF and 24/hour assistance. Acute PT will follow and progress as able.     Follow Up Recommendations SNF    Equipment Recommendations  None recommended by PT    Recommendations for Other Services       Precautions / Restrictions Precautions Precautions: Fall Restrictions Weight Bearing Restrictions: No      Mobility  Bed Mobility Overal bed mobility: Needs Assistance Bed Mobility: Supine to Sit     Supine to sit: Mod assist;HOB elevated;+2 for safety/equipment     General bed mobility comments: Pt unable to complete first attempt to sit up on Rt side of bed with c/o diffulcty due to Lt UE weakness. With extra time and effort pt able to complete supine to sit transfers to Lt side with use  of bed rail and HOB elevated. Pt walking Bil LE's off EOB and partially powering up trunk however required assist to fully raise up. HOB elevated to match setup at home (has adjustable bed). assist to powerup and scoot hips to full EOB position.  Transfers Overall transfer level: Needs assistance Equipment used: Rolling walker (2 wheeled) Transfers: Sit to/from Stand Sit to Stand: Max assist;+2 physical assistance;From elevated surface         General transfer comment: 2x attempt from elevated EOB with RW. pt unable to clear hips from mattress. Clarise Cruz stedy used for sit<>stand and although pt was very anxious about transfer she was able to complete with veral cues and max assist +2 to riase hips and perform anterior weigth shift for paddle clearance. Pt hooking BUE onto stedy bar to faciliatate powerup. She reports he walker at home has a similar function. Stedy used to transfer pt to bedside recliner which was elevated with blankets and pillows.  Ambulation/Gait           Stairs         Wheelchair Mobility    Modified Rankin (Stroke Patients Only)       Balance Overall balance assessment: Needs assistance Sitting-balance support: Bilateral upper extremity supported;Feet supported Sitting balance-Leahy Scale: Fair Sitting balance - Comments: sits with BUE support   Standing balance support: During functional activity;Bilateral upper extremity supported Standing balance-Leahy Scale: Poor Standing balance comment: pt heavily reliant on external support            Pertinent Vitals/Pain Pain Assessment: Faces Faces Pain Scale: Hurts little more Pain Location: B knees, L shoulder and hip  Pain  Descriptors / Indicators: Aching;Constant;Guarding Pain Intervention(s): Limited activity within patient's tolerance;Monitored during session;Repositioned    Home Living Family/patient expects to be discharged to:: Skilled nursing facility Living Arrangements: Alone Available  Help at Discharge: Home health Type of Home: House Home Access: Ramped entrance;Stairs to enter   [employer] of Steps: pt has a ramp but also describes 2 steps she has to get into home Home Layout: One level Home Equipment: Clinical cytogeneticist - 2 wheels;Grab bars - toilet;Grab bars - tub/shower Additional Comments: pt returned to home in September after stay in SNF, she has not left home since. She reports a fall ~ 3 weeks ago and has been bed bound for almost 3 weeks. she uses a rollator outside of home typically and a RW in home to transfer to a transport chair.    Prior Function Level of Independence: Needs assistance   Gait / Transfers Assistance Needed: Pt largely bed bound the last 3 weeks. At baseline she is able to walk a bit with rw and +1 assist.   ADL's / Homemaking Assistance Needed: has been struggling to have consistent [employer] aide. Pt reports she has largely been bed bound for the past 3 weeks. Pt reports after her last ST SNF stay she was able to walk some with therapist.  Comments: unclear when pt was last recieiving HHPT/OT? pt reports she saw them last Wednesday morning. She reports having been staying in bed for ~ 3 weeks. (Pt does not have family or friends to assist her at home, per pt report. (Pt states she has had multiple falls in the past year, has been to SNF x5 in the past.)     Hand Dominance   Dominant Hand: Right    Extremity/Trunk Assessment   Upper Extremity Assessment Upper Extremity Assessment: Defer to OT evaluation    Lower Extremity Assessment Lower Extremity Assessment: Generalized weakness    Cervical / Trunk Assessment Cervical / Trunk Assessment: (large body habitus)  Communication   Communication: No difficulties  Cognition Arousal/Alertness: Awake/alert Behavior During Therapy: Anxious Overall Cognitive Status: Within Functional Limits for tasks assessed        General Comments: Pt with significantly heightened anxiety and  fear of falling during OT/PT session. Pt verbalizing fear of falling and feeling overwhelmed by current situation. Reluctant but ultimately agreeable to sitting up in recliner.       General Comments      Exercises     Assessment/Plan    PT Assessment Patient needs continued PT services  PT Problem List Decreased strength;Decreased activity tolerance;Decreased balance;Decreased mobility;Decreased knowledge of use of DME       PT Treatment Interventions DME instruction;Functional mobility training;Balance training;Patient/family education;Gait training;Therapeutic activities;Therapeutic exercise;Stair training    PT Goals (Current goals can be found in the Care Plan section)  Acute Rehab PT Goals Patient Stated Goal: get stronger, move better, be more independent PT Goal Formulation: With patient Time For Goal Achievement: 05/25/19 Potential to Achieve Goals: Fair    Frequency Min 2X/week   Barriers to discharge Decreased caregiver support      Co-evaluation PT/OT/SLP Co-Evaluation/Treatment: Yes Reason for Co-Treatment: For patient/therapist safety;To address functional/ADL transfers PT goals addressed during session: Mobility/safety with mobility;Balance OT goals addressed during session: ADL's and self-care       AM-PAC PT "6 Clicks" Mobility  Outcome Measure Help needed turning from your back to your side while in a flat bed without using bedrails?: A Little Help needed moving from lying on your back  to sitting on the side of a flat bed without using bedrails?: A Little Help needed moving to and from a bed to a chair (including a wheelchair)?: Total Help needed standing up from a chair using your arms (e.g., wheelchair or bedside chair)?: A Lot Help needed to walk in hospital room?: Total Help needed climbing 3-5 steps with a railing? : Total 6 Click Score: 11    End of Session Equipment Utilized During Treatment: Gait belt Activity Tolerance: Patient tolerated  treatment well Patient left: in chair;with call bell/phone within reach;with chair alarm set Nurse Communication: Mobility status;Need for lift equipment PT Visit Diagnosis: Unsteadiness on feet (R26.81);Muscle weakness (generalized) (M62.81);History of falling (Z91.81);Difficulty in walking, not elsewhere classified (R26.2)    Time: [id number] PT Time Calculation (min) (ACUTE ONLY): 64 min   Charges:   PT Evaluation $PT Eval Moderate Complexity: 1 Mod PT Treatments $Therapeutic Activity: 8-22 mins        Gwynneth Albright PT, DPT Physical Therapist with [facility]  05/11/2019 5:21 PM

## 2019-05-11 NOTE — Plan of Care (Signed)
  Problem: Education: Goal: Knowledge of General Education information will improve Description: Including pain rating scale, medication(s)/side effects and non-pharmacologic comfort measures Outcome: Progressing   Problem: Coping: Goal: Level of anxiety will decrease Outcome: Progressing   Problem: Pain Managment: Goal: General experience of comfort will improve Outcome: Progressing   

## 2019-05-11 NOTE — Evaluation (Signed)
Occupational Therapy Evaluation Patient Details Name: [name] MRN: [id number] DOB: 03/15/1941 Today's Date: 05/11/2019    History of Present Illness 78 year old female with PMH of uterine cancer s/p radiation, osteoporosis, depression, chronic back pain, breast cancer, anxiety admitted with constipation, difficulty urinating and lower extremity weakness.Patient states that she is having difficulty in ambulation for the last few days and the condition continue to worsen.   Clinical Impression   Pt admitted with the above diagnoses and presents with below problem list. Pt will benefit from continued acute OT to address the below listed deficits and maximize independence with basic ADLs prior to d/c to next venue. PTA pt reports she is from home alone and has been struggling with basic ADLs. Pt with difficulty arranging [place] aide assist and has been largely bed bound the last 3 weeks. Pt reports prior to that she could stand and walk a short distance with therapy. Pt is currently max A with UB ADLs, max-total A +2 with LB ADLs. Utilized sara stedy for transfer OOB and into recliner. Pt with heightened anxiety during transfer.      Follow Up Recommendations  SNF    Equipment Recommendations  Other (comment)(defer to next venue)    Recommendations for Other Services       Precautions / Restrictions Precautions Precautions: Fall Restrictions Weight Bearing Restrictions: No      Mobility Bed Mobility Overal bed mobility: Needs Assistance Bed Mobility: Supine to Sit     Supine to sit: Mod assist;HOB elevated     General bed mobility comments: With extra time and effort pt able to completed inital bed mobility transitions of walking B feet off EOB and partially powering up trunk. HOB elevated to match setup at home (has adjustable bed). assist to powerup and pivot hips to full EOB position.   Transfers Overall transfer level: Needs assistance   Transfers: Sit to/from  Stand Sit to Stand: Max assist;+2 physical assistance;From elevated surface         General transfer comment: Initially tried standing from elevated EOB with +2 assist and rw but pt ultimately unable to clear hips from mattress. Ultized sara stedy and although pt was very anxious about this was able to be coached through transistions to sit up in recliner. Pt hooking BUE onto stedy bar to faciliatate powerup. She reports he walker at home has a similar function and she finds it reassuring and helpful.     Balance Overall balance assessment: Needs assistance Sitting-balance support: Bilateral upper extremity supported;Feet supported Sitting balance-Leahy Scale: Fair Sitting balance - Comments: sits with BUE support                                   ADL either performed or assessed with clinical judgement   ADL Overall ADL's : Needs assistance/impaired Eating/Feeding: Set up;Sitting   Grooming: Set up;Minimal assistance;Sitting   Upper Body Bathing: Sitting;Maximal assistance   Lower Body Bathing: Maximal assistance;Total assistance;+2 for physical assistance;Sitting/lateral leans;Sit to/from stand Lower Body Bathing Details (indicate cue type and reason): sara stedy Upper Body Dressing : Maximal assistance;Sitting   Lower Body Dressing: Maximal assistance;Total assistance;+2 for physical assistance;Sitting/lateral leans;Sit to/from stand Lower Body Dressing Details (indicate cue type and reason): sara stedy               General ADL Comments: Completed bed mobility, sat EOB a few minutes. Attempted to sanding with +2 assist and rw  but ultimately utilized sara stedy as pt unable to clear hips. Pt hooking BUE over bar at times during powerup to standing.      Vision         Perception     Praxis      Pertinent Vitals/Pain Pain Assessment: Faces Faces Pain Scale: Hurts little more Pain Location: B knees, L shoulder and hip  Pain Descriptors / Indicators:  Aching;Constant;Guarding Pain Intervention(s): Limited activity within patient's tolerance;Monitored during session;Repositioned     Hand Dominance Right   Extremity/Trunk Assessment Upper Extremity Assessment Upper Extremity Assessment: Generalized weakness;LUE deficits/detail LUE Deficits / Details: baseline shoulder flexion/ABD to about 80*   Lower Extremity Assessment Lower Extremity Assessment: Defer to PT evaluation   Cervical / Trunk Assessment Cervical / Trunk Assessment: Other exceptions(large body habitus)   Communication Communication Communication: No difficulties   Cognition Arousal/Alertness: Awake/alert Behavior During Therapy: Anxious Overall Cognitive Status: Within Functional Limits for tasks assessed                                 General Comments: Pt with significantly heightened anxiety and fear of falling during OT/PT session. Pt verbalizing fear of falling and feeling overwhelmed by current situation. Reluctant but ultimately agreeable to sitting up in recliner.    General Comments       Exercises     Shoulder Instructions      Home Living Family/patient expects to be discharged to:: Skilled nursing facility Living Arrangements: Alone Available Help at Discharge: Home health Type of Home: House Home Access: Ramped entrance;Stairs to enter Entrance Stairs-Number of Steps: or 2 steps but has ramp at each   Home Layout: One level     Bathroom Shower/Tub: Astronomer Accessibility: Yes   Home Equipment: Clinical cytogeneticist - 2 wheels;Grab bars - toilet;Grab bars - tub/shower   Additional Comments: hasnt' been out of home since sept when home from rehab uses rollator out and RW wtih tray in home, 3 week since not being able to walk and being in bed.      Prior Functioning/Environment Level of Independence: Needs assistance  Gait / Transfers Assistance Needed: Pt largely bed bound the last 3 weeks. At baseline she  is able to walk a bit with rw and +1 assist.  ADL's / Homemaking Assistance Needed: has been struggling to have consistent [place] aide. Pt reports she has largely been bed bound for the past 3 weeks. Pt reports after her last [facility] SNF stay she was able to walk some with therapist.   Comments: HHPT? saw last wednesday morning, had been staying in bed (Pt does not have family or friends to assist her at home, per pt report. (Pt states she has had multiple falls in the past year, has been to SNF x5 in the past.)        OT Problem List: Decreased strength;Decreased activity tolerance;Impaired balance (sitting and/or standing);Decreased knowledge of use of DME or AE;Decreased knowledge of precautions;Obesity;Pain      OT Treatment/Interventions: Self-care/ADL training;Therapeutic exercise;Energy conservation;DME and/or AE instruction;Therapeutic activities;Patient/family education;Balance training    OT Goals(Current goals can be found in the care plan section) Acute Rehab OT Goals Patient Stated Goal: get stronger, move better, be more independent OT Goal Formulation: With patient Time For Goal Achievement: 05/25/19 Potential to Achieve Goals: Good ADL Goals Pt Will Perform Grooming: with min assist;sitting Pt Will Perform Upper Body Bathing:  with min assist;sitting Pt Will Perform Lower Body Bathing: sitting/lateral leans;sit to/from stand;with mod assist Pt Will Transfer to Toilet: with mod assist;with +2 assist;bedside commode;squat pivot transfer;stand pivot transfer Pt/caregiver will Perform Home Exercise Program: Increased strength;Both right and left upper extremity;With Supervision;With written HEP provided  OT Frequency: Min 2X/week   Barriers to D/C:            Co-evaluation PT/OT/SLP Co-Evaluation/Treatment: Yes Reason for Co-Treatment: For patient/therapist safety;To address functional/ADL transfers   OT goals addressed during session: ADL's and self-care      AM-PAC OT "6  Clicks" Daily Activity     Outcome Measure Help from another person eating meals?: None Help from another person taking care of personal grooming?: A Lot Help from another person toileting, which includes using toliet, bedpan, or urinal?: Total Help from another person bathing (including washing, rinsing, drying)?: Total Help from another person to put on and taking off regular upper body clothing?: A Lot Help from another person to put on and taking off regular lower body clothing?: Total 6 Click Score: 11   End of Session Equipment Utilized During Treatment: Gait belt;Rolling walker;Other (comment)(sara stedy) Nurse Communication: Mobility status;Other (comment);Need for lift equipment;Precautions(heightened anxiety)  Activity Tolerance: Other (comment);Patient limited by fatigue;Patient tolerated treatment well(very anxious about transfer) Patient left: in chair;with call bell/phone within reach;with chair alarm set  OT Visit Diagnosis: Unsteadiness on feet (R26.81);Muscle weakness (generalized) (M62.81);Other abnormalities of gait and mobility (R26.89);Pain                Time: [id number] OT Time Calculation (min): 60 min Charges:  OT General Charges $OT Visit: 1 Visit OT Evaluation $OT Eval Moderate Complexity: 1 Mod OT Treatments $Self Care/Home Management : 8-22 mins  Tyrone Schimke, OT Acute Rehabilitation Services Pager: [phone] Office: [phone]   Hortencia Pilar 05/11/2019, 2:13 PM

## 2019-05-11 NOTE — NC FL2 (Signed)
[place] MEDICAID FL2 LEVEL OF CARE SCREENING TOOL     IDENTIFICATION  Patient Name: [name] Birthdate: 1941/05/19 Sex: female Admission Date (Current Location): 05/09/2019  [facility] and [place] Number:  Herbalist and Address:  [facility],  [place], [place]      Provider Number: [id number]  Attending Physician Name and Address:  Lucky Cowboy, MD  Relative Name and Phone Number:     [facility] [address] Pandora Leiter   [phone]       Current Level of Care: Hospital Recommended Level of Care: [place] Prior Approval Number:    Date Approved/Denied:   PASRR Number: [id number]  Discharge Plan: SNF    Current Diagnoses: Patient Active Problem List   Diagnosis Date Noted  . UTI (urinary tract infection) 05/11/2019  . Acute cystitis 05/10/2019  . CKD (chronic kidney disease), stage III 05/10/2019  . Weakness 05/09/2019  . Pulmonary embolus ([place])   . Pulmonary embolus, left ([place])   . Pulmonary embolus, right ([place])   . CKD (chronic kidney disease), stage I   . Bilateral pulmonary embolism ([place])   . PE (pulmonary embolism)   . Acute kidney injury superimposed on chronic kidney disease ([place]) 11/30/2015  . Depression 11/30/2015  . Essential hypertension 11/30/2015  . Acute respiratory failure with hypoxia ([place]) 11/30/2015  . Diabetes mellitus type 2 in obese ([place]) 11/30/2015  . Pulmonary embolism ([place]) 11/29/2015  . Pulmonary embolism, bilateral ([place]) 11/29/2015  . Pain in lower limb 01/10/2015  . Malignant neoplasm of upper-outer quadrant of left breast in female, estrogen receptor positive ([place]) 01/24/2013  . Edema 01/09/2013  . Onychomycosis 01/09/2013  . Pain in joint, ankle and foot 01/09/2013    Orientation RESPIRATION BLADDER Height & Weight     Self, Time, Place, Situation  Normal Incontinent Weight: 285 lb 0.9 oz (129.3 kg) Height:  5\' 6"  (167.6 cm)  BEHAVIORAL SYMPTOMS/MOOD NEUROLOGICAL BOWEL  NUTRITION STATUS      Incontinent Diet  AMBULATORY STATUS COMMUNICATION OF NEEDS Skin   Extensive Assist Verbally Normal                       Personal Care Assistance Level of Assistance  Bathing, Feeding, Dressing Bathing Assistance: Limited assistance Feeding assistance: Independent Dressing Assistance: Limited assistance     Functional Limitations Info  Sight, Hearing, Speech Sight Info: Adequate Hearing Info: Adequate Speech Info: Adequate    SPECIAL CARE FACTORS FREQUENCY  PT (By licensed PT), OT (By licensed OT)     PT Frequency: 5x/week OT Frequency: 5x/week            Contractures Contractures Info: Not present    Additional Factors Info  Psychotropic Code Status Info: Fullcode Allergies Info: Allergies: Iodine Psychotropic Info: Wellbutrin, Cymbalta         Current Medications (05/11/2019):  This is the current hospital active medication list Current Facility-Administered Medications  Medication Dose Route Frequency Provider Last Rate Last Admin  . acetaminophen (TYLENOL) tablet 650 mg  650 mg Oral Q6H PRN Bunnie Pion Z, DO       Or  . acetaminophen (TYLENOL) suppository 650 mg  650 mg Rectal Q6H PRN Bunnie Pion Z, DO      . apixaban Arne [place]) tablet 5 mg  5 mg Oral BID Bunnie Pion Z, DO   5 mg at 05/11/19 0931  . buPROPion (WELLBUTRIN SR) 12 hr tablet 150 mg  150 mg Oral BID Posey Pronto,  Michaele Offer, MD   150 mg at 05/11/19 0931  . carisoprodol (SOMA) tablet 350 mg  350 mg Oral BID Lucky Cowboy, MD   350 mg at 05/11/19 0931  . Chlorhexidine Gluconate Cloth 2 % PADS 6 each  6 each Topical Daily Lucky Cowboy, MD      . docusate sodium (COLACE) capsule 100 mg  100 mg Oral Daily Lucky Cowboy, MD   100 mg at 05/11/19 0931  . DULoxetine (CYMBALTA) DR capsule 20 mg  20 mg Oral Daily Lucky Cowboy, MD   20 mg at 05/11/19 0931  . exemestane (AROMASIN) tablet 25 mg  25 mg Oral Daily Lucky Cowboy, MD   25 mg at 05/11/19 0931  . fenofibrate tablet 160  mg  160 mg Oral Daily Lucky Cowboy, MD   160 mg at 05/11/19 0931  . gabapentin (NEURONTIN) capsule 300 mg  300 mg Oral QHS Lucky Cowboy, MD   300 mg at 05/10/19 2114  . metoprolol succinate (TOPROL-XL) 24 hr tablet 50 mg  50 mg Oral BID Lucky Cowboy, MD   50 mg at 05/11/19 0931  . ondansetron (ZOFRAN) tablet 4 mg  4 mg Oral Q6H PRN Bunnie Pion Z, DO       Or  . ondansetron [facility]) injection 4 mg  4 mg Intravenous Q6H PRN Bunnie Pion Z, DO      . polyethylene glycol (MIRALAX / GLYCOLAX) packet 17 g  17 g Oral Daily Lucky Cowboy, MD   17 g at 05/11/19 0931  . senna (SENOKOT) tablet 8.6 mg  1 tablet Oral Daily Lucky Cowboy, MD   8.6 mg at 05/11/19 0931  . spironolactone (ALDACTONE) tablet 50 mg  50 mg Oral Barbaraann Barthel, MD   50 mg at 05/11/19 [id number]     Discharge Medications: Please see discharge summary for a list of discharge medications.  Relevant Imaging Results:  Relevant Lab Results:   Additional Information [id number]  Lia Hopping, LCSW

## 2019-05-11 NOTE — TOC Initial Note (Addendum)
Transition of Care [facility]) - Initial/Assessment Note    Patient Details  Name: [name] MRN: [id number] Date of Birth: 10/22/1940  [facility]) CM/SW Contact:    Lia Hopping, [place] Phone Number: 05/11/2019, 9:54 AM  Clinical Narrative:  Patient admitted for generalized weakness and difficult to urinate.                 CSW met with the patient at bedside. Patient agreeable to talk with CSW. Patient reports she lives at home alone. Patient has a son that does not live in the area. Patient states, "I have treated myself badly, this is my fault." Patient references her inability to take care of herself. Patient reports she has not had a bowel movement in three weeks and has difficulty with urinating. Patient reports she had a fall about three weeks ago and has not been able to leave the bed.Patient reports she was able to ambulate with a rolling walker at times before her fall. She primarily used a transfer chair because her doorways are too small for a wheelchair. Patient reports she pays for personal care services two hours in the morning and evening, twice a week. They assist with all her ADLs. Patient reports she wears depends and bathes herself with a cloth when they are not there. Patient reports her neighbors also assist with her care. Patient is agreeable to [place] placement in hopes to eventually transition to an [place].   CSW received notice from [facility] the patient was active with their services. The patient was discharge from there services for non-compliance and unsafe home situation.   CSW received a call from [facility] [employer], Case worker Samule Ohm [phone]. There has been open case since the beginning of December. She reports the patient friend has been working to find ALF placement for the patient. Case Worker has requested to be updated with patient progression and discharge plans.    Expected  Discharge Plan: Skilled Nursing Facility Barriers to Discharge: Continued Medical Work up, Ship broker   Patient Goals and CMS Choice   CMS Medicare.gov Compare Post Acute Care list provided to:: Patient Choice offered to / list presented to : Patient  Expected Discharge Plan and Services Expected Discharge Plan: [place] In-house Referral: Clinical Social Work Discharge Planning Services: CM Consult Post Acute Care Choice: [place] Living arrangements for the past 2 months: [place], [place]                                      Prior Living Arrangements/Services Living arrangements for the past 2 months: [place], [place] Lives with:: Self Patient language and need for interpreter reviewed:: No Do you feel safe going back to the place where you live?: Yes      Need for Family Participation in Patient Care: Yes (Comment) Care giver support system in place?: No (comment) Current home services: DME, Homehealth aide Criminal Activity/Legal Involvement Pertinent to Current Situation/Hospitalization: No - Comment as needed  Activities of Daily Living Home Assistive Devices/Equipment: Gilford Rile (specify type), Wheelchair ADL Screening (condition at time of admission) Patient's cognitive ability adequate to safely complete daily activities?: Yes Is the patient deaf or have difficulty hearing?: No Does the patient have difficulty seeing, even when wearing glasses/contacts?: No Does the patient have difficulty concentrating, remembering,  or making decisions?: No Patient able to express need for assistance with ADLs?: Yes Does the patient have difficulty dressing or bathing?: Yes Independently performs ADLs?: No Communication: Needs assistance Is this a change from baseline?: Change from baseline, expected to last >3 days Dressing (OT): Needs assistance Grooming: Needs  assistance Feeding: Needs assistance Bathing: Needs assistance Toileting: Needs assistance In/Out Bed: Needs assistance Does the patient have difficulty walking or climbing stairs?: Yes Weakness of Legs: Both Weakness of Arms/Hands: None  Permission Sought/Granted Permission sought to share information with : Family Supports Permission granted to share information with : Yes, Verbal Permission Granted  Share Information with NAME: Nalda, [address] Pandora Leiter   [phone]  Permission granted to share info w AGENCY: SNF' in the area  Permission granted to share info w Relationship: Son  Permission granted to share info w Contact Information: [phone]  Emotional Assessment Appearance:: Appears stated age Attitude/Demeanor/Rapport: Engaged Affect (typically observed): Accepting, Calm Orientation: : Oriented to Self, Oriented to Place, Oriented to  Time, Oriented to Situation Alcohol / Substance Use: Not Applicable Psych Involvement: No (comment)  Admission diagnosis:  Fecal impaction (HCC) [K56.41] Weakness generalized [R53.1] Weakness [R53.1] Acute cystitis without hematuria [N30.00] Constipation, unspecified constipation type [K59.00] UTI (urinary tract infection) [N39.0] Patient Active Problem List   Diagnosis Date Noted  . UTI (urinary tract infection) 05/11/2019  . Acute cystitis 05/10/2019  . CKD (chronic kidney disease), stage III 05/10/2019  . Weakness 05/09/2019  . Pulmonary embolus ([place])   . Pulmonary embolus, left ([place])   . Pulmonary embolus, right ([place])   . CKD (chronic kidney disease), stage I   . Bilateral pulmonary embolism ([place])   . PE (pulmonary embolism)   . Acute kidney injury superimposed on chronic kidney disease ([place]) 11/30/2015  . Depression 11/30/2015  . Essential hypertension 11/30/2015  . Acute respiratory failure with hypoxia ([place]) 11/30/2015  . Diabetes mellitus type 2 in obese ([place]) 11/30/2015  . Pulmonary embolism ([place]) 11/29/2015  . Pulmonary  embolism, bilateral ([place]) 11/29/2015  . Pain in lower limb 01/10/2015  . Malignant neoplasm of upper-outer quadrant of left breast in female, estrogen receptor positive ([place]) 01/24/2013  . Edema 01/09/2013  . Onychomycosis 01/09/2013  . Pain in joint, ankle and foot 01/09/2013   PCP:  Willey Blade, MD Pharmacy:   [facility] [address] [place]), [place] [address] [place] [address] Phone: [phone] Fax: [phone]  [facility] [place], [place] [address] [place] [address] Phone: [phone] Fax: [phone]     Social Determinants of Health (SDOH) Interventions    Readmission Risk Interventions No flowsheet data found.

## 2019-05-12 LAB — CBC
HCT: 43.9 % (ref 36.0–46.0)
Hemoglobin: 13.1 g/dL (ref 12.0–15.0)
MCH: 26.7 pg (ref 26.0–34.0)
MCHC: 29.8 g/dL — ABNORMAL LOW (ref 30.0–36.0)
MCV: 89.4 fL (ref 80.0–100.0)
Platelets: 291 10*3/uL (ref 150–400)
RBC: 4.91 MIL/uL (ref 3.87–5.11)
RDW: 14.6 % (ref 11.5–15.5)
WBC: 10 10*3/uL (ref 4.0–10.5)
nRBC: 0 % (ref 0.0–0.2)

## 2019-05-12 MED ORDER — SODIUM CHLORIDE 0.9 % IV SOLN
INTRAVENOUS | Status: DC | PRN
  Administered 2019-05-12 – 2019-05-13 (×2): 250 mL via INTRAVENOUS

## 2019-05-12 NOTE — Progress Notes (Signed)
PROGRESS NOTE    [name]  [id number] DOB: 1940/09/03 DOA: 05/09/2019 PCP: Willey Blade, MD   Brief Narrative:  78 year old female with PMH of uterine cancer s/p radiation, osteoporosis, depression, chronic back pain, breast cancer, anxiety admitted with constipation, difficulty urinating and lower extremity weakness.  Assessment & Plan:   Principal Problem:   Acute cystitis Active Problems:   Pulmonary embolism (HCC)   Weakness   CKD (chronic kidney disease), stage III   UTI (urinary tract infection)   Constipation  E.coli UTI Urine culture growing pan sensitive Ecoli Continue ceftriaxone Remove Foley catheter.  Voiding trial given she had urinary retention on presentation  Severe constipation S/p enema: 1 dose of magnesium citrate, polyethylene glycol Encouraged increased water intake and high fibre diet  Senna, Colace, MiraLAX scheduled  Generalized weakness With more pronounced lower extremity weakness. Suspect this is in setting of deconditioning  Patient is a poor historian.  Notes she has had lower extremity weakness for weeks now due to lack of rehab. Physical therapy, OT  Sinus tachycardia Resolved ?sepsis   CKD stage IIIb Creatinine at baseline  Renally dose medications   History of pulmonary embolism Continue Eliquis  Depression/anxiety Continue Cymbalta  Hypertension Continue home meds   DVT prophylaxis: [id number] Code Status: Full code  Family Communication:  Disposition Plan: PT recommends SNF, patient agreeable to this. Tentative plan for discharge on Monday to SNF. covid screening test to be ordered on Sunday (12/20).  Pending placement.  Consultants:   None  Procedures:  None  Antimicrobials:   Ceftriaxone   Subjective: Patient seen and examined.  No acute events overnight.  Had a good bowel movement today denies noticing it.  Says she cannot feel when she needs to pass urine or stool at home.  Denies any  other complaints.  She is okay going to a nursing home.  Objective: Vitals:   05/11/19 2246 05/12/19 0613 05/12/19 0859 05/12/19 1338  BP: 128/78 130/73 120/81 126/66  Pulse: 85 91 91 90  Resp: 18 18  17   Temp: 98.2 F (36.8 C) 98.4 F (36.9 C)  97.9 F (36.6 C)  TempSrc: Oral     SpO2: 96% 95%  96%  Weight:      Height:        Intake/Output Summary (Last 24 hours) at 05/12/2019 1744 Last data filed at 05/12/2019 1542 Gross per 24 hour  Intake 864.98 ml  Output 1200 ml  Net -335.02 ml   Filed Weights   05/10/19 0623  Weight: 129.3 kg    Examination:  General exam: Appears calm and comfortable Respiratory system: Clear to auscultation. Respiratory effort normal. Cardiovascular system: S1 & S2 heard, RRR. No JVD.  Pedal edema present Gastrointestinal system: Abdomen is nondistended, soft and nontender. Normal bowel sounds heard. Central nervous system: Alert and oriented.  Bilateral upper extremity strength intact but she denies able to abduct overhead. Extremities: She is barely able to lift her bilateral lower extremities against gravity (unclear if this is real weakness or lack of effort and deconditioning) (ongoing for several weeks per patient).  Skin: No rashes, lesions or ulcers Psychiatry: Flat affect   Data Reviewed: I have personally reviewed following labs and imaging studies  CBC: Recent Labs  Lab 05/09/19 1918 05/10/19 0650 05/12/19 0148  WBC 8.4 12.0* 10.0  NEUTROABS 5.8  --   --   HGB 14.9 14.6 13.1  HCT 49.6* 47.7* 43.9  MCV 90.5 89.8 89.4  PLT 319 307 291  Basic Metabolic Panel: Recent Labs  Lab 05/09/19 1918 05/10/19 0650  NA 134* 133*  K 4.5 4.1  CL 100 100  CO2 22 21*  GLUCOSE 114* 125*  BUN 19 22  CREATININE 1.13* 1.36*  CALCIUM 10.0 9.9   GFR: Estimated Creatinine Clearance: 48.5 mL/min (A) (by C-G formula based on SCr of 1.36 mg/dL (H)). Liver Function Tests: Recent Labs  Lab 05/09/19 1918 05/10/19 0650  AST 17 21   ALT 15 17  ALKPHOS 88 94  BILITOT 0.6 0.8  PROT 7.7 7.4  ALBUMIN 3.7 3.7   Recent Labs  Lab 05/09/19 1918  LIPASE 34   No results for input(s): AMMONIA in the last 168 hours. Coagulation Profile: No results for input(s): INR, PROTIME in the last 168 hours. Cardiac Enzymes: No results for input(s): CKTOTAL, CKMB, CKMBINDEX, TROPONINI in the last 168 hours. BNP (last 3 results) No results for input(s): PROBNP in the last 8760 hours. HbA1C: No results for input(s): HGBA1C in the last 72 hours. CBG: No results for input(s): GLUCAP in the last 168 hours. Lipid Profile: No results for input(s): CHOL, HDL, LDLCALC, TRIG, CHOLHDL, LDLDIRECT in the last 72 hours. Thyroid Function Tests: No results for input(s): TSH, T4TOTAL, FREET4, T3FREE, THYROIDAB in the last 72 hours. Anemia Panel: No results for input(s): VITAMINB12, FOLATE, FERRITIN, TIBC, IRON, RETICCTPCT in the last 72 hours. Sepsis Labs: No results for input(s): PROCALCITON, LATICACIDVEN in the last 168 hours.  Recent Results (from the past 240 hour(s))  Urine culture     Status: Abnormal   Collection Time: 05/09/19  9:39 PM   Specimen: Urine, Catheterized  Result Value Ref Range Status   Specimen Description   Final    Urine Performed at [facility] [address] [place], [place] [address]    Special Requests   Final    NONE Performed at [facility], [place] [address], [place], [place] [address]    Culture >=100,000 COLONIES/mL ESCHERICHIA COLI (A)  Final   Report Status 05/11/2019 FINAL  Final   Organism ID, Bacteria ESCHERICHIA COLI (A)  Final      Susceptibility   Escherichia coli - MIC*    AMPICILLIN <=2 SENSITIVE Sensitive     CEFAZOLIN <=4 SENSITIVE Sensitive     CEFEPIME <=1 SENSITIVE Sensitive     CEFTAZIDIME <=1 SENSITIVE Sensitive     CEFTRIAXONE <=1 SENSITIVE Sensitive     CIPROFLOXACIN <=0.25 SENSITIVE Sensitive     GENTAMICIN <=1 SENSITIVE Sensitive      IMIPENEM <=0.25 SENSITIVE Sensitive     TRIMETH/SULFA <=20 SENSITIVE Sensitive     AMPICILLIN/SULBACTAM <=2 SENSITIVE Sensitive     PIP/TAZO <=4 SENSITIVE Sensitive     * >=100,000 COLONIES/mL ESCHERICHIA COLI  SARS CORONAVIRUS 2 (TAT 6-24 HRS) Nasopharyngeal Nasopharyngeal Swab     Status: None   Collection Time: 05/10/19  1:55 AM   Specimen: Nasopharyngeal Swab  Result Value Ref Range Status   SARS Coronavirus 2 NEGATIVE NEGATIVE Final    Comment: (NOTE) SARS-CoV-2 target nucleic acids are NOT DETECTED. The SARS-CoV-2 RNA is generally detectable in upper and lower respiratory specimens during the acute phase of infection. Negative results do not preclude SARS-CoV-2 infection, do not rule out co-infections with other pathogens, and should not be used as the sole basis for treatment or other patient management decisions. Negative results must be combined with clinical observations, patient history, and epidemiological information. The expected result is Negative. Fact Sheet for Patients: SugarRoll.be Fact  Sheet for Healthcare Providers: https://www.woods-mathews.com/ This test is not yet approved or cleared by the [place] FDA and  has been authorized for detection and/or diagnosis of SARS-CoV-2 by FDA under an Emergency Use Authorization (EUA). This EUA will remain  in effect (meaning this test can be used) for the duration of the COVID-19 declaration under Section 56 4(b)(1) of the Act, 21 U.S.C. section 360bbb-3(b)(1), unless the authorization is terminated or revoked sooner. Performed at [facility], [place] [address] [place], [place] [address]          Radiology Studies: No results found.      Scheduled Meds: . apixaban  5 mg Oral BID  . buPROPion  150 mg Oral BID  . carisoprodol  350 mg Oral BID  . Chlorhexidine Gluconate Cloth  6 each Topical Daily  . docusate sodium  100 mg Oral Daily  . DULoxetine  20 mg  Oral Daily  . exemestane  25 mg Oral Daily  . fenofibrate  160 mg Oral Daily  . gabapentin  300 mg Oral QHS  . metoprolol succinate  50 mg Oral BID  . polyethylene glycol  17 g Oral Daily  . polyethylene glycol-electrolytes  2,000 mL Oral Once  . senna  1 tablet Oral Daily  . spironolactone  50 mg Oral BH-q7a   Continuous Infusions: . sodium chloride Stopped (05/12/19 1542)  . cefTRIAXone (ROCEPHIN)  IV Stopped (05/12/19 1525)     LOS: 1 day    Time spent: Spent more than 30 minutes in coordinating care for this patient including bedside patient care.    Lucky Cowboy, MD [facility] If 7PM-7AM, please contact night-coverage 05/12/2019, 5:44 PM

## 2019-05-12 NOTE — Progress Notes (Addendum)
Budd Palmer, MD notified regarding +1 bilateral LE edema. Socks removed.  Upon reassessment, edema decreased to non-pitting after removing socks. MD aware. Will CTM.

## 2019-05-12 NOTE — Progress Notes (Signed)
Pt had two large loose BMs today (type 6). Pt denied initial BM. Both BMs witnessed by this nurse and NT, Elmyra Ricks.

## 2019-05-12 NOTE — Progress Notes (Signed)
Verbal orders given by Budd Palmer, MD to remove foley cath.

## 2019-05-13 DIAGNOSIS — N1832 Chronic kidney disease, stage 3b: Secondary | ICD-10-CM

## 2019-05-13 DIAGNOSIS — K5641 Fecal impaction: Secondary | ICD-10-CM

## 2019-05-13 DIAGNOSIS — N3000 Acute cystitis without hematuria: Principal | ICD-10-CM

## 2019-05-13 LAB — SARS CORONAVIRUS 2 (TAT 6-24 HRS): SARS Coronavirus 2: NEGATIVE

## 2019-05-13 NOTE — TOC Progression Note (Signed)
Transition of Care [facility]) - Progression Note    Patient Details  Name: [name] MRN: [id number] Date of Birth: Aug 24, 1940  Transition of [facility]) CM/SW Contact  Servando Snare, [place] Phone Number: 05/13/2019, 10:31 AM  Clinical Narrative:   LCSW presented patient with bed offers. Patient chose [facility]. Patient will need UHC auth that will be started on Monday.     Expected Discharge Plan: [place] Barriers to Discharge: Continued Medical Work up, Orthoptist and Services Expected Discharge Plan: [place] In-house Referral: Clinical Social Work Discharge Planning Services: CM Consult Post Acute Care Choice: [place] arrangements for the past 2 months: [place], Single Family Home                                       Social Determinants of Health (SDOH) Interventions    Readmission Risk Interventions No flowsheet data found.

## 2019-05-13 NOTE — Progress Notes (Addendum)
PROGRESS NOTE    [name]  [id number] DOB: 05/03/1941 DOA: 05/09/2019 PCP: Willey Blade, MD    Brief Narrative: 79 year old female with PMH of uterine cancer s/p radiation, osteoporosis, depression, chronic back pain, breast cancer, anxiety admitted with constipation, difficulty urinating and lower extremity weakness  Assessment & Plan:   Principal Problem:   Acute cystitis Active Problems:   Pulmonary embolism (HCC)   Weakness   CKD (chronic kidney disease), stage III   UTI (urinary tract infection)   Constipation   #1 E. coli UTI sensitive to ceftriaxone on ceftriaxone.  Patient presented with urinary retention in the setting of severe constipation.  She had Foley catheter placed which was removed 05/12/2019.  #2 severe constipation patient has been having multiple bowel movements.  She had 7 loose bowel movements will hold her senna Colace and MiraLAX scheduled.  #3 CKD stage III at baseline stable  #4 status post sinus tachycardia resolved likely secondary to UTI  #5 generalized weakness with bilateral lower extremity weakness ongoing for weeks with deconditioning-patient seen by PT and recommending SNF.  #6 history of pulmonary embolism on Eliquis  #7 hypertension stable on metoprolol and Aldactone.  Blood pressure 123/67.  #8 depression and anxiety continue Cymbalta    Estimated body mass index is 46.01 kg/m as calculated from the following:   Height as of this encounter: 5\' 6"  (1.676 m).   Weight as of this encounter: 129.3 kg.  DVT prophylaxis: [id number] Code Status: Full code  Family Communication: None  Disposition Plan:  Plan is to discharge to SNF Covid test ordered today Consultants:   None  Procedures:  None  Antimicrobials:   Ceftriaxone  Subjective: She is resting in bed she has no new complaints she is anxious to know which rehab she is going to go to.  Objective: Vitals:   05/12/19 1338 05/12/19 2212 05/13/19  0647 05/13/19 0838  BP: 126/66 138/70 118/68 127/64  Pulse: 90 93 92 90  Resp: 17 18 20    Temp: 97.9 F (36.6 C) 97.8 F (36.6 C) 98.1 F (36.7 C)   TempSrc:      SpO2: 96% 96% 92%   Weight:      Height:        Intake/Output Summary (Last 24 hours) at 05/13/2019 1003 Last data filed at 05/13/2019 0840 Gross per 24 hour  Intake 1118.39 ml  Output 200 ml  Net 918.39 ml   Filed Weights   05/10/19 0623  Weight: 129.3 kg    Examination:  General exam: Appears calm and comfortable  Respiratory system: Clear to auscultation. Respiratory effort normal. Cardiovascular system: S1 & S2 heard, RRR. No JVD, murmurs, rubs, gallops or clicks. No pedal edema. Gastrointestinal system: Abdomen is nondistended, soft and nontender. No organomegaly or masses felt. Normal bowel sounds heard. Central nervous system: Alert and oriented. No focal neurological deficits. Extremities: Trace bilateral edema  skin: No rashes, lesions or ulcers Psychiatry: Judgement and insight appear normal. Mood & affect appropriate.     Data Reviewed: I have personally reviewed following labs and imaging studies  CBC: Recent Labs  Lab 05/09/19 1918 05/10/19 0650 05/12/19 0148  WBC 8.4 12.0* 10.0  NEUTROABS 5.8  --   --   HGB 14.9 14.6 13.1  HCT 49.6* 47.7* 43.9  MCV 90.5 89.8 89.4  PLT 319 307 [id number]   Basic Metabolic Panel: Recent Labs  Lab 05/09/19 1918 05/10/19 0650  NA 134* 133*  K 4.5 4.1  CL 100 100  CO2 22 21*  GLUCOSE 114* 125*  BUN 19 22  CREATININE 1.13* 1.36*  CALCIUM 10.0 9.9   GFR: Estimated Creatinine Clearance: 48.5 mL/min (A) (by C-G formula based on SCr of 1.36 mg/dL (H)). Liver Function Tests: Recent Labs  Lab 05/09/19 1918 05/10/19 0650  AST 17 21  ALT 15 17  ALKPHOS 88 94  BILITOT 0.6 0.8  PROT 7.7 7.4  ALBUMIN 3.7 3.7   Recent Labs  Lab 05/09/19 1918  LIPASE 34   No results for input(s): AMMONIA in the last 168 hours. Coagulation Profile: No results for  input(s): INR, PROTIME in the last 168 hours. Cardiac Enzymes: No results for input(s): CKTOTAL, CKMB, CKMBINDEX, TROPONINI in the last 168 hours. BNP (last 3 results) No results for input(s): PROBNP in the last 8760 hours. HbA1C: No results for input(s): HGBA1C in the last 72 hours. CBG: No results for input(s): GLUCAP in the last 168 hours. Lipid Profile: No results for input(s): CHOL, HDL, LDLCALC, TRIG, CHOLHDL, LDLDIRECT in the last 72 hours. Thyroid Function Tests: No results for input(s): TSH, T4TOTAL, FREET4, T3FREE, THYROIDAB in the last 72 hours. Anemia Panel: No results for input(s): VITAMINB12, FOLATE, FERRITIN, TIBC, IRON, RETICCTPCT in the last 72 hours. Sepsis Labs: No results for input(s): PROCALCITON, LATICACIDVEN in the last 168 hours.  Recent Results (from the past 240 hour(s))  Urine culture     Status: Abnormal   Collection Time: 05/09/19  9:39 PM   Specimen: Urine, Catheterized  Result Value Ref Range Status   Specimen Description   Final    Urine Performed at [facility] [address], [place], [place] [address]    Special Requests   Final    NONE Performed at [facility], [place] [address] [place], [place] [address]    Culture >=100,000 COLONIES/mL ESCHERICHIA COLI (A)  Final   Report Status 05/11/2019 FINAL  Final   Organism ID, Bacteria ESCHERICHIA COLI (A)  Final      Susceptibility   Escherichia coli - MIC*    AMPICILLIN <=2 SENSITIVE Sensitive     CEFAZOLIN <=4 SENSITIVE Sensitive     CEFEPIME <=1 SENSITIVE Sensitive     CEFTAZIDIME <=1 SENSITIVE Sensitive     CEFTRIAXONE <=1 SENSITIVE Sensitive     CIPROFLOXACIN <=0.25 SENSITIVE Sensitive     GENTAMICIN <=1 SENSITIVE Sensitive     IMIPENEM <=0.25 SENSITIVE Sensitive     TRIMETH/SULFA <=20 SENSITIVE Sensitive     AMPICILLIN/SULBACTAM <=2 SENSITIVE Sensitive     PIP/TAZO <=4 SENSITIVE Sensitive     * >=100,000 COLONIES/mL ESCHERICHIA COLI  SARS  CORONAVIRUS 2 (TAT 6-24 HRS) Nasopharyngeal Nasopharyngeal Swab     Status: None   Collection Time: 05/10/19  1:55 AM   Specimen: Nasopharyngeal Swab  Result Value Ref Range Status   SARS Coronavirus 2 NEGATIVE NEGATIVE Final    Comment: (NOTE) SARS-CoV-2 target nucleic acids are NOT DETECTED. The SARS-CoV-2 RNA is generally detectable in upper and lower respiratory specimens during the acute phase of infection. Negative results do not preclude SARS-CoV-2 infection, do not rule out co-infections with other pathogens, and should not be used as the sole basis for treatment or other patient management decisions. Negative results must be combined with clinical observations, patient history, and epidemiological information. The expected result is Negative. Fact Sheet for Patients: SugarRoll.be Fact Sheet for Healthcare Providers: https://www.woods-.com/ This test is not yet approved or cleared by the [place] FDA and  has been authorized for  detection and/or diagnosis of SARS-CoV-2 by FDA under an Emergency Use Authorization (EUA). This EUA will remain  in effect (meaning this test can be used) for the duration of the COVID-19 declaration under Section 56 4(b)(1) of the Act, 21 U.S.C. section 360bbb-3(b)(1), unless the authorization is terminated or revoked sooner. Performed at [facility], [place] [address], [place], [place] [address]          Radiology Studies: No results found.      Scheduled Meds: . apixaban  5 mg Oral BID  . buPROPion  150 mg Oral BID  . carisoprodol  350 mg Oral BID  . Chlorhexidine Gluconate Cloth  6 each Topical Daily  . docusate sodium  100 mg Oral Daily  . DULoxetine  20 mg Oral Daily  . exemestane  25 mg Oral Daily  . fenofibrate  160 mg Oral Daily  . gabapentin  300 mg Oral QHS  . metoprolol succinate  50 mg Oral BID  . polyethylene glycol  17 g Oral Daily  . polyethylene  glycol-electrolytes  2,000 mL Oral Once  . senna  1 tablet Oral Daily  . spironolactone  50 mg Oral BH-q7a   Continuous Infusions: . sodium chloride Stopped (05/12/19 1542)  . cefTRIAXone (ROCEPHIN)  IV Stopped (05/12/19 1525)     LOS: 2 days        Georgette Shell, MD [facility]  If 7PM-7AM, please contact night-coverage [email or web address] Password [email or web address] 05/13/2019, 10:03 AM

## 2019-05-14 NOTE — TOC Progression Note (Signed)
Transition of Care [facility]) - Progression Note    Patient Details  Name: [name] MRN: [id number] Date of Birth: March 13, 1941  Transition [facility] CM/SW Contact  Lynnmarie Lovett, Marjie Skiff, RN Phone Number: 05/14/2019, 3:50 PM  Clinical Narrative:    Josem Kaufmann started with [facility] will have a bed available tomorrow 12/22. Last Covid test done 12/20.   Expected Discharge Plan: [place] Barriers to Discharge: Continued Medical Work up, Orthoptist and Services Expected Discharge Plan: [place] In-house Referral: Clinical Social Work Discharge Planning Services: CM Consult Post Acute Care Choice: [place] arrangements for the past 2 months: [place], Single Family Home                                       Social Determinants of Health (SDOH) Interventions    Readmission Risk Interventions No flowsheet data found.

## 2019-05-14 NOTE — Progress Notes (Signed)
Occupational Therapy Treatment Patient Details Name: [name] MRN: [id number] DOB: August 09, 1940 Today's Date: 05/14/2019    History of present illness 78 year old female with PMH of uterine cancer s/p radiation, osteoporosis, depression, chronic back pain, breast cancer, anxiety admitted with constipation, difficulty urinating and lower extremity weakness.Patient states that she is having difficulty in ambulation for the last few days and the condition continue to worsen.   OT comments  This 78 yo female seen today for Bil UE exercises with theraband level 1 and 2 at bed level due to patient just back in bed with RN staff. Patient tolerated well and instructed to continue to do exercises throughout the day but also to not over do it. She will continue to benefit from acute OT with follow up at SNF.   Follow Up Recommendations  SNF;Supervision/Assistance - 24 hour    Equipment Recommendations  Other (comment)(TBD next venue)       Precautions / Restrictions Precautions Precautions: Fall Restrictions Weight Bearing Restrictions: No       Mobility Bed Mobility Overal bed mobility: Needs Assistance Bed Mobility: Supine to Sit     Supine to sit: Mod assist;HOB elevated;+2 for safety/equipment     General bed mobility comments: pt requried assist to walk bil LE's to EOB and verbal/tactile cues for reachign Rt UE to grab bar of bed to assist with pivot to Lt side. mod assist +2 required for completing pivot bring LE's to EOB and raise trunk upright, verbal/tactile cues for reaching Rt UE to grab bar for support in sitting.  Transfers Overall transfer level: Needs assistance   Transfers: Sit to/from Stand Sit to Stand: From elevated surface;Mod assist;+2 safety/equipment;+2 physical assistance         General transfer comment: pt performed 2x mini-stand from EOB prior to complete rise to stand and bring Stedy paddles back for seated rest. 5x sit<>stand performed  with  5-15seconds of standing during each and extended 1-2 minutes seated rest between. Pt performed ~ 50minutes of standing on last attempt for BM clean up. Final sit<>stand performed from [facility] to then sit in bedside recliner at EOS, chair slightly lower and pt reporting some discomfort in back and requiring increased assistance for controlled lower.    Balance Overall balance assessment: Needs assistance Sitting-balance support: Bilateral upper extremity supported;Feet supported Sitting balance-Leahy Scale: Fair Sitting balance - Comments: sits with BUE support   Standing balance support: During functional activity;Bilateral upper extremity supported Standing balance-Leahy Scale: Poor Standing balance comment: pt heavily reliant on external support                                  Vision Patient Visual Report: No change from baseline            Cognition Arousal/Alertness: Awake/alert Behavior During Therapy: WFL for tasks assessed/performed Overall Cognitive Status: Within Functional Limits for tasks assessed                                 General Comments: pt continues to be anxious with mobility but is agreeable to perform all bed mob and transfers for therpay session        Exercises Other Exercises Other Exercises: Provided pt with theraband (level one on left and level two on right). Educated pt on 3 exercises. With bottom rail theraband she is to do (bend elbow--bicep curl).  With top rail therband she is do do (push towards feet and reach across to grab opposite rail theraband and then pull it back--horizontal abduction). She did 10 reps of each one with me in 5 rep increments and holding for a count of 5 (counting outloud so she was not holdign her breath)           Pertinent Vitals/ Pain       Pain Assessment: No/denies pain                                                          Frequency  Min 2X/week         Progress Toward Goals  OT Goals(current goals can now be found in the care plan section)  Progress towards OT goals: Progressing toward goals  Acute Rehab OT Goals Patient Stated Goal: get stronger, move better, be more independent  Plan Discharge plan remains appropriate                     AM-PAC OT "6 Clicks" Daily Activity     Outcome Measure   Help from another person eating meals?: None Help from another person taking care of personal grooming?: A Little Help from another person toileting, which includes using toliet, bedpan, or urinal?: Total Help from another person bathing (including washing, rinsing, drying)?: A Lot Help from another person to put on and taking off regular upper body clothing?: Total Help from another person to put on and taking off regular lower body clothing?: Total 6 Click Score: 12    End of Session    OT Visit Diagnosis: Unsteadiness on feet (R26.81);Muscle weakness (generalized) (M62.81);Other abnormalities of gait and mobility (R26.89)   Activity Tolerance Patient tolerated treatment well   Patient Left in bed;with call bell/phone within reach;with bed alarm set   Nurse Communication Patient requests pain meds        Time: [id number] OT Time Calculation (min): 30 min  Charges: OT General Charges $OT Visit: 1 Visit OT Treatments $Therapeutic Exercise: 23-37 mins  Cathy  OTR/L Acute [employer] Pager [phone] Office [phone]      05/14/2019, 4:18 PM

## 2019-05-14 NOTE — Progress Notes (Signed)
PROGRESS NOTE    [name]  [id number] DOB: 1940-07-04 DOA: 05/09/2019 PCP: Willey Blade, MD   Brief Narrative: 78 year old female with PMH of uterine cancer s/p radiation, osteoporosis, depression, chronic back pain, breast cancer, anxiety admitted with constipation, difficulty urinating and lower extremity weakness  Assessment & Plan:   Principal Problem:   Acute cystitis Active Problems:   Pulmonary embolism (HCC)   Weakness   CKD (chronic kidney disease), stage III   UTI (urinary tract infection)   Constipation   Fecal impaction ([place])   #1 E. coli UTI sensitive to ceftriaxone on ceftriaxone.  Patient presented with urinary retention in the setting of severe constipation.  She had Foley catheter placed which was removed 05/12/2019.  She has been passing urine without difficulty.  #2 severe constipation patient has been having multiple bowel movements.  She had 7 loose bowel movements will hold her senna Colace and MiraLAX scheduled.  #3 CKD stage III at baseline stable  #4 status post sinus tachycardia resolved likely secondary to UTI  #5 generalized weakness with bilateral lower extremity weakness ongoing for weeks with deconditioning-patient seen by PT and recommending SNF.  Plan [facility]/15/2019.  She is Covid - 05/13/2019  #6 history of pulmonary embolism on Eliquis  #7 hypertension 123/64 continue metoprolol and Aldactone.  #8 depression and anxiety continue Cymbalta    Estimated body mass index is 46.01 kg/m as calculated from the following:   Height as of this encounter: 5\' 6"  (1.676 m).   Weight as of this encounter: 129.3 kg.  DVT prophylaxis:On:Eliquis Code Status:Full code Family Communication: None  Disposition Plan: Plan is to discharge to SNF Covid test ordered today Consultants:  None  Procedures:  None  Antimicrobials:   Ceftriaxone Subjective:  Resting in bed no new complaints anxious to go  to rehab and start feeling better Objective: Vitals:   05/13/19 1427 05/13/19 1812 05/13/19 2154 05/14/19 0641  BP: 124/64 123/67 138/66 123/64  Pulse: 93 94 100 89  Resp: 16 18 17 16   Temp: 98.3 F (36.8 C) 98.4 F (36.9 C) 98 F (36.7 C) 98 F (36.7 C)  TempSrc: Oral Oral Oral Oral  SpO2: 96% 93% 94% 92%  Weight:      Height:        Intake/Output Summary (Last 24 hours) at 05/14/2019 1139 Last data filed at 05/13/2019 1742 Gross per 24 hour  Intake 578.13 ml  Output 400 ml  Net 178.13 ml   Filed Weights   05/10/19 0623  Weight: 129.3 kg    Examination:  General exam: Appears calm and comfortable  Respiratory system: Clear to auscultation. Respiratory effort normal. Cardiovascular system: S1 & S2 heard, RRR. No JVD, murmurs, rubs, gallops or clicks. No pedal edema. Gastrointestinal system: Abdomen is nondistended, soft and nontender. No organomegaly or masses felt. Normal bowel sounds heard. Central nervous system: Alert and oriented. No focal neurological deficits. Extremities: Trace edema bilaterally Skin: No rashes, lesions or ulcers Psychiatry: Judgement and insight appear normal. Mood & affect appropriate.     Data Reviewed: I have personally reviewed following labs and imaging studies  CBC: Recent Labs  Lab 05/09/19 1918 05/10/19 0650 05/12/19 0148  WBC 8.4 12.0* 10.0  NEUTROABS 5.8  --   --   HGB 14.9 14.6 13.1  HCT 49.6* 47.7* 43.9  MCV 90.5 89.8 89.4  PLT 319 307 [id number]   Basic Metabolic Panel: Recent Labs  Lab 05/09/19 1918 05/10/19 0650  NA 134* 133*  K 4.5 4.1  CL 100 100  CO2 22 21*  GLUCOSE 114* 125*  BUN 19 22  CREATININE 1.13* 1.36*  CALCIUM 10.0 9.9   GFR: Estimated Creatinine Clearance: 48.5 mL/min (A) (by C-G formula based on SCr of 1.36 mg/dL (H)). Liver Function Tests: Recent Labs  Lab 05/09/19 1918 05/10/19 0650  AST 17 21  ALT 15 17  ALKPHOS 88 94  BILITOT 0.6 0.8  PROT 7.7 7.4  ALBUMIN 3.7 3.7   Recent Labs   Lab 05/09/19 1918  LIPASE 34   No results for input(s): AMMONIA in the last 168 hours. Coagulation Profile: No results for input(s): INR, PROTIME in the last 168 hours. Cardiac Enzymes: No results for input(s): CKTOTAL, CKMB, CKMBINDEX, TROPONINI in the last 168 hours. BNP (last 3 results) No results for input(s): PROBNP in the last 8760 hours. HbA1C: No results for input(s): HGBA1C in the last 72 hours. CBG: No results for input(s): GLUCAP in the last 168 hours. Lipid Profile: No results for input(s): CHOL, HDL, LDLCALC, TRIG, CHOLHDL, LDLDIRECT in the last 72 hours. Thyroid Function Tests: No results for input(s): TSH, T4TOTAL, FREET4, T3FREE, THYROIDAB in the last 72 hours. Anemia Panel: No results for input(s): VITAMINB12, FOLATE, FERRITIN, TIBC, IRON, RETICCTPCT in the last 72 hours. Sepsis Labs: No results for input(s): PROCALCITON, LATICACIDVEN in the last 168 hours.  Recent Results (from the past 240 hour(s))  Urine culture     Status: Abnormal   Collection Time: 05/09/19  9:39 PM   Specimen: Urine, Catheterized  Result Value Ref Range Status   Specimen Description   Final    Urine Performed at [facility] [address], [place], [place] [address]    Special Requests   Final    NONE Performed at [facility], [place] [address], [place], [place] [address]    Culture >=100,000 COLONIES/mL ESCHERICHIA COLI (A)  Final   Report Status 05/11/2019 FINAL  Final   Organism ID, Bacteria ESCHERICHIA COLI (A)  Final      Susceptibility   Escherichia coli - MIC*    AMPICILLIN <=2 SENSITIVE Sensitive     CEFAZOLIN <=4 SENSITIVE Sensitive     CEFEPIME <=1 SENSITIVE Sensitive     CEFTAZIDIME <=1 SENSITIVE Sensitive     CEFTRIAXONE <=1 SENSITIVE Sensitive     CIPROFLOXACIN <=0.25 SENSITIVE Sensitive     GENTAMICIN <=1 SENSITIVE Sensitive     IMIPENEM <=0.25 SENSITIVE Sensitive     TRIMETH/SULFA <=20 SENSITIVE Sensitive      AMPICILLIN/SULBACTAM <=2 SENSITIVE Sensitive     PIP/TAZO <=4 SENSITIVE Sensitive     * >=100,000 COLONIES/mL ESCHERICHIA COLI  SARS CORONAVIRUS 2 (TAT 6-24 HRS) Nasopharyngeal Nasopharyngeal Swab     Status: None   Collection Time: 05/10/19  1:55 AM   Specimen: Nasopharyngeal Swab  Result Value Ref Range Status   SARS Coronavirus 2 NEGATIVE NEGATIVE Final    Comment: (NOTE) SARS-CoV-2 target nucleic acids are NOT DETECTED. The SARS-CoV-2 RNA is generally detectable in upper and lower respiratory specimens during the acute phase of infection. Negative results do not preclude SARS-CoV-2 infection, do not rule out co-infections with other pathogens, and should not be used as the sole basis for treatment or other patient management decisions. Negative results must be combined with clinical observations, patient history, and epidemiological information. The expected result is Negative. Fact Sheet for Patients: SugarRoll.be Fact Sheet for Healthcare Providers: https://www.woods-.com/ This test is not yet approved or cleared by the [place]  States FDA and  has been authorized for detection and/or diagnosis of SARS-CoV-2 by FDA under an Emergency Use Authorization (EUA). This EUA will remain  in effect (meaning this test can be used) for the duration of the COVID-19 declaration under Section 56 4(b)(1) of the Act, 21 U.S.C. section 360bbb-3(b)(1), unless the authorization is terminated or revoked sooner. Performed at [facility], [place] [address], [place], [place] [address]   SARS CORONAVIRUS 2 (TAT 6-24 HRS) Nasopharyngeal Nasopharyngeal Swab     Status: None   Collection Time: 05/13/19  9:22 AM   Specimen: Nasopharyngeal Swab  Result Value Ref Range Status   SARS Coronavirus 2 NEGATIVE NEGATIVE Final    Comment: (NOTE) SARS-CoV-2 target nucleic acids are NOT DETECTED. The SARS-CoV-2 RNA is generally detectable in upper and  lower respiratory specimens during the acute phase of infection. Negative results do not preclude SARS-CoV-2 infection, do not rule out co-infections with other pathogens, and should not be used as the sole basis for treatment or other patient management decisions. Negative results must be combined with clinical observations, patient history, and epidemiological information. The expected result is Negative. Fact Sheet for Patients: SugarRoll.be Fact Sheet for Healthcare Providers: https://www.woods-[employer].com/ This test is not yet approved or cleared by the [place] FDA and  has been authorized for detection and/or diagnosis of SARS-CoV-2 by FDA under an Emergency Use Authorization (EUA). This EUA will remain  in effect (meaning this test can be used) for the duration of the COVID-19 declaration under Section 56 4(b)(1) of the Act, 21 U.S.C. section 360bbb-3(b)(1), unless the authorization is terminated or revoked sooner. Performed at [facility], [place] [address], [place], [place] [address]          Radiology Studies: No results found.      Scheduled Meds: . apixaban  5 mg Oral BID  . buPROPion  150 mg Oral BID  . carisoprodol  350 mg Oral BID  . Chlorhexidine Gluconate Cloth  6 each Topical Daily  . docusate sodium  100 mg Oral Daily  . DULoxetine  20 mg Oral Daily  . exemestane  25 mg Oral Daily  . fenofibrate  160 mg Oral Daily  . gabapentin  300 mg Oral QHS  . metoprolol succinate  50 mg Oral BID  . polyethylene glycol  17 g Oral Daily  . polyethylene glycol-electrolytes  2,000 mL Oral Once  . senna  1 tablet Oral Daily  . spironolactone  50 mg Oral [id number]   Continuous Infusions: . sodium chloride Stopped (05/13/19 1418)  . cefTRIAXone (ROCEPHIN)  IV Stopped (05/13/19 1416)     LOS: 3 days     Georgette Shell, MD [facility] If 7PM-7AM, please contact  night-coverage [email or web address] Password [facility] 05/14/2019, 11:39 AM

## 2019-05-14 NOTE — Progress Notes (Signed)
Physical Therapy Treatment Patient Details Name: [name] MRN: [id number] DOB: January 01, 1941 Today's Date: 05/14/2019    History of Present Illness 78 year old female with PMH of uterine cancer s/p radiation, osteoporosis, depression, chronic back pain, breast cancer, anxiety admitted with constipation, difficulty urinating and lower extremity weakness.Patient states that she is having difficulty in ambulation for the last few days and the condition continue to worsen.    PT Comments    Patient continued today with bed mobility and sit<>stand transfer training. She continues to be slow to move and required assistance for LE movement and to raise trunk to sit EOB. Pt performed increased repetition for sit<>stands with Denna Haggard, and increased stand time from 5-15 seconds to ~ 2 minutes during this session. Pt continues to require extended seated rest breaks between sit<>stands. EOS pt was assisted to recliner. She will continue to benefit from skilled PT interventions to address impairments and progress functional mobility and independence. Continue to recommend SNF, acute PT will progress as able.   Follow Up Recommendations  SNF     Equipment Recommendations  None recommended by PT    Recommendations for Other Services       Precautions / Restrictions Precautions Precautions: Fall Restrictions Weight Bearing Restrictions: No    Mobility  Bed Mobility Overal bed mobility: Needs Assistance Bed Mobility: Supine to Sit     Supine to sit: Mod assist;HOB elevated;+2 for safety/equipment     General bed mobility comments: pt requried assist to walk bil LE's to EOB and verbal/tactile cues for reachign Rt UE to grab bar of bed to assist with pivot to Lt side. mod assist +2 required for completing pivot bring LE's to EOB and raise trunk upright, verbal/tactile cues for reaching Rt UE to grab bar for support in sitting.  Transfers Overall transfer level: Needs assistance    Transfers: Sit to/from Stand Sit to Stand: From elevated surface;Mod assist;+2 safety/equipment;+2 physical assistance         General transfer comment: pt performed 2x mini-stand from EOB prior to complete rise to stand and bring Stedy paddles back for seated rest. 5x sit<>stand performed  with 5-15seconds of standing during each and extended 1-2 minutes seated rest between. Pt performed ~ 69minutes of standing on last attempt for BM clean up. Final sit<>stand performed from [facility] to then sit in bedside recliner at EOS, chair slightly lower and pt reporting some discomfort in back and requiring increased assistance for controlled lower.  Ambulation/Gait            Stairs        Wheelchair Mobility    Modified Rankin (Stroke Patients Only)       Balance Overall balance assessment: Needs assistance Sitting-balance support: Bilateral upper extremity supported;Feet supported Sitting balance-Leahy Scale: Fair Sitting balance - Comments: sits with BUE support   Standing balance support: During functional activity;Bilateral upper extremity supported Standing balance-Leahy Scale: Poor Standing balance comment: pt heavily reliant on external support           Cognition Arousal/Alertness: Awake/alert Behavior During Therapy: Anxious Overall Cognitive Status: Within Functional Limits for tasks assessed              General Comments: pt continues to be anxious with mobility but is agreeable to perform all bed mob and transfers for therpay session      Exercises      General Comments        Pertinent Vitals/Pain Pain Assessment: No/denies pain  PT Goals (current goals can now be found in the care plan section) Acute Rehab PT Goals Patient Stated Goal: get stronger, move better, be more independent PT Goal Formulation: With patient Time For Goal Achievement: 05/25/19 Potential to Achieve Goals: Fair Progress towards PT goals: Progressing toward  goals    Frequency    Min 2X/week      PT Plan Current plan remains appropriate       AM-PAC PT "6 Clicks" Mobility   Outcome Measure  Help needed turning from your back to your side while in a flat bed without using bedrails?: A Little Help needed moving from lying on your back to sitting on the side of a flat bed without using bedrails?: A Little Help needed moving to and from a bed to a chair (including a wheelchair)?: Total Help needed standing up from a chair using your arms (e.g., wheelchair or bedside chair)?: A Lot Help needed to walk in hospital room?: Total Help needed climbing 3-5 steps with a railing? : Total 6 Click Score: 11    End of Session Equipment Utilized During Treatment: Gait belt Activity Tolerance: Patient tolerated treatment well Patient left: in chair;with call bell/phone within reach;with chair alarm set Nurse Communication: Mobility status;Need for lift equipment PT Visit Diagnosis: Unsteadiness on feet (R26.81);Muscle weakness (generalized) (M62.81);History of falling (Z91.81);Difficulty in walking, not elsewhere classified (R26.2)     Time: [id number] PT Time Calculation (min) (ACUTE ONLY): 40 min  Charges:  $Therapeutic Activity: 38-52 mins                     Gwynneth Albright PT, DPT Physical Therapist with [facility]  05/14/2019 2:09 PM

## 2019-05-14 NOTE — Care Management Important Message (Signed)
Important Message  Patient Details IM Letter given to Marney Doctor RN Case Manager to present to the Patient Name: [name] MRN: [id number] Date of Birth: 1940-08-23   Medicare Important Message Given:  Yes     [name] 05/14/2019, 11:10 AM

## 2019-05-15 MED ORDER — ONDANSETRON HCL 4 MG PO TABS: 4.0000 mg | ORAL_TABLET | Freq: Four times a day (QID) | ORAL | 0 refills | Status: DC | PRN

## 2019-05-15 MED ORDER — DOCUSATE SODIUM 100 MG PO CAPS: 100.0000 mg | ORAL_CAPSULE | Freq: Every day | ORAL | 0 refills | Status: AC

## 2019-05-15 MED ORDER — SENNA 8.6 MG PO TABS: 1.0000 | ORAL_TABLET | Freq: Every day | ORAL | 0 refills | Status: DC

## 2019-05-15 MED ORDER — LIP MEDEX EX OINT
TOPICAL_OINTMENT | CUTANEOUS | Status: AC
  Filled 2019-05-15: qty 7

## 2019-05-15 MED ORDER — POLYETHYLENE GLYCOL 3350 17 G PO PACK: 17.0000 g | PACK | Freq: Every day | ORAL | 0 refills | Status: DC

## 2019-05-15 NOTE — Progress Notes (Signed)
Report called to [place] at [employer]. All questions and concerns addressed. Patient to be transported via [place].

## 2019-05-15 NOTE — Discharge Summary (Signed)
Physician Discharge Summary  THAIZ TROST [id number] DOB: 08-19-1940 DOA: 05/09/2019  PCP: Willey Blade, MD  Admit date: 05/09/2019 Discharge date: 05/15/2019  Admitted From: Home Disposition: Nursing home  Recommendations for Outpatient Follow-up:  1. Follow up with PCP in 1-2 weeks 2. Please obtain BMP/CBC in one week   Home Health: None Equipment/Devices: None Discharge Condition: Stable stable and improved CODE STATUS:F full code Diet recommendation: Cardiac diet Brief/Interim Summary:  78 year old female with PMH of uterine cancer s/p radiation, osteoporosis, depression, chronic back pain, breast cancer, anxiety admitted with constipation, difficulty urinating and lower extremity weakness  Discharge Diagnoses:  Principal Problem:   Acute cystitis Active Problems:   Pulmonary embolism (HCC)   Weakness   CKD (chronic kidney disease), stage III   UTI (urinary tract infection)   Constipation   Fecal impaction ([place])   #1 E. coli UTI sensitive to ceftriaxone, was treated with ceftriaxone.  Patient presented with urinary retention in the setting of severe constipation. She had Foley catheter placed which was removed 05/12/2019.  She has been passing urine without difficulty.  #2 severe constipation resolved continue standing dose of stool softeners.    #3 CKD stage III at baseline stable  #4 status post sinus tachycardia resolved likely secondary to UTI  #5 generalized weakness with bilateral lower extremity weakness ongoing for weeks with deconditioning-patient seen by PT and recommending SNF.  Plan discharge to [facility] 05/15/2019.  She is Covid - 05/13/2019  #6 history of pulmonary embolism on Eliquis  #7 hypertension 123/64 continue metoprolol and Aldactone.  #8 depression and anxiety continue Cymbalta and Wellbutrin   Estimated body mass index is 46.01 kg/m as calculated from the following:   Height as of this encounter: 5\' 6"  (1.676 m).    Weight as of this encounter: 129.3 kg.  Discharge Instructions  Discharge Instructions    Call MD for:  difficulty breathing, headache or visual disturbances   Complete by: As directed    Call MD for:  persistant nausea and vomiting   Complete by: As directed    Call MD for:  temperature >100.4   Complete by: As directed    Diet - low sodium heart healthy   Complete by: As directed    Increase activity slowly   Complete by: As directed      Allergies as of 05/15/2019      Reactions   Iodine Rash      Medication List    STOP taking these medications   carisoprodol 350 MG tablet Commonly known as: SOMA     TAKE these medications   acetaminophen 650 MG CR tablet Commonly known as: TYLENOL Take 1,300 mg by mouth every 8 (eight) hours as needed for pain.   apixaban 5 MG Tabs tablet Commonly known as: ELIQUIS Take 5 mg by mouth 2 (two) times daily.   Biofreeze Roll-On 4 % Gel Generic drug: Menthol (Topical Analgesic) Apply 1 application topically 2 (two) times daily as needed (heel pain).   buPROPion 150 MG 12 hr tablet Commonly known as: WELLBUTRIN SR Take 150 mg by mouth 2 (two) times daily.   docusate sodium 100 MG capsule Commonly known as: COLACE Take 1 capsule (100 mg total) by mouth daily.   DULoxetine 20 MG capsule Commonly known as: CYMBALTA Take 1 capsule (20 mg total) by mouth daily.   eszopiclone 2 MG Tabs tablet Commonly known as: LUNESTA Take 2 mg by mouth at bedtime as needed for sleep.   exemestane 25 MG  tablet Commonly known as: AROMASIN Take 1 tablet by mouth once daily What changed: when to take this   fenofibrate 160 MG tablet Take 160 mg by mouth daily.   gabapentin 300 MG capsule Commonly known as: NEURONTIN TAKE 1 CAPSULE BY MOUTH AT BEDTIME What changed: when to take this   HYDROcodone-acetaminophen 5-325 MG tablet Commonly known as: NORCO/VICODIN Take 1 tablet by mouth every 8 (eight) hours as needed for moderate pain.    metFORMIN 500 MG tablet Commonly known as: GLUCOPHAGE Take 1 tablet (500 mg total) by mouth 2 (two) times daily with a meal.   metoprolol succinate 50 MG 24 hr tablet Commonly known as: TOPROL-XL Take 50 mg by mouth 2 (two) times daily.   ondansetron 4 MG tablet Commonly known as: ZOFRAN Take 1 tablet (4 mg total) by mouth every 6 (six) hours as needed for nausea.   polyethylene glycol 17 g packet Commonly known as: MIRALAX / GLYCOLAX Take 17 g by mouth daily.   QUINOA KALE & HEMP PO Take 5 drops by mouth daily.   senna 8.6 MG Tabs tablet Commonly known as: SENOKOT Take 1 tablet (8.6 mg total) by mouth daily.   spironolactone 50 MG tablet Commonly known as: ALDACTONE Take 50 mg by mouth every morning.      Follow-up Information    Willey Blade, MD Follow up.   Specialty: Internal Medicine Contact information: [address] [place] [address] [phone]          Allergies  Allergen Reactions  . Iodine Rash    Consultations: None  Procedures/Studies: DG Lumbar Spine Complete  Result Date: 05/09/2019 CLINICAL DATA:  Fall with weakness. EXAM: LUMBAR SPINE - COMPLETE 4+ VIEW COMPARISON:  Lumbar MRI dated May 06, 2016. FINDINGS: There is no displaced fracture involving the lumbar spine. There is questionable height loss of the T11 vertebral body. There is age-indeterminate height loss of the superior endplate of the L4 vertebral body. Aortic calcifications are noted. Multilevel disc height loss is noted throughout the lumbar spine, greatest at the L2-L3 level. IMPRESSION: 1. Age-indeterminate height loss of the L4 vertebral body. Correlation with point tenderness is recommended. This may represent an acute compression fracture in the appropriate clinical setting. 2. Questionable height loss of the T11 vertebral body. 3. Multilevel degenerative changes throughout the lumbar spine. 4. Aortic calcifications are noted. 5. Large stool burden at  the level of the rectum. Electronically Signed   By: Constance Holster M.D.   On: 05/09/2019 20:04   DG Abdomen Acute W/Chest  Result Date: 05/09/2019 CLINICAL DATA:  Fall with weakness. Constipation. EXAM: DG ABDOMEN ACUTE W/ 1V CHEST COMPARISON:  None. FINDINGS: There is a large stool burden at the level of the rectum, concerning for fecal impaction. There is a moderate amount of stool in the descending colon. There is no radiographic evidence for small bowel obstruction. Degenerative changes are noted throughout the thoracic spine. Aortic calcifications are noted. There are advanced degenerative changes of both femoroacetabular joints. There is elevation of the right hemidiaphragm. There is atelectasis at the lung bases. There is no pneumothorax. No large pleural effusion. Advanced degenerative changes are noted of the bilateral glenohumeral joints. The heart size is relatively normal. IMPRESSION: 1. Large stool burden at the level of the rectum, concerning for fecal impaction. 2. Moderate amount of stool in the descending colon. 3. Bibasilar atelectasis. 4. Advanced degenerative changes of both hips. 5. No acute cardiopulmonary process. Electronically Signed   By: Harrell Gave  Green M.D.   On: 05/09/2019 20:06    (Echo, Carotid, EGD, Colonoscopy, ERCP)    Subjective: Patient is resting in bed anxious to go home anxious to go to rehab start walking again before she can go home No nausea vomiting diarrhea.  Discharge Exam: Vitals:   05/14/19 2118 05/15/19 0634  BP: 130/75 120/73  Pulse: 95 82  Resp: 20 16  Temp: 98.6 F (37 C) 98.2 F (36.8 C)  SpO2: 92% 95%   Vitals:   05/14/19 0641 05/14/19 1603 05/14/19 2118 05/15/19 0634  BP: 123/64 (!) 147/98 130/75 120/73  Pulse: 89 90 95 82  Resp: 16 18 20 16   Temp: 98 F (36.7 C) 97.9 F (36.6 C) 98.6 F (37 C) 98.2 F (36.8 C)  TempSrc: Oral Oral Oral Oral  SpO2: 92% 94% 92% 95%  Weight:      Height:        General: Pt is alert,  awake, not in acute distress Cardiovascular: RRR, S1/S2 +, no rubs, no gallops Respiratory: CTA bilaterally, no wheezing, no rhonchi Abdominal: Soft, NT, ND, bowel sounds + Extremities: no edema, no cyanosis    The results of significant diagnostics from this hospitalization (including imaging, microbiology, ancillary and laboratory) are listed below for reference.     Microbiology: Recent Results (from the past 240 hour(s))  Urine culture     Status: Abnormal   Collection Time: 05/09/19  9:39 PM   Specimen: Urine, Catheterized  Result Value Ref Range Status   Specimen Description   Final    Urine Performed at [facility] [address], [place], [place] [address]    Special Requests   Final    NONE Performed at [facility], [place] [address] [place], [place] [address]    Culture >=100,000 COLONIES/mL ESCHERICHIA COLI (A)  Final   Report Status 05/11/2019 FINAL  Final   Organism ID, Bacteria ESCHERICHIA COLI (A)  Final      Susceptibility   Escherichia coli - MIC*    AMPICILLIN <=2 SENSITIVE Sensitive     CEFAZOLIN <=4 SENSITIVE Sensitive     CEFEPIME <=1 SENSITIVE Sensitive     CEFTAZIDIME <=1 SENSITIVE Sensitive     CEFTRIAXONE <=1 SENSITIVE Sensitive     CIPROFLOXACIN <=0.25 SENSITIVE Sensitive     GENTAMICIN <=1 SENSITIVE Sensitive     IMIPENEM <=0.25 SENSITIVE Sensitive     TRIMETH/SULFA <=20 SENSITIVE Sensitive     AMPICILLIN/SULBACTAM <=2 SENSITIVE Sensitive     PIP/TAZO <=4 SENSITIVE Sensitive     * >=100,000 COLONIES/mL ESCHERICHIA COLI  SARS CORONAVIRUS 2 (TAT 6-24 HRS) Nasopharyngeal Nasopharyngeal Swab     Status: None   Collection Time: 05/10/19  1:55 AM   Specimen: Nasopharyngeal Swab  Result Value Ref Range Status   SARS Coronavirus 2 NEGATIVE NEGATIVE Final    Comment: (NOTE) SARS-CoV-2 target nucleic acids are NOT DETECTED. The SARS-CoV-2 RNA is generally detectable in upper and lower respiratory specimens  during the acute phase of infection. Negative results do not preclude SARS-CoV-2 infection, do not rule out co-infections with other pathogens, and should not be used as the sole basis for treatment or other patient management decisions. Negative results must be combined with clinical observations, patient history, and epidemiological information. The expected result is Negative. Fact Sheet for Patients: SugarRoll.be Fact Sheet for Healthcare Providers: https://www.woods-.com/ This test is not yet approved or cleared by the [place] FDA and  has been authorized for detection and/or diagnosis of  SARS-CoV-2 by FDA under an Emergency Use Authorization (EUA). This EUA will remain  in effect (meaning this test can be used) for the duration of the COVID-19 declaration under Section 56 4(b)(1) of the Act, 21 U.S.C. section 360bbb-3(b)(1), unless the authorization is terminated or revoked sooner. Performed at [facility], [place] [address], [place], [place] [address]   SARS CORONAVIRUS 2 (TAT 6-24 HRS) Nasopharyngeal Nasopharyngeal Swab     Status: None   Collection Time: 05/13/19  9:22 AM   Specimen: Nasopharyngeal Swab  Result Value Ref Range Status   SARS Coronavirus 2 NEGATIVE NEGATIVE Final    Comment: (NOTE) SARS-CoV-2 target nucleic acids are NOT DETECTED. The SARS-CoV-2 RNA is generally detectable in upper and lower respiratory specimens during the acute phase of infection. Negative results do not preclude SARS-CoV-2 infection, do not rule out co-infections with other pathogens, and should not be used as the sole basis for treatment or other patient management decisions. Negative results must be combined with clinical observations, patient history, and epidemiological information. The expected result is Negative. Fact Sheet for Patients: SugarRoll.be Fact Sheet for Healthcare  Providers: https://www.woods-.com/ This test is not yet approved or cleared by the [place] FDA and  has been authorized for detection and/or diagnosis of SARS-CoV-2 by FDA under an Emergency Use Authorization (EUA). This EUA will remain  in effect (meaning this test can be used) for the duration of the COVID-19 declaration under Section 56 4(b)(1) of the Act, 21 U.S.C. section 360bbb-3(b)(1), unless the authorization is terminated or revoked sooner. Performed at [facility], [place] [address], [place], [place] [address]      Labs: BNP (last 3 results) No results for input(s): BNP in the last 8760 hours. Basic Metabolic Panel: Recent Labs  Lab 05/09/19 1918 05/10/19 0650  NA 134* 133*  K 4.5 4.1  CL 100 100  CO2 22 21*  GLUCOSE 114* 125*  BUN 19 22  CREATININE 1.13* 1.36*  CALCIUM 10.0 9.9   Liver Function Tests: Recent Labs  Lab 05/09/19 1918 05/10/19 0650  AST 17 21  ALT 15 17  ALKPHOS 88 94  BILITOT 0.6 0.8  PROT 7.7 7.4  ALBUMIN 3.7 3.7   Recent Labs  Lab 05/09/19 1918  LIPASE 34   No results for input(s): AMMONIA in the last 168 hours. CBC: Recent Labs  Lab 05/09/19 1918 05/10/19 0650 05/12/19 0148  WBC 8.4 12.0* 10.0  NEUTROABS 5.8  --   --   HGB 14.9 14.6 13.1  HCT 49.6* 47.7* 43.9  MCV 90.5 89.8 89.4  PLT 319 307 291   Cardiac Enzymes: No results for input(s): CKTOTAL, CKMB, CKMBINDEX, TROPONINI in the last 168 hours. BNP: Invalid input(s): POCBNP CBG: No results for input(s): GLUCAP in the last 168 hours. D-Dimer No results for input(s): DDIMER in the last 72 hours. Hgb A1c No results for input(s): HGBA1C in the last 72 hours. Lipid Profile No results for input(s): CHOL, HDL, LDLCALC, TRIG, CHOLHDL, LDLDIRECT in the last 72 hours. Thyroid function studies No results for input(s): TSH, T4TOTAL, T3FREE, THYROIDAB in the last 72 hours.  Invalid input(s): FREET3 Anemia work up No results for input(s):  VITAMINB12, FOLATE, FERRITIN, TIBC, IRON, RETICCTPCT in the last 72 hours. Urinalysis    Component Value Date/Time   COLORURINE YELLOW 05/09/2019 1919   APPEARANCEUR HAZY (A) 05/09/2019 1919   LABSPEC 1.010 05/09/2019 1919   PHURINE 6.0 05/09/2019 [place] 05/09/2019 1919   HGBUR  SMALL (A) 05/09/2019 1919   BILIRUBINUR NEGATIVE 05/09/2019 1919   KETONESUR NEGATIVE 05/09/2019 1919   PROTEINUR 30 (A) 05/09/2019 1919   UROBILINOGEN 0.2 03/31/2010 1116   NITRITE NEGATIVE 05/09/2019 1919   LEUKOCYTESUR LARGE (A) 05/09/2019 1919   Sepsis Labs Invalid input(s): PROCALCITONIN,  WBC,  LACTICIDVEN Microbiology Recent Results (from the past 240 hour(s))  Urine culture     Status: Abnormal   Collection Time: 05/09/19  9:39 PM   Specimen: Urine, Catheterized  Result Value Ref Range Status   Specimen Description   Final    Urine Performed at [facility], [place] [address], [place], [place] [address]    Special Requests   Final    NONE Performed at [facility], [place] [address] [place], [place] [address]    Culture >=100,000 COLONIES/mL ESCHERICHIA COLI (A)  Final   Report Status 05/11/2019 FINAL  Final   Organism ID, Bacteria ESCHERICHIA COLI (A)  Final      Susceptibility   Escherichia coli - MIC*    AMPICILLIN <=2 SENSITIVE Sensitive     CEFAZOLIN <=4 SENSITIVE Sensitive     CEFEPIME <=1 SENSITIVE Sensitive     CEFTAZIDIME <=1 SENSITIVE Sensitive     CEFTRIAXONE <=1 SENSITIVE Sensitive     CIPROFLOXACIN <=0.25 SENSITIVE Sensitive     GENTAMICIN <=1 SENSITIVE Sensitive     IMIPENEM <=0.25 SENSITIVE Sensitive     TRIMETH/SULFA <=20 SENSITIVE Sensitive     AMPICILLIN/SULBACTAM <=2 SENSITIVE Sensitive     PIP/TAZO <=4 SENSITIVE Sensitive     * >=100,000 COLONIES/mL ESCHERICHIA COLI  SARS CORONAVIRUS 2 (TAT 6-24 HRS) Nasopharyngeal Nasopharyngeal Swab     Status: None   Collection Time: 05/10/19  1:55 AM   Specimen: Nasopharyngeal  Swab  Result Value Ref Range Status   SARS Coronavirus 2 NEGATIVE NEGATIVE Final    Comment: (NOTE) SARS-CoV-2 target nucleic acids are NOT DETECTED. The SARS-CoV-2 RNA is generally detectable in upper and lower respiratory specimens during the acute phase of infection. Negative results do not preclude SARS-CoV-2 infection, do not rule out co-infections with other pathogens, and should not be used as the sole basis for treatment or other patient management decisions. Negative results must be combined with clinical observations, patient history, and epidemiological information. The expected result is Negative. Fact Sheet for Patients: SugarRoll.be Fact Sheet for Healthcare Providers: https://www.woods-.com/ This test is not yet approved or cleared by the [place] FDA and  has been authorized for detection and/or diagnosis of SARS-CoV-2 by FDA under an Emergency Use Authorization (EUA). This EUA will remain  in effect (meaning this test can be used) for the duration of the COVID-19 declaration under Section 56 4(b)(1) of the Act, 21 U.S.C. section 360bbb-3(b)(1), unless the authorization is terminated or revoked sooner. Performed at [facility], [place] [address], [place], [place] [address]   SARS CORONAVIRUS 2 (TAT 6-24 HRS) Nasopharyngeal Nasopharyngeal Swab     Status: None   Collection Time: 05/13/19  9:22 AM   Specimen: Nasopharyngeal Swab  Result Value Ref Range Status   SARS Coronavirus 2 NEGATIVE NEGATIVE Final    Comment: (NOTE) SARS-CoV-2 target nucleic acids are NOT DETECTED. The SARS-CoV-2 RNA is generally detectable in upper and lower respiratory specimens during the acute phase of infection. Negative results do not preclude SARS-CoV-2 infection, do not rule out co-infections with other pathogens, and should not be used as the sole basis for treatment or other patient management decisions. Negative results  must  be combined with clinical observations, patient history, and epidemiological information. The expected result is Negative. Fact Sheet for Patients: SugarRoll.be Fact Sheet for Healthcare Providers: https://www.woods-[employer]oki [employer].com/ This test is not yet approved or cleared by the [place] FDA and  has been authorized for detection and/or diagnosis of SARS-CoV-2 by FDA under an Emergency Use Authorization (EUA). This EUA will remain  in effect (meaning this test can be used) for the duration of the COVID-19 declaration under Section 56 4(b)(1) of the Act, 21 U.S.C. section 360bbb-3(b)(1), unless the authorization is terminated or revoked sooner. Performed at [facility], [place] [address], [place], [place] [address]      Time coordinating discharge:  35 minutes  SIGNED:   Georgette Shell, MD  [facility] 05/15/2019, 10:22 AM Pager   If 7PM-7AM, please contact night-coverage [email or web address] Password [email or web address]

## 2019-05-15 NOTE — TOC Transition Note (Signed)
Transition of Care [facility]) - CM/SW Discharge Note   Patient Details  Name: [name] MRN: [id number] Date of Birth: [birth year]  [facility] CM/SW Contact:  Lynnell Catalan, RN Phone Number: 05/15/2019, 11:29 AM   Clinical Narrative:    Josem Kaufmann received [id number]. [name] has bed available today. RN to call report to [phone]. Pt going to room 101b. [facility] transport set up for 4pm per facility request.   Final next level of care: Skilled Nursing Facility Barriers to Discharge: Continued Medical Work up, Ship broker   Patient Goals and CMS Choice   CMS Medicare.gov Compare Post Acute Care list provided to:: Patient Choice offered to / list presented to : Patient  Discharge Placement                       Discharge Plan and Services In-house Referral: Clinical Social Work Discharge Planning Services: CM Consult Post Acute Care Choice: [place]

## 2019-06-05 ENCOUNTER — Other Ambulatory Visit: Payer: Self-pay | Admitting: Oncology

## 2019-12-07 ENCOUNTER — Emergency Department (HOSPITAL_COMMUNITY): Payer: Medicare Other

## 2019-12-07 ENCOUNTER — Emergency Department (HOSPITAL_COMMUNITY)
Admission: EM | Admit: 2019-12-07 | Discharge: 2019-12-07 | Disposition: A | Discharge status: Home / Self Care | Payer: Medicare Other | Attending: Emergency Medicine | Admitting: Emergency Medicine

## 2019-12-07 DIAGNOSIS — Z7901 Long term (current) use of anticoagulants: Secondary | ICD-10-CM | POA: Insufficient documentation

## 2019-12-07 DIAGNOSIS — E1122 Type 2 diabetes mellitus with diabetic chronic kidney disease: Secondary | ICD-10-CM | POA: Diagnosis not present

## 2019-12-07 DIAGNOSIS — Z853 Personal history of malignant neoplasm of breast: Secondary | ICD-10-CM | POA: Insufficient documentation

## 2019-12-07 DIAGNOSIS — R109 Unspecified abdominal pain: Secondary | ICD-10-CM | POA: Diagnosis not present

## 2019-12-07 DIAGNOSIS — Z86711 Personal history of pulmonary embolism: Secondary | ICD-10-CM | POA: Diagnosis not present

## 2019-12-07 DIAGNOSIS — I129 Hypertensive chronic kidney disease with stage 1 through stage 4 chronic kidney disease, or unspecified chronic kidney disease: Secondary | ICD-10-CM | POA: Diagnosis not present

## 2019-12-07 DIAGNOSIS — N183 Chronic kidney disease, stage 3 unspecified: Secondary | ICD-10-CM | POA: Diagnosis not present

## 2019-12-07 DIAGNOSIS — K5900 Constipation, unspecified: Secondary | ICD-10-CM | POA: Diagnosis present

## 2019-12-07 LAB — CBC WITH DIFFERENTIAL/PLATELET
Abs Immature Granulocytes: 0.15 10*3/uL — ABNORMAL HIGH (ref 0.00–0.07)
Basophils Absolute: 0.1 10*3/uL (ref 0.0–0.1)
Basophils Relative: 1 %
Eosinophils Absolute: 0.1 10*3/uL (ref 0.0–0.5)
Eosinophils Relative: 1 %
HCT: 43.7 % (ref 36.0–46.0)
Hemoglobin: 13.3 g/dL (ref 12.0–15.0)
Immature Granulocytes: 2 %
Lymphocytes Relative: 19 %
Lymphs Abs: 1.6 10*3/uL (ref 0.7–4.0)
MCH: 27.7 pg (ref 26.0–34.0)
MCHC: 30.4 g/dL (ref 30.0–36.0)
MCV: 90.9 fL (ref 80.0–100.0)
Monocytes Absolute: 0.7 10*3/uL (ref 0.1–1.0)
Monocytes Relative: 9 %
Neutro Abs: 5.6 10*3/uL (ref 1.7–7.7)
Neutrophils Relative %: 68 %
Platelets: 323 10*3/uL (ref 150–400)
RBC: 4.81 MIL/uL (ref 3.87–5.11)
RDW: 15.1 % (ref 11.5–15.5)
WBC: 8.2 10*3/uL (ref 4.0–10.5)
nRBC: 0 % (ref 0.0–0.2)

## 2019-12-07 LAB — LIPASE, BLOOD: Lipase: 38 U/L (ref 11–51)

## 2019-12-07 LAB — COMPREHENSIVE METABOLIC PANEL
ALT: 15 U/L (ref 0–44)
AST: 15 U/L (ref 15–41)
Albumin: 3.9 g/dL (ref 3.5–5.0)
Alkaline Phosphatase: 42 U/L (ref 38–126)
Anion gap: 9 (ref 5–15)
BUN: 22 mg/dL (ref 8–23)
CO2: 26 mmol/L (ref 22–32)
Calcium: 10.1 mg/dL (ref 8.9–10.3)
Chloride: 103 mmol/L (ref 98–111)
Creatinine, Ser: 0.99 mg/dL (ref 0.44–1.00)
GFR calc Af Amer: >60 mL/min (ref >60)
GFR calc non Af Amer: 55 mL/min — ABNORMAL LOW (ref >60)
Glucose, Bld: 136 mg/dL — ABNORMAL HIGH (ref 70–99)
Potassium: 4.9 mmol/L (ref 3.5–5.1)
Sodium: 138 mmol/L (ref 135–145)
Total Bilirubin: 0.6 mg/dL (ref 0.3–1.2)
Total Protein: 7.3 g/dL (ref 6.5–8.1)

## 2019-12-07 LAB — URINALYSIS, ROUTINE W REFLEX MICROSCOPIC
Bilirubin Urine: NEGATIVE
Glucose, UA: NEGATIVE mg/dL
Hgb urine dipstick: NEGATIVE
Ketones, ur: NEGATIVE mg/dL
Nitrite: NEGATIVE
Protein, ur: NEGATIVE mg/dL
Specific Gravity, Urine: 1.033 — ABNORMAL HIGH (ref 1.005–1.030)
pH: 5 (ref 5.0–8.0)

## 2019-12-07 MED ORDER — SODIUM CHLORIDE (PF) 0.9 % IJ SOLN
INTRAMUSCULAR | Status: AC
  Filled 2019-12-07: qty 50

## 2019-12-07 MED ORDER — MAGNESIUM CITRATE PO SOLN
1.0000 | Freq: Once | ORAL | Status: AC
  Administered 2019-12-07: 1 via ORAL
  Filled 2019-12-07: qty 296

## 2019-12-07 MED ORDER — ONDANSETRON HCL 4 MG/2ML IJ SOLN
4.0000 mg | Freq: Once | INTRAMUSCULAR | Status: DC
  Filled 2019-12-07: qty 2

## 2019-12-07 MED ORDER — IOHEXOL 300 MG/ML  SOLN
100.0000 mL | Freq: Once | INTRAMUSCULAR | Status: AC | PRN
  Administered 2019-12-07: 100 mL via INTRAVENOUS

## 2019-12-07 NOTE — ED Notes (Signed)
Patient transported to CT at this time. 

## 2019-12-07 NOTE — ED Provider Notes (Signed)
[facility] DEPT Provider Note   CSN: [id number] Arrival date & time: 12/07/19  9379     History Chief Complaint  Patient presents with   Abdominal Pain    [name] is a 79 y.o. female.  Patient is a 79 year old female coming from assisted living facility with a history of hypertension, obesity, hyperlipidemia, chronic constipation, pulmonary embolism on Eliquis, diabetes presenting to the emergency department for nausea, vomiting constipation. Patient reports that she has had chronic constipation since last November. Has a bowel movement every several days and needs suppositories to move her bowels. She reports that she feels like her constipation is getting worse to the point where the last 3 days she has had nausea and vomiting associated with eating. Reports that she just drink water this morning and it caused her to vomit so she did cut decided to come into the emergency department. Also reports that she feels very bloated in her stomach and in her lower extremities. She has been taking all of her medications as prescribed. Denies any chest pain, shortness of breath, fever, dysuria.        Past Medical History:  Diagnosis Date   Allergy    Arthritis    "joints; mostly on the right side" (01/11/2013)   Breast cancer ([place])    "left" (01/11/2013)   Cancer ([place])    endometrial   Chronic lower back pain    Cold    just started 0/24/09 cough   Complication of anesthesia    very phobic about needles.last surgery had to sedate before doing iv   Depression    Exertional shortness of breath    Hyperlipidemia    Hypertension    Osteoporosis    knees    Personal history of radiation therapy 03/2013   left   Status post radiation therapy within last four weeks 03/19/13-05/03/13   lt breast 60.4GY   Uterine cancer [facility])     Patient Active Problem List   Diagnosis Date Noted   Fecal impaction ([place])    UTI (urinary tract  infection) 05/11/2019   Constipation    Acute cystitis 05/10/2019   CKD (chronic kidney disease), stage III 05/10/2019   Weakness 05/09/2019   Pulmonary embolus (H[facility])    Pulmonary embolus, left (HCC)    Pulmonary embolus, right (HCC)    CKD (chronic kidney disease), stage I    Bilateral pulmonary embolism (HCC)    PE (pulmonary embolism)    Acute kidney injury superimposed on chronic kidney disease ([place]) 11/30/2015   Depression 11/30/2015   Essential hypertension 11/30/2015   Acute respiratory failure with hypoxia ([place]) 11/30/2015   Diabetes mellitus type 2 in obese ([place]) 11/30/2015   Pulmonary embolism ([place]) 11/29/2015   Pulmonary embolism, bilateral ([place]) 11/29/2015   Pain in lower limb 01/10/2015   Malignant neoplasm of upper-outer quadrant of left breast in female, estrogen receptor positive ([place]) 01/24/2013   Edema 01/09/2013   Onychomycosis 01/09/2013   Pain in joint, ankle and foot 01/09/2013    Past Surgical History:  Procedure Laterality Date   ABDOMINAL HYSTERECTOMY  12/14/2012   BREAST BIOPSY Left 12/2012   BREAST LUMPECTOMY Left 2014   BREAST LUMPECTOMY WITH NEEDLE LOCALIZATION AND AXILLARY SENTINEL LYMPH NODE BX Left 01/11/2013   BREAST LUMPECTOMY WITH NEEDLE LOCALIZATION AND AXILLARY SENTINEL LYMPH NODE BX Left 01/11/2013   Procedure: BREAST LUMPECTOMY WITH NEEDLE LOCALIZATION AND AXILLARY SENTINEL LYMPH NODE BX;  Surgeon: Rolm Bookbinder, MD;  Location: [facility];  Service: General;  Laterality: Left;   CHOLECYSTECTOMY  1970   DILATION AND CURETTAGE OF UTERUS     "@ least one" (01/11/2013)   FOOT TENDON SURGERY Right    KNEE ARTHROSCOPY Right 1990's   "twice" (01/11/2013)   REPLACEMENT TOTAL KNEE Bilateral 2011-2012   TUBAL LIGATION       OB History   No obstetric history on file.     Family History  Problem Relation Age of Onset   Breast cancer Mother 73   Esophageal cancer Neg Hx    Colon cancer Neg Hx    Stomach  cancer Neg Hx    Rectal cancer Neg Hx     Social History   Tobacco Use   Smoking status: Never Smoker   Smokeless tobacco: Never Used  Vaping Use   Vaping Use: Never used  Substance Use Topics   Alcohol use: No    Alcohol/week: 0.0 standard drinks   Drug use: No    Home Medications Prior to Admission medications   Medication Sig Start Date End Date Taking? Authorizing Provider  acetaminophen (TYLENOL) 650 MG CR tablet Take 1,300 mg by mouth every 8 (eight) hours as needed for pain.     [provider]  apixaban (ELIQUIS) 5 MG TABS tablet Take 5 mg by mouth 2 (two) times daily.    [provider]  bisacodyl (DULCOLAX) 10 MG suppository Place 10 mg rectally daily. 11/29/19   [provider]  buPROPion (WELLBUTRIN SR) 150 MG 12 hr tablet Take 150 mg by mouth 2 (two) times daily. 11/11/15   [provider]  docusate sodium (COLACE) 100 MG capsule Take 1 capsule (100 mg total) by mouth daily. 05/15/19   Georgette Shell, MD  donepezil (ARICEPT) 5 MG tablet Take 5 mg by mouth daily. 11/21/19   [provider]  DULoxetine (CYMBALTA) 20 MG capsule Take 1 capsule (20 mg total) by mouth daily. 08/17/13   Gardenia Phlegm, NP  eszopiclone (LUNESTA) 2 MG TABS tablet Take 2 mg by mouth at bedtime as needed for sleep.  04/05/19   [provider]  exemestane (AROMASIN) 25 MG tablet Take 1 tablet by mouth once daily 06/05/19   Magrinat, Virgie Dad, MD  fenofibrate 160 MG tablet Take 160 mg by mouth daily. 10/02/15   [provider]  gabapentin (NEURONTIN) 300 MG capsule TAKE 1 CAPSULE BY MOUTH AT BEDTIME Patient taking differently: Take 300 mg by mouth 2 (two) times daily.  02/15/18   Magrinat, Virgie Dad, MD  HYDROcodone-acetaminophen (NORCO/VICODIN) 5-325 MG tablet Take 1 tablet by mouth every 8 (eight) hours as needed for moderate pain.  03/30/19   [provider]  Menthol, Topical Analgesic, (BIOFREEZE ROLL-ON) 4 % GEL  Apply 1 application topically 2 (two) times daily as needed (heel pain).     [provider]  metFORMIN (GLUCOPHAGE) 500 MG tablet Take 1 tablet (500 mg total) by mouth 2 (two) times daily with a meal. 12/09/15   [name], MD  metoprolol succinate (TOPROL-XL) 50 MG 24 hr tablet Take 50 mg by mouth 2 (two) times daily.  12/19/12   [provider]  Nutritional Supplements (QUINOA KALE & HEMP PO) Take 5 drops by mouth daily.     [provider]  ondansetron (ZOFRAN) 4 MG tablet Take 1 tablet (4 mg total) by mouth every 6 (six) hours as needed for nausea. 05/15/19   Georgette Shell, MD  polyethylene glycol [facility] / Floria Raveling) 17  g packet Take 17 g by mouth daily. 05/15/19   Georgette Shell, MD  senna (SENOKOT) 8.6 MG TABS tablet Take 1 tablet (8.6 mg total) by mouth daily. 05/15/19   Georgette Shell, MD  SENNA-PLUS 8.6-50 MG tablet  11/21/19   [provider]  spironolactone (ALDACTONE) 50 MG tablet Take 50 mg by mouth every morning. 04/24/17   [provider]    Allergies    Iodine  Review of Systems   Review of Systems  Constitutional: Positive for appetite change. Negative for activity change, fatigue, fever and unexpected weight change.  HENT: Negative.   Respiratory: Negative for cough and shortness of breath.   Cardiovascular: Positive for leg swelling. Negative for chest pain.  Gastrointestinal: Positive for abdominal pain, constipation, nausea and vomiting. Negative for diarrhea.  Genitourinary: Negative for dysuria and hematuria.  Musculoskeletal: Negative for back pain.  Skin: Negative for rash.  Neurological: Negative.   All other systems reviewed and are negative.   Physical Exam Updated Vital Signs BP 129/70    Pulse 89    Temp 98.6 F (37 C) (Oral)    Resp (!) 28    SpO2 96%   Physical Exam Vitals and nursing note reviewed.  Constitutional:      General: She is not in acute distress.    Appearance: Normal  appearance. She is well-developed. She is obese. She is not ill-appearing, toxic-appearing or diaphoretic.  HENT:     Head: Normocephalic.  Eyes:     Conjunctiva/sclera: Conjunctivae normal.  Cardiovascular:     Rate and Rhythm: Normal rate and regular rhythm.  Pulmonary:     Effort: Pulmonary effort is normal.  Abdominal:     General: Abdomen is protuberant. Bowel sounds are decreased.     Palpations: Abdomen is soft.     Tenderness: There is no abdominal tenderness.     Hernia: No hernia is present.     Comments: Obese belly with healed RUQ surgical scar  Musculoskeletal:     Right lower leg: Edema present.     Left lower leg: Edema present.  Skin:    General: Skin is warm and dry.  Neurological:     Mental Status: She is alert and oriented to person, place, and time.  Psychiatric:        Mood and Affect: Mood normal.     ED Results / Procedures / Treatments   Labs (all labs ordered are listed, but only abnormal results are displayed) Labs Reviewed  CBC WITH DIFFERENTIAL/PLATELET - Abnormal; Notable for the following components:      Result Value   Abs Immature Granulocytes 0.15 (*)    All other components within normal limits  COMPREHENSIVE METABOLIC PANEL - Abnormal; Notable for the following components:   Glucose, Bld 136 (*)    GFR calc non Af Amer 55 (*)    All other components within normal limits  URINALYSIS, ROUTINE W REFLEX MICROSCOPIC - Abnormal; Notable for the following components:   APPearance HAZY (*)    Specific Gravity, Urine 1.033 (*)    Leukocytes,Ua TRACE (*)    Bacteria, UA RARE (*)    All other components within normal limits  LIPASE, BLOOD    EKG None  Radiology CT ABDOMEN PELVIS W CONTRAST  Result Date: 12/07/2019 CLINICAL DATA:  Abdominal pain, nausea and vomiting, history of breast cancer EXAM: CT ABDOMEN AND PELVIS WITH CONTRAST TECHNIQUE: Multidetector CT imaging of the abdomen and pelvis was performed using the standard  protocol  following bolus administration of intravenous contrast. CONTRAST:  1103mL OMNIPAQUE IOHEXOL 300 MG/ML  SOLN COMPARISON:  02/08/2013 FINDINGS: Lower chest: Coronary artery calcifications. Consolidation of the right lung base with bronchiectatic air bronchograms (series 6, image 30). Hepatobiliary: No focal liver abnormality is seen. Hepatic steatosis. Status post cholecystectomy. No biliary dilatation. Pancreas: Unremarkable. No pancreatic ductal dilatation or surrounding inflammatory changes. Spleen: Normal in size without significant abnormality. Adrenals/Urinary Tract: Adrenal glands are unremarkable. Kidneys are normal, without renal calculi, solid lesion, or hydronephrosis. Bladder is unremarkable. Stomach/Bowel: Stomach is within normal limits. Appendix appears normal. No evidence of bowel wall thickening, distention, or inflammatory changes. Descending and sigmoid diverticulosis. Large burden of stool in the rectum. Vascular/Lymphatic: Aortic atherosclerosis. No enlarged abdominal or pelvic lymph nodes. Reproductive: Status post hysterectomy. Other: No abdominal wall hernia or abnormality. No abdominopelvic ascites. Musculoskeletal: No acute or significant osseous findings. New, although age indeterminate wedge deformity of the L4 vertebral body. IMPRESSION: 1. No acute CT findings of the abdomen or pelvis to explain abdominal pain, nausea, or vomiting. 2. Consolidation of the right lung base with bronchiectatic air bronchograms. Findings are most consistent with sequelae of prior infection or aspiration. 3. Hepatic steatosis. 4. Descending and sigmoid diverticulosis without evidence of acute diverticulitis. 5. Large burden of stool in the rectum. 6. New, although age indeterminate wedge deformity of the L4 vertebral body. Correlate for acute point tenderness. 7. Coronary artery disease.  Aortic Atherosclerosis (ICD10-I70.0). Electronically Signed   By: Eddie Candle M.D.   On: 12/07/2019 11:54     Procedures Procedures (including critical care time)  Medications Ordered in ED Medications  ondansetron (ZOFRAN) injection 4 mg (4 mg Intravenous Refused 12/07/19 1057)  sodium chloride (PF) 0.9 % injection (has no administration in time range)  iohexol (OMNIPAQUE) 300 MG/ML solution 100 mL (100 mLs Intravenous Contrast Given 12/07/19 1111)  magnesium citrate solution 1 Bottle (1 Bottle Oral Given 12/07/19 1319)    ED Course  I have reviewed the triage vital signs and the nursing notes.  Pertinent labs & imaging results that were available during my care of the patient were reviewed by me and considered in my medical decision making (see chart for details).  Clinical Course as of Dec 06 1404  Fri Dec 06, 5337  728 79 year old female complaining of nausea and vomiting along with loose stools for the past 3 days.  She said she has chronic issues with her abdomen and she has been trying to get in with GI.  She thinks she may have bowel obstruction.  Abdomen is soft some vague tenderness.  Getting labs, CT abdomen and pelvis.   [MB]  1100 Discussed Ct with radiology. Patient has a topical rash to iodine. She received IV contrast without pre-medication in 2017 for CTA and had no reaction. Will proceed with CT w/ contrast today   [KM]  1302 Patient has some constipation on her CT but no surgical/infectious problem. There was also a ?of compression fx which did not correlate with her physical exam. Patient given magnesium citrate to induce bowel movement. Patient reports she is supposed to see GI today for this ongoing constipation that she has had.  However, the nursing home thought she should come here first due to the vomiting being new.  Will refer back to GI.   [KM]    Clinical Course User Index [KM] Alveria Apley, PA-C [MB] Hayden Rasmussen, MD   MDM Rules/Calculators/A&P  Based on review of vitals, medical screening exam, lab work and/or imaging, there  does not appear to be an acute, emergent etiology for the patient's symptoms. Counseled pt on good return precautions and encouraged both PCP and ED follow-up as needed.  Prior to discharge, I also discussed incidental imaging findings with patient in detail and advised appropriate, recommended follow-up in detail.  Clinical Impression: 1. Constipation, unspecified constipation type     Disposition: Discharge  Prior to providing a prescription for a controlled substance, I independently reviewed the patient's recent prescription history on the [place]. The patient had no recent or regular prescriptions and was deemed appropriate for a brief, less than 3 day prescription of narcotic for acute analgesia.  This note was prepared with assistance of Systems analyst. Occasional wrong-word or sound-a-like substitutions may have occurred due to the inherent limitations of voice recognition software.  Final Clinical Impression(s) / ED Diagnoses Final diagnoses:  Constipation, unspecified constipation type    Rx / DC Orders ED Discharge Orders    None       Kristine Royal 12/07/19 1406    Hayden Rasmussen, MD 12/07/19 1807

## 2019-12-07 NOTE — ED Notes (Signed)
[facility] contacted regarding patient transport.

## 2019-12-07 NOTE — ED Triage Notes (Signed)
Transported by [facility] from [facility]-- patient presents with n/v x 3 days and also with complaints of fluid retention with swelling noted to the legs bilaterally and abdomen. VSS with EMS and CBG read 124 mg/dl.

## 2019-12-07 NOTE — Discharge Instructions (Signed)
Your work-up today showed that you have some constipation.  Please continue MiraLAX at home.  Please make sure you are drinking plenty of water and eating high-fiber diet.  Follow-up with the GI specialist in 1 to 2 weeks.  Return to the emergency department if you have any new or worsening symptoms. Thank you for allowing me to care for you today. Please return to the emergency department if you have new or worsening symptoms. Take your medications as instructed.

## 2019-12-25 ENCOUNTER — Other Ambulatory Visit: Payer: Self-pay | Admitting: Internal Medicine

## 2019-12-25 DIAGNOSIS — J181 Lobar pneumonia, unspecified organism: Secondary | ICD-10-CM

## 2019-12-27 ENCOUNTER — Other Ambulatory Visit: Payer: Self-pay | Admitting: Internal Medicine

## 2019-12-27 DIAGNOSIS — E2839 Other primary ovarian failure: Secondary | ICD-10-CM

## 2019-12-27 DIAGNOSIS — C50919 Malignant neoplasm of unspecified site of unspecified female breast: Secondary | ICD-10-CM

## 2019-12-28 ENCOUNTER — Telehealth: Payer: Self-pay | Admitting: Oncology

## 2019-12-28 NOTE — Telephone Encounter (Signed)
Scheduled appt per 8/5 sch msg - unable to reach pt .left message with appt date and time

## 2020-01-01 ENCOUNTER — Ambulatory Visit: Payer: Medicare Other | Admitting: Oncology

## 2020-01-01 ENCOUNTER — Other Ambulatory Visit: Payer: Medicare Other

## 2020-01-02 ENCOUNTER — Ambulatory Visit: Payer: Medicare Other | Admitting: Oncology

## 2020-01-02 ENCOUNTER — Other Ambulatory Visit: Payer: Medicare Other

## 2020-01-10 ENCOUNTER — Inpatient Hospital Stay (HOSPITAL_BASED_OUTPATIENT_CLINIC_OR_DEPARTMENT_OTHER): Payer: Medicare Other | Admitting: Oncology

## 2020-01-10 ENCOUNTER — Other Ambulatory Visit: Payer: Self-pay

## 2020-01-10 ENCOUNTER — Inpatient Hospital Stay: Payer: Medicare Other | Attending: Adult Health

## 2020-01-10 VITALS — BP 130/86 | HR 91 | Temp 97.0°F | Resp 18 | Ht 66.0 in | Wt 240.7 lb

## 2020-01-10 DIAGNOSIS — Z923 Personal history of irradiation: Secondary | ICD-10-CM | POA: Insufficient documentation

## 2020-01-10 DIAGNOSIS — C50412 Malignant neoplasm of upper-outer quadrant of left female breast: Secondary | ICD-10-CM | POA: Insufficient documentation

## 2020-01-10 DIAGNOSIS — Z79811 Long term (current) use of aromatase inhibitors: Secondary | ICD-10-CM | POA: Diagnosis not present

## 2020-01-10 DIAGNOSIS — I2692 Saddle embolus of pulmonary artery without acute cor pulmonale: Secondary | ICD-10-CM | POA: Diagnosis not present

## 2020-01-10 DIAGNOSIS — Z8542 Personal history of malignant neoplasm of other parts of uterus: Secondary | ICD-10-CM | POA: Diagnosis not present

## 2020-01-10 DIAGNOSIS — Z170 Estrogen receptor positive status [ER+]: Secondary | ICD-10-CM | POA: Diagnosis not present

## 2020-01-10 DIAGNOSIS — Z9049 Acquired absence of other specified parts of digestive tract: Secondary | ICD-10-CM | POA: Insufficient documentation

## 2020-01-10 DIAGNOSIS — Z90722 Acquired absence of ovaries, bilateral: Secondary | ICD-10-CM | POA: Insufficient documentation

## 2020-01-10 DIAGNOSIS — Z79818 Long term (current) use of other agents affecting estrogen receptors and estrogen levels: Secondary | ICD-10-CM | POA: Insufficient documentation

## 2020-01-10 LAB — COMPREHENSIVE METABOLIC PANEL
ALT: 24 U/L (ref 0–44)
AST: 19 U/L (ref 15–41)
Albumin: 3.5 g/dL (ref 3.5–5.0)
Alkaline Phosphatase: 62 U/L (ref 38–126)
Anion gap: 11 (ref 5–15)
BUN: 19 mg/dL (ref 8–23)
CO2: 21 mmol/L — ABNORMAL LOW (ref 22–32)
Calcium: 10.5 mg/dL — ABNORMAL HIGH (ref 8.9–10.3)
Chloride: 102 mmol/L (ref 98–111)
Creatinine, Ser: 1.18 mg/dL — ABNORMAL HIGH (ref 0.44–1.00)
GFR calc Af Amer: 52 mL/min — ABNORMAL LOW (ref >60)
GFR calc non Af Amer: 44 mL/min — ABNORMAL LOW (ref >60)
Glucose, Bld: 142 mg/dL — ABNORMAL HIGH (ref 70–99)
Potassium: 4.8 mmol/L (ref 3.5–5.1)
Sodium: 134 mmol/L — ABNORMAL LOW (ref 135–145)
Total Bilirubin: 0.5 mg/dL (ref 0.3–1.2)
Total Protein: 6.9 g/dL (ref 6.5–8.1)

## 2020-01-10 LAB — CBC WITH DIFFERENTIAL/PLATELET
Abs Immature Granulocytes: 0.2 10*3/uL — ABNORMAL HIGH (ref 0.00–0.07)
Basophils Absolute: 0 10*3/uL (ref 0.0–0.1)
Basophils Relative: 1 %
Eosinophils Absolute: 0.1 10*3/uL (ref 0.0–0.5)
Eosinophils Relative: 1 %
HCT: 43.8 % (ref 36.0–46.0)
Hemoglobin: 13.3 g/dL (ref 12.0–15.0)
Immature Granulocytes: 3 %
Lymphocytes Relative: 17 %
Lymphs Abs: 1.3 10*3/uL (ref 0.7–4.0)
MCH: 26.7 pg (ref 26.0–34.0)
MCHC: 30.4 g/dL (ref 30.0–36.0)
MCV: 88 fL (ref 80.0–100.0)
Monocytes Absolute: 0.5 10*3/uL (ref 0.1–1.0)
Monocytes Relative: 7 %
Neutro Abs: 5.7 10*3/uL (ref 1.7–7.7)
Neutrophils Relative %: 71 %
Platelets: 293 10*3/uL (ref 150–400)
RBC: 4.98 MIL/uL (ref 3.87–5.11)
RDW: 15.6 % — ABNORMAL HIGH (ref 11.5–15.5)
WBC: 7.9 10*3/uL (ref 4.0–10.5)
nRBC: 0 % (ref 0.0–0.2)

## 2020-01-10 NOTE — Progress Notes (Signed)
[facility]  [phone] [phone]   I have reviewed the above documentation for accuracy and completeness, and I agree with the above.   ID: [name] OB: [birth year]  MR#: [id number]  ASN#:[id number]  PCP: Willey Blade, MD GYN:  Genia Del SU: Rolm Bookbinder OTHER MD:  Kyung Rudd, Lennette Bihari supple  CHIEF COMPLAINT: Early stage breast cancer  TREATMENT: Exemestane  BREAST CANCER HISTORY: From Dr Jenny Reichmann Moody's initial consult note 01/29/2013:  "[name] is a 79 y.o. female who is seen for an initial consultation visit. The patient has had multiple medical issues recently. By report the patient proceeded with a hysterectomy/BSO for endometrial cancer recently at [facility]. The patient did have some difficulty recovering from this and was in a skilled nursing facility for a short period of time. She then thought that she felt a possible mass within the left breast and she sought further evaluation for this. The patient proceeded with a mammogram and ultrasound which corresponded to suspicious findings.a core biopsy was obtained and this returned positive for invasive ductal carcinoma. Receptor studies have indicated that the tumor is ER positive, PR positive, and HER-2/neu negative. She then proceeded to undergo an MRI scan of the breasts bilaterally on 12/26/2012. This revealed a 1.9 cm irregular enhancing mass within the left breast at the 12:00 position. This corresponded to the recently diagnosed invasive mammary carcinoma.   The patient was seen by Dr. Donne Hazel and she proceeded to perform a lumpectomy and sentinel lymph node evaluation on 01/11/2013. The patient's pathology returned positive for an invasive ductal carcinoma measuring 1.9 cm. The margins were negative with the closest margin being 1 cm. The sentinel lymph nodes were negative. This therefore represented a T1 C. N0 M0 tumor. "  Her subsequent history is  as detailed below  INTERVAL HISTORY: [name] returns today for follow-up and treatment of her estrogen receptor positive breast cancer.  She continues on exemestane, with no side effects that she is aware of.  In particular hot flashes and vaginal dryness are not a major concern.  REVIEW OF SYSTEMS: [name] is now at assisted living in [facility].  She likes the company but not the food.  She is losing a little weight which she says is a good thing.  She is getting some physical therapy.  She says when she arrived there she was able to walk but they would not believe her and so they left her in bed for a few weeks before starting rehab and she lost some muscle at that time.  At present though she denies any unusual headaches visual changes cough phlegm production pleurisy shortness of breath or change in bowel or bladder habits.  She has had 2 doses of a COVID-19 vaccine.  A detailed review of systems today was otherwise stable    PAST MEDICAL HISTORY: Past Medical History:  Diagnosis Date  . Allergy   . Arthritis    "joints; mostly on the right side" (01/11/2013)  . Breast cancer ([place])    "left" (01/11/2013)  . Cancer [facility])    endometrial  . Chronic lower back pain   . Cold    just started 01/02/13 cough  . Complication of anesthesia    very phobic about needles.last surgery had to sedate before doing iv  . Depression   . Exertional shortness of breath   . Hyperlipidemia   . Hypertension   . Osteoporosis    knees   . Personal  history of radiation therapy 03/2013   left  . Status post radiation therapy within last four weeks 03/19/13-05/03/13   lt breast 60.4GY  . Uterine cancer ([place])     PAST SURGICAL HISTORY: Past Surgical History:  Procedure Laterality Date  . ABDOMINAL HYSTERECTOMY  12/14/2012  . BREAST BIOPSY Left 12/2012  . BREAST LUMPECTOMY Left 2014  . BREAST LUMPECTOMY WITH NEEDLE LOCALIZATION AND AXILLARY SENTINEL LYMPH NODE BX Left 01/11/2013  . BREAST  LUMPECTOMY WITH NEEDLE LOCALIZATION AND AXILLARY SENTINEL LYMPH NODE BX Left 01/11/2013   Procedure: BREAST LUMPECTOMY WITH NEEDLE LOCALIZATION AND AXILLARY SENTINEL LYMPH NODE BX;  Surgeon: Rolm Bookbinder, MD;  Location: [facility];  Service: General;  Laterality: Left;  . CHOLECYSTECTOMY  1970  . DILATION AND CURETTAGE OF UTERUS     "@ least one" (01/11/2013)  . FOOT TENDON SURGERY Right   . KNEE ARTHROSCOPY Right 1990's   "twice" (01/11/2013)  . REPLACEMENT TOTAL KNEE Bilateral 2011-2012  . TUBAL LIGATION      FAMILY HISTORY Family History  Problem Relation Age of Onset  . Breast cancer Mother 48  . Esophageal cancer Neg Hx   . Colon cancer Neg Hx   . Stomach cancer Neg Hx   . Rectal cancer Neg Hx     the patient does not know the cause of death of either parent. She was a foster child and really knows very little about her family. She has some half sisters that she is aware of, but does not know them very well.   GYNECOLOGIC HISTORY:   menarche age 53, first live birth age 41, the patient is [name]. She went through the change of life around the age of 43. She took hormone replacement through the women's health study. 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 She is divorced, and currently lives in assisted living at [facility].  Daughter [name] unfortunately died, sudden death, cause unclear. Son [name] lives in [place] and has a Designer, jewellery in psychology. The patient has no grandchildren. She is not a Ambulance person.    ADVANCED DIRECTIVES: Not in place but the patient intends to name her son as her healthcare power of attorney. He can be reached at [phone]. At the 10/10/2013 visit the patient was given the appropriate documents to complete and notarize at her discretion   HEALTH MAINTENANCE: Social History   Tobacco Use  . Smoking status: Never Smoker  . Smokeless tobacco: Never  Used  Vaping Use  . Vaping Use: Never used  Substance Use Topics  . Alcohol use: No    Alcohol/week: 0.0 standard drinks  . Drug use: No     Colonoscopy:  PAP: Status post hysterectomy and bilateral salpingo-oophorectomy   Bone density: April 2014, showed minimal osteopenia, with a T score of -1.1  Lipid panel:  Allergies  Allergen Reactions  . Iodine Rash    Current Outpatient Medications  Medication Sig Dispense Refill  . acetaminophen (TYLENOL) 650 MG CR tablet Take 1,300 mg by mouth every 8 (eight) hours as needed for pain.     Marland Kitchen apixaban (ELIQUIS) 5 MG TABS tablet Take 5 mg by mouth 2 (two) times daily.    . bisacodyl (DULCOLAX) 10 MG suppository Place 10 mg rectally daily.    Marland Kitchen buPROPion (WELLBUTRIN SR) 150 MG 12 hr tablet Take 150 mg by mouth 2 (two) times daily.    Marland Kitchen docusate sodium (  COLACE) 100 MG capsule Take 1 capsule (100 mg total) by mouth daily. 10 capsule 0  . donepezil (ARICEPT) 5 MG tablet Take 5 mg by mouth daily.    . DULoxetine (CYMBALTA) 20 MG capsule Take 1 capsule (20 mg total) by mouth daily. 30 capsule 3  . eszopiclone (LUNESTA) 2 MG TABS tablet Take 2 mg by mouth at bedtime as needed for sleep.     Marland Kitchen exemestane (AROMASIN) 25 MG tablet Take 1 tablet by mouth once daily 90 tablet 0  . fenofibrate 160 MG tablet Take 160 mg by mouth daily.    Marland Kitchen gabapentin (NEURONTIN) 300 MG capsule TAKE 1 CAPSULE BY MOUTH AT BEDTIME (Patient taking differently: Take 300 mg by mouth 2 (two) times daily. ) 90 capsule 4  . HYDROcodone-acetaminophen (NORCO/VICODIN) 5-325 MG tablet Take 1 tablet by mouth every 8 (eight) hours as needed for moderate pain.     . Menthol, Topical Analgesic, (BIOFREEZE ROLL-ON) 4 % GEL Apply 1 application topically 2 (two) times daily as needed (heel pain).     . metFORMIN (GLUCOPHAGE) 500 MG tablet Take 1 tablet (500 mg total) by mouth 2 (two) times daily with a meal. 60 tablet 0  . metoprolol succinate (TOPROL-XL) 50 MG 24 hr tablet Take 50 mg by  mouth 2 (two) times daily.     . Nutritional Supplements (QUINOA KALE & HEMP PO) Take 5 drops by mouth daily.     . ondansetron (ZOFRAN) 4 MG tablet Take 1 tablet (4 mg total) by mouth every 6 (six) hours as needed for nausea. 20 tablet 0  . polyethylene glycol (MIRALAX / GLYCOLAX) 17 g packet Take 17 g by mouth daily. 14 each 0  . senna (SENOKOT) 8.6 MG TABS tablet Take 1 tablet (8.6 mg total) by mouth daily. 120 tablet 0  . SENNA-PLUS 8.6-50 MG tablet     . spironolactone (ALDACTONE) 50 MG tablet Take 50 mg by mouth every morning.     No current facility-administered medications for this visit.    OBJECTIVE: African-American Leon examined in a wheelchair  Vitals:   01/10/20 1350  BP: 130/86  Pulse: 91  Resp: 18  Temp: (!) 97 F (36.1 C)  SpO2: 97%     Body mass index is 38.85 kg/m.    ECOG FS:2 - Symptomatic, <50% confined to bed  Sclerae unicteric, EOMs intact Wearing a mask No cervical or supraclavicular adenopathy Lungs no rales or rhonchi Heart regular rate and rhythm Abd soft, obese, nontender, positive bowel sounds MSK no focal spinal tenderness Neuro: nonfocal, well oriented, positive affect Breasts: The right breast is benign.  The left breast has undergone lumpectomy followed by radiation.  It is smaller and the skin is coarser than on the right side but that is expected.  Both axillae are benign.  LAB RESULTS:  CMP     Component Value Date/Time   NA 134 (L) 01/10/2020 1323   NA 140 01/10/2017 1046   K 4.8 01/10/2020 1323   K 4.6 01/10/2017 1046   CL 102 01/10/2020 1323   CO2 21 (L) 01/10/2020 1323   CO2 25 01/10/2017 1046   GLUCOSE 142 (H) 01/10/2020 1323   GLUCOSE 116 01/10/2017 1046   BUN 19 01/10/2020 1323   BUN 26.0 01/10/2017 1046   CREATININE 1.18 (H) 01/10/2020 1323   CREATININE 1.5 (H) 01/10/2017 1046   CALCIUM 10.5 (H) 01/10/2020 1323   CALCIUM 10.8 (H) 01/10/2017 1046   PROT 6.9 01/10/2020 1323  PROT 7.4 01/10/2017 1046   ALBUMIN 3.5  01/10/2020 1323   ALBUMIN 3.7 01/10/2017 1046   AST 19 01/10/2020 1323   AST 15 01/10/2017 1046   ALT 24 01/10/2020 1323   ALT 14 01/10/2017 1046   ALKPHOS 62 01/10/2020 1323   ALKPHOS 61 01/10/2017 1046   BILITOT 0.5 01/10/2020 1323   BILITOT 0.50 01/10/2017 1046   GFRNONAA 44 (L) 01/10/2020 1323   GFRAA 52 (L) 01/10/2020 1323    I No results found for: SPEP  Lab Results  Component Value Date   WBC 7.9 01/10/2020   NEUTROABS 5.7 01/10/2020   HGB 13.3 01/10/2020   HCT 43.8 01/10/2020   MCV 88.0 01/10/2020   PLT 293 01/10/2020      Chemistry      Component Value Date/Time   NA 134 (L) 01/10/2020 1323   NA 140 01/10/2017 1046   K 4.8 01/10/2020 1323   K 4.6 01/10/2017 1046   CL 102 01/10/2020 1323   CO2 21 (L) 01/10/2020 1323   CO2 25 01/10/2017 1046   BUN 19 01/10/2020 1323   BUN 26.0 01/10/2017 1046   CREATININE 1.18 (H) 01/10/2020 1323   CREATININE 1.5 (H) 01/10/2017 1046      Component Value Date/Time   CALCIUM 10.5 (H) 01/10/2020 1323   CALCIUM 10.8 (H) 01/10/2017 1046   ALKPHOS 62 01/10/2020 1323   ALKPHOS 61 01/10/2017 1046   AST 19 01/10/2020 1323   AST 15 01/10/2017 1046   ALT 24 01/10/2020 1323   ALT 14 01/10/2017 1046   BILITOT 0.5 01/10/2020 1323   BILITOT 0.50 01/10/2017 1046       No results found for: LABCA2  No components found for: LABCA125  No results for input(s): INR in the last 168 hours.  Urinalysis    Component Value Date/Time   COLORURINE YELLOW 12/07/2019 1035   APPEARANCEUR HAZY (A) 12/07/2019 1035   LABSPEC 1.033 (H) 12/07/2019 1035   PHURINE 5.0 12/07/2019 1035   GLUCOSEU NEGATIVE 12/07/2019 1035   HGBUR NEGATIVE 12/07/2019 1035   BILIRUBINUR NEGATIVE 12/07/2019 1035   KETONESUR NEGATIVE 12/07/2019 1035   PROTEINUR NEGATIVE 12/07/2019 1035   UROBILINOGEN 0.2 03/31/2010 1116   NITRITE NEGATIVE 12/07/2019 1035   LEUKOCYTESUR TRACE (A) 12/07/2019 1035    STUDIES: She is behind on mammography  ASSESSMENT: 79 y.o.  [name] status post left breast upper outer quadrant lumpectomy and sentinel lymph node sampling 01/11/2013 for a pT1c pN0, stage IA invasive ductal carcinoma, grade 1, estrogen receptor 100% positive, progesterone receptor 79% positive, with an MIB-1 of 17% and no HER-2 amplification.  (1) Oncotype DX score of 10 predicts an outside the breast risk of recurrence within 10 years of 7% if the patient's only systemic treatment is tamoxifen for 5 years. It also predicts no benefit from chemotherapy.  (2) completed adjuvant radiation December 2014  (3) on anastrozole January through March of 2015, discontinued because of side effects  (4) exemestane started June 2015  (a) bone density 07/22/2012 was normal  (5) status post robotic hysterectomy with bilateral salpingo-oophorectomy 12/14/2012 for a noninvasive grade 1 endometrial cancer   (6) saddle embolus diagnosed 11/29/2015, s/p thrombolysis followed by apixaban as of 12/01/2015.  PLAN: Kei is now 7 years out from definitive surgery for her breast cancer, with no evidence of disease recurrence.  This is very favorable.  She is tolerating exemestane well and the plan is to continue that another year, at which point she may consider "graduating".  I have encouraged her to push on her physical therapy.  Walking is a great goal for her.  Otherwise she knows to call for any other issue that may develop before the next visit  Total encounter time 20 minutes.   Mozelle Remlinger, Virgie Dad, MD  01/10/20 3:23 PM Medical Oncology and Hematology [facility] [address] [place],  [address] Tel. [phone]    Fax. [phone]    This document serves as a record of services personally performed by Lurline Del, MD. It was created on his behalf by Steva Colder, a trained medical scribe. The creation of this record is based on the scribe's personal observations and the provider's statements to them.   I have  reviewed the above documentation for accuracy and completeness, and I agree with the above.

## 2020-01-11 ENCOUNTER — Telehealth: Payer: Self-pay | Admitting: Oncology

## 2020-01-11 ENCOUNTER — Telehealth: Payer: Self-pay | Admitting: Nurse Practitioner

## 2020-01-11 NOTE — Telephone Encounter (Signed)
Phone call to patient to review instructions for 13 hr prep for CT w/ contrast on 01/29/20 at 1020. Prescription faxed with instructions to [facility]. Pt aware and verbalized understanding of instructions. Prescription: 2120- 50mg  Prednisone 0320- 50mg  Prednisone 0920- 50mg  Prednisone and 50mg  Benadryl

## 2020-01-11 NOTE — Telephone Encounter (Signed)
Scheduled appts per 8/19 los. Left voicemail with appt date and time.

## 2020-01-15 ENCOUNTER — Inpatient Hospital Stay: Admission: RE | Admit: 2020-01-15 | Payer: Medicare Other | Source: Ambulatory Visit

## 2020-01-22 ENCOUNTER — Other Ambulatory Visit: Payer: Self-pay | Admitting: Gastroenterology

## 2020-01-22 DIAGNOSIS — R1312 Dysphagia, oropharyngeal phase: Secondary | ICD-10-CM

## 2020-01-24 ENCOUNTER — Other Ambulatory Visit: Payer: Self-pay

## 2020-01-24 ENCOUNTER — Other Ambulatory Visit: Payer: Self-pay | Admitting: Internal Medicine

## 2020-01-24 ENCOUNTER — Ambulatory Visit
Admission: RE | Admit: 2020-01-24 | Discharge: 2020-01-24 | Disposition: A | Discharge status: Home / Self Care | Payer: Medicare Other | Source: Ambulatory Visit | Attending: Internal Medicine | Admitting: Internal Medicine

## 2020-01-24 DIAGNOSIS — C50919 Malignant neoplasm of unspecified site of unspecified female breast: Secondary | ICD-10-CM

## 2020-01-24 DIAGNOSIS — Z1231 Encounter for screening mammogram for malignant neoplasm of breast: Secondary | ICD-10-CM

## 2020-01-29 ENCOUNTER — Ambulatory Visit
Admission: RE | Admit: 2020-01-29 | Discharge: 2020-01-29 | Disposition: A | Discharge status: Home / Self Care | Payer: Medicare Other | Source: Ambulatory Visit | Attending: Internal Medicine | Admitting: Internal Medicine

## 2020-01-29 ENCOUNTER — Other Ambulatory Visit: Payer: Self-pay | Admitting: *Deleted

## 2020-01-29 DIAGNOSIS — J181 Lobar pneumonia, unspecified organism: Secondary | ICD-10-CM

## 2020-01-30 ENCOUNTER — Other Ambulatory Visit: Payer: Self-pay | Admitting: Internal Medicine

## 2020-01-30 DIAGNOSIS — R928 Other abnormal and inconclusive findings on diagnostic imaging of breast: Secondary | ICD-10-CM

## 2020-02-05 ENCOUNTER — Other Ambulatory Visit: Payer: Medicare Other

## 2020-02-12 ENCOUNTER — Other Ambulatory Visit: Payer: Medicare Other

## 2020-02-26 ENCOUNTER — Other Ambulatory Visit: Payer: Medicare Other

## 2020-03-13 ENCOUNTER — Ambulatory Visit: Payer: Medicare Other

## 2020-03-13 ENCOUNTER — Ambulatory Visit
Admission: RE | Admit: 2020-03-13 | Discharge: 2020-03-13 | Disposition: A | Discharge status: Home / Self Care | Payer: Medicare Other | Source: Ambulatory Visit | Attending: Internal Medicine | Admitting: Internal Medicine

## 2020-03-13 ENCOUNTER — Other Ambulatory Visit: Payer: Self-pay

## 2020-03-13 DIAGNOSIS — R928 Other abnormal and inconclusive findings on diagnostic imaging of breast: Secondary | ICD-10-CM

## 2020-04-10 ENCOUNTER — Other Ambulatory Visit: Payer: Medicare Other

## 2020-04-15 ENCOUNTER — Other Ambulatory Visit: Payer: Medicare Other

## 2020-04-24 ENCOUNTER — Other Ambulatory Visit: Payer: Medicare Other

## 2020-07-09 ENCOUNTER — Encounter (HOSPITAL_COMMUNITY): Payer: Self-pay | Admitting: Emergency Medicine

## 2020-07-09 ENCOUNTER — Emergency Department (HOSPITAL_COMMUNITY)
Admission: EM | Admit: 2020-07-09 | Discharge: 2020-07-10 | Disposition: A | Discharge status: Home / Self Care | Payer: Medicare Other | Attending: Emergency Medicine | Admitting: Emergency Medicine

## 2020-07-09 DIAGNOSIS — Z96653 Presence of artificial knee joint, bilateral: Secondary | ICD-10-CM | POA: Insufficient documentation

## 2020-07-09 DIAGNOSIS — N183 Chronic kidney disease, stage 3 unspecified: Secondary | ICD-10-CM | POA: Diagnosis not present

## 2020-07-09 DIAGNOSIS — Z7901 Long term (current) use of anticoagulants: Secondary | ICD-10-CM | POA: Diagnosis not present

## 2020-07-09 DIAGNOSIS — Z923 Personal history of irradiation: Secondary | ICD-10-CM | POA: Insufficient documentation

## 2020-07-09 DIAGNOSIS — Z8542 Personal history of malignant neoplasm of other parts of uterus: Secondary | ICD-10-CM | POA: Diagnosis not present

## 2020-07-09 DIAGNOSIS — Z7984 Long term (current) use of oral hypoglycemic drugs: Secondary | ICD-10-CM | POA: Diagnosis not present

## 2020-07-09 DIAGNOSIS — Z79899 Other long term (current) drug therapy: Secondary | ICD-10-CM | POA: Insufficient documentation

## 2020-07-09 DIAGNOSIS — E1122 Type 2 diabetes mellitus with diabetic chronic kidney disease: Secondary | ICD-10-CM | POA: Diagnosis not present

## 2020-07-09 DIAGNOSIS — E669 Obesity, unspecified: Secondary | ICD-10-CM | POA: Diagnosis not present

## 2020-07-09 DIAGNOSIS — I129 Hypertensive chronic kidney disease with stage 1 through stage 4 chronic kidney disease, or unspecified chronic kidney disease: Secondary | ICD-10-CM | POA: Diagnosis not present

## 2020-07-09 DIAGNOSIS — Z853 Personal history of malignant neoplasm of breast: Secondary | ICD-10-CM | POA: Diagnosis not present

## 2020-07-09 DIAGNOSIS — E1169 Type 2 diabetes mellitus with other specified complication: Secondary | ICD-10-CM | POA: Diagnosis not present

## 2020-07-09 DIAGNOSIS — K5900 Constipation, unspecified: Secondary | ICD-10-CM | POA: Diagnosis present

## 2020-07-09 DIAGNOSIS — K5901 Slow transit constipation: Secondary | ICD-10-CM | POA: Insufficient documentation

## 2020-07-09 LAB — COMPREHENSIVE METABOLIC PANEL
ALT: 17 U/L (ref 0–44)
AST: 17 U/L (ref 15–41)
Albumin: 3.3 g/dL — ABNORMAL LOW (ref 3.5–5.0)
Alkaline Phosphatase: 46 U/L (ref 38–126)
Anion gap: 10 (ref 5–15)
BUN: 23 mg/dL (ref 8–23)
CO2: 26 mmol/L (ref 22–32)
Calcium: 9.9 mg/dL (ref 8.9–10.3)
Chloride: 103 mmol/L (ref 98–111)
Creatinine, Ser: 1.61 mg/dL — ABNORMAL HIGH (ref 0.44–1.00)
GFR, Estimated: 33 mL/min — ABNORMAL LOW (ref >60)
Glucose, Bld: 170 mg/dL — ABNORMAL HIGH (ref 70–99)
Potassium: 4.6 mmol/L (ref 3.5–5.1)
Sodium: 139 mmol/L (ref 135–145)
Total Bilirubin: 0.8 mg/dL (ref 0.3–1.2)
Total Protein: 6.6 g/dL (ref 6.5–8.1)

## 2020-07-09 LAB — CBC WITH DIFFERENTIAL/PLATELET
Abs Immature Granulocytes: 0.15 10*3/uL — ABNORMAL HIGH (ref 0.00–0.07)
Basophils Absolute: 0 10*3/uL (ref 0.0–0.1)
Basophils Relative: 1 %
Eosinophils Absolute: 0.3 10*3/uL (ref 0.0–0.5)
Eosinophils Relative: 4 %
HCT: 46.5 % — ABNORMAL HIGH (ref 36.0–46.0)
Hemoglobin: 13.5 g/dL (ref 12.0–15.0)
Immature Granulocytes: 2 %
Lymphocytes Relative: 21 %
Lymphs Abs: 1.5 10*3/uL (ref 0.7–4.0)
MCH: 26.7 pg (ref 26.0–34.0)
MCHC: 29 g/dL — ABNORMAL LOW (ref 30.0–36.0)
MCV: 92.1 fL (ref 80.0–100.0)
Monocytes Absolute: 0.6 10*3/uL (ref 0.1–1.0)
Monocytes Relative: 9 %
Neutro Abs: 4.8 10*3/uL (ref 1.7–7.7)
Neutrophils Relative %: 63 %
Platelets: 270 10*3/uL (ref 150–400)
RBC: 5.05 MIL/uL (ref 3.87–5.11)
RDW: 14.3 % (ref 11.5–15.5)
WBC: 7.5 10*3/uL (ref 4.0–10.5)
nRBC: 0 % (ref 0.0–0.2)

## 2020-07-09 NOTE — ED Notes (Signed)
Pt told NT that she would think about allowing her to take her vitals. Pt encouraged to allow [place] to check her vital signs to ensure her well-being. Pt uncooperative and demanding that she be taken back to a room first. Unable to obtain vitals at this time.

## 2020-07-09 NOTE — ED Triage Notes (Signed)
Pt arrives from [place] with chief complaint of constipation x 5 days, per pt they did her en enema today but did not help. She is having generalized abd pain. This has been a reoccurring issue for over 1 year.

## 2020-07-10 MED ORDER — POLYETHYLENE GLYCOL 3350 17 G PO PACK: 17.0000 g | PACK | Freq: Two times a day (BID) | ORAL | 0 refills | Status: DC

## 2020-07-10 NOTE — ED Notes (Signed)
[facility] called to transport patient to [place]

## 2020-07-10 NOTE — ED Provider Notes (Signed)
[facility] EMERGENCY DEPARTMENT Provider Note   CSN: [id number] Arrival date & time: 07/09/20  1255     History Chief Complaint  Patient presents with  . Constipation    [name] is a 80 y.o. female.  The history is provided by the patient.  Constipation Severity:  Moderate Time since last bowel movement:  1 week Timing:  Constant Progression:  Worsening Chronicity:  Recurrent Relieved by:  Nothing Worsened by:  Nothing Associated symptoms: no abdominal pain, no fever, no nausea and no vomiting   Patient with extensive history including hypertension, pulmonary embolism, hyperlipidemia, obesity presents with constipation.  Patient reports that for the past week she has had almost no stool output.  No fevers or vomiting.  No abdominal pain.  She reports she has constipation frequently. Patient resides in a nursing facility     Past Medical History:  Diagnosis Date  . Allergy   . Arthritis    "joints; mostly on the right side" (01/11/2013)  . Breast cancer ([place])    "left" (01/11/2013)  . Cancer [facility])    endometrial  . Chronic lower back pain   . Cold    just started 01/02/13 cough  . Complication of anesthesia    very phobic about needles.last surgery had to sedate before doing iv  . Depression   . Exertional shortness of breath   . Hyperlipidemia   . Hypertension   . Osteoporosis    knees   . Personal history of radiation therapy 03/2013   left  . Status post radiation therapy within last four weeks 03/19/13-05/03/13   lt breast 60.4GY  . Uterine cancer [facility])     Patient Active Problem List   Diagnosis Date Noted  . Fecal impaction ([place])   . UTI (urinary tract infection) 05/11/2019  . Constipation   . Acute cystitis 05/10/2019  . CKD (chronic kidney disease), stage III ([place]) 05/10/2019  . Weakness 05/09/2019  . Pulmonary embolus ([place])   . Pulmonary embolus, left ([place])   . Pulmonary embolus, right ([place])   . CKD (chronic kidney  disease), stage I   . Bilateral pulmonary embolism ([place])   . PE (pulmonary embolism)   . Acute kidney injury superimposed on chronic kidney disease ([place]) 11/30/2015  . Depression 11/30/2015  . Essential hypertension 11/30/2015  . Acute respiratory failure with hypoxia ([place]) 11/30/2015  . Diabetes mellitus type 2 in obese ([place]) 11/30/2015  . Pulmonary embolism ([place]) 11/29/2015  . Pulmonary embolism, bilateral ([place]) 11/29/2015  . Pain in lower limb 01/10/2015  . Malignant neoplasm of upper-outer quadrant of left breast in female, estrogen receptor positive ([place]) 01/24/2013  . Edema 01/09/2013  . Onychomycosis 01/09/2013  . Pain in joint, ankle and foot 01/09/2013    Past Surgical History:  Procedure Laterality Date  . ABDOMINAL HYSTERECTOMY  12/14/2012  . BREAST BIOPSY Left 12/2012  . BREAST LUMPECTOMY Left 2014  . BREAST LUMPECTOMY WITH NEEDLE LOCALIZATION AND AXILLARY SENTINEL LYMPH NODE BX Left 01/11/2013  . BREAST LUMPECTOMY WITH NEEDLE LOCALIZATION AND AXILLARY SENTINEL LYMPH NODE BX Left 01/11/2013   Procedure: BREAST LUMPECTOMY WITH NEEDLE LOCALIZATION AND AXILLARY SENTINEL LYMPH NODE BX;  Surgeon: Rolm Bookbinder, MD;  Location: [place];  Service: General;  Laterality: Left;  . CHOLECYSTECTOMY  1970  . DILATION AND CURETTAGE OF UTERUS     "@ least one" (01/11/2013)  . FOOT TENDON SURGERY Right   . KNEE ARTHROSCOPY Right 1990's   "twice" (01/11/2013)  . REPLACEMENT TOTAL  KNEE Bilateral 2011-2012  . TUBAL LIGATION       OB History   No obstetric history on file.     Family History  Problem Relation Age of Onset  . Breast cancer Mother 28  . Esophageal cancer Neg Hx   . Colon cancer Neg Hx   . Stomach cancer Neg Hx   . Rectal cancer Neg Hx     Social History   Tobacco Use  . Smoking status: Never Smoker  . Smokeless tobacco: Never Used  Vaping Use  . Vaping Use: Never used  Substance Use Topics  . Alcohol use: No    Alcohol/week: 0.0 standard drinks  . Drug  use: No    Home Medications Prior to Admission medications   Medication Sig Start Date End Date Taking? Authorizing Provider  acetaminophen (TYLENOL) 650 MG CR tablet Take 1,300 mg by mouth every 8 (eight) hours as needed for pain.     [provider]  apixaban (ELIQUIS) 5 MG TABS tablet Take 5 mg by mouth 2 (two) times daily.    [provider]  bisacodyl (DULCOLAX) 10 MG suppository Place 10 mg rectally daily. 11/29/19   [provider]  buPROPion (WELLBUTRIN SR) 150 MG 12 hr tablet Take 150 mg by mouth 2 (two) times daily. 11/11/15   [provider]  docusate sodium (COLACE) 100 MG capsule Take 1 capsule (100 mg total) by mouth daily. 05/15/19   Georgette Shell, MD  donepezil (ARICEPT) 5 MG tablet Take 5 mg by mouth daily. 11/21/19   [provider]  DULoxetine (CYMBALTA) 20 MG capsule Take 1 capsule (20 mg total) by mouth daily. 08/17/13   Gardenia Phlegm, NP  eszopiclone (LUNESTA) 2 MG TABS tablet Take 2 mg by mouth at bedtime as needed for sleep.  04/05/19   [provider]  exemestane (AROMASIN) 25 MG tablet Take 1 tablet by mouth once daily 06/05/19   Magrinat, Virgie Dad, MD  fenofibrate 160 MG tablet Take 160 mg by mouth daily. 10/02/15   [provider]  gabapentin (NEURONTIN) 300 MG capsule TAKE 1 CAPSULE BY MOUTH AT BEDTIME Patient taking differently: Take 300 mg by mouth 2 (two) times daily.  02/15/18   Magrinat, Virgie Dad, MD  HYDROcodone-acetaminophen (NORCO/VICODIN) 5-325 MG tablet Take 1 tablet by mouth every 8 (eight) hours as needed for moderate pain.  03/30/19   [provider]  Menthol, Topical Analgesic, (BIOFREEZE ROLL-ON) 4 % GEL Apply 1 application topically 2 (two) times daily as needed (heel pain).     [provider]  metFORMIN (GLUCOPHAGE) 500 MG tablet Take 1 tablet (500 mg total) by mouth 2 (two) times daily with a meal. 12/09/15   [name], MD  metoprolol succinate  (TOPROL-XL) 50 MG 24 hr tablet Take 50 mg by mouth 2 (two) times daily.  12/19/12   [provider]  Nutritional Supplements (QUINOA KALE & HEMP PO) Take 5 drops by mouth daily.     [provider]  ondansetron (ZOFRAN) 4 MG tablet Take 1 tablet (4 mg total) by mouth every 6 (six) hours as needed for nausea. 05/15/19   Georgette Shell, MD  polyethylene glycol (MIRALAX / GLYCOLAX) 17 g packet Take 17 g by mouth daily. 05/15/19   Georgette Shell, MD  senna (SENOKOT) 8.6 MG TABS tablet Take 1 tablet (8.6 mg total) by mouth daily. 05/15/19   Georgette Shell, MD  SENNA-PLUS 8.6-50 MG tablet  11/21/19  [provider]  spironolactone (ALDACTONE) 50 MG tablet Take 50 mg by mouth every morning. 04/24/17   [provider]    Allergies    Iodine  Review of Systems   Review of Systems  Constitutional: Negative for fever.  Respiratory: Negative for shortness of breath.   Cardiovascular: Negative for chest pain.  Gastrointestinal: Positive for constipation. Negative for abdominal pain, blood in stool, nausea and vomiting.  All other systems reviewed and are negative.   Physical Exam Updated Vital Signs BP 110/84 (BP Location: Right Arm)   Pulse 98   Temp 98.8 F (37.1 C) (Oral)   Resp 15   SpO2 100%   Physical Exam CONSTITUTIONAL: Elderly, mildly anxious HEAD: Normocephalic/atraumatic EYES: EOMI ENMT: Mucous membranes moist NECK: supple no meningeal signs SPINE/BACK:entire spine nontender CV: S1/S2 noted, no murmurs/rubs/gallops noted LUNGS: Lungs are clear to auscultation bilaterally, no apparent distress ABDOMEN: soft, nontender, no rebound or guarding, bowel sounds noted throughout abdomen, obese.  Old cholecystectomy surgical scar noted GU:no cva tenderness Rectal-stool impaction noted.  No blood or melena.  Stool is brown.  No mass or abscess noted.  Nurse Andria Frames present for exam NEURO: Pt is awake/alert/appropriate, moves all  extremitiesx4.  No facial droop.   EXTREMITIES: pulses normal/equal SKIN: warm, color normal PSYCH: Anxious ED Results / Procedures / Treatments   Labs (all labs ordered are listed, but only abnormal results are displayed) Labs Reviewed  CBC WITH DIFFERENTIAL/PLATELET - Abnormal; Notable for the following components:      Result Value   HCT 46.5 (*)    MCHC 29.0 (*)    Abs Immature Granulocytes 0.15 (*)    All other components within normal limits  COMPREHENSIVE METABOLIC PANEL - Abnormal; Notable for the following components:   Glucose, Bld 170 (*)    Creatinine, Ser 1.61 (*)    Albumin 3.3 (*)    GFR, Estimated 33 (*)    All other components within normal limits    EKG None  Radiology No results found.  Procedures Fecal disimpaction  Date/Time: 07/10/2020 1:20 AM Performed by: Ripley Fraise, MD Authorized by: Ripley Fraise, MD  Consent given by: patient Patient tolerance: patient tolerated the procedure well with no immediate complications Comments: Stool disimpaction performed.  Large amount of stool was extracted, patient tolerated well without any immediate complication      Medications Ordered in ED Medications - No data to display  ED Course  I have reviewed the triage vital signs and the nursing notes.  Pertinent labs  results that were available during my care of the patient were reviewed by me and considered in my medical decision making (see chart for details).    MDM Rules/Calculators/A&P                          Patient presents for nursing facility for constipation.  Apparently an enema was attempted with minimal improvement.  Patient has been taking MiraLAX without relief On my exam patient is in no acute distress other than being mildly anxious.  No focal abdominal tenderness is noted.  Patient was disimpacted with significant improvement. Labs reveal dehydration, patient requests ice water. No indication for imaging at this time 2:41  AM Pt improved Denies pain complaints She is taking PO fluids Low suspicion for bowel obstruction or other acute abdominal emergency Advised to increase miraLAX to BID Final Clinical Impression(s) / ED Diagnoses Final diagnoses:  Slow transit constipation    Rx /  DC Orders ED Discharge Orders         Ordered    polyethylene glycol (MIRALAX / GLYCOLAX) 17 g packet  2 times daily        07/10/20 0239           Ripley Fraise, MD 07/10/20 [id number]

## 2020-07-10 NOTE — ED Notes (Signed)
Pt provided PO fluids per verbal order

## 2021-02-11 ENCOUNTER — Other Ambulatory Visit: Payer: Self-pay | Admitting: Internal Medicine

## 2021-02-11 DIAGNOSIS — E2839 Other primary ovarian failure: Secondary | ICD-10-CM

## 2021-02-11 DIAGNOSIS — Z853 Personal history of malignant neoplasm of breast: Secondary | ICD-10-CM

## 2021-02-20 ENCOUNTER — Other Ambulatory Visit: Payer: Self-pay | Admitting: *Deleted

## 2021-02-20 DIAGNOSIS — C50412 Malignant neoplasm of upper-outer quadrant of left female breast: Secondary | ICD-10-CM

## 2021-02-20 DIAGNOSIS — Z170 Estrogen receptor positive status [ER+]: Secondary | ICD-10-CM

## 2021-02-22 NOTE — Progress Notes (Signed)
[name]  Telephone:(336) [phone] Fax:(336) [phone]    ID: SABA GOMM OB: [birth year]  MR#: [id number]  CWC#:[id number]  Patient Care Team: Willey Blade, MD as PCP - General (Internal Medicine) Alda Berthold, DO as Consulting Physician (Neurology) Mauri Pole, MD as Consulting Physician (Gastroenterology) Abbigayle Toole, Virgie Dad, MD as Consulting Physician (Oncology) OTHER MD:   CHIEF COMPLAINT: Early stage breast cancer   TREATMENT: Exemestane   INTERVAL HISTORY: [name] was scheduledd today for follow-up of her estrogen receptor positive breast cancer.  However she did not show   Since her last visit, she had routine screening mammography on 01/24/2020 showing a possible abnormality in the left breast. She underwent left diagnostic mammography with tomography at [facility] on 03/13/2020 showing: breast density category C; no evidence of malignancy.  She is scheduled for annual screening mammogram on 03/19/2021, and for bone density screening on 07/30/2021.   REVIEW OF SYSTEMS: [name]    COVID 19 VACCINATION STATUS: x2   BREAST CANCER HISTORY: From Dr Jenny Reichmann Moody's initial consult note 01/29/2013:  "[name] is a 80 y.o. female who is seen for an initial consultation visit. The patient has had multiple medical issues recently. By report the patient proceeded with a hysterectomy/BSO for endometrial cancer recently at [facility]. The patient did have some difficulty recovering from this and was in a skilled nursing facility for a short period of time. She then thought that she felt a possible mass within the left breast and she sought further evaluation for this. The patient proceeded with a mammogram and ultrasound which corresponded to suspicious findings.a core biopsy was obtained and this returned positive for invasive ductal carcinoma. Receptor studies have indicated that the tumor is ER positive, PR positive, and  HER-2/neu negative. She then proceeded to undergo an MRI scan of the breasts bilaterally on 12/26/2012. This revealed a 1.9 cm irregular enhancing mass within the left breast at the 12:00 position. This corresponded to the recently diagnosed invasive mammary carcinoma.   The patient was seen by Dr. Donne Hazel and she proceeded to perform a lumpectomy and sentinel lymph node evaluation on 01/11/2013. The patient's pathology returned positive for an invasive ductal carcinoma measuring 1.9 cm. The margins were negative with the closest margin being 1 cm. The sentinel lymph nodes were negative. This therefore represented a T1 C. N0 M0 tumor. "  Her subsequent history is as detailed below   PAST MEDICAL HISTORY: Past Medical History:  Diagnosis Date   Allergy    Arthritis    "joints; mostly on the right side" (01/11/2013)   Breast cancer ([place])    "left" (01/11/2013)   Cancer ([place])    endometrial   Chronic lower back pain    Cold    just started 7/61/60 cough   Complication of anesthesia    very phobic about needles.last surgery had to sedate before doing iv   Depression    Exertional shortness of breath    Hyperlipidemia    Hypertension    Osteoporosis    knees    Personal history of radiation therapy 03/2013   left   Status post radiation therapy within last four weeks 03/19/13-05/03/13   lt breast 60.4GY   Uterine cancer [facility])     PAST SURGICAL HISTORY: Past Surgical History:  Procedure Laterality Date   ABDOMINAL HYSTERECTOMY  12/14/2012   BREAST BIOPSY Left 12/2012   BREAST LUMPECTOMY Left 2014   BREAST LUMPECTOMY WITH NEEDLE LOCALIZATION AND AXILLARY  SENTINEL LYMPH NODE BX Left 01/11/2013   BREAST LUMPECTOMY WITH NEEDLE LOCALIZATION AND AXILLARY SENTINEL LYMPH NODE BX Left 01/11/2013   Procedure: BREAST LUMPECTOMY WITH NEEDLE LOCALIZATION AND AXILLARY SENTINEL LYMPH NODE BX;  Surgeon: Rolm Bookbinder, MD;  Location: [place];  Service: General;  Laterality: Left;   [place]     "@ least one" (01/11/2013)   FOOT TENDON SURGERY Right    KNEE ARTHROSCOPY Right 1990's   "twice" (01/11/2013)   REPLACEMENT TOTAL KNEE Bilateral 2011-2012   TUBAL LIGATION      FAMILY HISTORY Family History  Problem Relation Age of Onset   Breast cancer Mother 65   Esophageal cancer Neg Hx    Colon cancer Neg Hx    Stomach cancer Neg Hx    Rectal cancer Neg Hx    the patient does not know the cause of death of either parent. She was a foster child and really knows very little about her family. She has some half sisters that she is aware of, but does not know them very well.    GYNECOLOGIC HISTORY:   menarche age 32, first live birth age 37, the patient is [name] P2. She went through the change of life around the age of 69. She took hormone replacement through the women's health study. 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 She is divorced, and currently lives in assisted living at [facility].  Daughter [name] unfortunately died, sudden death, cause unclear. Son [name] lives in [place] and has a Designer, jewellery in psychology. The patient has no grandchildren. She is not a Ambulance person.    ADVANCED DIRECTIVES: Not in place but the patient intends to name her son as her healthcare power of attorney. He can be reached at [phone]. At the 10/10/2013 visit the patient was given the appropriate documents to complete and notarize at her discretion   HEALTH MAINTENANCE: Social History   Tobacco Use   Smoking status: Never   Smokeless tobacco: Never  Vaping Use   Vaping Use: Never used  Substance Use Topics   Alcohol use: No    Alcohol/week: 0.0 standard drinks   Drug use: No     Colonoscopy:  PAP: Status post hysterectomy and bilateral salpingo-oophorectomy   Bone density: April 2014, showed minimal osteopenia, with a T score of -1.1  Lipid  panel:  Allergies  Allergen Reactions   Iodine Rash    Current Outpatient Medications  Medication Sig Dispense Refill   acetaminophen (TYLENOL) 650 MG CR tablet Take 1,300 mg by mouth every 8 (eight) hours as needed for pain.      apixaban (ELIQUIS) 5 MG TABS tablet Take 5 mg by mouth 2 (two) times daily.     bisacodyl (DULCOLAX) 10 MG suppository Place 10 mg rectally daily.     buPROPion (WELLBUTRIN SR) 150 MG 12 hr tablet Take 150 mg by mouth 2 (two) times daily.     docusate sodium (COLACE) 100 MG capsule Take 1 capsule (100 mg total) by mouth daily. 10 capsule 0   donepezil (ARICEPT) 5 MG tablet Take 5 mg by mouth daily.     DULoxetine (CYMBALTA) 20 MG capsule Take 1 capsule (20 mg total) by mouth daily. 30 capsule 3   eszopiclone (LUNESTA) 2 MG TABS tablet Take 2 mg by mouth at bedtime as needed for sleep.  exemestane (AROMASIN) 25 MG tablet Take 1 tablet by mouth once daily 90 tablet 0   fenofibrate 160 MG tablet Take 160 mg by mouth daily.     gabapentin (NEURONTIN) 300 MG capsule TAKE 1 CAPSULE BY MOUTH AT BEDTIME (Patient taking differently: Take 300 mg by mouth 2 (two) times daily. ) 90 capsule 4   Menthol, Topical Analgesic, (BIOFREEZE ROLL-ON) 4 % GEL Apply 1 application topically 2 (two) times daily as needed (heel pain).      metFORMIN (GLUCOPHAGE) 500 MG tablet Take 1 tablet (500 mg total) by mouth 2 (two) times daily with a meal. 60 tablet 0   metoprolol succinate (TOPROL-XL) 50 MG 24 hr tablet Take 50 mg by mouth 2 (two) times daily.      Nutritional Supplements (QUINOA KALE & HEMP PO) Take 5 drops by mouth daily.      ondansetron (ZOFRAN) 4 MG tablet Take 1 tablet (4 mg total) by mouth every 6 (six) hours as needed for nausea. 20 tablet 0   polyethylene glycol (MIRALAX / GLYCOLAX) 17 g packet Take 17 g by mouth 2 (two) times daily. 14 each 0   senna (SENOKOT) 8.6 MG TABS tablet Take 1 tablet (8.6 mg total) by mouth daily. 120 tablet 0   SENNA-PLUS 8.6-50 MG tablet       spironolactone (ALDACTONE) 50 MG tablet Take 50 mg by mouth every morning.     No current facility-administered medications for this visit.    OBJECTIVE: African-American woman examined in a wheelchair  There were no vitals filed for this visit.    There is no height or weight on file to calculate BMI.    ECOG FS:2 - Symptomatic, <50% confined to bed      LAB RESULTS:  CMP     Component Value Date/Time   NA 139 07/09/2020 1307   NA 140 01/10/2017 1046   K 4.6 07/09/2020 1307   K 4.6 01/10/2017 1046   CL 103 07/09/2020 1307   CO2 26 07/09/2020 1307   CO2 25 01/10/2017 1046   GLUCOSE 170 (H) 07/09/2020 1307   GLUCOSE 116 01/10/2017 1046   BUN 23 07/09/2020 1307   BUN 26.0 01/10/2017 1046   CREATININE 1.61 (H) 07/09/2020 1307   CREATININE 1.5 (H) 01/10/2017 1046   CALCIUM 9.9 07/09/2020 1307   CALCIUM 10.8 (H) 01/10/2017 1046   PROT 6.6 07/09/2020 1307   PROT 7.4 01/10/2017 1046   ALBUMIN 3.3 (L) 07/09/2020 1307   ALBUMIN 3.7 01/10/2017 1046   AST 17 07/09/2020 1307   AST 15 01/10/2017 1046   ALT 17 07/09/2020 1307   ALT 14 01/10/2017 1046   ALKPHOS 46 07/09/2020 1307   ALKPHOS 61 01/10/2017 1046   BILITOT 0.8 07/09/2020 1307   BILITOT 0.50 01/10/2017 1046   GFRNONAA 33 (L) 07/09/2020 1307   GFRAA 52 (L) 01/10/2020 1323    I No results found for: SPEP  Lab Results  Component Value Date   WBC 7.5 07/09/2020   NEUTROABS 4.8 07/09/2020   HGB 13.5 07/09/2020   HCT 46.5 (H) 07/09/2020   MCV 92.1 07/09/2020   PLT 270 07/09/2020      Chemistry      Component Value Date/Time   NA 139 07/09/2020 1307   NA 140 01/10/2017 1046   K 4.6 07/09/2020 1307   K 4.6 01/10/2017 1046   CL 103 07/09/2020 1307   CO2 26 07/09/2020 1307   CO2 25 01/10/2017 1046   BUN 23  07/09/2020 1307   BUN 26.0 01/10/2017 1046   CREATININE 1.61 (H) 07/09/2020 1307   CREATININE 1.5 (H) 01/10/2017 1046      Component Value Date/Time   CALCIUM 9.9 07/09/2020 1307   CALCIUM 10.8  (H) 01/10/2017 1046   ALKPHOS 46 07/09/2020 1307   ALKPHOS 61 01/10/2017 1046   AST 17 07/09/2020 1307   AST 15 01/10/2017 1046   ALT 17 07/09/2020 1307   ALT 14 01/10/2017 1046   BILITOT 0.8 07/09/2020 1307   BILITOT 0.50 01/10/2017 1046       No results found for: LABCA2  No components found for: LABCA125  No results for input(s): INR in the last 168 hours.  Urinalysis    Component Value Date/Time   COLORURINE YELLOW 12/07/2019 1035   APPEARANCEUR HAZY (A) 12/07/2019 1035   LABSPEC 1.033 (H) 12/07/2019 1035   PHURINE 5.0 12/07/2019 1035   GLUCOSEU NEGATIVE 12/07/2019 1035   HGBUR NEGATIVE 12/07/2019 1035   BILIRUBINUR NEGATIVE 12/07/2019 1035   KETONESUR NEGATIVE 12/07/2019 1035   PROTEINUR NEGATIVE 12/07/2019 1035   UROBILINOGEN 0.2 03/31/2010 1116   NITRITE NEGATIVE 12/07/2019 1035   LEUKOCYTESUR TRACE (A) 12/07/2019 1035    STUDIES: No results found.   ASSESSMENT: 80 y.o. [place] woman status post left breast upper outer quadrant lumpectomy and sentinel lymph node sampling 01/11/2013 for a pT1c pN0, stage IA invasive ductal carcinoma, grade 1, estrogen receptor 100% positive, progesterone receptor 79% positive, with an MIB-1 of 17% and no HER-2 amplification.  (1) Oncotype DX score of 10 predicts an outside the breast risk of recurrence within 10 years of 7% if the patient's only systemic treatment is tamoxifen for 5 years. It also predicts no benefit from chemotherapy.  (2) completed adjuvant radiation December 2014  (3) on anastrozole January through March of 2015, discontinued because of side effects  (4) exemestane started June 2015  (a) bone density 07/22/2012 was normal  (5) status post robotic hysterectomy with bilateral salpingo-oophorectomy 12/14/2012 for a noninvasive grade 1 endometrial cancer   (6) saddle embolus diagnosed 11/29/2015, s/p thrombolysis followed by apixaban as of 12/01/2015.   PLAN: [name] did not show for her 02/23/2021  appointment.  A follow-up letter has been sent  Berniece Abid, Virgie Dad, MD  02/22/21 8:32 PM Medical Oncology and Hematology [facility] [place], [place] [address] Tel. [phone]    Fax. [phone]    I, Wilburn Mylar, am acting as scribe for Dr. Virgie Dad. Evagelia Knack.   I, Lurline Del MD, have reviewed the above documentation for accuracy and completeness, and I agree with the above.    *Total Encounter Time as defined by the Centers for Medicare and Medicaid Services includes, in addition to the face-to-face time of a patient visit (documented in the note above) non-face-to-face time: obtaining and reviewing outside history, ordering and reviewing medications, tests or procedures, care coordination (communications with other health care professionals or caregivers) and documentation in the medical record.

## 2021-02-23 ENCOUNTER — Inpatient Hospital Stay: Payer: Medicare Other

## 2021-02-23 ENCOUNTER — Encounter: Payer: Self-pay | Admitting: Oncology

## 2021-02-23 ENCOUNTER — Inpatient Hospital Stay: Payer: Medicare Other | Attending: Oncology | Admitting: Oncology

## 2021-02-23 DIAGNOSIS — C50412 Malignant neoplasm of upper-outer quadrant of left female breast: Secondary | ICD-10-CM

## 2021-02-23 DIAGNOSIS — Z170 Estrogen receptor positive status [ER+]: Secondary | ICD-10-CM

## 2021-03-02 ENCOUNTER — Telehealth: Payer: Self-pay | Admitting: Oncology

## 2021-03-02 NOTE — Telephone Encounter (Signed)
Rescheduled per 10/7 sch mag, msg was left on pts voicemail

## 2021-03-10 ENCOUNTER — Inpatient Hospital Stay: Payer: Medicare Other

## 2021-03-10 ENCOUNTER — Encounter: Payer: Self-pay | Admitting: Oncology

## 2021-03-10 ENCOUNTER — Inpatient Hospital Stay (HOSPITAL_BASED_OUTPATIENT_CLINIC_OR_DEPARTMENT_OTHER): Payer: Medicare Other | Admitting: Oncology

## 2021-03-10 DIAGNOSIS — Z170 Estrogen receptor positive status [ER+]: Secondary | ICD-10-CM

## 2021-03-10 DIAGNOSIS — C50412 Malignant neoplasm of upper-outer quadrant of left female breast: Secondary | ICD-10-CM

## 2021-03-10 NOTE — Progress Notes (Unsigned)
[name] did not show for her 02/23/2021 appointment.  She also did not show for her 03/10/2021 appointment.  A follow-up letter has been sent.

## 2021-03-10 NOTE — Progress Notes (Signed)
[name]  [phone]) [phone] Fax:(336) [phone]    ID: [name] OB: 1940/06/14  MR#: [id number]  CZY#:[id number]  Patient Care Team: Willey Blade, MD as PCP - General (Internal Medicine) Alda Berthold, DO as Consulting Physician (Neurology) Mauri Pole, MD as Consulting Physician (Gastroenterology) Celisa Schoenberg, Virgie Dad, MD as Consulting Physician (Oncology) OTHER MD:   CHIEF COMPLAINT: Early stage breast cancer   TREATMENT: Exemestane   INTERVAL HISTORY: [name] was scheduled today for follow-up of her estrogen receptor positive breast cancer.  However she did not show.  She also did not show for her 02/23/2021 appoint      REVIEW OF SYSTEMS: [name]    COVID 19 VACCINATION STATUS: x2   BREAST CANCER HISTORY: From Dr [name]'s initial consult note 01/29/2013:  "[name] is a 80 y.o. female who is seen for an initial consultation visit. The patient has had multiple medical issues recently. By report the patient proceeded with a hysterectomy/BSO for endometrial cancer recently at [facility]. The patient did have some difficulty recovering from this and was in a skilled nursing facility for a short period of time. She then thought that she felt a possible mass within the left breast and she sought further evaluation for this. The patient proceeded with a mammogram and ultrasound which corresponded to suspicious findings.a core biopsy was obtained and this returned positive for invasive ductal carcinoma. Receptor studies have indicated that the tumor is ER positive, PR positive, and HER-2/neu negative. She then proceeded to undergo an MRI scan of the breasts bilaterally on 12/26/2012. This revealed a 1.9 cm irregular enhancing mass within the left breast at the 12:00 position. This corresponded to the recently diagnosed invasive mammary carcinoma.   The patient was seen by Dr. Donne Hazel and she proceeded to perform a  lumpectomy and sentinel lymph node evaluation on 01/11/2013. The patient's pathology returned positive for an invasive ductal carcinoma measuring 1.9 cm. The margins were negative with the closest margin being 1 cm. The sentinel lymph nodes were negative. This therefore represented a T1 C. N0 M0 tumor. "  Her subsequent history is as detailed below   PAST MEDICAL HISTORY: Past Medical History:  Diagnosis Date   Allergy    Arthritis    "joints; mostly on the right side" (01/11/2013)   Breast cancer ([name])    "left" (01/11/2013)   Cancer ([name])    endometrial   Chronic lower back pain    Cold    just started 0/93/23 cough   Complication of anesthesia    very phobic about needles.last surgery had to sedate before doing iv   Depression    Exertional shortness of breath    Hyperlipidemia    Hypertension    Osteoporosis    knees    Personal history of radiation therapy 03/2013   left   Status post radiation therapy within last four weeks 03/19/13-05/03/13   lt breast 60.4GY   Uterine cancer ([name])     PAST SURGICAL HISTORY: Past Surgical History:  Procedure Laterality Date   ABDOMINAL HYSTERECTOMY  12/14/2012   BREAST BIOPSY Left 12/2012   BREAST LUMPECTOMY Left 2014   BREAST LUMPECTOMY WITH NEEDLE LOCALIZATION AND AXILLARY SENTINEL LYMPH NODE BX Left 01/11/2013   BREAST LUMPECTOMY WITH NEEDLE LOCALIZATION AND AXILLARY SENTINEL LYMPH NODE BX Left 01/11/2013   Procedure: BREAST LUMPECTOMY WITH NEEDLE LOCALIZATION AND AXILLARY SENTINEL LYMPH NODE BX;  Surgeon: Rolm Bookbinder, MD;  Location: [facility];  Service: General;  Laterality: Left;   CHOLECYSTECTOMY  1970   DILATION AND CURETTAGE OF UTERUS     "@ least one" (01/11/2013)   FOOT TENDON SURGERY Right    KNEE ARTHROSCOPY Right 1990's   "twice" (01/11/2013)   REPLACEMENT TOTAL KNEE Bilateral 2011-2012   TUBAL LIGATION      FAMILY HISTORY Family History  Problem Relation Age of Onset   Breast cancer Mother 77   Esophageal cancer  Neg Hx    Colon cancer Neg Hx    Stomach cancer Neg Hx    Rectal cancer Neg Hx    the patient does not know the cause of death of either parent. She was a foster child and really knows very little about her family. She has some half sisters that she is aware of, but does not know them very well.    GYNECOLOGIC HISTORY:   menarche age 35, first live birth age 81, the patient is [place] P2. She went through the change of life around the age of 74. She took hormone replacement through the women's health study. 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 She is divorced, and currently lives in assisted living at [facility] [place]  Daughter [name] unfortunately died, sudden death, cause unclear. Son [name] lives in [place] and has a Designer, jewellery in psychology. The patient has no grandchildren. She is not a Ambulance person.    ADVANCED DIRECTIVES: Not in place but the patient intends to name her son as her healthcare power of attorney. He can be reached at [phone]. At the 10/10/2013 visit the patient was given the appropriate documents to complete and notarize at her discretion   HEALTH MAINTENANCE: Social History   Tobacco Use   Smoking status: Never   Smokeless tobacco: Never  Vaping Use   Vaping Use: Never used  Substance Use Topics   Alcohol use: No    Alcohol/week: 0.0 standard drinks   Drug use: No     Colonoscopy:  PAP: Status post hysterectomy and bilateral salpingo-oophorectomy   Bone density: April 2014, showed minimal osteopenia, with a T score of -1.1  Lipid panel:  Allergies  Allergen Reactions   Iodine Rash    Current Outpatient Medications  Medication Sig Dispense Refill   acetaminophen (TYLENOL) 650 MG CR tablet Take 1,300 mg by mouth every 8 (eight) hours as needed for pain.      apixaban (ELIQUIS) 5 MG TABS tablet Take 5 mg by mouth 2 (two) times daily.     bisacodyl (DULCOLAX)  10 MG suppository Place 10 mg rectally daily.     buPROPion (WELLBUTRIN SR) 150 MG 12 hr tablet Take 150 mg by mouth 2 (two) times daily.     docusate sodium (COLACE) 100 MG capsule Take 1 capsule (100 mg total) by mouth daily. 10 capsule 0   donepezil (ARICEPT) 5 MG tablet Take 5 mg by mouth daily.     DULoxetine (CYMBALTA) 20 MG capsule Take 1 capsule (20 mg total) by mouth daily. 30 capsule 3   eszopiclone (LUNESTA) 2 MG TABS tablet Take 2 mg by mouth at bedtime as needed for sleep.      exemestane (AROMASIN) 25 MG tablet Take 1 tablet by mouth once daily 90 tablet 0   fenofibrate 160 MG tablet Take 160 mg by mouth daily.     gabapentin (NEURONTIN) 300 MG capsule TAKE 1 CAPSULE BY MOUTH AT BEDTIME (Patient taking  differently: Take 300 mg by mouth 2 (two) times daily. ) 90 capsule 4   Menthol, Topical Analgesic, (BIOFREEZE ROLL-ON) 4 % GEL Apply 1 application topically 2 (two) times daily as needed (heel pain).      metFORMIN (GLUCOPHAGE) 500 MG tablet Take 1 tablet (500 mg total) by mouth 2 (two) times daily with a meal. 60 tablet 0   metoprolol succinate (TOPROL-XL) 50 MG 24 hr tablet Take 50 mg by mouth 2 (two) times daily.      Nutritional Supplements (QUINOA KALE & HEMP PO) Take 5 drops by mouth daily.      ondansetron (ZOFRAN) 4 MG tablet Take 1 tablet (4 mg total) by mouth every 6 (six) hours as needed for nausea. 20 tablet 0   polyethylene glycol (MIRALAX / GLYCOLAX) 17 g packet Take 17 g by mouth 2 (two) times daily. 14 each 0   senna (SENOKOT) 8.6 MG TABS tablet Take 1 tablet (8.6 mg total) by mouth daily. 120 tablet 0   SENNA-PLUS 8.6-50 MG tablet      spironolactone (ALDACTONE) 50 MG tablet Take 50 mg by mouth every morning.     No current facility-administered medications for this visit.    OBJECTIVE:    There were no vitals filed for this visit.    There is no height or weight on file to calculate BMI.    ECOG FS:2 - Symptomatic, <50% confined to bed    LAB  RESULTS:  CMP     Component Value Date/Time   NA 139 07/09/2020 1307   NA 140 01/10/2017 1046   K 4.6 07/09/2020 1307   K 4.6 01/10/2017 1046   CL 103 07/09/2020 1307   CO2 26 07/09/2020 1307   CO2 25 01/10/2017 1046   GLUCOSE 170 (H) 07/09/2020 1307   GLUCOSE 116 01/10/2017 1046   BUN 23 07/09/2020 1307   BUN 26.0 01/10/2017 1046   CREATININE 1.61 (H) 07/09/2020 1307   CREATININE 1.5 (H) 01/10/2017 1046   CALCIUM 9.9 07/09/2020 1307   CALCIUM 10.8 (H) 01/10/2017 1046   PROT 6.6 07/09/2020 1307   PROT 7.4 01/10/2017 1046   ALBUMIN 3.3 (L) 07/09/2020 1307   ALBUMIN 3.7 01/10/2017 1046   AST 17 07/09/2020 1307   AST 15 01/10/2017 1046   ALT 17 07/09/2020 1307   ALT 14 01/10/2017 1046   ALKPHOS 46 07/09/2020 1307   ALKPHOS 61 01/10/2017 1046   BILITOT 0.8 07/09/2020 1307   BILITOT 0.50 01/10/2017 1046   GFRNONAA 33 (L) 07/09/2020 1307   GFRAA 52 (L) 01/10/2020 1323    I No results found for: SPEP  Lab Results  Component Value Date   WBC 7.5 07/09/2020   NEUTROABS 4.8 07/09/2020   HGB 13.5 07/09/2020   HCT 46.5 (H) 07/09/2020   MCV 92.1 07/09/2020   PLT 270 07/09/2020      Chemistry      Component Value Date/Time   NA 139 07/09/2020 1307   NA 140 01/10/2017 1046   K 4.6 07/09/2020 1307   K 4.6 01/10/2017 1046   CL 103 07/09/2020 1307   CO2 26 07/09/2020 1307   CO2 25 01/10/2017 1046   BUN 23 07/09/2020 1307   BUN 26.0 01/10/2017 1046   CREATININE 1.61 (H) 07/09/2020 1307   CREATININE 1.5 (H) 01/10/2017 1046      Component Value Date/Time   CALCIUM 9.9 07/09/2020 1307   CALCIUM 10.8 (H) 01/10/2017 1046   ALKPHOS 46 07/09/2020 1307  ALKPHOS 61 01/10/2017 1046   AST 17 07/09/2020 1307   AST 15 01/10/2017 1046   ALT 17 07/09/2020 1307   ALT 14 01/10/2017 1046   BILITOT 0.8 07/09/2020 1307   BILITOT 0.50 01/10/2017 1046       No results found for: LABCA2  No components found for: LABCA125  No results for input(s): INR in the last 168  hours.  Urinalysis    Component Value Date/Time   COLORURINE YELLOW 12/07/2019 1035   APPEARANCEUR HAZY (A) 12/07/2019 1035   LABSPEC 1.033 (H) 12/07/2019 1035   PHURINE 5.0 12/07/2019 1035   GLUCOSEU NEGATIVE 12/07/2019 1035   HGBUR NEGATIVE 12/07/2019 1035   BILIRUBINUR NEGATIVE 12/07/2019 1035   KETONESUR NEGATIVE 12/07/2019 1035   PROTEINUR NEGATIVE 12/07/2019 1035   UROBILINOGEN 0.2 03/31/2010 1116   NITRITE NEGATIVE 12/07/2019 1035   LEUKOCYTESUR TRACE (A) 12/07/2019 1035    STUDIES: No results found.   ASSESSMENT: 80 y.o.  woman status post left breast upper outer quadrant lumpectomy and sentinel lymph node sampling 01/11/2013 for a pT1c pN0, stage IA invasive ductal carcinoma, grade 1, estrogen receptor 100% positive, progesterone receptor 79% positive, with an MIB-1 of 17% and no HER-2 amplification.  (1) Oncotype DX score of 10 predicts an outside the breast risk of recurrence within 10 years of 7% if the patient's only systemic treatment is tamoxifen for 5 years. It also predicts no benefit from chemotherapy.  (2) completed adjuvant radiation December 2014  (3) on anastrozole January through March of 2015, discontinued because of side effects  (4) exemestane started June 2015  (a) bone density 07/22/2012 was normal  (5) status post robotic hysterectomy with bilateral salpingo-oophorectomy 12/14/2012 for a noninvasive grade 1 endometrial cancer   (6) saddle embolus diagnosed 11/29/2015, s/p thrombolysis followed by apixaban as of 12/01/2015.   PLAN: Yeimi did not show for her 02/23/2021 appointment.  She also did not show for her 03/10/2021 appointment A follow-up letter has been sent   Abygale Karpf, Virgie Dad, MD  03/10/21 5:51 PM Medical Oncology and Hematology [facility] [place], [place] [address] Tel. [phone]    Fax. [phone]    I, Wilburn Mylar, am acting as scribe for Dr. Virgie Dad. Sherri Mcarthy.  I,  Lurline Del MD, have reviewed the above documentation for accuracy and completeness, and I agree with the above.   *Total Encounter Time as defined by the Centers for Medicare and Medicaid Services includes, in addition to the face-to-face time of a patient visit (documented in the note above) non-face-to-face time: obtaining and reviewing outside history, ordering and reviewing medications, tests or procedures, care coordination (communications with other health care professionals or caregivers) and documentation in the medical record.

## 2021-03-19 ENCOUNTER — Other Ambulatory Visit: Payer: Self-pay | Admitting: Internal Medicine

## 2021-03-19 ENCOUNTER — Ambulatory Visit
Admission: RE | Admit: 2021-03-19 | Discharge: 2021-03-19 | Disposition: A | Discharge status: Home / Self Care | Payer: Medicare Other | Source: Ambulatory Visit | Attending: Internal Medicine | Admitting: Internal Medicine

## 2021-03-19 ENCOUNTER — Ambulatory Visit: Payer: Medicare Other

## 2021-03-19 ENCOUNTER — Other Ambulatory Visit: Payer: Self-pay

## 2021-03-19 DIAGNOSIS — N6320 Unspecified lump in the left breast, unspecified quadrant: Secondary | ICD-10-CM

## 2021-03-19 DIAGNOSIS — Z853 Personal history of malignant neoplasm of breast: Secondary | ICD-10-CM

## 2021-06-04 ENCOUNTER — Ambulatory Visit
Admission: RE | Admit: 2021-06-04 | Discharge: 2021-06-04 | Disposition: A | Discharge status: Home / Self Care | Payer: Medicare Other | Source: Ambulatory Visit | Attending: Internal Medicine | Admitting: Internal Medicine

## 2021-06-04 ENCOUNTER — Other Ambulatory Visit: Payer: Self-pay | Admitting: Internal Medicine

## 2021-06-04 DIAGNOSIS — N6320 Unspecified lump in the left breast, unspecified quadrant: Secondary | ICD-10-CM

## 2021-06-06 ENCOUNTER — Inpatient Hospital Stay (HOSPITAL_COMMUNITY)
Admission: EM | Admit: 2021-06-06 | Discharge: 2021-06-19 | DRG: 515 | Disposition: A | Discharge status: Skilled Nursing Facility | Payer: Medicare Other | Attending: Internal Medicine | Admitting: Internal Medicine

## 2021-06-06 ENCOUNTER — Other Ambulatory Visit: Payer: Self-pay

## 2021-06-06 ENCOUNTER — Emergency Department (HOSPITAL_COMMUNITY): Payer: Medicare Other

## 2021-06-06 ENCOUNTER — Encounter (HOSPITAL_COMMUNITY): Payer: Self-pay

## 2021-06-06 DIAGNOSIS — M8448XA Pathological fracture, other site, initial encounter for fracture: Principal | ICD-10-CM | POA: Diagnosis present

## 2021-06-06 DIAGNOSIS — G9341 Metabolic encephalopathy: Secondary | ICD-10-CM | POA: Diagnosis not present

## 2021-06-06 DIAGNOSIS — Z79899 Other long term (current) drug therapy: Secondary | ICD-10-CM

## 2021-06-06 DIAGNOSIS — N1832 Chronic kidney disease, stage 3b: Secondary | ICD-10-CM | POA: Diagnosis present

## 2021-06-06 DIAGNOSIS — I129 Hypertensive chronic kidney disease with stage 1 through stage 4 chronic kidney disease, or unspecified chronic kidney disease: Secondary | ICD-10-CM | POA: Diagnosis present

## 2021-06-06 DIAGNOSIS — L89312 Pressure ulcer of right buttock, stage 2: Secondary | ICD-10-CM | POA: Diagnosis present

## 2021-06-06 DIAGNOSIS — E1122 Type 2 diabetes mellitus with diabetic chronic kidney disease: Secondary | ICD-10-CM | POA: Diagnosis present

## 2021-06-06 DIAGNOSIS — R29898 Other symptoms and signs involving the musculoskeletal system: Secondary | ICD-10-CM

## 2021-06-06 DIAGNOSIS — Z6841 Body Mass Index (BMI) 40.0 and over, adult: Secondary | ICD-10-CM

## 2021-06-06 DIAGNOSIS — I10 Essential (primary) hypertension: Secondary | ICD-10-CM | POA: Diagnosis present

## 2021-06-06 DIAGNOSIS — Z7984 Long term (current) use of oral hypoglycemic drugs: Secondary | ICD-10-CM

## 2021-06-06 DIAGNOSIS — Z86718 Personal history of other venous thrombosis and embolism: Secondary | ICD-10-CM

## 2021-06-06 DIAGNOSIS — J9601 Acute respiratory failure with hypoxia: Secondary | ICD-10-CM

## 2021-06-06 DIAGNOSIS — Z01818 Encounter for other preprocedural examination: Secondary | ICD-10-CM

## 2021-06-06 DIAGNOSIS — Z79811 Long term (current) use of aromatase inhibitors: Secondary | ICD-10-CM

## 2021-06-06 DIAGNOSIS — E662 Morbid (severe) obesity with alveolar hypoventilation: Secondary | ICD-10-CM | POA: Diagnosis present

## 2021-06-06 DIAGNOSIS — Z5309 Procedure and treatment not carried out because of other contraindication: Secondary | ICD-10-CM | POA: Diagnosis present

## 2021-06-06 DIAGNOSIS — Z993 Dependence on wheelchair: Secondary | ICD-10-CM

## 2021-06-06 DIAGNOSIS — E875 Hyperkalemia: Secondary | ICD-10-CM | POA: Diagnosis present

## 2021-06-06 DIAGNOSIS — E785 Hyperlipidemia, unspecified: Secondary | ICD-10-CM | POA: Diagnosis present

## 2021-06-06 DIAGNOSIS — Z888 Allergy status to other drugs, medicaments and biological substances status: Secondary | ICD-10-CM

## 2021-06-06 DIAGNOSIS — K219 Gastro-esophageal reflux disease without esophagitis: Secondary | ICD-10-CM | POA: Diagnosis present

## 2021-06-06 DIAGNOSIS — E1169 Type 2 diabetes mellitus with other specified complication: Secondary | ICD-10-CM | POA: Diagnosis present

## 2021-06-06 DIAGNOSIS — Z7901 Long term (current) use of anticoagulants: Secondary | ICD-10-CM

## 2021-06-06 DIAGNOSIS — Z86711 Personal history of pulmonary embolism: Secondary | ICD-10-CM

## 2021-06-06 DIAGNOSIS — Z20822 Contact with and (suspected) exposure to covid-19: Secondary | ICD-10-CM | POA: Diagnosis present

## 2021-06-06 DIAGNOSIS — L8990 Pressure ulcer of unspecified site, unspecified stage: Secondary | ICD-10-CM | POA: Insufficient documentation

## 2021-06-06 DIAGNOSIS — Z853 Personal history of malignant neoplasm of breast: Secondary | ICD-10-CM

## 2021-06-06 DIAGNOSIS — R0902 Hypoxemia: Secondary | ICD-10-CM | POA: Diagnosis present

## 2021-06-06 DIAGNOSIS — M069 Rheumatoid arthritis, unspecified: Secondary | ICD-10-CM | POA: Diagnosis present

## 2021-06-06 DIAGNOSIS — G8929 Other chronic pain: Secondary | ICD-10-CM | POA: Diagnosis present

## 2021-06-06 DIAGNOSIS — N183 Chronic kidney disease, stage 3 unspecified: Secondary | ICD-10-CM | POA: Diagnosis present

## 2021-06-06 DIAGNOSIS — E860 Dehydration: Secondary | ICD-10-CM | POA: Diagnosis not present

## 2021-06-06 DIAGNOSIS — F32A Depression, unspecified: Secondary | ICD-10-CM | POA: Diagnosis present

## 2021-06-06 DIAGNOSIS — M545 Low back pain, unspecified: Secondary | ICD-10-CM | POA: Diagnosis present

## 2021-06-06 DIAGNOSIS — Z8542 Personal history of malignant neoplasm of other parts of uterus: Secondary | ICD-10-CM

## 2021-06-06 DIAGNOSIS — M549 Dorsalgia, unspecified: Secondary | ICD-10-CM | POA: Diagnosis not present

## 2021-06-06 DIAGNOSIS — Z803 Family history of malignant neoplasm of breast: Secondary | ICD-10-CM

## 2021-06-06 DIAGNOSIS — D631 Anemia in chronic kidney disease: Secondary | ICD-10-CM | POA: Diagnosis present

## 2021-06-06 DIAGNOSIS — N179 Acute kidney failure, unspecified: Secondary | ICD-10-CM | POA: Diagnosis present

## 2021-06-06 DIAGNOSIS — F05 Delirium due to known physiological condition: Secondary | ICD-10-CM | POA: Diagnosis not present

## 2021-06-06 DIAGNOSIS — S32019A Unspecified fracture of first lumbar vertebra, initial encounter for closed fracture: Secondary | ICD-10-CM

## 2021-06-06 DIAGNOSIS — Z96653 Presence of artificial knee joint, bilateral: Secondary | ICD-10-CM | POA: Diagnosis present

## 2021-06-06 LAB — CBC WITH DIFFERENTIAL/PLATELET
Abs Immature Granulocytes: 0.14 10*3/uL — ABNORMAL HIGH (ref 0.00–0.07)
Basophils Absolute: 0.1 10*3/uL (ref 0.0–0.1)
Basophils Relative: 1 %
Eosinophils Absolute: 0.2 10*3/uL (ref 0.0–0.5)
Eosinophils Relative: 3 %
HCT: 40.4 % (ref 36.0–46.0)
Hemoglobin: 12.1 g/dL (ref 12.0–15.0)
Immature Granulocytes: 2 %
Lymphocytes Relative: 22 %
Lymphs Abs: 1.7 10*3/uL (ref 0.7–4.0)
MCH: 27.2 pg (ref 26.0–34.0)
MCHC: 30 g/dL (ref 30.0–36.0)
MCV: 90.8 fL (ref 80.0–100.0)
Monocytes Absolute: 0.7 10*3/uL (ref 0.1–1.0)
Monocytes Relative: 9 %
Neutro Abs: 4.9 10*3/uL (ref 1.7–7.7)
Neutrophils Relative %: 63 %
Platelets: 326 10*3/uL (ref 150–400)
RBC: 4.45 MIL/uL (ref 3.87–5.11)
RDW: 15.4 % (ref 11.5–15.5)
WBC: 7.7 10*3/uL (ref 4.0–10.5)
nRBC: 0 % (ref 0.0–0.2)

## 2021-06-06 LAB — COMPREHENSIVE METABOLIC PANEL
ALT: 16 U/L (ref 0–44)
AST: 28 U/L (ref 15–41)
Albumin: 3.5 g/dL (ref 3.5–5.0)
Alkaline Phosphatase: 46 U/L (ref 38–126)
Anion gap: 5 (ref 5–15)
BUN: 31 mg/dL — ABNORMAL HIGH (ref 8–23)
CO2: 25 mmol/L (ref 22–32)
Calcium: 9.1 mg/dL (ref 8.9–10.3)
Chloride: 107 mmol/L (ref 98–111)
Creatinine, Ser: 1.68 mg/dL — ABNORMAL HIGH (ref 0.44–1.00)
GFR, Estimated: 31 mL/min — ABNORMAL LOW (ref >60)
Glucose, Bld: 91 mg/dL (ref 70–99)
Potassium: 5.5 mmol/L — ABNORMAL HIGH (ref 3.5–5.1)
Sodium: 137 mmol/L (ref 135–145)
Total Bilirubin: 1 mg/dL (ref 0.3–1.2)
Total Protein: 7.5 g/dL (ref 6.5–8.1)

## 2021-06-06 LAB — RESP PANEL BY RT-PCR (FLU A&B, COVID) ARPGX2
Influenza A by PCR: NEGATIVE
Influenza B by PCR: NEGATIVE
SARS Coronavirus 2 by RT PCR: NEGATIVE

## 2021-06-06 LAB — LACTIC ACID, PLASMA: Lactic Acid, Venous: 1.2 mmol/L (ref 0.5–1.9)

## 2021-06-06 MED ORDER — MORPHINE SULFATE (PF) 4 MG/ML IV SOLN
4.0000 mg | Freq: Once | INTRAVENOUS | Status: AC
  Administered 2021-06-06: 4 mg via INTRAVENOUS
  Filled 2021-06-06: qty 1

## 2021-06-06 MED ORDER — LORAZEPAM 2 MG/ML IJ SOLN
0.5000 mg | Freq: Once | INTRAMUSCULAR | Status: AC | PRN
  Administered 2021-06-06: 0.5 mg via INTRAVENOUS
  Filled 2021-06-06: qty 1

## 2021-06-06 NOTE — ED Notes (Signed)
Call received from pt son Mahrukh Seguin [phone] requesting rtn call for pt status/updates. [employer]

## 2021-06-06 NOTE — ED Triage Notes (Signed)
Patient takes she was taking OT 2 weeks ago and was reaching out, and heard a pop in her back. The pain has been constant for 2 weeks, not getting any better. States it has gotten worse over past few days.

## 2021-06-06 NOTE — ED Provider Notes (Signed)
[facility] DEPT Provider Note   CSN: [id number] Arrival date & time: 06/06/21  1834     History  Chief Complaint  Patient presents with   Back Pain    Patient takes she was taking OT 2 weeks ago and was reaching out, and heard a pop in her back. The pain has been constant for 2 weeks, not getting any better. States it has gotten worse over past few days.     [name] is a 81 y.o. female.  The history is provided by the patient and medical records.  Back Pain Location:  Thoracic spine and lumbar spine Quality:  Aching and shooting Radiates to:  Does not radiate Pain severity:  Severe Pain is:  Same all the time Onset quality:  Sudden Duration:  2 weeks Timing:  Constant Progression:  Worsening Chronicity:  New Relieved by:  Nothing Worsened by:  Palpation and twisting Ineffective treatments:  None tried Associated symptoms: weakness   Associated symptoms: no abdominal pain, no bladder incontinence, no bowel incontinence, no chest pain, no fever, no headaches, no numbness, no paresthesias, no perianal numbness and no tingling       Home Medications Prior to Admission medications   Medication Sig Start Date End Date Taking? Authorizing Provider  acetaminophen (TYLENOL) 650 MG CR tablet Take 1,300 mg by mouth every 8 (eight) hours as needed for pain.     [provider]  apixaban (ELIQUIS) 5 MG TABS tablet Take 5 mg by mouth 2 (two) times daily.    [provider]  bisacodyl (DULCOLAX) 10 MG suppository Place 10 mg rectally daily. 11/29/19   [provider]  buPROPion (WELLBUTRIN SR) 150 MG 12 hr tablet Take 150 mg by mouth 2 (two) times daily. 11/11/15   [provider]  docusate sodium (COLACE) 100 MG capsule Take 1 capsule (100 mg total) by mouth daily. 05/15/19   Georgette Shell, MD  donepezil (ARICEPT) 5 MG tablet Take 5 mg by mouth daily. 11/21/19   [provider]  DULoxetine  (CYMBALTA) 20 MG capsule Take 1 capsule (20 mg total) by mouth daily. 08/17/13   Gardenia Phlegm, NP  eszopiclone (LUNESTA) 2 MG TABS tablet Take 2 mg by mouth at bedtime as needed for sleep.  04/05/19   [provider]  exemestane (AROMASIN) 25 MG tablet Take 1 tablet by mouth once daily 06/05/19   Magrinat, Virgie Dad, MD  fenofibrate 160 MG tablet Take 160 mg by mouth daily. 10/02/15   [provider]  gabapentin (NEURONTIN) 300 MG capsule TAKE 1 CAPSULE BY MOUTH AT BEDTIME Patient taking differently: Take 300 mg by mouth 2 (two) times daily.  02/15/18   Magrinat, Virgie Dad, MD  Menthol, Topical Analgesic, (BIOFREEZE ROLL-ON) 4 % GEL Apply 1 application topically 2 (two) times daily as needed (heel pain).     [provider]  metFORMIN (GLUCOPHAGE) 500 MG tablet Take 1 tablet (500 mg total) by mouth 2 (two) times daily with a meal. 12/09/15   [name], MD  metoprolol succinate (TOPROL-XL) 50 MG 24 hr tablet Take 50 mg by mouth 2 (two) times daily.  12/19/12   [provider]  Nutritional Supplements (QUINOA KALE & HEMP PO) Take 5 drops by mouth daily.     [provider]  ondansetron (ZOFRAN) 4 MG tablet Take 1 tablet (4 mg total) by mouth every 6 (six) hours as needed for nausea. 05/15/19   Georgette Shell,  MD  polyethylene glycol (MIRALAX / GLYCOLAX) 17 g packet Take 17 g by mouth 2 (two) times daily. 07/10/20   Ripley Fraise, MD  senna (SENOKOT) 8.6 MG TABS tablet Take 1 tablet (8.6 mg total) by mouth daily. 05/15/19   Georgette Shell, MD  SENNA-PLUS 8.6-50 MG tablet  11/21/19   [provider]  spironolactone (ALDACTONE) 50 MG tablet Take 50 mg by mouth every morning. 04/24/17   [provider]      Allergies    Iodine    Review of Systems   Review of Systems  Constitutional:  Negative for chills, fatigue and fever.  HENT:  Negative for congestion.   Respiratory:  Positive for cough. Negative for chest  tightness, shortness of breath and wheezing.   Cardiovascular:  Negative for chest pain, palpitations and leg swelling.  Gastrointestinal:  Negative for abdominal pain, bowel incontinence, diarrhea, nausea and vomiting.  Genitourinary:  Negative for bladder incontinence.  Musculoskeletal:  Positive for back pain. Negative for neck pain and neck stiffness.  Skin:  Negative for rash and wound.  Neurological:  Positive for weakness. Negative for tingling, numbness, headaches and paresthesias.  Psychiatric/Behavioral:  Negative for agitation and confusion.   All other systems reviewed and are negative.  Physical Exam Updated Vital Signs BP (!) 121/91    Pulse 93    Temp 98.6 F (37 C) (Oral)    Resp 18    Ht 5\' 5"  (1.651 m)    Wt 136.1 kg    SpO2 94%    BMI 49.92 kg/m  Physical Exam Vitals and nursing note reviewed.  Constitutional:      General: She is not in acute distress.    Appearance: She is well-developed.  HENT:     Head: Normocephalic and atraumatic.     Mouth/Throat:     Mouth: Mucous membranes are moist.  Eyes:     Extraocular Movements: Extraocular movements intact.     Conjunctiva/sclera: Conjunctivae normal.     Pupils: Pupils are equal, round, and reactive to light.  Cardiovascular:     Rate and Rhythm: Normal rate and regular rhythm.     Heart sounds: No murmur heard. Pulmonary:     Effort: Pulmonary effort is normal. No respiratory distress.     Breath sounds: Normal breath sounds. No wheezing, rhonchi or rales.  Chest:     Chest wall: No tenderness.  Abdominal:     General: Abdomen is flat.     Palpations: Abdomen is soft.     Tenderness: There is no abdominal tenderness.  Musculoskeletal:        General: Tenderness present. No swelling.     Cervical back: Neck supple. No tenderness.     Thoracic back: Spasms and tenderness present.     Lumbar back: Spasms and tenderness present.       Back:     Right lower leg: Edema present.     Left lower leg: Edema  present.  Skin:    General: Skin is warm and dry.     Capillary Refill: Capillary refill takes less than 2 seconds.  Neurological:     Mental Status: She is alert.     Sensory: No sensory deficit.     Motor: Weakness and abnormal muscle tone present.     Coordination: Finger-Nose-Finger Test normal.     Comments: Weakness in both legs, R>L. No numbness,   Psychiatric:        Mood and Affect: Mood normal.  ED Results / Procedures / Treatments   Labs (all labs ordered are listed, but only abnormal results are displayed) Labs Reviewed  CBC WITH DIFFERENTIAL/PLATELET - Abnormal; Notable for the following components:      Result Value   Abs Immature Granulocytes 0.14 (*)    All other components within normal limits  COMPREHENSIVE METABOLIC PANEL - Abnormal; Notable for the following components:   Potassium 5.5 (*)    BUN 31 (*)    Creatinine, Ser 1.68 (*)    GFR, Estimated 31 (*)    All other components within normal limits  RESP PANEL BY RT-PCR (FLU A&B, COVID) [id number]  LACTIC ACID, PLASMA  LACTIC ACID, PLASMA    EKG None  Radiology DG Chest Portable 1 View  Result Date: 06/06/2021 CLINICAL DATA:  Cough, hypoxia EXAM: PORTABLE CHEST 1 VIEW COMPARISON:  12/02/2015 FINDINGS: Heart size appears enlarged. Aortic atherosclerosis. Low lung volumes. Streaky right basilar opacity. Subtle interstitial opacities within the left lung base. No large pleural fluid collection. No pneumothorax. Degenerative changes of the shoulders. IMPRESSION: Low lung volumes with streaky right basilar opacity and subtle interstitial opacities within the left lung base. Findings may represent edema, infection, or atelectasis. Electronically Signed   By: Davina Poke D.O.   On: 06/06/2021 20:49    Procedures Procedures    Medications Ordered in ED Medications  morphine 4 MG/ML injection 4 mg (4 mg Intravenous Given 06/06/21 2059)  LORazepam (ATIVAN) injection 0.5 mg (0.5 mg Intravenous Given 06/06/21  2141)    ED Course/ Medical Decision Making/ A&P                           Medical Decision Making  [name] is a 81 y.o. 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  According to patient, 2 ago she was at occupational therapy when she was sitting on a chair.  She reports that she was instructed to lean forward and bend to reach out when she heard and felt a pop in her mid/low back and had immediate onset of pain.  She reports the pain has been worsening for the last 2 weeks but worsened even more so over the last 48 hours.  She reports that she is also had weakness in her legs that has been worsening and feels that her "right leg is feeling dead".  She denies numbness but reports her right leg is unable to raise off the bed and left leg can barely do so.  She reports she is wheelchair-bound and normally has some difficulty with her legs but has never had weakness like this.  She reports the pain is worsened with any movement and is up to 8 out of 10 in severity currently.  She reports no nausea, vomiting, constipation, or diarrhea and denies incontinence.  She denies any urinary changes.  She was found to be hypoxic on arrival and was placed on oxygen but she only reports a recent dry cough that has resolved.  No chest pain or shortness of breath.  No palpitations.  No other trauma.  On exam, lungs clear and chest nontender.  Abdomen nontender.  Patient was exquisitely tender in her thoracic and lumbar spine in the midline.  Her legs did have weakness in the right leg worse than the left but  had normal sensation.  She denies any perianal numbness.  Arms had symmetric grip strength and sensation and pulses.  Clinically I am somewhat concerned that patient could have a significant back injury  from the sudden onset of symptoms.  With her history of cancer, pathologic fractures considered and with her blood thinner use, some sort of hematoma is also considered.  We will get MRI with and without contrast of her thoracic and lumbar spine and will get some screening labs.  We will get chest x-ray due to the hypoxia of unclear etiology.  She is denying any chest pain or shortness of breath, doubt PE at this time.  Will get CXR to rule out PNA given the cough and hypoxia.   Anticipate reassessment after work-up to determine disposition.  Care transferred to oncoming team to determine disposition after imaging         Final Clinical Impression(s) / ED Diagnoses Final diagnoses:  Acute midline low back pain, unspecified whether sciatica present  Weakness of lower extremity, unspecified laterality     Clinical Impression: 1. Acute midline low back pain, unspecified whether sciatica present   2. Weakness of lower extremity, unspecified laterality     Disposition: are transferred to oncoming team to determine disposition after imaging   This note was prepared with assistance of Dragon voice recognition software. Occasional wrong-word or sound-a-like substitutions may have occurred due to the inherent limitations of voice recognition software.      Chirstine Defrain, Gwenyth Allegra, MD 06/07/21 [phone]

## 2021-06-07 ENCOUNTER — Encounter (HOSPITAL_COMMUNITY): Payer: Self-pay | Admitting: Family Medicine

## 2021-06-07 DIAGNOSIS — E875 Hyperkalemia: Secondary | ICD-10-CM

## 2021-06-07 DIAGNOSIS — R29898 Other symptoms and signs involving the musculoskeletal system: Secondary | ICD-10-CM

## 2021-06-07 DIAGNOSIS — E785 Hyperlipidemia, unspecified: Secondary | ICD-10-CM | POA: Diagnosis present

## 2021-06-07 DIAGNOSIS — E1169 Type 2 diabetes mellitus with other specified complication: Secondary | ICD-10-CM | POA: Diagnosis not present

## 2021-06-07 DIAGNOSIS — M549 Dorsalgia, unspecified: Secondary | ICD-10-CM | POA: Diagnosis present

## 2021-06-07 DIAGNOSIS — N179 Acute kidney failure, unspecified: Secondary | ICD-10-CM | POA: Diagnosis present

## 2021-06-07 DIAGNOSIS — N1832 Chronic kidney disease, stage 3b: Secondary | ICD-10-CM | POA: Diagnosis present

## 2021-06-07 DIAGNOSIS — Z79811 Long term (current) use of aromatase inhibitors: Secondary | ICD-10-CM | POA: Diagnosis not present

## 2021-06-07 DIAGNOSIS — S32019A Unspecified fracture of first lumbar vertebra, initial encounter for closed fracture: Secondary | ICD-10-CM | POA: Diagnosis not present

## 2021-06-07 DIAGNOSIS — L8990 Pressure ulcer of unspecified site, unspecified stage: Secondary | ICD-10-CM | POA: Insufficient documentation

## 2021-06-07 DIAGNOSIS — Z79899 Other long term (current) drug therapy: Secondary | ICD-10-CM | POA: Diagnosis not present

## 2021-06-07 DIAGNOSIS — F32A Depression, unspecified: Secondary | ICD-10-CM | POA: Diagnosis present

## 2021-06-07 DIAGNOSIS — Z803 Family history of malignant neoplasm of breast: Secondary | ICD-10-CM | POA: Diagnosis not present

## 2021-06-07 DIAGNOSIS — Z7984 Long term (current) use of oral hypoglycemic drugs: Secondary | ICD-10-CM | POA: Diagnosis not present

## 2021-06-07 DIAGNOSIS — M545 Low back pain, unspecified: Secondary | ICD-10-CM

## 2021-06-07 DIAGNOSIS — F05 Delirium due to known physiological condition: Secondary | ICD-10-CM | POA: Diagnosis not present

## 2021-06-07 DIAGNOSIS — M8448XA Pathological fracture, other site, initial encounter for fracture: Secondary | ICD-10-CM | POA: Diagnosis present

## 2021-06-07 DIAGNOSIS — Z20822 Contact with and (suspected) exposure to covid-19: Secondary | ICD-10-CM | POA: Diagnosis present

## 2021-06-07 DIAGNOSIS — R0902 Hypoxemia: Secondary | ICD-10-CM | POA: Diagnosis present

## 2021-06-07 DIAGNOSIS — Z888 Allergy status to other drugs, medicaments and biological substances status: Secondary | ICD-10-CM | POA: Diagnosis not present

## 2021-06-07 DIAGNOSIS — E1122 Type 2 diabetes mellitus with diabetic chronic kidney disease: Secondary | ICD-10-CM | POA: Diagnosis present

## 2021-06-07 DIAGNOSIS — Z6841 Body Mass Index (BMI) 40.0 and over, adult: Secondary | ICD-10-CM | POA: Diagnosis not present

## 2021-06-07 DIAGNOSIS — G9341 Metabolic encephalopathy: Secondary | ICD-10-CM | POA: Diagnosis not present

## 2021-06-07 DIAGNOSIS — Z993 Dependence on wheelchair: Secondary | ICD-10-CM | POA: Diagnosis not present

## 2021-06-07 DIAGNOSIS — L89152 Pressure ulcer of sacral region, stage 2: Secondary | ICD-10-CM

## 2021-06-07 DIAGNOSIS — L89312 Pressure ulcer of right buttock, stage 2: Secondary | ICD-10-CM | POA: Diagnosis present

## 2021-06-07 DIAGNOSIS — M069 Rheumatoid arthritis, unspecified: Secondary | ICD-10-CM | POA: Diagnosis present

## 2021-06-07 DIAGNOSIS — Z7901 Long term (current) use of anticoagulants: Secondary | ICD-10-CM | POA: Diagnosis not present

## 2021-06-07 DIAGNOSIS — I10 Essential (primary) hypertension: Secondary | ICD-10-CM | POA: Diagnosis not present

## 2021-06-07 DIAGNOSIS — E662 Morbid (severe) obesity with alveolar hypoventilation: Secondary | ICD-10-CM | POA: Diagnosis present

## 2021-06-07 DIAGNOSIS — I129 Hypertensive chronic kidney disease with stage 1 through stage 4 chronic kidney disease, or unspecified chronic kidney disease: Secondary | ICD-10-CM | POA: Diagnosis present

## 2021-06-07 DIAGNOSIS — E669 Obesity, unspecified: Secondary | ICD-10-CM

## 2021-06-07 DIAGNOSIS — D631 Anemia in chronic kidney disease: Secondary | ICD-10-CM | POA: Diagnosis present

## 2021-06-07 DIAGNOSIS — Z853 Personal history of malignant neoplasm of breast: Secondary | ICD-10-CM | POA: Diagnosis not present

## 2021-06-07 LAB — BASIC METABOLIC PANEL
Anion gap: 4 — ABNORMAL LOW (ref 5–15)
BUN: 27 mg/dL — ABNORMAL HIGH (ref 8–23)
CO2: 25 mmol/L (ref 22–32)
Calcium: 9.1 mg/dL (ref 8.9–10.3)
Chloride: 107 mmol/L (ref 98–111)
Creatinine, Ser: 1.59 mg/dL — ABNORMAL HIGH (ref 0.44–1.00)
GFR, Estimated: 33 mL/min — ABNORMAL LOW (ref >60)
Glucose, Bld: 119 mg/dL — ABNORMAL HIGH (ref 70–99)
Potassium: 4.6 mmol/L (ref 3.5–5.1)
Sodium: 136 mmol/L (ref 135–145)

## 2021-06-07 LAB — CBC
HCT: 41.2 % (ref 36.0–46.0)
Hemoglobin: 12 g/dL (ref 12.0–15.0)
MCH: 27 pg (ref 26.0–34.0)
MCHC: 29.1 g/dL — ABNORMAL LOW (ref 30.0–36.0)
MCV: 92.6 fL (ref 80.0–100.0)
Platelets: 314 10*3/uL (ref 150–400)
RBC: 4.45 MIL/uL (ref 3.87–5.11)
RDW: 15.4 % (ref 11.5–15.5)
WBC: 7.1 10*3/uL (ref 4.0–10.5)
nRBC: 0 % (ref 0.0–0.2)

## 2021-06-07 LAB — GLUCOSE, CAPILLARY
Glucose-Capillary: 113 mg/dL — ABNORMAL HIGH (ref 70–99)
Glucose-Capillary: 136 mg/dL — ABNORMAL HIGH (ref 70–99)
Glucose-Capillary: 148 mg/dL — ABNORMAL HIGH (ref 70–99)
Glucose-Capillary: 139 mg/dL — ABNORMAL HIGH (ref 70–99)
Glucose-Capillary: 138 mg/dL — ABNORMAL HIGH (ref 70–99)

## 2021-06-07 LAB — HEMOGLOBIN A1C
Hgb A1c MFr Bld: 7.2 % — ABNORMAL HIGH (ref 4.8–5.6)
Mean Plasma Glucose: 159.94 mg/dL

## 2021-06-07 LAB — CBG MONITORING, ED: Glucose-Capillary: 69 mg/dL — ABNORMAL LOW (ref 70–99)

## 2021-06-07 MED ORDER — LINACLOTIDE 72 MCG PO CAPS
72.0000 ug | ORAL_CAPSULE | Freq: Every morning | ORAL | Status: DC
  Administered 2021-06-07 – 2021-06-19 (×11): 72 ug via ORAL
  Filled 2021-06-07 (×13): qty 1

## 2021-06-07 MED ORDER — SODIUM ZIRCONIUM CYCLOSILICATE 5 G PO PACK
5.0000 g | PACK | Freq: Once | ORAL | Status: AC
  Administered 2021-06-07: 5 g via ORAL
  Filled 2021-06-07: qty 1

## 2021-06-07 MED ORDER — METOPROLOL SUCCINATE ER 50 MG PO TB24
50.0000 mg | ORAL_TABLET | Freq: Two times a day (BID) | ORAL | Status: DC
  Administered 2021-06-07 – 2021-06-19 (×24): 50 mg via ORAL
  Filled 2021-06-07 (×25): qty 1

## 2021-06-07 MED ORDER — ACETAMINOPHEN 650 MG RE SUPP: 650.0000 mg | Freq: Four times a day (QID) | RECTAL | Status: DC | PRN

## 2021-06-07 MED ORDER — DONEPEZIL HCL 10 MG PO TABS
5.0000 mg | ORAL_TABLET | Freq: Every day | ORAL | Status: DC
  Administered 2021-06-07 – 2021-06-19 (×12): 5 mg via ORAL
  Filled 2021-06-07 (×11): qty 1

## 2021-06-07 MED ORDER — ALBUTEROL SULFATE (2.5 MG/3ML) 0.083% IN NEBU: 3.0000 mL | INHALATION_SOLUTION | RESPIRATORY_TRACT | Status: DC | PRN

## 2021-06-07 MED ORDER — INSULIN ASPART 100 UNIT/ML IJ SOLN
0.0000 [IU] | Freq: Three times a day (TID) | INTRAMUSCULAR | Status: DC
  Administered 2021-06-07 – 2021-06-08 (×4): 2 [IU] via SUBCUTANEOUS
  Administered 2021-06-08: 3 [IU] via SUBCUTANEOUS
  Administered 2021-06-08 – 2021-06-09 (×2): 2 [IU] via SUBCUTANEOUS
  Administered 2021-06-09: 3 [IU] via SUBCUTANEOUS
  Administered 2021-06-09 – 2021-06-10 (×3): 2 [IU] via SUBCUTANEOUS
  Administered 2021-06-10 (×3): 3 [IU] via SUBCUTANEOUS
  Administered 2021-06-11 – 2021-06-12 (×5): 2 [IU] via SUBCUTANEOUS
  Administered 2021-06-15: 3 [IU] via SUBCUTANEOUS
  Administered 2021-06-15: 2 [IU] via SUBCUTANEOUS
  Administered 2021-06-15 – 2021-06-16 (×3): 3 [IU] via SUBCUTANEOUS
  Administered 2021-06-16: 12:00:00 5 [IU] via SUBCUTANEOUS
  Administered 2021-06-16: 16:00:00 3 [IU] via SUBCUTANEOUS
  Administered 2021-06-17 (×2): 2 [IU] via SUBCUTANEOUS
  Administered 2021-06-17 – 2021-06-18 (×2): 3 [IU] via SUBCUTANEOUS
  Administered 2021-06-18 (×3): 2 [IU] via SUBCUTANEOUS
  Administered 2021-06-19: 3 [IU] via SUBCUTANEOUS
  Administered 2021-06-19: 2 [IU] via SUBCUTANEOUS
  Administered 2021-06-19: 5 [IU] via SUBCUTANEOUS
  Filled 2021-06-07: qty 0.15

## 2021-06-07 MED ORDER — SENNOSIDES-DOCUSATE SODIUM 8.6-50 MG PO TABS: 1.0000 | ORAL_TABLET | Freq: Every evening | ORAL | Status: DC | PRN

## 2021-06-07 MED ORDER — EXEMESTANE 25 MG PO TABS
25.0000 mg | ORAL_TABLET | Freq: Every day | ORAL | Status: DC
  Administered 2021-06-07 – 2021-06-19 (×12): 25 mg via ORAL
  Filled 2021-06-07 (×13): qty 1

## 2021-06-07 MED ORDER — TRAMADOL HCL 50 MG PO TABS
50.0000 mg | ORAL_TABLET | Freq: Three times a day (TID) | ORAL | Status: DC | PRN
  Administered 2021-06-07: 50 mg via ORAL
  Filled 2021-06-07 (×2): qty 1

## 2021-06-07 MED ORDER — METFORMIN HCL 500 MG PO TABS
500.0000 mg | ORAL_TABLET | Freq: Two times a day (BID) | ORAL | Status: DC
  Administered 2021-06-07 – 2021-06-08 (×3): 500 mg via ORAL
  Filled 2021-06-07 (×3): qty 1

## 2021-06-07 MED ORDER — HYDROMORPHONE HCL 1 MG/ML IJ SOLN
1.0000 mg | INTRAMUSCULAR | Status: DC | PRN
  Administered 2021-06-07 (×2): 1 mg via INTRAVENOUS
  Filled 2021-06-07 (×2): qty 1

## 2021-06-07 MED ORDER — RISAQUAD PO CAPS
1.0000 | ORAL_CAPSULE | Freq: Every day | ORAL | Status: DC
  Administered 2021-06-07 – 2021-06-19 (×12): 1 via ORAL
  Filled 2021-06-07 (×12): qty 1

## 2021-06-07 MED ORDER — FUROSEMIDE 20 MG PO TABS
20.0000 mg | ORAL_TABLET | Freq: Two times a day (BID) | ORAL | Status: DC
  Administered 2021-06-07 – 2021-06-08 (×2): 20 mg via ORAL
  Filled 2021-06-07 (×2): qty 1

## 2021-06-07 MED ORDER — ACETAMINOPHEN 325 MG PO TABS
650.0000 mg | ORAL_TABLET | Freq: Four times a day (QID) | ORAL | Status: DC | PRN
  Administered 2021-06-10 – 2021-06-12 (×2): 650 mg via ORAL
  Filled 2021-06-07 (×4): qty 2

## 2021-06-07 MED ORDER — FAMOTIDINE 20 MG PO TABS
20.0000 mg | ORAL_TABLET | Freq: Two times a day (BID) | ORAL | Status: DC
  Administered 2021-06-07 – 2021-06-19 (×22): 20 mg via ORAL
  Filled 2021-06-07 (×24): qty 1

## 2021-06-07 MED ORDER — SENNOSIDES-DOCUSATE SODIUM 8.6-50 MG PO TABS
1.0000 | ORAL_TABLET | Freq: Two times a day (BID) | ORAL | Status: DC
  Administered 2021-06-07 – 2021-06-19 (×18): 1 via ORAL
  Filled 2021-06-07 (×20): qty 1

## 2021-06-07 MED ORDER — BUPROPION HCL ER (SR) 150 MG PO TB12
150.0000 mg | ORAL_TABLET | Freq: Two times a day (BID) | ORAL | Status: DC
  Administered 2021-06-07 – 2021-06-19 (×25): 150 mg via ORAL
  Filled 2021-06-07 (×26): qty 1

## 2021-06-07 MED ORDER — APIXABAN 5 MG PO TABS
5.0000 mg | ORAL_TABLET | Freq: Two times a day (BID) | ORAL | Status: DC
  Administered 2021-06-07 – 2021-06-08 (×3): 5 mg via ORAL
  Filled 2021-06-07 (×3): qty 1

## 2021-06-07 MED ORDER — LACTATED RINGERS IV SOLN: INTRAVENOUS | Status: DC

## 2021-06-07 MED ORDER — DULOXETINE HCL 20 MG PO CPEP
20.0000 mg | ORAL_CAPSULE | Freq: Every day | ORAL | Status: DC
  Administered 2021-06-07 – 2021-06-19 (×12): 20 mg via ORAL
  Filled 2021-06-07 (×13): qty 1

## 2021-06-07 MED ORDER — DOCUSATE SODIUM 100 MG PO CAPS
100.0000 mg | ORAL_CAPSULE | Freq: Every day | ORAL | Status: DC
  Administered 2021-06-07 – 2021-06-19 (×9): 100 mg via ORAL
  Filled 2021-06-07 (×10): qty 1

## 2021-06-07 MED ORDER — MENTHOL (TOPICAL ANALGESIC) 4 % EX GEL: 1.0000 "application " | Freq: Two times a day (BID) | CUTANEOUS | Status: DC | PRN

## 2021-06-07 MED ORDER — BISACODYL 10 MG RE SUPP
10.0000 mg | Freq: Every day | RECTAL | Status: DC
  Administered 2021-06-11 – 2021-06-19 (×3): 10 mg via RECTAL
  Filled 2021-06-07 (×5): qty 1

## 2021-06-07 MED ORDER — BENZONATATE 100 MG PO CAPS
200.0000 mg | ORAL_CAPSULE | Freq: Three times a day (TID) | ORAL | Status: DC
  Administered 2021-06-07 – 2021-06-19 (×32): 200 mg via ORAL
  Filled 2021-06-07 (×32): qty 2

## 2021-06-07 MED ORDER — TIZANIDINE HCL 4 MG PO TABS
4.0000 mg | ORAL_TABLET | Freq: Three times a day (TID) | ORAL | Status: DC
  Administered 2021-06-07 – 2021-06-08 (×3): 4 mg via ORAL
  Filled 2021-06-07 (×3): qty 1

## 2021-06-07 MED ORDER — GABAPENTIN 300 MG PO CAPS
300.0000 mg | ORAL_CAPSULE | Freq: Two times a day (BID) | ORAL | Status: DC
  Administered 2021-06-07 – 2021-06-08 (×3): 300 mg via ORAL
  Filled 2021-06-07 (×3): qty 1

## 2021-06-07 NOTE — ED Provider Notes (Signed)
°  Provider Note MRN:  [id number]  Arrival date & time: 06/07/21    ED Course and Medical Decision Making  Assumed care from Dr. Sherry Ruffing at shift change.  History of PE, diabetes, sudden onset back pain with bilateral leg weakness right greater than left, awaiting MRI.  MRI revealing L1 inferior endplate fracture, acute.  No cord involvement.  Case discussed with Dr. Venetia Constable of neurosurgery, TLSO otherwise nothing to do.  Patient's pain seems more pain limiting, will admit to medicine for pain control, PT.  Procedures  Final Clinical Impressions(s) / ED Diagnoses     ICD-10-CM   1. Acute midline low back pain, unspecified whether sciatica present  M54.50     2. Weakness of lower extremity, unspecified laterality  R29.898       ED Discharge Orders     None       Discharge Instructions   None     Barth Kirks. Sedonia Small, [place] [email or web address]    Maudie Flakes, MD 06/07/21 [phone]

## 2021-06-07 NOTE — Plan of Care (Signed)
  Problem: Pain Managment: Goal: General experience of comfort will improve Outcome: Progressing   

## 2021-06-07 NOTE — Progress Notes (Signed)
Same day note  Patient seen and examined at bedside.  Patient was admitted to the hospital for back pain.  At the time of my evaluation, patient complains of back pain  Physical examination reveals mild tenderness over the lower back, morbidly obese, mild weakness in the bilateral lower extremities.  Laboratory data and imaging was reviewed,  Assessment and Plan.  Closed L1 vertebral fracture with back pain.   Continue pain management.  ER had spoken with neurosurgery who recommended nonsurgical intervention with brace and follow-up with neurosurgery.  PT evaluation.   Hyperkalemia.  Patient received 1 dose of Lokelma.  Repeat potassium of 4.6.  Diabetes mellitus type 2.  Continue sliding scale insulin Accu-Cheks.  POC glucose of 136  Stage IIIb CKD.  We will continue to monitor renal function closely.  Latest creatinine of 1.5.  Essential hypertension.  Continue metoprolol.  Spironolactone on hold due to hyperkalemia.  Right buttocks stage II pressure injury present on admission.  Continue wound care. Pressure Injury 06/07/21 Buttocks Right Stage 2 -  Partial thickness loss of dermis presenting as a shallow open injury with a red, pink wound bed without slough. open skin with pink center, sourrounding by epithelial tissue (Active)  06/07/21 0500  Location: Buttocks  Location Orientation: Right  Staging: Stage 2 -  Partial thickness loss of dermis presenting as a shallow open injury with a red, pink wound bed without slough.  Wound Description (Comments): open skin with pink center, sourrounding by epithelial tissue  Present on Admission: Yes   Disposition.  Patient is from assisted living facility.  We will get PT evaluation.    Spoke with the patient's son and updated him about the clinical condition of the patient.   No Charge  Signed,  Delila Pereyra, MD [facility]

## 2021-06-07 NOTE — ED Notes (Signed)
Dr. Vickki Muff (son) [phone]

## 2021-06-07 NOTE — Progress Notes (Signed)
Orthopedic Tech Progress Note Patient Details:  [name] Apr 22, 1941 [id number]  Patient ID: [name], female   DOB: Sep 16, 1940, 81 y.o.   MRN: [id number] Called order into [facility]. Karolee Stamps 06/07/2021, 5:27 AM

## 2021-06-07 NOTE — H&P (Signed)
History and Physical    [name] DOB: May 13, 1941 DOA: 06/06/2021  PCP: Willey Blade, MD   Patient coming from: Assisted Living  Chief Complaint: Back pain  HPI: [name] is a 81 y.o. female with medical history significant for DMT2, HTN, RA, Breast and uterine cancer, chronic back pain, HLD who presents for evaluation of low back pain.  Patient has been wheelchair-bound for the last 3 years but has been able to stand up to get in and out of bed, chairs or to the bathroom but 3 weeks ago had an increase in her low back pain severity.  She reports she was working with therapy when she was reaching out to the therapist when she felt a "pop" in her back and has had severe pain since then.  She reports the pain is a 10 out of 10 at its worst.  Pain is worsened if she tries to move.  She has not been able to stand up.  She has increased pain if she moves her left leg.  She has not had any fever.  She denies any bowel or bladder dysfunction.  MRI was obtained in the emergency room which showed a L1 inferior endplate fracture with no cord involvement.  ER physician discussed with neurosurgery Dr. Venetia Constable who asked for a TL SLO brace to be placed and there was no surgical intervention needed.  She has had the emergency room to have mild hyperkalemia of 5.5.  Is noted that she is on spironolactone which will be held.  She has chronic kidney disease stage IIIb which is stable.  CBC is unremarkable.  Lactic acid was normal at 1.2  Review of Systems:  General: Denies  fever, chills, weight loss, night sweats.  Denies dizziness.   HENT: Denies head trauma, headache, denies tinnitus. Denies nasal congestion or bleeding.  Denies sore throat.  Denies difficulty swallowing Eyes: Denies blurry vision, pain in eye, drainage.  Denies discoloration of eyes. Neck: Denies pain.  Denies swelling.  Denies pain with movement. Cardiovascular: Denies chest pain, palpitations. Denies  edema. Denies orthopnea Respiratory: Denies shortness of breath, cough. Denies wheezing. Denies sputum production Gastrointestinal: Denies abdominal pain, swelling. Denies nausea, vomiting, diarrhea. Denies melena.  Denies hematemesis. Musculoskeletal: Reports limitation of movement legs, Reports low back pain.  Denies deformity. Genitourinary: Denies pelvic pain.  Denies urinary frequency or hesitancy.  Denies dysuria.  Skin: Denies rash.  Denies petechiae, purpura, ecchymosis. Neurological: Denies syncope. Denies seizure activity. Denies paresthesia. Denies slurred speech, drooping face.  Denies visual change. Psychiatric: Denies depression, anxiety.   Past Medical History:  Diagnosis Date   Allergy    Arthritis    "joints; mostly on the right side" (01/11/2013)   Breast cancer ([place])    "left" (01/11/2013)   Cancer ([place])    endometrial   Chronic lower back pain    Cold    just started 2/87/86 cough   Complication of anesthesia    very phobic about needles.last surgery had to sedate before doing iv   Depression    Exertional shortness of breath    Hyperlipidemia    Hypertension    Osteoporosis    knees    Personal history of radiation therapy 03/2013   left   Status post radiation therapy within last four weeks 03/19/13-05/03/13   lt breast 60.4GY   Uterine cancer [facility])     Past Surgical History:  Procedure Laterality Date   ABDOMINAL HYSTERECTOMY  12/14/2012   BREAST BIOPSY  Left 12/2012   BREAST LUMPECTOMY Left 2014   BREAST LUMPECTOMY WITH NEEDLE LOCALIZATION AND AXILLARY SENTINEL LYMPH NODE BX Left 01/11/2013   BREAST LUMPECTOMY WITH NEEDLE LOCALIZATION AND AXILLARY SENTINEL LYMPH NODE BX Left 01/11/2013   Procedure: BREAST LUMPECTOMY WITH NEEDLE LOCALIZATION AND AXILLARY SENTINEL LYMPH NODE BX;  Surgeon: Rolm Bookbinder, MD;  Location: [place];  Service: General;  Laterality: Left;   [place]     "@ least one" (01/11/2013)    FOOT TENDON SURGERY Right    KNEE ARTHROSCOPY Right 1990's   "twice" (01/11/2013)   REPLACEMENT TOTAL KNEE Bilateral 2011-2012   TUBAL LIGATION      Social History  reports that she has never smoked. She has never used smokeless tobacco. She reports that she does not drink alcohol and does not use drugs.  Allergies  Allergen Reactions   Iodine Rash    Family History  Problem Relation Age of Onset   Breast cancer Mother 72   Esophageal cancer Neg Hx    Colon cancer Neg Hx    Stomach cancer Neg Hx    Rectal cancer Neg Hx      Prior to Admission medications   Medication Sig Start Date End Date Taking? Authorizing Provider  acetaminophen (TYLENOL) 650 MG CR tablet Take 1,300 mg by mouth every 8 (eight) hours as needed for pain.     [provider]  albuterol (VENTOLIN HFA) 108 (90 Base) MCG/ACT inhaler Inhale into the lungs. 06/04/21   [provider]  apixaban (ELIQUIS) 5 MG TABS tablet Take 5 mg by mouth 2 (two) times daily.    [provider]  benzonatate (TESSALON) 100 MG capsule Take 200 mg by mouth 3 (three) times daily. 05/16/21   [provider]  bisacodyl (DULCOLAX) 10 MG suppository Place 10 mg rectally daily. 11/29/19   [provider]  buPROPion (WELLBUTRIN SR) 150 MG 12 hr tablet Take 150 mg by mouth 2 (two) times daily. 11/11/15   [provider]  CALMOSEPTINE 0.44-20.6 % OINT Apply topically. 06/03/21   [provider]  docusate sodium (COLACE) 100 MG capsule Take 1 capsule (100 mg total) by mouth daily. 05/15/19   Georgette Shell, MD  donepezil (ARICEPT) 5 MG tablet Take 5 mg by mouth daily. 11/21/19   [provider]  DULoxetine (CYMBALTA) 20 MG capsule Take 1 capsule (20 mg total) by mouth daily. 08/17/13   Gardenia Phlegm, NP  eszopiclone (LUNESTA) 2 MG TABS tablet Take 2 mg by mouth at bedtime as needed for sleep.  04/05/19   [provider]  Eszopiclone 3 MG TABS Take 3 mg by  mouth at bedtime as needed. 05/01/21   [provider]  exemestane (AROMASIN) 25 MG tablet Take 1 tablet by mouth once daily 06/05/19   Magrinat, Virgie Dad, MD  famotidine (PEPCID) 20 MG tablet Take 20 mg by mouth 2 (two) times daily. 05/28/21   [provider]  fenofibrate 160 MG tablet Take 160 mg by mouth daily. 10/02/15   [provider]  furosemide (LASIX) 20 MG tablet Take 20 mg by mouth 2 (two) times daily. 03/16/21   [provider]  gabapentin (NEURONTIN) 300 MG capsule TAKE 1 CAPSULE BY MOUTH AT BEDTIME Patient taking differently: Take 300 mg by mouth 2 (two) times daily.  02/15/18   Magrinat, Virgie Dad, MD  glipiZIDE-metformin (METAGLIP) 2.5-500 MG tablet Take 1 tablet by mouth 2 (two) times  daily. 03/13/21   [provider]  HYDROcodone bit-homatropine (HYCODAN) 5-1.5 MG/5ML syrup SMARTSIG:5 Milliliter(s) By Mouth Every 12 Hours PRN 05/11/21   [provider]  ibuprofen (ADVIL) 600 MG tablet Take 600 mg by mouth 3 (three) times daily as needed. 05/01/21   [provider]  LINZESS 72 MCG capsule Take 72 mcg by mouth every morning. 06/03/21   [provider]  Menthol, Topical Analgesic, (BIOFREEZE ROLL-ON) 4 % GEL Apply 1 application topically 2 (two) times daily as needed (heel pain).     [provider]  metFORMIN (GLUCOPHAGE) 500 MG tablet Take 1 tablet (500 mg total) by mouth 2 (two) times daily with a meal. 12/09/15   [name], MD  metoprolol succinate (TOPROL-XL) 50 MG 24 hr tablet Take 50 mg by mouth 2 (two) times daily.  12/19/12   [provider]  Nutritional Supplements (QUINOA KALE & HEMP PO) Take 5 drops by mouth daily.     [provider]  ondansetron (ZOFRAN) 4 MG tablet Take 1 tablet (4 mg total) by mouth every 6 (six) hours as needed for nausea. 05/15/19   Georgette Shell, MD  polyethylene glycol (MIRALAX / GLYCOLAX) 17 g packet Take 17 g by mouth 2 (two) times daily. 07/10/20    Ripley Fraise, MD  Probiotic Product (RESTORA) CAPS Take 1 capsule by mouth daily. 05/01/21   [provider]  senna (SENOKOT) 8.6 MG TABS tablet Take 1 tablet (8.6 mg total) by mouth daily. 05/15/19   Georgette Shell, MD  SENNA-PLUS 8.6-50 MG tablet  11/21/19   [provider]  spironolactone (ALDACTONE) 50 MG tablet Take 50 mg by mouth every morning. 04/24/17   [provider]  tiZANidine (ZANAFLEX) 4 MG tablet Take 4 mg by mouth every 8 (eight) hours. 06/03/21   [provider]  traMADol (ULTRAM) 50 MG tablet Take 50 mg by mouth 2 (two) times daily as needed. 05/04/21   [provider]    Physical Exam: Vitals:   06/07/21 0145 06/07/21 0216 06/07/21 0230 06/07/21 0300  BP: 130/65  133/74 116/78  Pulse: 96  96 94  Resp: 20 20 20 20   Temp:      TempSrc:      SpO2: 95%  94% 98%  Weight:      Height:        Constitutional: NAD, calm, comfortable Vitals:   06/07/21 0145 06/07/21 0216 06/07/21 0230 06/07/21 0300  BP: 130/65  133/74 116/78  Pulse: 96  96 94  Resp: 20 20 20 20   Temp:      TempSrc:      SpO2: 95%  94% 98%  Weight:      Height:       General: WDWN, Alert and oriented x3.  Eyes: EOMI, PERRL, conjunctivae normal.  Sclera nonicteric HENT:  [place]/AT, external ears normal.  Nares patent without epistasis.  Mucous membranes are moist.  Neck: Soft, normal range of motion, supple, no masses, no thyromegaly.  Trachea midline Respiratory: clear to auscultation bilaterally, no wheezing, no crackles. Normal respiratory effort.  Cardiovascular: Regular rate and rhythm, no murmurs / rubs / gallops. Trace lower extremity edema. 1+ pedal pulses.  Abdomen: Soft, no tenderness, nondistended, no rebound or guarding. Morbid obesity. Bowel sounds normoactive Musculoskeletal: Moves all extremities but has limited movement of left LE due to pain. no cyanosis. No joint deformity upper and lower extremities. Normal muscle tone. SLR positive on  left Skin: Warm, dry, intact no  rashes, lesions, ulcers. No induration Neurologic: CN 2-12 grossly intact.  Normal speech.  Sensation intact to touch. Strength 4/5 in upper extremities.  Strength 4/5 in RLE, 3/5 in LLE Psychiatric: Normal judgment and insight. Normal mood.    Labs on Admission: I have personally reviewed following labs and imaging studies  CBC: Recent Labs  Lab 06/06/21 2002  WBC 7.7  NEUTROABS 4.9  HGB 12.1  HCT 40.4  MCV 90.8  PLT 678    Basic Metabolic Panel: Recent Labs  Lab 06/06/21 2002  NA 137  K 5.5*  CL 107  CO2 25  GLUCOSE 91  BUN 31*  CREATININE 1.68*  CALCIUM 9.1    GFR: Estimated Creatinine Clearance: 38.6 mL/min (A) (by C-G formula based on SCr of 1.68 mg/dL (H)).  Liver Function Tests: Recent Labs  Lab 06/06/21 2002  AST 28  ALT 16  ALKPHOS 46  BILITOT 1.0  PROT 7.5  ALBUMIN 3.5    Urine analysis:    Component Value Date/Time   COLORURINE YELLOW 12/07/2019 1035   APPEARANCEUR HAZY (A) 12/07/2019 1035   LABSPEC 1.033 (H) 12/07/2019 1035   PHURINE 5.0 12/07/2019 1035   GLUCOSEU NEGATIVE 12/07/2019 1035   HGBUR NEGATIVE 12/07/2019 1035   BILIRUBINUR NEGATIVE 12/07/2019 1035   KETONESUR NEGATIVE 12/07/2019 1035   PROTEINUR NEGATIVE 12/07/2019 1035   UROBILINOGEN 0.2 03/31/2010 1116   NITRITE NEGATIVE 12/07/2019 1035   LEUKOCYTESUR TRACE (A) 12/07/2019 1035    Radiological Exams on Admission: MR THORACIC SPINE WO CONTRAST  Result Date: 06/06/2021 CLINICAL DATA:  Severe back pain and lower extremity weakness EXAM: MRI THORACIC AND LUMBAR SPINE WITHOUT CONTRAST TECHNIQUE: Multiplanar and multiecho pulse sequences of the thoracic and lumbar spine were obtained without intravenous contrast. COMPARISON:  None. FINDINGS: Images are motion degraded and the study is truncated due to patient discomfort and inability to tolerate the full length of the examination. MRI THORACIC SPINE FINDINGS Alignment:  Physiologic. Vertebrae: No  fracture, evidence of discitis, or bone lesion. Cord: There is no spinal cord parenchymal signal abnormality. There are areas of low signal on the surface of the spinal cord at multiple locations, but this may be an artifact of motion. Paraspinal and other soft tissues: Unremarkable Disc levels: Degenerative disc disease is greatest at T10-11 where there is mild narrowing of the spinal canal. No other spinal canal stenosis. MRI LUMBAR SPINE FINDINGS Segmentation:  Standard. Alignment:  There is grade 1 anterolisthesis at L3-4. Vertebrae: There is a inferior endplate fracture of L1. No height loss or retropulsion. Mild bone marrow edema. The fracture likely extends to the anterior wall. There is a chronic L4 compression deformity. Conus medullaris and cauda equina: Poorly visualized due to motion Paraspinal and other soft tissues: Unremarkable Disc levels: No high-grade spinal canal stenosis. Limited assessment otherwise because of motion. IMPRESSION: 1. Motion degraded and truncated examination. 2. Acute inferior endplate fracture of L1 without height loss or retropulsion. Mild bone marrow edema. 3. Mild spinal canal stenosis at T10-11. 4. Chronic L4 compression deformity. Electronically Signed   By: Ulyses Jarred M.D.   On: 06/06/2021 23:15   MR LUMBAR SPINE WO CONTRAST  Result Date: 06/06/2021 CLINICAL DATA:  Severe back pain and lower extremity weakness EXAM: MRI THORACIC AND LUMBAR SPINE WITHOUT CONTRAST TECHNIQUE: Multiplanar and multiecho pulse sequences of the thoracic and lumbar spine were obtained without intravenous contrast. COMPARISON:  None. FINDINGS: Images are motion degraded and the study is truncated due to patient discomfort and inability to tolerate  the full length of the examination. MRI THORACIC SPINE FINDINGS Alignment:  Physiologic. Vertebrae: No fracture, evidence of discitis, or bone lesion. Cord: There is no spinal cord parenchymal signal abnormality. There are areas of low signal on  the surface of the spinal cord at multiple locations, but this may be an artifact of motion. Paraspinal and other soft tissues: Unremarkable Disc levels: Degenerative disc disease is greatest at T10-11 where there is mild narrowing of the spinal canal. No other spinal canal stenosis. MRI LUMBAR SPINE FINDINGS Segmentation:  Standard. Alignment:  There is grade 1 anterolisthesis at L3-4. Vertebrae: There is a inferior endplate fracture of L1. No height loss or retropulsion. Mild bone marrow edema. The fracture likely extends to the anterior wall. There is a chronic L4 compression deformity. Conus medullaris and cauda equina: Poorly visualized due to motion Paraspinal and other soft tissues: Unremarkable Disc levels: No high-grade spinal canal stenosis. Limited assessment otherwise because of motion. IMPRESSION: 1. Motion degraded and truncated examination. 2. Acute inferior endplate fracture of L1 without height loss or retropulsion. Mild bone marrow edema. 3. Mild spinal canal stenosis at T10-11. 4. Chronic L4 compression deformity. Electronically Signed   By: Ulyses Jarred M.D.   On: 06/06/2021 23:15   DG Chest Portable 1 View  Result Date: 06/06/2021 CLINICAL DATA:  Cough, hypoxia EXAM: PORTABLE CHEST 1 VIEW COMPARISON:  12/02/2015 FINDINGS: Heart size appears enlarged. Aortic atherosclerosis. Low lung volumes. Streaky right basilar opacity. Subtle interstitial opacities within the left lung base. No large pleural fluid collection. No pneumothorax. Degenerative changes of the shoulders. IMPRESSION: Low lung volumes with streaky right basilar opacity and subtle interstitial opacities within the left lung base. Findings may represent edema, infection, or atelectasis. Electronically Signed   By: Davina Poke D.O.   On: 06/06/2021 20:49    Assessment/Plan Principal Problem:   Closed L1 vertebral fracture  [name] has severe pain in her low back secondary to closed L1 vertebral fracture identified on  MRI of her L-spine.  She is given tramadol every 8 hours as needed for moderate pain and will be given Dilaudid 1 mg IV every 3 hours x3 doses for severe pain.  ER physician discussed with neurosurgery who reportedly stated this not require surgical intervention and to place a brace.  Neurosurgery will follow up with patient.  PT to evaluate patient in the morning Patient has no signs of cauda equina and is able to feel her legs and has no saddle anesthesia and is able to urinate normally  Active Problems:   Lumbago Secondary to L1 inferior endplate fracture.  Pain control as above     Hyperkalemia Patient given 1 dose of Lokelma.  Hold spironolactone.  Recheck electrolytes in morning    Diabetes mellitus type 2 in obese  Metformin twice a day.  Monitor blood sugars with meals and bedtime.  Corrective insulin provided as needed for glycemic control. Check hemoglobin A1c    CKD (chronic kidney disease), stage III Stable.  Monitor electrolytes and renal function    Essential hypertension Continue metoprolol.  Hold spironolactone with hyperkalemia  DVT prophylaxis: Pt is on xarelto for anticoagulation which is continued.   Code Status:   Full Code  Family Communication:  Diagnosis and plan discussed with patient.  Patient verbalized understanding agrees with plan.  Further recommendation follow as clinical indicated Disposition Plan:   Patient is from:  Assisted Living   Anticipated DC to:  Assisted living vs rehab  Anticipated DC date:  Anticipate  2 midnight or more stay  Time spent:   75 minutes  Admission status:  Inpatient   Yevonne Aline Denys Labree MD [facility]  How to contact the [facility] Attending or Consulting provider [place] or covering provider during after hours [place], for this patient?   Check the care team in [facility] and look for a) attending/consulting [facility] provider listed and b) the [facility] team listed Log into [email or web address] and use [place] universal password to  access. If you do not have the password, please contact the hospital operator. Locate the [facility] provider you are looking for under [facility] and page to a number that you can be directly reached. If you still have difficulty reaching the provider, please page the [facility] (Director on Call) for the Hospitalists listed on amion for assistance.  06/07/2021, 4:20 AM

## 2021-06-07 NOTE — ED Notes (Signed)
Ortho tech has been called and is aware of TLSO brace placement need

## 2021-06-08 DIAGNOSIS — E1169 Type 2 diabetes mellitus with other specified complication: Secondary | ICD-10-CM | POA: Diagnosis not present

## 2021-06-08 DIAGNOSIS — E875 Hyperkalemia: Secondary | ICD-10-CM

## 2021-06-08 DIAGNOSIS — M545 Low back pain, unspecified: Secondary | ICD-10-CM | POA: Diagnosis not present

## 2021-06-08 DIAGNOSIS — N1832 Chronic kidney disease, stage 3b: Secondary | ICD-10-CM | POA: Diagnosis not present

## 2021-06-08 DIAGNOSIS — S32019A Unspecified fracture of first lumbar vertebra, initial encounter for closed fracture: Secondary | ICD-10-CM | POA: Diagnosis not present

## 2021-06-08 LAB — BASIC METABOLIC PANEL
Anion gap: 8 (ref 5–15)
BUN: 27 mg/dL — ABNORMAL HIGH (ref 8–23)
CO2: 26 mmol/L (ref 22–32)
Calcium: 9.5 mg/dL (ref 8.9–10.3)
Chloride: 103 mmol/L (ref 98–111)
Creatinine, Ser: 1.75 mg/dL — ABNORMAL HIGH (ref 0.44–1.00)
GFR, Estimated: 29 mL/min — ABNORMAL LOW (ref >60)
Glucose, Bld: 146 mg/dL — ABNORMAL HIGH (ref 70–99)
Potassium: 5.5 mmol/L — ABNORMAL HIGH (ref 3.5–5.1)
Sodium: 137 mmol/L (ref 135–145)

## 2021-06-08 LAB — URINALYSIS, ROUTINE W REFLEX MICROSCOPIC
Bilirubin Urine: NEGATIVE
Glucose, UA: NEGATIVE mg/dL
Hgb urine dipstick: NEGATIVE
Ketones, ur: NEGATIVE mg/dL
Nitrite: NEGATIVE
Protein, ur: NEGATIVE mg/dL
Specific Gravity, Urine: 1.012 (ref 1.005–1.030)
pH: 5 (ref 5.0–8.0)

## 2021-06-08 LAB — GLUCOSE, CAPILLARY
Glucose-Capillary: 147 mg/dL — ABNORMAL HIGH (ref 70–99)
Glucose-Capillary: 155 mg/dL — ABNORMAL HIGH (ref 70–99)
Glucose-Capillary: 137 mg/dL — ABNORMAL HIGH (ref 70–99)
Glucose-Capillary: 119 mg/dL — ABNORMAL HIGH (ref 70–99)

## 2021-06-08 LAB — CBC
HCT: 40.5 % (ref 36.0–46.0)
Hemoglobin: 11.6 g/dL — ABNORMAL LOW (ref 12.0–15.0)
MCH: 27 pg (ref 26.0–34.0)
MCHC: 28.6 g/dL — ABNORMAL LOW (ref 30.0–36.0)
MCV: 94.4 fL (ref 80.0–100.0)
Platelets: 323 10*3/uL (ref 150–400)
RBC: 4.29 MIL/uL (ref 3.87–5.11)
RDW: 15.1 % (ref 11.5–15.5)
WBC: 7.2 10*3/uL (ref 4.0–10.5)
nRBC: 0 % (ref 0.0–0.2)

## 2021-06-08 LAB — MAGNESIUM: Magnesium: 2 mg/dL (ref 1.7–2.4)

## 2021-06-08 MED ORDER — GABAPENTIN 100 MG PO CAPS
200.0000 mg | ORAL_CAPSULE | Freq: Two times a day (BID) | ORAL | Status: DC
  Administered 2021-06-08 – 2021-06-19 (×20): 200 mg via ORAL
  Filled 2021-06-08 (×20): qty 2

## 2021-06-08 MED ORDER — SODIUM CHLORIDE 0.9 % IV SOLN
500.0000 mg | INTRAVENOUS | Status: DC
  Administered 2021-06-08 – 2021-06-09 (×2): 500 mg via INTRAVENOUS
  Filled 2021-06-08 (×2): qty 5

## 2021-06-08 MED ORDER — OXYCODONE-ACETAMINOPHEN 5-325 MG PO TABS
1.0000 | ORAL_TABLET | Freq: Four times a day (QID) | ORAL | Status: DC | PRN
  Administered 2021-06-08 – 2021-06-18 (×6): 1 via ORAL
  Filled 2021-06-08 (×7): qty 1

## 2021-06-08 MED ORDER — HYDROMORPHONE HCL 1 MG/ML IJ SOLN: 0.5000 mg | INTRAMUSCULAR | Status: DC | PRN

## 2021-06-08 MED ORDER — SODIUM CHLORIDE 0.9 % IV SOLN
2.0000 g | INTRAVENOUS | Status: DC
  Administered 2021-06-08 – 2021-06-10 (×3): 2 g via INTRAVENOUS
  Filled 2021-06-08 (×4): qty 20

## 2021-06-08 MED ORDER — SODIUM CHLORIDE 0.9 % IV SOLN: INTRAVENOUS | Status: DC

## 2021-06-08 MED ORDER — SODIUM ZIRCONIUM CYCLOSILICATE 10 G PO PACK
10.0000 g | PACK | Freq: Once | ORAL | Status: AC
  Administered 2021-06-08: 10 g via ORAL
  Filled 2021-06-08: qty 1

## 2021-06-08 NOTE — Progress Notes (Signed)
Inpatient Diabetes Program Recommendations  AACE/ADA: New Consensus Statement on Inpatient Glycemic Control (2015)  Target Ranges:  Prepandial:   less than 140 mg/dL      Peak postprandial:   less than 180 mg/dL (1-2 hours)      Critically ill patients:  140 - 180 mg/dL   Lab Results  Component Value Date   GLUCAP 155 (H) 06/08/2021   HGBA1C 7.2 (H) 06/07/2021    Review of Glycemic Control  Diabetes history: DM2 Outpatient Diabetes medications: metaglip 2.5/500 mg BID Current orders for Inpatient glycemic control: Novolog 0-15 units TID with meals and 0-5 HS  HgbA1C - 7.2% CBGs 147, 155 mg/dL  Inpatient Diabetes Program Recommendations:    Agree with orders.  Spoke with pt at bedside about her diabetes and glucose control at home. Told pt I understood she had some questions about her diabetes and wanted to see if I could answer them. She asked about how we knew she had diabetes and had questions about specific foods and whether or not she could have some cake, sweets once in a while. Answered all questions, discussed HgbA1C of 7.2% and told her this equates to an average blood sugar of 160 mg/dL. We discussed how what she does every day affects her health, not what she does once in a while. Pt was very appreciative of visit and asks me if I could come back tomorrow. Discussed with RN. Follow closely for needs.  Thank you. Lorenda Peck, RD, LDN, CDE Inpatient Diabetes Coordinator [phone]

## 2021-06-08 NOTE — Evaluation (Addendum)
Physical Therapy Evaluation Patient Details Name: [name] MRN: [id number] DOB: [birth year] Today's Date: 06/08/2021  History of Present Illness  Patient is 81 y.o. female with L1 endplate fracture. Neurosurgery who recommended nonsurgical intervention with TLSO and follow-up with neurosurgery. Per chart patient has been wheelchair-bound for the last 3 years but has been able to stand up to get in and out of bed to wheelchair. Pt was working with PT at facility for transfers until ~4 weeks ago.    Clinical Impression  [name] is 81 y.o. female admitted with above HPI and diagnosis. Patient is currently limited by functional impairments below (see PT problem list). Patient is a resident at [facility] and is primarily wheelchair bound at baseline with staff using hoyer lift for transfers to wheelchair. Patient reports ~1 month ago she was working with PT on sit<>stand transfers but since back pain has stopped. Patient evaluated by Physical Therapy with no further acute PT needs identified. Patient remains at baseline level of Max/Total assist for bed mobility and will require lift for transfers. Pt has poor rehab potential and should return to [name] with 24/7 assist/supervision. See below for any follow-up Physical Therapy or equipment needs. PT is signing off. Thank you for this referral.        Recommendations for follow up therapy are one component of a multi-disciplinary discharge planning process, led by the attending physician.  Recommendations may be updated based on patient status, additional functional criteria and insurance authorization.  Follow Up Recommendations  (Return to ALF)    Assistance Recommended at Discharge Frequent or constant Supervision/Assistance  Patient can return home with the following       Equipment Recommendations None recommended by PT  Recommendations for Other Services       Functional Status Assessment Patient has had a  recent decline in their functional status and/or demonstrates limited ability to make significant improvements in function in a reasonable and predictable amount of time     Precautions / Restrictions Precautions Precautions: Fall;Back Precaution Booklet Issued: No Precaution Comments: TLSO Required Braces or Orthoses: Spinal Brace Spinal Brace: Thoracolumbosacral orthotic Restrictions Weight Bearing Restrictions: No      Mobility  Bed Mobility Overal bed mobility: Needs Assistance Bed Mobility: Sit to Supine;Rolling;Sidelying to Sit Rolling: Max assist;Total assist;+2 for physical assistance;+2 for safety/equipment Sidelying to sit: Max assist;Total assist;+2 for physical assistance;+2 for safety/equipment   Sit to supine: Max assist;Total assist;+2 for physical assistance;+2 for safety/equipment   General bed mobility comments: Pt required assist to reach for bed rail and attempted to initiate reach and bringing LE's off EOB but unable to do so. Pt c/o pain with roll and attempt to sit up. Sat ~75% of the way up at EOB and then Max/Total +2 assist to return to supine and reposition in bed.    Transfers                        Ambulation/Gait                  Stairs            Wheelchair Mobility    Modified Rankin (Stroke Patients Only)       Balance  Pertinent Vitals/Pain Pain Assessment: Faces Faces Pain Scale: Hurts even more Pain Location: back pain with mobilty Pain Descriptors / Indicators: Grimacing;Moaning Pain Intervention(s): Limited activity within patient's tolerance;Repositioned    Home Living Family/patient expects to be discharged to:: Assisted living [facility])                   Additional Comments: pt has wheelchair, mechanical lift, RW, hospital bed.    Prior Function Prior Level of Function : Patient poor historian/Family not available              Mobility Comments: per chart reviewe 2 years ago pt was 2+ Max Assist to attempt stand with Stedy. Pt has been primarily wheelchair bound for ~3 years and the staff at [name] uses a hoyer lift to transfer pt in and out of bed. ADLs Comments: Total Assist     Hand Dominance        Extremity/Trunk Assessment   Upper Extremity Assessment Upper Extremity Assessment: Generalized weakness    Lower Extremity Assessment Lower Extremity Assessment: Generalized weakness    Cervical / Trunk Assessment Cervical / Trunk Assessment: Other exceptions Cervical / Trunk Exceptions: L1 fracture  Communication   Communication: No difficulties  Cognition Arousal/Alertness: Awake/alert Behavior During Therapy: WFL for tasks assessed/performed Overall Cognitive Status: No family/caregiver present to determine baseline cognitive functioning                                          General Comments      Exercises     Assessment/Plan    PT Assessment Patient does not need any further PT services  PT Problem List         PT Treatment Interventions      PT Goals (Current goals can be found in the Care Plan section)  Acute Rehab PT Goals Patient Stated Goal: na PT Goal Formulation: All assessment and education complete, DC therapy Time For Goal Achievement: 06/08/21 Potential to Achieve Goals: Poor    Frequency       Co-evaluation               AM-PAC PT "6 Clicks" Mobility  Outcome Measure Help needed turning from your back to your side while in a flat bed without using bedrails?: Total Help needed moving from lying on your back to sitting on the side of a flat bed without using bedrails?: Total Help needed moving to and from a bed to a chair (including a wheelchair)?: Total Help needed standing up from a chair using your arms (e.g., wheelchair or bedside chair)?: Total Help needed to walk in hospital room?: Total Help needed climbing  3-5 steps with a railing? : Total 6 Click Score: 6    End of Session Equipment Utilized During Treatment: Back brace Activity Tolerance: Patient limited by pain Patient left: in bed;with call bell/phone within reach;with bed alarm set Nurse Communication: Mobility status;Need for lift equipment PT Visit Diagnosis: Muscle weakness (generalized) (M62.81);Difficulty in walking, not elsewhere classified (R26.2);Other abnormalities of gait and mobility (R26.89);Pain Pain - part of body:  (back)    Time: 1440-1455 PT Time Calculation (min) (ACUTE ONLY): 15 min   Charges:   PT Evaluation $PT Eval Low Complexity: 1 Low          Verner Mould, DPT Acute Rehabilitation Services Office [phone] Pager [phone]   Jacques Navy  06/08/2021, 4:21 PM

## 2021-06-08 NOTE — Progress Notes (Signed)
PROGRESS NOTE  [name]:[id number] DOB: 1940-06-06 DOA: 06/06/2021 PCP: Willey Blade, MD   LOS: 1 day   Brief narrative:  [name] is a 81 y.o. female with medical history of diabetes type 2, hypertension, rheumatoid arthritis, breast and uterine cancer, chronic back pain, hyperlipidemia presented to hospital with low back pain.  Patient is pretty much wheelchair-bound for the last 3 years and is currently at assisted living facility.  Patient was trying to reach out to the therapist when she felt a pop in her lower back and has been having pain since then.  MRI was obtained in the ED which showed the L1 inferior endplate fracture.  ED provider spoke with neurosurgery Dr. Venetia Constable who recommended TSL0 versus and no surgical intervention.  In the ED, patient was also noted to have hyperkalemia and is on spironolactone as outpatient with a stage IIIb.    Assessment/Plan:  Principal Problem:   Closed L1 vertebral fracture (HCC) Active Problems:   Essential hypertension   Diabetes mellitus type 2 in obese (HCC)   CKD (chronic kidney disease), stage III (HCC)   Lumbago   Hyperkalemia   Pressure injury of skin  Closed L1 vertebral fracture with back pain.   Continue pain management.  ER had spoken with neurosurgery who recommended nonsurgical intervention with brace and follow-up with neurosurgery.  Await PT evaluation.  Patient has received pain medication but developed some delirium and confusion.  We will communicate with IR for potential kyphoplasty.  We will hold the Eliquis the patient was on for now.  Low-grade fever noted today.  We will continue to monitor closely.  COVID and influenza was negative.  Lactate was within normal limits.  No leukocytosis.  We will check a UA. Urinalysis does not show any obvious signs of infection.  Chest x-ray showed some low lung volumes with basilar opacity and interstitial opacities in the left lung.  Secondary to fever and  possible need for intervention we will treat for possible community-acquired pneumonia.  Confusion, delirious state this morning likely multifactorial from hospital induced delirium, febrile episode, polypharmacy.  X-ray of the chest with some basilar opacities.  Possibility of pneumonia.  We will cut down on sedative hypnotics and narcotics for now.  Discontinue IV Dilaudid.  Put on Percocet for now.  We will add Lidoderm patch on the back.  Continue lumbar corset to address the pain.  Will resume some IV fluids due to elevated creatinine levels and possible volume depletion.  Hyperkalemia. Potassium 5.5.  Will receive 1 dose of Lokelma again today.  Resume some IV fluids today. Diabetes mellitus type 2.  Continue sliding scale insulin Accu-Cheks.  POC glucose of 147   Mild acute kidney injury on stage IIIb CKD.  We will continue to monitor renal function closely.  Latest creatinine of 1.7.  Spoke with the patient's son about it.  We will start the patient on gentle IV fluids today.   Essential hypertension.  Continue metoprolol.  Spironolactone on hold due to hyperkalemia.  Last blood pressure 148/68   Right buttocks stage II pressure injury present on admission.  Continue wound care.  DVT prophylaxis:  apixaban (ELIQUIS) tablet 5 mg will hold for now.   Code Status: Full code  Family Communication:  Spoke with the patient's son on the phone on 06/08/2021 and updated him about the clinical condition of the patient.  Status is: Inpatient  Remains inpatient appropriate because:  Ambulatory dysfunction, intractable pain, possible need for rehabilitation  placement, PT evaluation, hyperkalemia, possible need for kyphoplasty.  Consultants: Neurosurgery We will consult IR for potential kyphoplasty  Procedures: TSLO brace application  Anti-infectives:  None  Anti-infectives (From admission, onward)    None       Subjective: Today, patient was seen and examined at bedside.   Nursing staff reported that the patient was confused.  Patient's son stated that she was very confused and disoriented this morning and is very concerned about it.  Objective: Vitals:   06/07/21 1950 06/08/21 0439  BP: (!) 150/86 (!) 148/68  Pulse: 88 86  Resp: 16 16  Temp: 97.8 F (36.6 C) (!) 100.4 F (38 C)  SpO2: 100% 94%    Intake/Output Summary (Last 24 hours) at 06/08/2021 0743 Last data filed at 06/08/2021 0631 Gross per 24 hour  Intake 869.67 ml  Output 600 ml  Net 269.67 ml   Filed Weights   06/06/21 1852  Weight: 136.1 kg   Body mass index is 49.92 kg/m.   Physical Exam:  GENERAL: Patient is alert awake and communicative, oriented to place and person, not in obvious distress.  Morbidly obese HENT: No scleral pallor or icterus. Pupils equally reactive to light. Oral mucosa is moist NECK: is supple, no gross swelling noted. CHEST: Clear to auscultation. No crackles or wheezes.  Diminished breath sounds bilaterally.  T SLO brace in place. CVS: S1 and S2 heard, no murmur. Regular rate and rhythm.  ABDOMEN: Soft, non-tender, bowel sounds are present.  EXTREMITIES: No edema. CNS: Cranial nerves are intact.  Moving extremities, alert awake and communicative at this time.  Confused at times. SKIN: warm and dry without rashes.  Data Review: I have personally reviewed the following laboratory data and studies,  CBC: Recent Labs  Lab 06/06/21 2002 06/07/21 0536 06/08/21 0518  WBC 7.7 7.1 7.2  NEUTROABS 4.9  --   --   HGB 12.1 12.0 11.6*  HCT 40.4 41.2 40.5  MCV 90.8 92.6 94.4  PLT 326 314 102   Basic Metabolic Panel: Recent Labs  Lab 06/06/21 2002 06/07/21 0536 06/08/21 0518  NA 137 136 137  K 5.5* 4.6 5.5*  CL 107 107 103  CO2 25 25 26   GLUCOSE 91 119* 146*  BUN 31* 27* 27*  CREATININE 1.68* 1.59* 1.75*  CALCIUM 9.1 9.1 9.5  MG  --   --  2.0   Liver Function Tests: Recent Labs  Lab 06/06/21 2002  AST 28  ALT 16  ALKPHOS 46  BILITOT 1.0   PROT 7.5  ALBUMIN 3.5   No results for input(s): LIPASE, AMYLASE in the last 168 hours. No results for input(s): AMMONIA in the last 168 hours. Cardiac Enzymes: No results for input(s): CKTOTAL, CKMB, CKMBINDEX, TROPONINI in the last 168 hours. BNP (last 3 results) No results for input(s): BNP in the last 8760 hours.  ProBNP (last 3 results) No results for input(s): PROBNP in the last 8760 hours.  CBG: Recent Labs  Lab 06/07/21 0747 06/07/21 1159 06/07/21 1639 06/07/21 2051 06/08/21 0734  GLUCAP 136* 148* 139* 138* 147*   Recent Results (from the past 240 hour(s))  Resp Panel by RT-PCR (Flu A&B, Covid) Nasopharyngeal Swab     Status: None   Collection Time: 06/06/21  8:02 PM   Specimen: Nasopharyngeal Swab; Nasopharyngeal(NP) swabs in vial transport medium  Result Value Ref Range Status   SARS Coronavirus 2 by RT PCR NEGATIVE NEGATIVE Final    Comment: (NOTE) SARS-CoV-2 target nucleic acids are NOT DETECTED.  The SARS-CoV-2 RNA is generally detectable in upper respiratory specimens during the acute phase of infection. The lowest concentration of SARS-CoV-2 viral copies this assay can detect is 138 copies/mL. A negative result does not preclude SARS-Cov-2 infection and should not be used as the sole basis for treatment or other patient management decisions. A negative result may occur with  improper specimen collection/handling, submission of specimen other than nasopharyngeal swab, presence of viral mutation(s) within the areas targeted by this assay, and inadequate number of viral copies(<138 copies/mL). A negative result must be combined with clinical observations, patient history, and epidemiological information. The expected result is Negative.  Fact Sheet for Patients:  EntrepreneurPulse.com.au  Fact Sheet for Healthcare Providers:  IncredibleEmployment.be  This test is no t yet approved or cleared by the [place] FDA  and  has been authorized for detection and/or diagnosis of SARS-CoV-2 by FDA under an Emergency Use Authorization (EUA). This EUA will remain  in effect (meaning this test can be used) for the duration of the COVID-19 declaration under Section 564(b)(1) of the Act, 21 U.S.C.section 360bbb-3(b)(1), unless the authorization is terminated  or revoked sooner.       Influenza A by PCR NEGATIVE NEGATIVE Final   Influenza B by PCR NEGATIVE NEGATIVE Final    Comment: (NOTE) The Xpert Xpress SARS-CoV-2/FLU/RSV plus assay is intended as an aid in the diagnosis of influenza from Nasopharyngeal swab specimens and should not be used as a sole basis for treatment. Nasal washings and aspirates are unacceptable for Xpert Xpress SARS-CoV-2/FLU/RSV testing.  Fact Sheet for Patients: EntrepreneurPulse.com.au  Fact Sheet for Healthcare Providers: IncredibleEmployment.be  This test is not yet approved or cleared by the [place] FDA and has been authorized for detection and/or diagnosis of SARS-CoV-2 by FDA under an Emergency Use Authorization (EUA). This EUA will remain in effect (meaning this test can be used) for the duration of the COVID-19 declaration under Section 564(b)(1) of the Act, 21 U.S.C. section 360bbb-3(b)(1), unless the authorization is terminated or revoked.  Performed at [facility], [place] [address], [place], [place] [address]      Studies: MR THORACIC SPINE WO CONTRAST  Result Date: 06/06/2021 CLINICAL DATA:  Severe back pain and lower extremity weakness EXAM: MRI THORACIC AND LUMBAR SPINE WITHOUT CONTRAST TECHNIQUE: Multiplanar and multiecho pulse sequences of the thoracic and lumbar spine were obtained without intravenous contrast. COMPARISON:  None. FINDINGS: Images are motion degraded and the study is truncated due to patient discomfort and inability to tolerate the full length of the examination. MRI THORACIC SPINE  FINDINGS Alignment:  Physiologic. Vertebrae: No fracture, evidence of discitis, or bone lesion. Cord: There is no spinal cord parenchymal signal abnormality. There are areas of low signal on the surface of the spinal cord at multiple locations, but this may be an artifact of motion. Paraspinal and other soft tissues: Unremarkable Disc levels: Degenerative disc disease is greatest at T10-11 where there is mild narrowing of the spinal canal. No other spinal canal stenosis. MRI LUMBAR SPINE FINDINGS Segmentation:  Standard. Alignment:  There is grade 1 anterolisthesis at L3-4. Vertebrae: There is a inferior endplate fracture of L1. No height loss or retropulsion. Mild bone marrow edema. The fracture likely extends to the anterior wall. There is a chronic L4 compression deformity. Conus medullaris and cauda equina: Poorly visualized due to motion Paraspinal and other soft tissues: Unremarkable Disc levels: No high-grade spinal canal stenosis. Limited assessment otherwise because of motion. IMPRESSION: 1. Motion degraded and truncated  examination. 2. Acute inferior endplate fracture of L1 without height loss or retropulsion. Mild bone marrow edema. 3. Mild spinal canal stenosis at T10-11. 4. Chronic L4 compression deformity. Electronically Signed   By: Ulyses Jarred M.D.   On: 06/06/2021 23:15   MR LUMBAR SPINE WO CONTRAST  Result Date: 06/06/2021 CLINICAL DATA:  Severe back pain and lower extremity weakness EXAM: MRI THORACIC AND LUMBAR SPINE WITHOUT CONTRAST TECHNIQUE: Multiplanar and multiecho pulse sequences of the thoracic and lumbar spine were obtained without intravenous contrast. COMPARISON:  None. FINDINGS: Images are motion degraded and the study is truncated due to patient discomfort and inability to tolerate the full length of the examination. MRI THORACIC SPINE FINDINGS Alignment:  Physiologic. Vertebrae: No fracture, evidence of discitis, or bone lesion. Cord: There is no spinal cord parenchymal signal  abnormality. There are areas of low signal on the surface of the spinal cord at multiple locations, but this may be an artifact of motion. Paraspinal and other soft tissues: Unremarkable Disc levels: Degenerative disc disease is greatest at T10-11 where there is mild narrowing of the spinal canal. No other spinal canal stenosis. MRI LUMBAR SPINE FINDINGS Segmentation:  Standard. Alignment:  There is grade 1 anterolisthesis at L3-4. Vertebrae: There is a inferior endplate fracture of L1. No height loss or retropulsion. Mild bone marrow edema. The fracture likely extends to the anterior wall. There is a chronic L4 compression deformity. Conus medullaris and cauda equina: Poorly visualized due to motion Paraspinal and other soft tissues: Unremarkable Disc levels: No high-grade spinal canal stenosis. Limited assessment otherwise because of motion. IMPRESSION: 1. Motion degraded and truncated examination. 2. Acute inferior endplate fracture of L1 without height loss or retropulsion. Mild bone marrow edema. 3. Mild spinal canal stenosis at T10-11. 4. Chronic L4 compression deformity. Electronically Signed   By: Ulyses Jarred M.D.   On: 06/06/2021 23:15   DG Chest Portable 1 View  Result Date: 06/06/2021 CLINICAL DATA:  Cough, hypoxia EXAM: PORTABLE CHEST 1 VIEW COMPARISON:  12/02/2015 FINDINGS: Heart size appears enlarged. Aortic atherosclerosis. Low lung volumes. Streaky right basilar opacity. Subtle interstitial opacities within the left lung base. No large pleural fluid collection. No pneumothorax. Degenerative changes of the shoulders. IMPRESSION: Low lung volumes with streaky right basilar opacity and subtle interstitial opacities within the left lung base. Findings may represent edema, infection, or atelectasis. Electronically Signed   By: Davina Poke D.O.   On: 06/06/2021 20:49      Flora Lipps, MD  [facility] 06/08/2021  If 7PM-7AM, please contact night-coverage

## 2021-06-08 NOTE — Progress Notes (Addendum)
0745 Pt assessed, pt A&Ox4, laying in bed wearing TSLO brace. NAD, no complaints, updated with POC, WCTM.  0930 RN received phone call from son stating the patient called him and was confused. Stated he wanted the RN to page the MD, RN messaged MD, awaiting response.   0945 RN to room, pt alert, oriented to name, place. Confused about relatives. RN called son and son was questioning patient about earlier phone call. Pt seems forgetful. Spoke to MD, wants to try to cut back pain meds due to confusion. Pt had large watery BM, patient given bath and full linen change. NAD, WCTM.   1100 Pt medicated per MAR, VS taken and oxygen noted to be in the high 80s. Oxygen applied, WCTM.   [place] Dr. Louanne Belton at bedside assessing patient. No new orders received, stated he would call son for an update.   62 Pt had visitor at bedside. Pt confused stating no one has told her why she's here and asking for her wheelchair. RN has re-oriented pt multiple times and let her know why she's here and that her wheelchair is at her living facility. Pt assisted with repositioning, TLSO brace re-adjusted and pt pulled up in the bed to eat lunch. No needs at this time, WCTM.

## 2021-06-09 DIAGNOSIS — M545 Low back pain, unspecified: Secondary | ICD-10-CM | POA: Diagnosis not present

## 2021-06-09 DIAGNOSIS — S32019A Unspecified fracture of first lumbar vertebra, initial encounter for closed fracture: Secondary | ICD-10-CM | POA: Diagnosis not present

## 2021-06-09 DIAGNOSIS — E1169 Type 2 diabetes mellitus with other specified complication: Secondary | ICD-10-CM | POA: Diagnosis not present

## 2021-06-09 DIAGNOSIS — N1832 Chronic kidney disease, stage 3b: Secondary | ICD-10-CM | POA: Diagnosis not present

## 2021-06-09 LAB — BASIC METABOLIC PANEL
Anion gap: 8 (ref 5–15)
BUN: 29 mg/dL — ABNORMAL HIGH (ref 8–23)
CO2: 26 mmol/L (ref 22–32)
Calcium: 9.1 mg/dL (ref 8.9–10.3)
Chloride: 103 mmol/L (ref 98–111)
Creatinine, Ser: 1.69 mg/dL — ABNORMAL HIGH (ref 0.44–1.00)
GFR, Estimated: 31 mL/min — ABNORMAL LOW (ref >60)
Glucose, Bld: 124 mg/dL — ABNORMAL HIGH (ref 70–99)
Potassium: 4.6 mmol/L (ref 3.5–5.1)
Sodium: 137 mmol/L (ref 135–145)

## 2021-06-09 LAB — HEPARIN LEVEL (UNFRACTIONATED)
Heparin Unfractionated: 0.49 IU/mL (ref 0.30–0.70)
Heparin Unfractionated: 0.78 IU/mL — ABNORMAL HIGH (ref 0.30–0.70)

## 2021-06-09 LAB — GLUCOSE, CAPILLARY
Glucose-Capillary: 188 mg/dL — ABNORMAL HIGH (ref 70–99)
Glucose-Capillary: 144 mg/dL — ABNORMAL HIGH (ref 70–99)
Glucose-Capillary: 145 mg/dL — ABNORMAL HIGH (ref 70–99)
Glucose-Capillary: 149 mg/dL — ABNORMAL HIGH (ref 70–99)

## 2021-06-09 LAB — APTT: aPTT: 31 seconds (ref 24–36)

## 2021-06-09 LAB — CBC
HCT: 38.9 % (ref 36.0–46.0)
Hemoglobin: 11 g/dL — ABNORMAL LOW (ref 12.0–15.0)
MCH: 26.7 pg (ref 26.0–34.0)
MCHC: 28.3 g/dL — ABNORMAL LOW (ref 30.0–36.0)
MCV: 94.4 fL (ref 80.0–100.0)
Platelets: 305 10*3/uL (ref 150–400)
RBC: 4.12 MIL/uL (ref 3.87–5.11)
RDW: 15.1 % (ref 11.5–15.5)
WBC: 6.8 10*3/uL (ref 4.0–10.5)
nRBC: 0 % (ref 0.0–0.2)

## 2021-06-09 LAB — MAGNESIUM: Magnesium: 2 mg/dL (ref 1.7–2.4)

## 2021-06-09 MED ORDER — HEPARIN (PORCINE) 25000 UT/250ML-% IV SOLN
1300.0000 [IU]/h | INTRAVENOUS | Status: AC
  Administered 2021-06-09: 12:00:00 1450 [IU]/h via INTRAVENOUS
  Administered 2021-06-10 – 2021-06-11 (×3): 1300 [IU]/h via INTRAVENOUS
  Filled 2021-06-09 (×5): qty 250

## 2021-06-09 MED ORDER — SODIUM CHLORIDE 0.9 % IV SOLN: INTRAVENOUS | Status: DC

## 2021-06-09 NOTE — Progress Notes (Signed)
Pharmacy: Re- heparin  Patient's 81 year old female with notable PMH of saddle PE (2017) on Eliquis 5mg  PO BID presents with back pain after working with therapy.  MRI in the ED obtained which showed L1 inferior endplate fracture with no cord involvement.  NSGY advised for TLSO brace to be placed and no surgical intervention needed.  TRH discussing with IR for the possibility of kyphoplasty.  Eliquis has been held - last dose 1/16 @ 1059.  Pharmacy has been consulted to dose heparin drip while Eliquis is on hold  - first heparin level is supra-therapeutic at 0.78 with rate infusing at 1450 units/hr - per pt's RN, no issue with IV line and no bleeding noted  Goal of Therapy:  Heparin level 0.3-0.7 units/ml aPTT 66-102 seconds Monitor platelets by anticoagulation protocol: Yes  Plan: - Reduce heparin drip to 1300 units/hr - check 8 hr heparin level - monitor for s/sx bleeding  Dia Sitter, PharmD, BCPS 06/09/2021 8:52 PM

## 2021-06-09 NOTE — Progress Notes (Addendum)
PROGRESS NOTE  [name]:[id number] DOB: 1941/05/16 DOA: 06/06/2021 PCP: Willey Blade, MD   LOS: 2 days   Brief narrative:  [name] is a 81 y.o. female with medical history of diabetes type 2, hypertension, rheumatoid arthritis, breast and uterine cancer, chronic back pain, hyperlipidemia presented to hospital with low back pain.  Patient is pretty much wheelchair-bound for the last 3 years and is currently at assisted living facility.  Patient was trying to reach out to the therapist when she felt a pop in her lower back and has been having pain since then.  MRI was obtained in the ED which showed the L1 inferior endplate fracture.  ED provider spoke with neurosurgery Dr. Venetia Constable who recommended TSL0 versus and no surgical intervention.  In the ED, patient was also noted to have hyperkalemia and was on spironolactone as outpatient with a stage IIIb.   At this time, patient is being managed with pain medication, TSLO brace but continues to have severe pain and had a lot of confusion and disorientation with narcotics.  IR has been consulted for possible kyphoplasty.   Assessment/Plan:  Principal Problem:   Closed L1 vertebral fracture (HCC) Active Problems:   Essential hypertension   Diabetes mellitus type 2 in obese (HCC)   CKD (chronic kidney disease), stage III (HCC)   Lumbago   Hyperkalemia   Pressure injury of skin  Closed L1 vertebral fracture with continued back pain.   Neurosurgery recommended T SLO brace with pain management but patient developed delirium and confusion with narcotics.Marland Kitchen  Spoke with the patient's son who wishes HAART basis for kyphoplasty.  Spoke with Dr Serafina Royals, interventional radiology who is reviewing the case and considering possible kyphoplasty on Friday.  We will continue to hold Eliquis.    Low-grade fever.  COVID and influenza was negative.  Lactate was within normal limits.  No leukocytosis.   Urinalysis does not show any obvious  signs of infection.  Chest x-ray showed some low lung volumes with basilar opacity and interstitial opacities in the left lung.  On Rocephin and Zithromax for possible pneumonia.  Confusion, delirious state on 06/08/2021 likely multifactorial from hospital induced delirium, febrile episode, polypharmacy.  X-ray of the chest with some basilar opacities.  Possibility of pneumonia.  We will cut down on sedative hypnotics and narcotics for now.  Off IV Dilaudid.  Continue Percocet for now.   Continue lumbar brace..  Received some IV fluids.  Improved status today.  Hyperkalemia. Improved after Lokelma yesterday.  We will continue to monitor  Diabetes mellitus type 2.  Continue sliding scale insulin Accu-Cheks.  POC glucose of 188   CKD stage IIIb .  AKI ruled out. We will continue to monitor renal function closely.  Latest creatinine of 1.6.  DC iv fluids   Essential hypertension.  Continue metoprolol.  Spironolactone on hold due to hyperkalemia.  Last blood pressure 126/65   Right buttocks stage II pressure injury present on admission.  Continue wound care.  History of VTE/saddle pulmonary embolism.  Eliquis on hold since yesterday.  Will start on heparin drip   DVT prophylaxis:  Eliquis on hold.  We will continue with heparin drip.   Code Status: Full code  Family Communication:  Spoke with the patient's son on the phone on 06/09/2021 and updated him about the clinical condition of the patient.  Status is: Inpatient  Remains inpatient appropriate because:  Ambulatory dysfunction, intractable pain, possible need for rehabilitation placement, PT evaluation, hyperkalemia, possible  need for kyphoplasty.  Consultants: Neurosurgery-  Verbal IR for potential kyphoplasty  Procedures: TSLO brace application  Anti-infectives:  Rocephin and Zithromax 1/16>  Anti-infectives (From admission, onward)    Start     Dose/Rate Route Frequency Ordered Stop   06/08/21 1600  cefTRIAXone (ROCEPHIN)  2 g in sodium chloride 0.9 % 100 mL IVPB        2 g 200 mL/hr over 30 Minutes Intravenous Every 24 hours 06/08/21 1459     06/08/21 1600  azithromycin (ZITHROMAX) 500 mg in sodium chloride 0.9 % 250 mL IVPB        500 mg 250 mL/hr over 60 Minutes Intravenous Every 24 hours 06/08/21 1459        Subjective: Today, patient was seen and examined at bedside.  Still complains of mild back pain.  No bouts of confusion today.    Objective: Vitals:   06/09/21 0000 06/09/21 0446  BP:  126/65  Pulse:  84  Resp:  16  Temp:  97.9 F (36.6 C)  SpO2: 91% 91%    Intake/Output Summary (Last 24 hours) at 06/09/2021 1047 Last data filed at 06/09/2021 0944 Gross per 24 hour  Intake 2519.04 ml  Output 550 ml  Net 1969.04 ml    Filed Weights   06/06/21 1852  Weight: 136.1 kg   Body mass index is 49.92 kg/m.   Physical Exam:  General: Morbidly obese built, not in obvious distress HENT:   No scleral pallor or icterus noted. Oral mucosa is moist.  Chest:  Clear breath sounds.  Diminished breath sounds bilaterally. No crackles or wheezes.  T SLO brace in place. CVS: S1 &S2 heard. No murmur.  Regular rate and rhythm. Abdomen: Soft, nontender, nondistended.  Bowel sounds are heard.   Extremities: No cyanosis, clubbing or edema.  Peripheral pulses are palpable. Psych: Alert, awake and oriented to place and person normal mood CNS:  No cranial nerve deficits.  Power equal in all extremities.   Skin: Warm and dry.  No rashes noted.  Data Review: I have personally reviewed the following laboratory data and studies,  CBC: Recent Labs  Lab 06/06/21 2002 06/07/21 0536 06/08/21 0518 06/09/21 0503  WBC 7.7 7.1 7.2 6.8  NEUTROABS 4.9  --   --   --   HGB 12.1 12.0 11.6* 11.0*  HCT 40.4 41.2 40.5 38.9  MCV 90.8 92.6 94.4 94.4  PLT 326 314 323 062    Basic Metabolic Panel: Recent Labs  Lab 06/06/21 2002 06/07/21 0536 06/08/21 0518 06/09/21 0503  NA 137 136 137 137  K 5.5* 4.6 5.5* 4.6   CL 107 107 103 103  CO2 25 25 26 26   GLUCOSE 91 119* 146* 124*  BUN 31* 27* 27* 29*  CREATININE 1.68* 1.59* 1.75* 1.69*  CALCIUM 9.1 9.1 9.5 9.1  MG  --   --  2.0 2.0    Liver Function Tests: Recent Labs  Lab 06/06/21 2002  AST 28  ALT 16  ALKPHOS 46  BILITOT 1.0  PROT 7.5  ALBUMIN 3.5    No results for input(s): LIPASE, AMYLASE in the last 168 hours. No results for input(s): AMMONIA in the last 168 hours. Cardiac Enzymes: No results for input(s): CKTOTAL, CKMB, CKMBINDEX, TROPONINI in the last 168 hours. BNP (last 3 results) No results for input(s): BNP in the last 8760 hours.  ProBNP (last 3 results) No results for input(s): PROBNP in the last 8760 hours.  CBG: Recent Labs  Lab 06/08/21  6203 06/08/21 1153 06/08/21 1610 06/08/21 2026 06/09/21 0748  GLUCAP 147* 155* 137* 119* 188*    Recent Results (from the past 240 hour(s))  Resp Panel by RT-PCR (Flu A&B, Covid) Nasopharyngeal Swab     Status: None   Collection Time: 06/06/21  8:02 PM   Specimen: Nasopharyngeal Swab; Nasopharyngeal(NP) swabs in vial transport medium  Result Value Ref Range Status   SARS Coronavirus 2 by RT PCR NEGATIVE NEGATIVE Final    Comment: (NOTE) SARS-CoV-2 target nucleic acids are NOT DETECTED.  The SARS-CoV-2 RNA is generally detectable in upper respiratory specimens during the acute phase of infection. The lowest concentration of SARS-CoV-2 viral copies this assay can detect is 138 copies/mL. A negative result does not preclude SARS-Cov-2 infection and should not be used as the sole basis for treatment or other patient management decisions. A negative result may occur with  improper specimen collection/handling, submission of specimen other than nasopharyngeal swab, presence of viral mutation(s) within the areas targeted by this assay, and inadequate number of viral copies(<138 copies/mL). A negative result must be combined with clinical observations, patient history, and  epidemiological information. The expected result is Negative.  Fact Sheet for Patients:  EntrepreneurPulse.com.au  Fact Sheet for Healthcare Providers:  IncredibleEmployment.be  This test is no t yet approved or cleared by the [place] FDA and  has been authorized for detection and/or diagnosis of SARS-CoV-2 by FDA under an Emergency Use Authorization (EUA). This EUA will remain  in effect (meaning this test can be used) for the duration of the COVID-19 declaration under Section 564(b)(1) of the Act, 21 U.S.C.section 360bbb-3(b)(1), unless the authorization is terminated  or revoked sooner.       Influenza A by PCR NEGATIVE NEGATIVE Final   Influenza B by PCR NEGATIVE NEGATIVE Final    Comment: (NOTE) The Xpert Xpress SARS-CoV-2/FLU/RSV plus assay is intended as an aid in the diagnosis of influenza from Nasopharyngeal swab specimens and should not be used as a sole basis for treatment. Nasal washings and aspirates are unacceptable for Xpert Xpress SARS-CoV-2/FLU/RSV testing.  Fact Sheet for Patients: EntrepreneurPulse.com.au  Fact Sheet for Healthcare Providers: IncredibleEmployment.be  This test is not yet approved or cleared by the [place] FDA and has been authorized for detection and/or diagnosis of SARS-CoV-2 by FDA under an Emergency Use Authorization (EUA). This EUA will remain in effect (meaning this test can be used) for the duration of the COVID-19 declaration under Section 564(b)(1) of the Act, 21 U.S.C. section 360bbb-3(b)(1), unless the authorization is terminated or revoked.  Performed at [facility], [place] [address], [place], [place] [address]       Studies: No results found.    Flora Lipps, MD  [facility] 06/09/2021  If 7PM-7AM, please contact night-coverage

## 2021-06-09 NOTE — Progress Notes (Addendum)
ANTICOAGULATION CONSULT NOTE - Initial Consult  Pharmacy Consult for Eliquis >> heparin drip Indication: history of saddle PE  Allergies  Allergen Reactions   Iodine Rash    Patient Measurements: Height: 5\' 5"  (165.1 cm) Weight: 136.1 kg (300 lb) IBW/kg (Calculated) : 57 Heparin Dosing Weight: 90.7 kg  Vital Signs: Temp: 97.9 F (36.6 C) (01/17 0446) Temp Source: Oral (01/17 0446) BP: 126/65 (01/17 0446) Pulse Rate: 84 (01/17 0446)  Labs: Recent Labs    06/07/21 0536 06/08/21 0518 06/09/21 0503  HGB 12.0 11.6* 11.0*  HCT 41.2 40.5 38.9  PLT 314 323 305  CREATININE 1.59* 1.75* 1.69*    Estimated Creatinine Clearance: 38.4 mL/min (A) (by C-G formula based on SCr of 1.69 mg/dL (H)).   Medical History: Past Medical History:  Diagnosis Date   Allergy    Arthritis    "joints; mostly on the right side" (01/11/2013)   Breast cancer ([place])    "left" (01/11/2013)   Cancer ([place])    endometrial   Chronic lower back pain    Cold    just started 9/62/95 cough   Complication of anesthesia    very phobic about needles.last surgery had to sedate before doing iv   Depression    Exertional shortness of breath    Hyperlipidemia    Hypertension    Osteoporosis    knees    Personal history of radiation therapy 03/2013   left   Status post radiation therapy within last four weeks 03/19/13-05/03/13   lt breast 60.4GY   Uterine cancer (HCC)     Medications:  Scheduled:   acidophilus  1 capsule Oral Daily   benzonatate  200 mg Oral TID   bisacodyl  10 mg Rectal Daily   buPROPion  150 mg Oral BID   docusate sodium  100 mg Oral Daily   donepezil  5 mg Oral Daily   DULoxetine  20 mg Oral Daily   exemestane  25 mg Oral Daily   famotidine  20 mg Oral BID   gabapentin  200 mg Oral BID   insulin aspart  0-15 Units Subcutaneous TID WC & HS   linaclotide  72 mcg Oral q morning   metoprolol succinate  50 mg Oral BID   senna-docusate  1 tablet Oral BID    Assessment: 81  year old female with notable PMH of saddle PE (2017) on Eliquis 5mg  PO BID presents with back pain after working with therapy.  MRI in the ED obtained which showed L1 inferior endplate fracture with no cord involvement.  NSGY advised for TLSO brace to be placed and no surgical intervention needed.  TRH discussing with IR for the possibility of kyphoplasty.  Eliquis has been held - last dose 1/16 @ 1059.  Pharmacy has been consulted to dose heparin drip while Eliquis is on hold.  Today, 06/09/21 - Hgb stable 11's, plts stable wnl - Baseline HL 0.49 and aPTT 31 seconds; can proceed with dosing heparin using HL as both values are correlating  Goal of Therapy:  Heparin level 0.3-0.7 units/ml aPTT 66-102 seconds Monitor platelets by anticoagulation protocol: Yes   Plan:  No heparin bolus Start heparin drip at 1450 units/hour Check 8 hour HL Follow-up plans for kyphoplasty and ability to transition back to [place], PharmD 06/09/2021 10:27 AM

## 2021-06-09 NOTE — Progress Notes (Addendum)
Referring Physician(s): Pokhrel,L  Supervising Physician: Ruthann Cancer  Patient Status:  [name] - In-pt  Chief Complaint: Back pain, L1 fracture   Subjective: Patient known to IR service from thrombolytic therapy of bilateral PE in 2017. She is a 81 year old female with past medical history significant for as noted PE (on OP eliquis, now IV heparin), diabetes, acute on chronic kidney disease, hypertension, rheumatoid arthritis, breast and uterine cancer, hyperlipidemia, prior lower extremity DVT in 2017 and chronic back pain.  She has been wheelchair-bound for the last 3 years and currently living in an assisted living facility.  She recently tried to reach out to her therapist and felt a pop in her lower back and has been having pain since that time.  She was admitted to [facility] on 06/06/21 due to persistent back pain. MRI was obtained which revealed :  Acute inferior endplate fracture of L1 without height loss or retropulsion. Mild bone marrow edema. 3. Mild spinal canal stenosis at T10-11. 4. Chronic L4 compression deformity  Neurosurgery recommended TSLO brace with pain management ; request now received from [facility] for L1 vertebral body augmentation. She had some confusion upon admission but mental status has improved. She is afebrile, COVID 19 neg, WBC nl, creat 1.69. Pt denies fever, HA,CP,cough, abd pain, vomiting or bleeding. She cont to have back pain, mild dyspnea, some ankle/foot paresthesias; back pain is worse with any movement.   Past Medical History:  Diagnosis Date   Allergy    Arthritis    "joints; mostly on the right side" (01/11/2013)   Breast cancer ([place])    "left" (01/11/2013)   Cancer ([place])    endometrial   Chronic lower back pain    Cold    just started 0/96/28 cough   Complication of anesthesia    very phobic about needles.last surgery had to sedate before doing iv   Depression    Exertional shortness of breath    Hyperlipidemia    Hypertension    Osteoporosis     knees    Personal history of radiation therapy 03/2013   left   Status post radiation therapy within last four weeks 03/19/13-05/03/13   lt breast 60.4GY   Uterine cancer [facility])    Past Surgical History:  Procedure Laterality Date   ABDOMINAL HYSTERECTOMY  12/14/2012   BREAST BIOPSY Left 12/2012   BREAST LUMPECTOMY Left 2014   BREAST LUMPECTOMY WITH NEEDLE LOCALIZATION AND AXILLARY SENTINEL LYMPH NODE BX Left 01/11/2013   BREAST LUMPECTOMY WITH NEEDLE LOCALIZATION AND AXILLARY SENTINEL LYMPH NODE BX Left 01/11/2013   Procedure: BREAST LUMPECTOMY WITH NEEDLE LOCALIZATION AND AXILLARY SENTINEL LYMPH NODE BX;  Surgeon: Rolm Bookbinder, MD;  Location: [facility];  Service: General;  Laterality: Left;   [place]     "@ least one" (01/11/2013)   FOOT TENDON SURGERY Right    KNEE ARTHROSCOPY Right 1990's   "twice" (01/11/2013)   REPLACEMENT TOTAL KNEE Bilateral 2011-2012   TUBAL LIGATION         Allergies: Iodine  Medications: Prior to Admission medications   Medication Sig Start Date End Date Taking? Authorizing Provider  acetaminophen (TYLENOL) 500 MG tablet Take 1,000 mg by mouth 2 (two) times daily.   Yes [provider]  albuterol (VENTOLIN HFA) 108 (90 Base) MCG/ACT inhaler Inhale 2 puffs into the lungs every 4 (four) hours as needed (cough). 06/04/21  Yes [provider]  apixaban (ELIQUIS) 5 MG TABS tablet Take  5 mg by mouth 2 (two) times daily.   Yes [provider]  bisacodyl (DULCOLAX) 10 MG suppository Place 10 mg rectally daily as needed (constipation). 11/29/19  Yes [provider]  buPROPion (WELLBUTRIN SR) 150 MG 12 hr tablet Take 150 mg by mouth 3 (three) times daily. 11/11/15  Yes [provider]  CALMOSEPTINE 0.44-20.6 % OINT Apply 1 application topically 3 (three) times daily. Every shift 06/03/21  Yes [provider]  donepezil (ARICEPT) 5 MG tablet Take 5 mg by mouth every  morning. 11/21/19  Yes [provider]  Eszopiclone 3 MG TABS Take 3 mg by mouth at bedtime. 05/01/21  Yes [provider]  exemestane (AROMASIN) 25 MG tablet Take 1 tablet by mouth once daily Patient taking differently: 25 mg every morning. 06/05/19  Yes Magrinat, Virgie Dad, MD  fenofibrate 160 MG tablet Take 160 mg by mouth every evening. 10/02/15  Yes [provider]  gabapentin (NEURONTIN) 300 MG capsule TAKE 1 CAPSULE BY MOUTH AT BEDTIME Patient taking differently: Take 300 mg by mouth at bedtime. 02/15/18  Yes Magrinat, Virgie Dad, MD  glipiZIDE-metformin (METAGLIP) 2.5-500 MG tablet Take 1 tablet by mouth 2 (two) times daily. 03/13/21  Yes [provider]  HYDROcodone bit-homatropine (HYCODAN) 5-1.5 MG/5ML syrup 5 mLs every 12 (twelve) hours as needed for cough. 05/11/21  Yes [provider]  ibuprofen (ADVIL) 600 MG tablet Take 600 mg by mouth 3 (three) times daily as needed (pain). 05/01/21  Yes [provider]  LINZESS 72 MCG capsule Take 72 mcg by mouth every morning. 06/03/21  Yes [provider]  Menthol, Topical Analgesic, (BIOFREEZE ROLL-ON) 4 % GEL Apply 1 application topically 2 (two) times daily as needed (knee pain).   Yes [provider]  metoprolol succinate (TOPROL-XL) 50 MG 24 hr tablet Take 50 mg by mouth every morning. 12/19/12  Yes [provider]  Multiple Vitamin (MULTIVITAMIN WITH MINERALS) TABS tablet Take 1 tablet by mouth every morning.   Yes [provider]  Probiotic Product (RESTORA) CAPS Take 1 capsule by mouth at bedtime. 05/01/21  Yes [provider]  SENNA-PLUS 8.6-50 MG tablet Take 2 tablets by mouth every morning. 11/21/19  Yes [provider]  spironolactone (ALDACTONE) 50 MG tablet Take 50 mg by mouth every morning. 04/24/17  Yes [provider]  tiZANidine (ZANAFLEX) 4 MG tablet Take 4 mg by mouth every 8 (eight) hours as needed for muscle spasms. 06/03/21   Yes [provider]  traMADol (ULTRAM) 50 MG tablet Take 50 mg by mouth 2 (two) times daily as needed (pain). 05/04/21  Yes [provider]  benzonatate (TESSALON) 100 MG capsule Take 200 mg by mouth 3 (three) times daily. Patient not taking: Reported on 06/08/2021 05/16/21   [provider]  docusate sodium (COLACE) 100 MG capsule Take 1 capsule (100 mg total) by mouth daily. Patient not taking: Reported on 06/08/2021 05/15/19   Georgette Shell, MD  DULoxetine (CYMBALTA) 20 MG capsule Take 1 capsule (20 mg total) by mouth daily. Patient not taking: Reported on 06/08/2021 08/17/13   Gardenia Phlegm, NP  famotidine (PEPCID) 20 MG tablet Take 20 mg by mouth 2 (two) times daily. Patient not taking: Reported on 06/08/2021 05/28/21   [provider]  furosemide (LASIX) 20 MG tablet Take 20 mg by mouth 2 (two) times daily. Patient not taking: Reported on 06/08/2021 03/16/21   [provider]  metFORMIN (GLUCOPHAGE) 500 MG tablet Take 1  tablet (500 mg total) by mouth 2 (two) times daily with a meal. Patient not taking: Reported on 06/08/2021 12/09/15   [name], MD     Vital Signs: BP (!) 149/73 (BP Location: Right Arm)    Pulse 81    Temp 97.7 F (36.5 C) (Oral)    Resp 18    Ht 5\' 5"  (1.651 m)    Wt 300 lb (136.1 kg)    SpO2 97%    BMI 49.92 kg/m   Physical Exam awake/answers simple questions ok, TSLO brace in place; chest- sl dim BS bases; heart- RRR; abd- obese, soft,+BS,NT; no sig LE edema; moves all fours ok  Imaging: MR THORACIC SPINE WO CONTRAST  Result Date: 06/06/2021 CLINICAL DATA:  Severe back pain and lower extremity weakness EXAM: MRI THORACIC AND LUMBAR SPINE WITHOUT CONTRAST TECHNIQUE: Multiplanar and multiecho pulse sequences of the thoracic and lumbar spine were obtained without intravenous contrast. COMPARISON:  None. FINDINGS: Images are motion degraded and the study is truncated due to patient discomfort and inability  to tolerate the full length of the examination. MRI THORACIC SPINE FINDINGS Alignment:  Physiologic. Vertebrae: No fracture, evidence of discitis, or bone lesion. Cord: There is no spinal cord parenchymal signal abnormality. There are areas of low signal on the surface of the spinal cord at multiple locations, but this may be an artifact of motion. Paraspinal and other soft tissues: Unremarkable Disc levels: Degenerative disc disease is greatest at T10-11 where there is mild narrowing of the spinal canal. No other spinal canal stenosis. MRI LUMBAR SPINE FINDINGS Segmentation:  Standard. Alignment:  There is grade 1 anterolisthesis at L3-4. Vertebrae: There is a inferior endplate fracture of L1. No height loss or retropulsion. Mild bone marrow edema. The fracture likely extends to the anterior wall. There is a chronic L4 compression deformity. Conus medullaris and cauda equina: Poorly visualized due to motion Paraspinal and other soft tissues: Unremarkable Disc levels: No high-grade spinal canal stenosis. Limited assessment otherwise because of motion. IMPRESSION: 1. Motion degraded and truncated examination. 2. Acute inferior endplate fracture of L1 without height loss or retropulsion. Mild bone marrow edema. 3. Mild spinal canal stenosis at T10-11. 4. Chronic L4 compression deformity. Electronically Signed   By: Ulyses Jarred M.D.   On: 06/06/2021 23:15   MR LUMBAR SPINE WO CONTRAST  Result Date: 06/06/2021 CLINICAL DATA:  Severe back pain and lower extremity weakness EXAM: MRI THORACIC AND LUMBAR SPINE WITHOUT CONTRAST TECHNIQUE: Multiplanar and multiecho pulse sequences of the thoracic and lumbar spine were obtained without intravenous contrast. COMPARISON:  None. FINDINGS: Images are motion degraded and the study is truncated due to patient discomfort and inability to tolerate the full length of the examination. MRI THORACIC SPINE FINDINGS Alignment:  Physiologic. Vertebrae: No fracture, evidence of  discitis, or bone lesion. Cord: There is no spinal cord parenchymal signal abnormality. There are areas of low signal on the surface of the spinal cord at multiple locations, but this may be an artifact of motion. Paraspinal and other soft tissues: Unremarkable Disc levels: Degenerative disc disease is greatest at T10-11 where there is mild narrowing of the spinal canal. No other spinal canal stenosis. MRI LUMBAR SPINE FINDINGS Segmentation:  Standard. Alignment:  There is grade 1 anterolisthesis at L3-4. Vertebrae: There is a inferior endplate fracture of L1. No height loss or retropulsion. Mild bone marrow edema. The fracture likely extends to the anterior wall. There is a chronic L4 compression deformity. Conus medullaris and cauda equina:  Poorly visualized due to motion Paraspinal and other soft tissues: Unremarkable Disc levels: No high-grade spinal canal stenosis. Limited assessment otherwise because of motion. IMPRESSION: 1. Motion degraded and truncated examination. 2. Acute inferior endplate fracture of L1 without height loss or retropulsion. Mild bone marrow edema. 3. Mild spinal canal stenosis at T10-11. 4. Chronic L4 compression deformity. Electronically Signed   By: Ulyses Jarred M.D.   On: 06/06/2021 23:15   DG Chest Portable 1 View  Result Date: 06/06/2021 CLINICAL DATA:  Cough, hypoxia EXAM: PORTABLE CHEST 1 VIEW COMPARISON:  12/02/2015 FINDINGS: Heart size appears enlarged. Aortic atherosclerosis. Low lung volumes. Streaky right basilar opacity. Subtle interstitial opacities within the left lung base. No large pleural fluid collection. No pneumothorax. Degenerative changes of the shoulders. IMPRESSION: Low lung volumes with streaky right basilar opacity and subtle interstitial opacities within the left lung base. Findings may represent edema, infection, or atelectasis. Electronically Signed   By: Davina Poke D.O.   On: 06/06/2021 20:49    Labs:  CBC: Recent Labs    06/06/21 2002  06/07/21 0536 06/08/21 0518 06/09/21 0503  WBC 7.7 7.1 7.2 6.8  HGB 12.1 12.0 11.6* 11.0*  HCT 40.4 41.2 40.5 38.9  PLT 326 314 323 305    COAGS: Recent Labs    06/09/21 1100  APTT 31    BMP: Recent Labs    06/06/21 2002 06/07/21 0536 06/08/21 0518 06/09/21 0503  NA 137 136 137 137  K 5.5* 4.6 5.5* 4.6  CL 107 107 103 103  CO2 25 25 26 26   GLUCOSE 91 119* 146* 124*  BUN 31* 27* 27* 29*  CALCIUM 9.1 9.1 9.5 9.1  CREATININE 1.68* 1.59* 1.75* 1.69*  GFRNONAA 31* 33* 29* 31*    LIVER FUNCTION TESTS: Recent Labs    07/09/20 1307 06/06/21 2002  BILITOT 0.8 1.0  AST 17 28  ALT 17 16  ALKPHOS 46 46  PROT 6.6 7.5  ALBUMIN 3.3* 3.5    Assessment and Plan: Patient known to IR service from thrombolytic therapy of bilateral PE in 2017. She is a 81 year old female with past medical history significant for as noted PE (on OP eliquis, now IV heparin), diabetes, acute on chronic kidney disease, hypertension, rheumatoid arthritis, breast and uterine cancer, hyperlipidemia, prior lower extremity DVT in 2017 and chronic back pain.  She has been wheelchair-bound for the last 3 years and currently living in an assisted living facility.  She recently tried to reach out to her therapist and felt a pop in her lower back and has been having pain since that time.  She was admitted to [facility] on 06/06/21 due to persistent back pain. MRI was obtained which revealed :  Acute inferior endplate fracture of L1 without height loss or retropulsion. Mild bone marrow edema. 3. Mild spinal canal stenosis at T10-11. 4. Chronic L4 compression deformity  Neurosurgery recommended TSLO brace with pain management ; request now received from [facility] for L1 vertebral body augmentation. She had some confusion upon admission but mental status has improved. She is afebrile, COVID 19 neg, WBC nl, creat 1.69. Pt denies fever, HA,CP,cough, abd pain, vomiting or bleeding. She cont to have back pain, mild dyspnea, some  ankle/foot paresthesias; back pain is worse with any movement.Imaging studies have been reviewed by Dr. Serafina Royals and pt appears to be suitable candidate for procedure. Risks and benefits of kyphoplasty were discussed with the patient/son including, but not limited to education regarding the natural healing process of compression fractures  without intervention, bleeding, infection, cement migration which may cause spinal cord damage, paralysis, pulmonary embolism or even death.  This interventional procedure involves the use of X-rays and because of the nature of the planned procedure, it is possible that we will have prolonged use of X-ray fluoroscopy.  Potential radiation risks to you include (but are not limited to) the following: - A slightly elevated risk for cancer  several years later in life. This risk is typically less than 0.5% percent. This risk is low in comparison to the normal incidence of human cancer, which is 33% for women and 50% for men according to the [place]. - Radiation induced injury can include skin redness, resembling a rash, tissue breakdown / ulcers and hair loss (which can be temporary or permanent).   The likelihood of either of these occurring depends on the difficulty of the procedure and whether you are sensitive to radiation due to previous procedures, disease, or genetic conditions.   IF your procedure requires a prolonged use of radiation, you will be notified and given written instructions for further action.  It is your responsibility to monitor the irradiated area for the 2 weeks following the procedure and to notify your physician if you are concerned that you have suffered a radiation induced injury.    All of the patient's questions were answered, patient/son are agreeable to proceed.  Tent plans are for procedure on 1/20       Electronically Signed: D. Rowe Robert, PA-C 06/09/2021, 3:07 PM   I spent a total of 30 minutes at the the  patient's bedside AND on the patient's hospital floor or unit, greater than 50% of which was counseling/coordinating care for L1 vertebral body augmentation/kyphoplasty    Patient ID: KHAMIA STAMBAUGH, female   DOB: 12/29/1940, 81 y.o.   MRN: [id number]

## 2021-06-10 DIAGNOSIS — S32019A Unspecified fracture of first lumbar vertebra, initial encounter for closed fracture: Secondary | ICD-10-CM | POA: Diagnosis not present

## 2021-06-10 DIAGNOSIS — N1832 Chronic kidney disease, stage 3b: Secondary | ICD-10-CM | POA: Diagnosis not present

## 2021-06-10 DIAGNOSIS — M545 Low back pain, unspecified: Secondary | ICD-10-CM | POA: Diagnosis not present

## 2021-06-10 DIAGNOSIS — E1169 Type 2 diabetes mellitus with other specified complication: Secondary | ICD-10-CM | POA: Diagnosis not present

## 2021-06-10 LAB — BASIC METABOLIC PANEL
Anion gap: 7 (ref 5–15)
BUN: 18 mg/dL (ref 8–23)
CO2: 27 mmol/L (ref 22–32)
Calcium: 9.1 mg/dL (ref 8.9–10.3)
Chloride: 102 mmol/L (ref 98–111)
Creatinine, Ser: 1.34 mg/dL — ABNORMAL HIGH (ref 0.44–1.00)
GFR, Estimated: 41 mL/min — ABNORMAL LOW (ref >60)
Glucose, Bld: 154 mg/dL — ABNORMAL HIGH (ref 70–99)
Potassium: 4.6 mmol/L (ref 3.5–5.1)
Sodium: 136 mmol/L (ref 135–145)

## 2021-06-10 LAB — CBC
HCT: 38.8 % (ref 36.0–46.0)
Hemoglobin: 11.1 g/dL — ABNORMAL LOW (ref 12.0–15.0)
MCH: 27.2 pg (ref 26.0–34.0)
MCHC: 28.6 g/dL — ABNORMAL LOW (ref 30.0–36.0)
MCV: 95.1 fL (ref 80.0–100.0)
Platelets: 307 10*3/uL (ref 150–400)
RBC: 4.08 MIL/uL (ref 3.87–5.11)
RDW: 14.8 % (ref 11.5–15.5)
WBC: 6.1 10*3/uL (ref 4.0–10.5)
nRBC: 0 % (ref 0.0–0.2)

## 2021-06-10 LAB — GLUCOSE, CAPILLARY
Glucose-Capillary: 153 mg/dL — ABNORMAL HIGH (ref 70–99)
Glucose-Capillary: 164 mg/dL — ABNORMAL HIGH (ref 70–99)
Glucose-Capillary: 144 mg/dL — ABNORMAL HIGH (ref 70–99)
Glucose-Capillary: 161 mg/dL — ABNORMAL HIGH (ref 70–99)

## 2021-06-10 LAB — HEPARIN LEVEL (UNFRACTIONATED)
Heparin Unfractionated: 0.68 IU/mL (ref 0.30–0.70)
Heparin Unfractionated: 0.6 IU/mL (ref 0.30–0.70)

## 2021-06-10 LAB — MAGNESIUM: Magnesium: 2 mg/dL (ref 1.7–2.4)

## 2021-06-10 MED ORDER — ENSURE MAX PROTEIN PO LIQD
11.0000 [oz_av] | Freq: Two times a day (BID) | ORAL | Status: DC
  Administered 2021-06-11 – 2021-06-15 (×2): 11 [oz_av] via ORAL
  Filled 2021-06-10 (×19): qty 330

## 2021-06-10 MED ORDER — ADULT MULTIVITAMIN W/MINERALS CH
1.0000 | ORAL_TABLET | Freq: Every day | ORAL | Status: DC
  Administered 2021-06-10 – 2021-06-19 (×9): 1 via ORAL
  Filled 2021-06-10 (×9): qty 1

## 2021-06-10 MED ORDER — AZITHROMYCIN 250 MG PO TABS
500.0000 mg | ORAL_TABLET | Freq: Every day | ORAL | Status: DC
  Administered 2021-06-10: 500 mg via ORAL
  Filled 2021-06-10: qty 2

## 2021-06-10 MED ORDER — POLYETHYLENE GLYCOL 3350 17 G PO PACK
17.0000 g | PACK | Freq: Every day | ORAL | Status: DC
  Administered 2021-06-10 – 2021-06-19 (×4): 17 g via ORAL
  Filled 2021-06-10 (×6): qty 1

## 2021-06-10 MED ORDER — ASCORBIC ACID 500 MG PO TABS
250.0000 mg | ORAL_TABLET | Freq: Two times a day (BID) | ORAL | Status: DC
  Administered 2021-06-10 – 2021-06-19 (×18): 250 mg via ORAL
  Filled 2021-06-10 (×18): qty 1

## 2021-06-10 MED ORDER — ZINC SULFATE 220 (50 ZN) MG PO CAPS
220.0000 mg | ORAL_CAPSULE | Freq: Every day | ORAL | Status: DC
  Administered 2021-06-10 – 2021-06-19 (×9): 220 mg via ORAL
  Filled 2021-06-10 (×10): qty 1

## 2021-06-10 NOTE — Plan of Care (Signed)
°  Problem: Clinical Measurements: Goal: Diagnostic test results will improve Outcome: Progressing   Problem: Elimination: Goal: Will not experience complications related to bowel motility Outcome: Progressing   Problem: Pain Managment: Goal: General experience of comfort will improve Outcome: Progressing

## 2021-06-10 NOTE — Progress Notes (Signed)
[place] for Eliquis >> heparin drip Indication: history of saddle PE  Allergies  Allergen Reactions   Iodine Rash    Patient Measurements: Height: 5\' 5"  (165.1 cm) Weight: 136.1 kg (300 lb) IBW/kg (Calculated) : 57 Heparin Dosing Weight: 90.7 kg  Vital Signs: Temp: 98 F (36.7 C) (01/18 1334) Temp Source: Oral (01/18 0649) BP: 136/61 (01/18 1334) Pulse Rate: 76 (01/18 1334)  Labs: Recent Labs    06/08/21 0518 06/09/21 0503 06/09/21 1100 06/09/21 1100 06/09/21 1958 06/10/21 0502 06/10/21 1315  HGB 11.6* 11.0*  --   --   --  11.1*  --   HCT 40.5 38.9  --   --   --  38.8  --   PLT 323 305  --   --   --  307  --   APTT  --   --  31  --   --   --   --   HEPARINUNFRC  --   --  0.49   < > 0.78* 0.68 0.60  CREATININE 1.75* 1.69*  --   --   --  1.34*  --    < > = values in this interval not displayed.     Estimated Creatinine Clearance: 48.4 mL/min (A) (by C-G formula based on SCr of 1.34 mg/dL (H)).   Medical History: Past Medical History:  Diagnosis Date   Allergy    Arthritis    "joints; mostly on the right side" (01/11/2013)   Breast cancer ([place])    "left" (01/11/2013)   Cancer ([place])    endometrial   Chronic lower back pain    Cold    just started 4/40/10 cough   Complication of anesthesia    very phobic about needles.last surgery had to sedate before doing iv   Depression    Exertional shortness of breath    Hyperlipidemia    Hypertension    Osteoporosis    knees    Personal history of radiation therapy 03/2013   left   Status post radiation therapy within last four weeks 03/19/13-05/03/13   lt breast 60.4GY   Uterine cancer ([facility])     Medications:  Scheduled:   acidophilus  1 capsule Oral Daily   vitamin C  250 mg Oral BID   azithromycin  500 mg Oral Daily   benzonatate  200 mg Oral TID   bisacodyl  10 mg Rectal Daily   buPROPion  150 mg Oral BID   docusate sodium  100 mg Oral Daily   donepezil  5 mg  Oral Daily   DULoxetine  20 mg Oral Daily   exemestane  25 mg Oral Daily   famotidine  20 mg Oral BID   gabapentin  200 mg Oral BID   insulin aspart  0-15 Units Subcutaneous TID WC & HS   linaclotide  72 mcg Oral q morning   metoprolol succinate  50 mg Oral BID   multivitamin with minerals  1 tablet Oral Daily   polyethylene glycol  17 g Oral Daily   Ensure Max Protein  11 oz Oral BID   senna-docusate  1 tablet Oral BID   zinc sulfate  220 mg Oral Daily    Assessment: 81 year old female with notable PMH of saddle PE (2017) on Eliquis 5mg  PO BID presents with back pain after working with therapy.  MRI in the ED obtained which showed L1 inferior endplate fracture with no cord involvement.  NSGY  advised for TLSO brace to be placed and no surgical intervention needed.  TRH discussing with IR for the possibility of kyphoplasty.  Eliquis has been held - last dose 1/16 @ 1059.  Pharmacy has been consulted to dose heparin drip while Eliquis is on hold.  Today, 06/10/21 - Hgb stable 11.1, plts wnl - SCr decreased to 1.34 - Heparin Level 0.60 (therapeutic) with heparin gtt @ 1300 units/hr - no complications of therapy noted per discussion with RN  Goal of Therapy:  Heparin level 0.3-0.7 units/ml aPTT 66-102 seconds Monitor platelets by anticoagulation protocol: Yes   Plan:  Continue heparin drip at 1300 units/hour Monitor HL and CBC daily Follow-up plans for kyphoplasty and ability to transition back to [place], PharmD 06/10/2021 2:04 PM

## 2021-06-10 NOTE — Progress Notes (Signed)
Received call from pharmacy, UFH = 0.60, therapeutic, no change in Heparin drip, currently infusing at 1300 Units/hr.  Next UFH level 06/11/21 at 0500H.

## 2021-06-10 NOTE — Progress Notes (Signed)
PROGRESS NOTE  [name] KCL:[id number] DOB: Dec 09, 1940 DOA: 06/06/2021 PCP: Willey Blade, MD   LOS: 3 days   Brief narrative:  [name] is a 81 y.o. female with medical history of diabetes type 2, hypertension, rheumatoid arthritis, breast and uterine cancer, chronic back pain, hyperlipidemia presented to the hospital with low back pain.  Patient is pretty much wheelchair-bound for the last 3 years and is currently at assisted living facility.  Patient was trying to reach out to the therapist when she felt a pop in her lower back and has been having pain since then.  MRI was obtained in the ED which showed the L1 inferior endplate fracture.  ED provider spoke with neurosurgery Dr. Zada Finders who recommended TSL0 versus and no surgical intervention.  In the ED, patient was also noted to have hyperkalemia and was on spironolactone as outpatient with a stage IIIb. At this time, patient is being managed with pain medication, TSLO brace but continues to have severe pain and had a lot of confusion and disorientation with narcotics.  IR has been consulted for possible kyphoplasty.   Assessment/Plan:  Principal Problem:   Closed L1 vertebral fracture (HCC) Active Problems:   Essential hypertension   Diabetes mellitus type 2 in obese (HCC)   CKD (chronic kidney disease), stage III (HCC)   Lumbago   Hyperkalemia   Pressure injury of skin  Closed L1 vertebral fracture with continued back pain.   Neurosurgery recommended TSLO brace with pain management but patient developed delirium and confusion with narcotics..  Currently plan is kyphoplasty likely on 06/12/2021.  Continue to hold Eliquis.  Have spoken with the patient's son about it.  Low-grade fever.  COVID and influenza was negative.  Lactate was within normal limits.  No leukocytosis.   Urinalysis does not show any obvious signs of infection.  Chest x-ray showed some low lung volumes with basilar opacity and interstitial  opacities in the left lung.  No further fevers.  On Rocephin and Zithromax for possible pneumonia.  Confusion, delirious state on 06/08/2021 likely multifactorial from hospital induced delirium, febrile episode, polypharmacy.  X-ray of the chest with some basilar opacities.  IV Dilaudid was discontinued.  Has improved at this time.  On empiric antibiotic.  Continue Percocet for now.   Continue lumbar brace.  Hyperkalemia. Improved after Lokelma, latest potassium of 4.6.  Diabetes mellitus type 2.  Continue sliding scale insulin Accu-Cheks.  POC glucose of 164.   CKD stage IIIb .  AKI ruled out. We will continue to monitor renal function closely.  Latest creatinine of 1.3.  Off IV fluids at this time.  Essential hypertension.  Continue metoprolol.  Spironolactone on hold due to hyperkalemia.  Last blood pressure of 146/70   Right buttocks stage II pressure injury present on admission.  Continue wound care.  History of VTE/saddle pulmonary embolism.  Eliquis on hold since 06/08/2021.   on heparin drip   DVT prophylaxis:  Eliquis on hold.  We will continue with heparin drip.   Code Status: Full code  Family Communication:  Spoke with the patient's son on the phone on 06/09/2021  Status is: Inpatient  Remains inpatient appropriate because:  Ambulatory dysfunction, intractable back pain, possible need for rehabilitation placement, PT evaluation, for kyphoplasty on 06/12/2021.  Consultants: Neurosurgery-  Verbal IR for kyphoplasty  Procedures: TSLO brace application  Anti-infectives:  Rocephin and Zithromax 1/16>  Anti-infectives (From admission, onward)    Start     Dose/Rate Route Frequency Ordered  Stop   06/10/21 1600  azithromycin (ZITHROMAX) tablet 500 mg        500 mg Oral Daily 06/10/21 0711     06/08/21 1600  cefTRIAXone (ROCEPHIN) 2 g in sodium chloride 0.9 % 100 mL IVPB        2 g 200 mL/hr over 30 Minutes Intravenous Every 24 hours 06/08/21 1459     06/08/21 1600   azithromycin (ZITHROMAX) 500 mg in sodium chloride 0.9 % 250 mL IVPB  Status:  Discontinued        500 mg 250 mL/hr over 60 Minutes Intravenous Every 24 hours 06/08/21 1459 06/10/21 0711      Subjective: Today, patient was seen and examined at bedside.  Complains of mild back pain.  Has been resumed off on the TSLO brace.  Objective: Vitals:   06/10/21 0756 06/10/21 0934  BP:  (!) 146/70  Pulse: 83 84  Resp:    Temp:    SpO2: 96% 95%    Intake/Output Summary (Last 24 hours) at 06/10/2021 1220 Last data filed at 06/10/2021 0900 Gross per 24 hour  Intake 1196.64 ml  Output 100 ml  Net 1096.64 ml    Filed Weights   06/06/21 1852  Weight: 136.1 kg   Body mass index is 49.92 kg/m.   Physical Exam:  General: Morbidly obese built, not in obvious distress HENT:   No scleral pallor or icterus noted. Oral mucosa is moist.  Chest:  Clear breath sounds.  Diminished breath sounds bilaterally. No crackles or wheezes.  CVS: S1 &S2 heard. No murmur.  Regular rate and rhythm.  Loosen the TSL brace. Abdomen: Soft, nontender, nondistended.  Bowel sounds are heard.   Extremities: No cyanosis, clubbing or edema.  Peripheral pulses are palpable. Psych: Alert, awake and oriented to place and person, CNS:  No cranial nerve deficits.  Power equal in all extremities.   Skin: Warm and dry.  No rashes noted.  Data Review: I have personally reviewed the following laboratory data and studies,  CBC: Recent Labs  Lab 06/06/21 2002 06/07/21 0536 06/08/21 0518 06/09/21 0503 06/10/21 0502  WBC 7.7 7.1 7.2 6.8 6.1  NEUTROABS 4.9  --   --   --   --   HGB 12.1 12.0 11.6* 11.0* 11.1*  HCT 40.4 41.2 40.5 38.9 38.8  MCV 90.8 92.6 94.4 94.4 95.1  PLT 326 314 323 305 213    Basic Metabolic Panel: Recent Labs  Lab 06/06/21 2002 06/07/21 0536 06/08/21 0518 06/09/21 0503 06/10/21 0502  NA 137 136 137 137 136  K 5.5* 4.6 5.5* 4.6 4.6  CL 107 107 103 103 102  CO2 25 25 26 26 27   GLUCOSE 91  119* 146* 124* 154*  BUN 31* 27* 27* 29* 18  CREATININE 1.68* 1.59* 1.75* 1.69* 1.34*  CALCIUM 9.1 9.1 9.5 9.1 9.1  MG  --   --  2.0 2.0 2.0    Liver Function Tests: Recent Labs  Lab 06/06/21 2002  AST 28  ALT 16  ALKPHOS 46  BILITOT 1.0  PROT 7.5  ALBUMIN 3.5    No results for input(s): LIPASE, AMYLASE in the last 168 hours. No results for input(s): AMMONIA in the last 168 hours. Cardiac Enzymes: No results for input(s): CKTOTAL, CKMB, CKMBINDEX, TROPONINI in the last 168 hours. BNP (last 3 results) No results for input(s): BNP in the last 8760 hours.  ProBNP (last 3 results) No results for input(s): PROBNP in the last 8760 hours.  CBG:  Recent Labs  Lab 06/09/21 1212 06/09/21 1642 06/09/21 2210 06/10/21 0723 06/10/21 1140  GLUCAP 144* 145* 149* 153* 164*    Recent Results (from the past 240 hour(s))  Resp Panel by RT-PCR (Flu A&B, Covid) Nasopharyngeal Swab     Status: None   Collection Time: 06/06/21  8:02 PM   Specimen: Nasopharyngeal Swab; Nasopharyngeal(NP) swabs in vial transport medium  Result Value Ref Range Status   SARS Coronavirus 2 by RT PCR NEGATIVE NEGATIVE Final    Comment: (NOTE) SARS-CoV-2 target nucleic acids are NOT DETECTED.  The SARS-CoV-2 RNA is generally detectable in upper respiratory specimens during the acute phase of infection. The lowest concentration of SARS-CoV-2 viral copies this assay can detect is 138 copies/mL. A negative result does not preclude SARS-Cov-2 infection and should not be used as the sole basis for treatment or other patient management decisions. A negative result may occur with  improper specimen collection/handling, submission of specimen other than nasopharyngeal swab, presence of viral mutation(s) within the areas targeted by this assay, and inadequate number of viral copies(<138 copies/mL). A negative result must be combined with clinical observations, patient history, and epidemiological information. The  expected result is Negative.  Fact Sheet for Patients:  EntrepreneurPulse.com.au  Fact Sheet for Healthcare Providers:  IncredibleEmployment.be  This test is no t yet approved or cleared by the [place] FDA and  has been authorized for detection and/or diagnosis of SARS-CoV-2 by FDA under an Emergency Use Authorization (EUA). This EUA will remain  in effect (meaning this test can be used) for the duration of the COVID-19 declaration under Section 564(b)(1) of the Act, 21 U.S.C.section 360bbb-3(b)(1), unless the authorization is terminated  or revoked sooner.       Influenza A by PCR NEGATIVE NEGATIVE Final   Influenza B by PCR NEGATIVE NEGATIVE Final    Comment: (NOTE) The Xpert Xpress SARS-CoV-2/FLU/RSV plus assay is intended as an aid in the diagnosis of influenza from Nasopharyngeal swab specimens and should not be used as a sole basis for treatment. Nasal washings and aspirates are unacceptable for Xpert Xpress SARS-CoV-2/FLU/RSV testing.  Fact Sheet for Patients: EntrepreneurPulse.com.au  Fact Sheet for Healthcare Providers: IncredibleEmployment.be  This test is not yet approved or cleared by the [place] FDA and has been authorized for detection and/or diagnosis of SARS-CoV-2 by FDA under an Emergency Use Authorization (EUA). This EUA will remain in effect (meaning this test can be used) for the duration of the COVID-19 declaration under Section 564(b)(1) of the Act, 21 U.S.C. section 360bbb-3(b)(1), unless the authorization is terminated or revoked.  Performed at [facility], [place] [address], [place], [place] [address]       Studies: No results found.    Flora Lipps, MD  [facility] 06/10/2021  If 7PM-7AM, please contact night-coverage

## 2021-06-10 NOTE — Progress Notes (Signed)
PHARMACIST - PHYSICIAN COMMUNICATION DR:   [name] CONCERNING: Antibiotic IV to Oral Route Change Policy  RECOMMENDATION: This patient is receiving azithromycin by the intravenous route.  Based on criteria approved by the Pharmacy and Therapeutics Committee, the antibiotic(s) is/are being converted to the equivalent oral dose form(s).   DESCRIPTION: These criteria include: Patient being treated for a respiratory tract infection, urinary tract infection, cellulitis or clostridium difficile associated diarrhea if on metronidazole The patient is not neutropenic and does not exhibit a GI malabsorption state The patient is eating (either orally or via tube) and/or has been taking other orally administered medications for a least 24 hours The patient is improving clinically and has a Tmax < 100.5  If you have questions about this conversion, please contact the Pharmacy Department  []   [phone] )  [name] []   [phone] )  [name]  []   [phone] )  [name] [x]   [phone] )  [name], PharmD 06/10/2021 7:11 AM

## 2021-06-10 NOTE — Progress Notes (Signed)
[place] for Eliquis >> heparin drip Indication: history of saddle PE  Allergies  Allergen Reactions   Iodine Rash    Patient Measurements: Height: 5\' 5"  (165.1 cm) Weight: 136.1 kg (300 lb) IBW/kg (Calculated) : 57 Heparin Dosing Weight: 90.7 kg  Vital Signs: Temp: 98 F (36.7 C) (01/17 2137) Temp Source: Oral (01/17 2137) BP: 142/64 (01/17 2137) Pulse Rate: 85 (01/17 2137)  Labs: Recent Labs    06/08/21 0518 06/09/21 0503 06/09/21 1100 06/09/21 1958 06/10/21 0502  HGB 11.6* 11.0*  --   --  11.1*  HCT 40.5 38.9  --   --  38.8  PLT 323 305  --   --  307  APTT  --   --  31  --   --   HEPARINUNFRC  --   --  0.49 0.78* 0.68  CREATININE 1.75* 1.69*  --   --  1.34*     Estimated Creatinine Clearance: 48.4 mL/min (A) (by C-G formula based on SCr of 1.34 mg/dL (H)).   Medical History: Past Medical History:  Diagnosis Date   Allergy    Arthritis    "joints; mostly on the right side" (01/11/2013)   Breast cancer ([place])    "left" (01/11/2013)   Cancer ([place])    endometrial   Chronic lower back pain    Cold    just started 9/56/38 cough   Complication of anesthesia    very phobic about needles.last surgery had to sedate before doing iv   Depression    Exertional shortness of breath    Hyperlipidemia    Hypertension    Osteoporosis    knees    Personal history of radiation therapy 03/2013   left   Status post radiation therapy within last four weeks 03/19/13-05/03/13   lt breast 60.4GY   Uterine cancer (HCC)     Medications:  Scheduled:   acidophilus  1 capsule Oral Daily   benzonatate  200 mg Oral TID   bisacodyl  10 mg Rectal Daily   buPROPion  150 mg Oral BID   docusate sodium  100 mg Oral Daily   donepezil  5 mg Oral Daily   DULoxetine  20 mg Oral Daily   exemestane  25 mg Oral Daily   famotidine  20 mg Oral BID   gabapentin  200 mg Oral BID   insulin aspart  0-15 Units Subcutaneous TID WC & HS    linaclotide  72 mcg Oral q morning   metoprolol succinate  50 mg Oral BID   senna-docusate  1 tablet Oral BID    Assessment: 81 year old female with notable PMH of saddle PE (2017) on Eliquis 5mg  PO BID presents with back pain after working with therapy.  MRI in the ED obtained which showed L1 inferior endplate fracture with no cord involvement.  NSGY advised for TLSO brace to be placed and no surgical intervention needed.  TRH discussing with IR for the possibility of kyphoplasty.  Eliquis has been held - last dose 1/16 @ 1059.  Pharmacy has been consulted to dose heparin drip while Eliquis is on hold.  Today, 06/10/21 - Hgb stable 11.1, plts wnl - SCr decreased to 1.34 - Heparin Level 0.68 (therapeutic) with heparin gtt @ 1300 units/hr - no complications of therapy noted  Goal of Therapy:  Heparin level 0.3-0.7 units/ml aPTT 66-102 seconds Monitor platelets by anticoagulation protocol: Yes   Plan:  Continue heparin drip at 1300  units/hour Recheck heparin level in 8 hour to confirm therapeutic dose Follow-up plans for kyphoplasty and ability to transition back to [place], PharmD 06/10/2021 5:42 AM

## 2021-06-10 NOTE — Progress Notes (Signed)
Initial Nutrition Assessment  DOCUMENTATION CODES:   Morbid obesity  INTERVENTION:   -Ensure MAX Protein po BID, each supplement provides 150 kcal and 30 grams of protein   -Multivitamin with minerals daily  -250 mg Vitamin C BID -220 mg zinc sulfate daily  NUTRITION DIAGNOSIS:   Increased nutrient needs related to wound healing as evidenced by estimated needs.  GOAL:   Patient will meet greater than or equal to 90% of their needs  MONITOR:   Supplement acceptance, Weight trends, Labs, PO intake, I & O's, Skin  REASON FOR ASSESSMENT:   Other (Comment) (Wounds)    ASSESSMENT:   81 y.o. female with medical history of diabetes type 2, hypertension, rheumatoid arthritis, breast and uterine cancer, chronic back pain, hyperlipidemia presented to hospital with low back pain.  Patient is pretty much wheelchair-bound for the last 3 years and is currently at assisted living facility.  Patient laying in bed, winces in pain occasionally. Pt reports she does not eat consistently given she doesn't like the scheduling and some of the food at her facility. Pt is wheelchair bound. Per chart review, pt is scheduled for kyphoplasty 1/20.  Pt is consuming 25-100% of meals currently. Will order Ensure Max supplements for additional protein and vitamins to aid in wound healing.  Per weight records, pt has gained weight since 2021. This is likely not helped by wheelchair bound status.  Medications: Colace, Pepcid, Miralax, senokot  Labs reviewed:  CBGs: 149-164  NUTRITION - FOCUSED PHYSICAL EXAM:  No muscle or fat depletions.  Flowsheet Row Most Recent Value  Edema (RD Assessment) Mild  Hair Reviewed  Eyes Reviewed  Mouth Reviewed  [poor dentition]  Skin Reviewed  Nails Reviewed       Diet Order:   Diet Order             Diet heart healthy/carb modified Room service appropriate? Yes; Fluid consistency: Thin  Diet effective now                   EDUCATION NEEDS:    Not appropriate for education at this time  Skin:  Skin Assessment: Skin Integrity Issues: Skin Integrity Issues:: Stage II Stage II: rt buttocks  Last BM:  1/17  Height:   Ht Readings from Last 1 Encounters:  06/06/21 5\' 5"  (1.651 m)    Weight:   Wt Readings from Last 1 Encounters:  06/06/21 136.1 kg    BMI:  Body mass index is 49.92 kg/m.  Estimated Nutritional Needs:   Kcal:  1950-2150  Protein:  85-95g  Fluid:  2L/day  Clayton Bibles, MS, RD, LDN Inpatient Clinical Dietitian Contact information available via Amion

## 2021-06-10 NOTE — TOC Initial Note (Signed)
Transition of Care [facility]) - Initial/Assessment Note    Patient Details  Name: [name] MRN: [id number] Date of Birth: Sep 16, 1940  Transition of Care [facility]) CM/SW Contact:    Sherry Catalan, RN Phone Number: 06/10/2021, 11:19 AM  Clinical Narrative:                 Pt is from [facility]. She is wheelchair bound at baseline. Pt to return to [facility] at [facility].Planned Kyphoplasty on 1/20.  TOC will continue to follow.  Expected Discharge Plan: Assisted Living Barriers to Discharge: Continued Medical Work up   Expected Discharge Plan and Services Expected Discharge Plan: Assisted Living   Discharge Planning Services: CM Consult   Living arrangements for the past 2 months: Assisted Living Facility                     Prior Living Arrangements/Services Living arrangements for the past 2 months: [name] Lives with:: Facility Resident Patient language and need for interpreter reviewed:: Yes        Need for Family Participation in Patient Care: Yes (Comment) Care giver support system in place?: Yes (comment)   Criminal Activity/Legal Involvement Pertinent to Current Situation/Hospitalization: No - Comment as needed  Activities of Daily Living Home Assistive Devices/Equipment: Transport planner, Environmental consultant (specify type), Cane (specify quad or straight), Wheelchair ADL Screening (condition at time of admission) Patient's cognitive ability adequate to safely complete daily activities?: Yes Is the patient deaf or have difficulty hearing?: No Does the patient have difficulty seeing, even when wearing glasses/contacts?: No Does the patient have difficulty concentrating, remembering, or making decisions?: No Patient able to express need for assistance with ADLs?: Yes Does the patient have difficulty dressing or bathing?: Yes Independently performs ADLs?: No Communication: Independent Dressing (OT): Needs assistance Is this a change from baseline?:  Pre-admission baseline Grooming: Needs assistance Is this a change from baseline?: Pre-admission baseline Feeding: Independent Bathing: Needs assistance Is this a change from baseline?: Pre-admission baseline Toileting: Needs assistance Is this a change from baseline?: Pre-admission baseline In/Out Bed: Needs assistance Is this a change from baseline?: Pre-admission baseline Walks in Home: Needs assistance Is this a change from baseline?: Pre-admission baseline Does the patient have difficulty walking or climbing stairs?: Yes Weakness of Legs: Both Weakness of Arms/Hands: Both  Permission Sought/Granted                  Emotional Assessment       Orientation: : Oriented to Self, Oriented to Place, Oriented to  Time, Oriented to Situation Alcohol / Substance Use: Not Applicable Psych Involvement: No (comment)  Admission diagnosis:  Closed L1 vertebral fracture (HCC) [S32.019A] Weakness of lower extremity, unspecified laterality [R29.898] Closed fracture of first lumbar vertebra, unspecified fracture morphology, initial encounter ([place]) [S32.019A] Acute midline low back pain, unspecified whether sciatica present [M54.50] Patient Active Problem List   Diagnosis Date Noted   Closed L1 vertebral fracture ([place]) 06/07/2021   Lumbago 06/07/2021   Hyperkalemia 06/07/2021   Pressure injury of skin 06/07/2021   Fecal impaction (HCC)    UTI (urinary tract infection) 05/11/2019   Constipation    Acute cystitis 05/10/2019   CKD (chronic kidney disease), stage III ([place]) 05/10/2019   Weakness 05/09/2019   Pulmonary embolus ([place])    Pulmonary embolus, left (HCC)    Pulmonary embolus, right (HCC)    CKD (chronic kidney disease), stage I    Bilateral pulmonary embolism ([place])    PE (  pulmonary embolism)    Acute kidney injury superimposed on chronic kidney disease ([place]) 11/30/2015   Depression 11/30/2015   Essential hypertension 11/30/2015   Acute respiratory failure with hypoxia  ([place]) 11/30/2015   Diabetes mellitus type 2 in obese ([place]) 11/30/2015   Pulmonary embolism ([place]) 11/29/2015   Pulmonary embolism, bilateral ([place]) 11/29/2015   Pain in lower limb 01/10/2015   Malignant neoplasm of upper-outer quadrant of left breast in female, estrogen receptor positive ([place]) 01/24/2013   Edema 01/09/2013   Onychomycosis 01/09/2013   Pain in joint, ankle and foot 01/09/2013   PCP:  Sherry Blade, MD Pharmacy:   [facility] ([facility]), [place] [address] [place] [address] Phone: [phone] Fax: [phone]  [facility] [place], [place] [address] [place] [address] Phone: [phone] Fax: [phone]     Social Determinants of Health ([place]) Interventions    Readmission Risk Interventions Readmission Risk Prevention Plan 06/10/2021  Transportation Screening Complete  PCP or Specialist Appt within 5-7 Days Complete  Home Care Screening Complete  Medication Review (RN CM) Complete  Some recent data might be hidden

## 2021-06-11 DIAGNOSIS — S32019A Unspecified fracture of first lumbar vertebra, initial encounter for closed fracture: Secondary | ICD-10-CM | POA: Diagnosis not present

## 2021-06-11 DIAGNOSIS — M545 Low back pain, unspecified: Secondary | ICD-10-CM | POA: Diagnosis not present

## 2021-06-11 DIAGNOSIS — N1832 Chronic kidney disease, stage 3b: Secondary | ICD-10-CM | POA: Diagnosis not present

## 2021-06-11 DIAGNOSIS — E1169 Type 2 diabetes mellitus with other specified complication: Secondary | ICD-10-CM | POA: Diagnosis not present

## 2021-06-11 LAB — CBC
HCT: 40.6 % (ref 36.0–46.0)
Hemoglobin: 11.9 g/dL — ABNORMAL LOW (ref 12.0–15.0)
MCH: 27.4 pg (ref 26.0–34.0)
MCHC: 29.3 g/dL — ABNORMAL LOW (ref 30.0–36.0)
MCV: 93.5 fL (ref 80.0–100.0)
Platelets: 315 10*3/uL (ref 150–400)
RBC: 4.34 MIL/uL (ref 3.87–5.11)
RDW: 14.5 % (ref 11.5–15.5)
WBC: 6.9 10*3/uL (ref 4.0–10.5)
nRBC: 0 % (ref 0.0–0.2)

## 2021-06-11 LAB — HEPARIN LEVEL (UNFRACTIONATED): Heparin Unfractionated: 0.65 IU/mL (ref 0.30–0.70)

## 2021-06-11 LAB — GLUCOSE, CAPILLARY
Glucose-Capillary: 131 mg/dL — ABNORMAL HIGH (ref 70–99)
Glucose-Capillary: 136 mg/dL — ABNORMAL HIGH (ref 70–99)
Glucose-Capillary: 134 mg/dL — ABNORMAL HIGH (ref 70–99)
Glucose-Capillary: 144 mg/dL — ABNORMAL HIGH (ref 70–99)

## 2021-06-11 MED ORDER — MUPIROCIN 2 % EX OINT: 1.0000 "application " | TOPICAL_OINTMENT | Freq: Two times a day (BID) | CUTANEOUS | Status: DC

## 2021-06-11 NOTE — Care Management Important Message (Signed)
Important Message  Patient Details IM Letter given to the Patient. Name: [name] MRN: [id number] Date of Birth: April 24, 1941   Medicare Important Message Given:  Yes     [name] 06/11/2021, 10:12 AM

## 2021-06-11 NOTE — Progress Notes (Signed)
Per report from night RN, patient asked to have the TLSO brace taken off.  Night RN explained that it has to be kept in place but patient took it off.  According to report, patient was uncomfortable and causing worsening pain on lower back.

## 2021-06-11 NOTE — Progress Notes (Signed)
[facility] for Eliquis >> heparin drip Indication: history of saddle PE  Allergies  Allergen Reactions   Iodine Rash    Patient Measurements: Height: 5\' 5"  (165.1 cm) Weight: 136.1 kg (300 lb) IBW/kg (Calculated) : 57 Heparin Dosing Weight: 90.7 kg  Vital Signs: Temp: 98.9 F (37.2 C) (01/19 0457) Temp Source: Oral (01/18 2155) BP: 138/63 (01/19 0457) Pulse Rate: 80 (01/19 0457)  Labs: Recent Labs    06/09/21 0503 06/09/21 1100 06/09/21 1958 06/10/21 0502 06/10/21 1315 06/11/21 0444  HGB 11.0*  --   --  11.1*  --  11.9*  HCT 38.9  --   --  38.8  --  40.6  PLT 305  --   --  307  --  315  APTT  --  31  --   --   --   --   HEPARINUNFRC  --  0.49   < > 0.68 0.60 0.65  CREATININE 1.69*  --   --  1.34*  --   --    < > = values in this interval not displayed.     Estimated Creatinine Clearance: 48.4 mL/min (A) (by C-G formula based on SCr of 1.34 mg/dL (H)).   Medical History: Past Medical History:  Diagnosis Date   Allergy    Arthritis    "joints; mostly on the right side" (01/11/2013)   Breast cancer ([place])    "left" (01/11/2013)   Cancer ([place])    endometrial   Chronic lower back pain    Cold    just started 3/78/58 cough   Complication of anesthesia    very phobic about needles.last surgery had to sedate before doing iv   Depression    Exertional shortness of breath    Hyperlipidemia    Hypertension    Osteoporosis    knees    Personal history of radiation therapy 03/2013   left   Status post radiation therapy within last four weeks 03/19/13-05/03/13   lt breast 60.4GY   Uterine cancer (HCC)     Medications:  Scheduled:   acidophilus  1 capsule Oral Daily   vitamin C  250 mg Oral BID   azithromycin  500 mg Oral Daily   benzonatate  200 mg Oral TID   bisacodyl  10 mg Rectal Daily   buPROPion  150 mg Oral BID   docusate sodium  100 mg Oral Daily   donepezil  5 mg Oral Daily   DULoxetine  20 mg Oral Daily    exemestane  25 mg Oral Daily   famotidine  20 mg Oral BID   gabapentin  200 mg Oral BID   insulin aspart  0-15 Units Subcutaneous TID WC & HS   linaclotide  72 mcg Oral q morning   metoprolol succinate  50 mg Oral BID   multivitamin with minerals  1 tablet Oral Daily   polyethylene glycol  17 g Oral Daily   Ensure Max Protein  11 oz Oral BID   senna-docusate  1 tablet Oral BID   zinc sulfate  220 mg Oral Daily    Assessment: 81 year old female with notable PMH of saddle PE (2017) on Eliquis 5mg  PO BID presents with back pain after working with therapy.  MRI in the ED obtained which showed L1 inferior endplate fracture with no cord involvement.  NSGY advised for TLSO brace to be placed and no surgical intervention needed.  TRH discussing with IR for  the possibility of kyphoplasty.  Eliquis has been held - last dose 1/16 @ 1059.  Pharmacy has been consulted to dose heparin drip while Eliquis is on hold.  Today, 06/11/21 - Hgb stable, plts wnl - Heparin Level 0.65 (therapeutic) with heparin gtt @ 1300 units/hr - no complications of therapy noted per RN report  Goal of Therapy:  Monitor platelets by anticoagulation protocol: Yes   Plan:  Continue heparin drip at 1300 units/hour Monitor HL and CBC daily Follow-up plans for kyphoplasty and ability to transition back to [place], PharmD, [place] [phone] 06/11/2021  7:55 AM

## 2021-06-11 NOTE — Progress Notes (Signed)
Attempted to re-adjust the TLSO brace in bed while patient on side, patient unable to tolerate due to pain.  Patient refused to take pain medications when offered.

## 2021-06-11 NOTE — Plan of Care (Signed)
  Problem: Clinical Measurements: Goal: Diagnostic test results will improve Outcome: Progressing   Problem: Clinical Measurements: Goal: Respiratory complications will improve Outcome: Progressing   

## 2021-06-11 NOTE — Progress Notes (Signed)
Called Ortho technician for assistance on TLSO brace.  Per Ortho technician, "we do not do that, we need to call the vendor who fitted that on the patient for adjustment and possibly refitting".  Ortho technician said he will call the vendor.

## 2021-06-11 NOTE — Progress Notes (Addendum)
PROGRESS NOTE  [name] JTT:[id number] DOB: 06/28/1940 DOA: 06/06/2021 PCP: Willey Blade, MD   LOS: 4 days   Brief narrative:  [name] is a 81 y.o. female with medical history of diabetes type 2, hypertension, rheumatoid arthritis, breast and uterine cancer, chronic back pain, hyperlipidemia presented to the hospital with low back pain.  Patient is pretty much wheelchair-bound for the last 3 years and is currently at assisted living facility.  Patient was trying to reach out to the therapist when she felt a pop in her lower back and has been having pain since then.  MRI was obtained in the ED which showed the L1 inferior endplate fracture.  ED provider spoke with neurosurgery Dr. Zada Finders who recommended TSL0 versus and no surgical intervention.  In the ED, patient was also noted to have hyperkalemia and was on spironolactone as outpatient with a stage IIIb. At this time, patient is being managed with pain medication, TSLO brace but continues to have severe pain and had a lot of confusion and disorientation with narcotics.  IR has been consulted for possible kyphoplasty.   Assessment/Plan:  Principal Problem:   Closed L1 vertebral fracture (HCC) Active Problems:   Essential hypertension   Diabetes mellitus type 2 in obese (HCC)   CKD (chronic kidney disease), stage III (HCC)   Lumbago   Hyperkalemia   Pressure injury of skin  Closed L1 vertebral fracture with continued back pain.   Neurosurgery recommended TSLO brace with pain management but patient developed delirium and confusion with narcotics.  Currently, plan is kyphoplasty likely on 06/12/2021.  Continue to hold Eliquis.    Low-grade fever.  COVID and influenza was negative.  Lactate was within normal limits.  No leukocytosis.   Urinalysis does not show any obvious signs of infection.  Chest x-ray showed some low lung volumes with basilar opacity and interstitial opacities in the left lung.  Received 3-day course  of Rocephin and Zithromax for possible pneumonia.  We will discontinue antibiotic.  No further fevers.    Confusion, delirious state on 06/08/2021 likely multifactorial from hospital induced delirium, febrile episode, polypharmacy.  X-ray of the chest with some basilar opacities.  IV Dilaudid was discontinued.  Has improved at this time.  On empiric antibiotic.  Continue Percocet for now for pain.  Closely monitor for mentation..   Continue lumbar brace if able to place.  Hyperkalemia. Improved after Lokelma, latest potassium of 4.6.  BMP in AM.  Diabetes mellitus type 2.  Continue sliding scale insulin Accu-Cheks.  POC glucose of 131.   CKD stage IIIb .  Chronic.  Latest creatinine of 1.3.    Essential hypertension.  Continue metoprolol.  Spironolactone on hold due to hyperkalemia.  Last blood pressure of 138/63   Right buttocks stage II pressure injury present on admission.  Continue wound care.  History of VTE/saddle pulmonary embolism.  Eliquis on hold since 06/08/2021.   on heparin drip, will need to transition to Eliquis on discharge.  DVT prophylaxis:  Eliquis on hold.  We will continue with heparin drip.  Code Status: Full code  Family Communication:  Spoke with the patient's son on the phone on 06/11/21 and updated him about the clinical condition of the patient.   I spoke with the patient's primary care physician on the phone on 06/10/2021.  Status is: Inpatient  Remains inpatient appropriate because:  Ambulatory dysfunction, intractable back pain, possible need for rehabilitation placement,  for kyphoplasty on 06/12/2021.  Consultants: Neurosurgery-  Verbal IR for kyphoplasty  Procedures: TSLO brace application  Anti-infectives:  Rocephin and Zithromax 1/16> dced  Anti-infectives (From admission, onward)    Start     Dose/Rate Route Frequency Ordered Stop   06/10/21 1600  azithromycin (ZITHROMAX) tablet 500 mg  Status:  Discontinued        500 mg Oral Daily 06/10/21  0711 06/11/21 1150   06/08/21 1600  cefTRIAXone (ROCEPHIN) 2 g in sodium chloride 0.9 % 100 mL IVPB  Status:  Discontinued        2 g 200 mL/hr over 30 Minutes Intravenous Every 24 hours 06/08/21 1459 06/11/21 1150   06/08/21 1600  azithromycin (ZITHROMAX) 500 mg in sodium chloride 0.9 % 250 mL IVPB  Status:  Discontinued        500 mg 250 mL/hr over 60 Minutes Intravenous Every 24 hours 06/08/21 1459 06/10/21 0711      Subjective: Today, patient was seen and examined at bedside.  Complains of mild back pain.  Awaiting for kyphoplasty.  Denies any nausea vomiting fever chills or rigor.  Denies shortness of breath.  Objective: Vitals:   06/10/21 2155 06/11/21 0457  BP: (!) 146/70 138/63  Pulse: 82 80  Resp: 16 18  Temp: 98.6 F (37 C) 98.9 F (37.2 C)  SpO2: 96% 97%    Intake/Output Summary (Last 24 hours) at 06/11/2021 1201 Last data filed at 06/11/2021 2542 Gross per 24 hour  Intake 1983.5 ml  Output 1250 ml  Net 733.5 ml    Filed Weights   06/06/21 1852  Weight: 136.1 kg   Body mass index is 49.92 kg/m.   Physical Exam:  General: Morbidly obese built, not in obvious distress HENT:   No scleral pallor or icterus noted. Oral mucosa is moist.  Chest:  Clear breath sounds.  Diminished breath sounds bilaterally. No crackles or wheezes.  CVS: S1 &S2 heard. No murmur.  Regular rate and rhythm. Abdomen: Soft, nontender, nondistended.  Bowel sounds are heard.   Extremities: No cyanosis, clubbing or edema.  Peripheral pulses are palpable. Psych: Alert, awake and oriented, normal mood CNS:  No cranial nerve deficits.  Moves all extremities. Skin: Warm and dry.  No rashes noted.  Data Review: I have personally reviewed the following laboratory data and studies,  CBC: Recent Labs  Lab 06/06/21 2002 06/07/21 0536 06/08/21 0518 06/09/21 0503 06/10/21 0502 06/11/21 0444  WBC 7.7 7.1 7.2 6.8 6.1 6.9  NEUTROABS 4.9  --   --   --   --   --   HGB 12.1 12.0 11.6* 11.0*  11.1* 11.9*  HCT 40.4 41.2 40.5 38.9 38.8 40.6  MCV 90.8 92.6 94.4 94.4 95.1 93.5  PLT 326 314 323 305 307 706    Basic Metabolic Panel: Recent Labs  Lab 06/06/21 2002 06/07/21 0536 06/08/21 0518 06/09/21 0503 06/10/21 0502  NA 137 136 137 137 136  K 5.5* 4.6 5.5* 4.6 4.6  CL 107 107 103 103 102  CO2 25 25 26 26 27   GLUCOSE 91 119* 146* 124* 154*  BUN 31* 27* 27* 29* 18  CREATININE 1.68* 1.59* 1.75* 1.69* 1.34*  CALCIUM 9.1 9.1 9.5 9.1 9.1  MG  --   --  2.0 2.0 2.0    Liver Function Tests: Recent Labs  Lab 06/06/21 2002  AST 28  ALT 16  ALKPHOS 46  BILITOT 1.0  PROT 7.5  ALBUMIN 3.5    No results for input(s): LIPASE, AMYLASE in the last 168 hours.  No results for input(s): AMMONIA in the last 168 hours. Cardiac Enzymes: No results for input(s): CKTOTAL, CKMB, CKMBINDEX, TROPONINI in the last 168 hours. BNP (last 3 results) No results for input(s): BNP in the last 8760 hours.  ProBNP (last 3 results) No results for input(s): PROBNP in the last 8760 hours.  CBG: Recent Labs  Lab 06/10/21 0723 06/10/21 1140 06/10/21 1617 06/10/21 2151 06/11/21 0735  GLUCAP 153* 164* 144* 161* 131*    Recent Results (from the past 240 hour(s))  Resp Panel by RT-PCR (Flu A&B, Covid) Nasopharyngeal Swab     Status: None   Collection Time: 06/06/21  8:02 PM   Specimen: Nasopharyngeal Swab; Nasopharyngeal(NP) swabs in vial transport medium  Result Value Ref Range Status   SARS Coronavirus 2 by RT PCR NEGATIVE NEGATIVE Final    Comment: (NOTE) SARS-CoV-2 target nucleic acids are NOT DETECTED.  The SARS-CoV-2 RNA is generally detectable in upper respiratory specimens during the acute phase of infection. The lowest concentration of SARS-CoV-2 viral copies this assay can detect is 138 copies/mL. A negative result does not preclude SARS-Cov-2 infection and should not be used as the sole basis for treatment or other patient management decisions. A negative result may occur  with  improper specimen collection/handling, submission of specimen other than nasopharyngeal swab, presence of viral mutation(s) within the areas targeted by this assay, and inadequate number of viral copies(<138 copies/mL). A negative result must be combined with clinical observations, patient history, and epidemiological information. The expected result is Negative.  Fact Sheet for Patients:  EntrepreneurPulse.com.au  Fact Sheet for Healthcare Providers:  IncredibleEmployment.be  This test is no t yet approved or cleared by the [place] FDA and  has been authorized for detection and/or diagnosis of SARS-CoV-2 by FDA under an Emergency Use Authorization (EUA). This EUA will remain  in effect (meaning this test can be used) for the duration of the COVID-19 declaration under Section 564(b)(1) of the Act, 21 U.S.C.section 360bbb-3(b)(1), unless the authorization is terminated  or revoked sooner.       Influenza A by PCR NEGATIVE NEGATIVE Final   Influenza B by PCR NEGATIVE NEGATIVE Final    Comment: (NOTE) The Xpert Xpress SARS-CoV-2/FLU/RSV plus assay is intended as an aid in the diagnosis of influenza from Nasopharyngeal swab specimens and should not be used as a sole basis for treatment. Nasal washings and aspirates are unacceptable for Xpert Xpress SARS-CoV-2/FLU/RSV testing.  Fact Sheet for Patients: EntrepreneurPulse.com.au  Fact Sheet for Healthcare Providers: IncredibleEmployment.be  This test is not yet approved or cleared by the [place] FDA and has been authorized for detection and/or diagnosis of SARS-CoV-2 by FDA under an Emergency Use Authorization (EUA). This EUA will remain in effect (meaning this test can be used) for the duration of the COVID-19 declaration under Section 564(b)(1) of the Act, 21 U.S.C. section 360bbb-3(b)(1), unless the authorization is terminated  or revoked.  Performed at [facility], [place] [address] [place],  [address]       Studies: No results found.    Flora Lipps, MD  [facility] 06/11/2021  If 7PM-7AM, please contact night-coverage

## 2021-06-12 ENCOUNTER — Inpatient Hospital Stay (HOSPITAL_COMMUNITY): Payer: Medicare Other

## 2021-06-12 DIAGNOSIS — I10 Essential (primary) hypertension: Secondary | ICD-10-CM | POA: Diagnosis not present

## 2021-06-12 DIAGNOSIS — S32019A Unspecified fracture of first lumbar vertebra, initial encounter for closed fracture: Secondary | ICD-10-CM | POA: Diagnosis not present

## 2021-06-12 DIAGNOSIS — M545 Low back pain, unspecified: Secondary | ICD-10-CM | POA: Diagnosis not present

## 2021-06-12 DIAGNOSIS — N1832 Chronic kidney disease, stage 3b: Secondary | ICD-10-CM | POA: Diagnosis not present

## 2021-06-12 LAB — GLUCOSE, CAPILLARY
Glucose-Capillary: 112 mg/dL — ABNORMAL HIGH (ref 70–99)
Glucose-Capillary: 122 mg/dL — ABNORMAL HIGH (ref 70–99)
Glucose-Capillary: 136 mg/dL — ABNORMAL HIGH (ref 70–99)
Glucose-Capillary: 97 mg/dL (ref 70–99)

## 2021-06-12 LAB — PROTIME-INR
INR: 1.1 (ref 0.8–1.2)
Prothrombin Time: 13.7 seconds (ref 11.4–15.2)

## 2021-06-12 LAB — HEPARIN LEVEL (UNFRACTIONATED): Heparin Unfractionated: 0.35 IU/mL (ref 0.30–0.70)

## 2021-06-12 LAB — BASIC METABOLIC PANEL
Anion gap: 7 (ref 5–15)
BUN: 16 mg/dL (ref 8–23)
CO2: 29 mmol/L (ref 22–32)
Calcium: 9.7 mg/dL (ref 8.9–10.3)
Chloride: 100 mmol/L (ref 98–111)
Creatinine, Ser: 1.26 mg/dL — ABNORMAL HIGH (ref 0.44–1.00)
GFR, Estimated: 44 mL/min — ABNORMAL LOW (ref >60)
Glucose, Bld: 110 mg/dL — ABNORMAL HIGH (ref 70–99)
Potassium: 4.2 mmol/L (ref 3.5–5.1)
Sodium: 136 mmol/L (ref 135–145)

## 2021-06-12 LAB — MAGNESIUM: Magnesium: 2.2 mg/dL (ref 1.7–2.4)

## 2021-06-12 LAB — SURGICAL PCR SCREEN
MRSA, PCR: NEGATIVE
Staphylococcus aureus: NEGATIVE

## 2021-06-12 LAB — CBC
HCT: 38.1 % (ref 36.0–46.0)
Hemoglobin: 11.2 g/dL — ABNORMAL LOW (ref 12.0–15.0)
MCH: 27.3 pg (ref 26.0–34.0)
MCHC: 29.4 g/dL — ABNORMAL LOW (ref 30.0–36.0)
MCV: 92.7 fL (ref 80.0–100.0)
Platelets: 305 10*3/uL (ref 150–400)
RBC: 4.11 MIL/uL (ref 3.87–5.11)
RDW: 14.6 % (ref 11.5–15.5)
WBC: 6.7 10*3/uL (ref 4.0–10.5)
nRBC: 0 % (ref 0.0–0.2)

## 2021-06-12 MED ORDER — HEPARIN (PORCINE) 25000 UT/250ML-% IV SOLN
1300.0000 [IU]/h | INTRAVENOUS | Status: DC
  Administered 2021-06-12 – 2021-06-17 (×7): 1300 [IU]/h via INTRAVENOUS
  Filled 2021-06-12 (×6): qty 250

## 2021-06-12 MED ORDER — ONDANSETRON HCL 4 MG/2ML IJ SOLN
4.0000 mg | Freq: Four times a day (QID) | INTRAMUSCULAR | Status: DC | PRN
  Administered 2021-06-12: 4 mg via INTRAVENOUS
  Filled 2021-06-12: qty 2

## 2021-06-12 MED ORDER — CEFAZOLIN IN SODIUM CHLORIDE 3-0.9 GM/100ML-% IV SOLN
3.0000 g | Freq: Once | INTRAVENOUS | Status: DC
  Filled 2021-06-12: qty 100

## 2021-06-12 MED ORDER — HEPARIN BOLUS VIA INFUSION
4000.0000 [IU] | Freq: Once | INTRAVENOUS | Status: AC
  Administered 2021-06-12: 4000 [IU] via INTRAVENOUS
  Filled 2021-06-12: qty 4000

## 2021-06-12 MED ORDER — LIDOCAINE HCL (PF) 1 % IJ SOLN
INTRAMUSCULAR | Status: AC
  Filled 2021-06-12: qty 30

## 2021-06-12 MED ORDER — CEFAZOLIN SODIUM-DEXTROSE 2-4 GM/100ML-% IV SOLN: 2.0000 g | Freq: Three times a day (TID) | INTRAVENOUS | Status: DC

## 2021-06-12 NOTE — Progress Notes (Signed)
[name] for [name] >> heparin drip Indication: history of saddle PE  Allergies  Allergen Reactions   Iodine Rash    Patient Measurements: Height: 5\' 5"  (165.1 cm) Weight: 136.1 kg (300 lb) IBW/kg (Calculated) : 57 Heparin Dosing Weight: 90.7 kg  Vital Signs: Temp: 97.8 F (36.6 C) (01/19 2113) Temp Source: Oral (01/19 2113) BP: 158/101 (01/19 2113) Pulse Rate: 86 (01/19 2113)  Labs: Recent Labs    06/09/21 1100 06/09/21 1958 06/10/21 0502 06/10/21 1315 06/11/21 0444 06/12/21 0459  HGB  --    < > 11.1*  --  11.9* 11.2*  HCT  --   --  38.8  --  40.6 38.1  PLT  --   --  307  --  315 305  APTT 31  --   --   --   --   --   LABPROT  --   --   --   --   --  13.7  INR  --   --   --   --   --  1.1  HEPARINUNFRC 0.49   < > 0.68 0.60 0.65 0.35  CREATININE  --   --  1.34*  --   --  1.26*   < > = values in this interval not displayed.     Estimated Creatinine Clearance: 51.5 mL/min (A) (by C-G formula based on SCr of 1.26 mg/dL (H)).   Medical History: Past Medical History:  Diagnosis Date   Allergy    Arthritis    "joints; mostly on the right side" (01/11/2013)   Breast cancer ([place])    "left" (01/11/2013)   Cancer ([place])    endometrial   Chronic lower back pain    Cold    just started 6/44/03 cough   Complication of anesthesia    very phobic about needles.last surgery had to sedate before doing iv   Depression    Exertional shortness of breath    Hyperlipidemia    Hypertension    Osteoporosis    knees    Personal history of radiation therapy 03/2013   left   Status post radiation therapy within last four weeks 03/19/13-05/03/13   lt breast 60.4GY   Uterine cancer (HCC)     Medications:  Scheduled:   acidophilus  1 capsule Oral Daily   vitamin C  250 mg Oral BID   benzonatate  200 mg Oral TID   bisacodyl  10 mg Rectal Daily   buPROPion  150 mg Oral BID   docusate sodium  100 mg Oral Daily   donepezil  5 mg  Oral Daily   DULoxetine  20 mg Oral Daily   exemestane  25 mg Oral Daily   famotidine  20 mg Oral BID   gabapentin  200 mg Oral BID   insulin aspart  0-15 Units Subcutaneous TID WC & HS   linaclotide  72 mcg Oral q morning   metoprolol succinate  50 mg Oral BID   multivitamin with minerals  1 tablet Oral Daily   polyethylene glycol  17 g Oral Daily   Ensure Max Protein  11 oz Oral BID   senna-docusate  1 tablet Oral BID   zinc sulfate  220 mg Oral Daily    Assessment: 81 year old female with notable PMH of saddle PE (2017) on [name] 5mg  PO BID presents with back pain after working with therapy.  MRI in the ED obtained which showed L1  inferior endplate fracture with no cord involvement.  NSGY advised for TLSO brace to be placed and no surgical intervention needed.  TRH discussing with IR for the possibility of kyphoplasty.  [name] has been held - last dose 1/16 @ 1059.  Pharmacy has been consulted to dose heparin drip while [name] is on hold.  Today, 06/12/21 - Hgb stable, plts wnl - Heparin Level 0.35 (therapeutic) on heparin 1300 units/hr - No bleeding per RN report or infusion problems per RN report - Has order from IR to turn off heparin infusion at 0800 this morning for kyphoplasty  Goal of Therapy:  Monitor platelets by anticoagulation protocol: Yes   Plan:  Stop heparin as ordered at 0800 today for kyphoplasty Await post-procedure orders for resuming anticoagulation Monitor HL and CBC daily while on heparin Follow-up plans for transition back to [place], PharmD, [place] [phone] 06/12/2021  7:14 AM

## 2021-06-12 NOTE — TOC Progression Note (Addendum)
Transition of Care [facility]) - Progression Note    Patient Details  Name: [name] MRN: [id number] Date of Birth: 07-08-1940  [facility] CM/SW Contact  Ceazia Harb, Marjie Skiff, RN Phone Number: 06/12/2021, 1:25 PM  Clinical Narrative:    [facility] has been called x4 to connect about getting pt back to ALF when discharged. [facility]'s voicemail has been full. Called the main number and spoke to a [employer]. Wells Guiles asked to relay the message that pt may be ready to dc back to ALF over the weekend. Wells Guiles asked if she could go to rehab for awhile. Wells Guiles was informed that pt is wheelchair bound at baseline and would come back to ALF not SNF. FL2 has been started. Will need [facility]AR transport home. TOC will continue to follow.   1443: Spoke with pt son Dr. Vickki Muff to answer questions around pt returning to [facility]. Also finally connected with Chrys Racer at the [facility] and faxed her requested clinicals.     Expected Discharge Plan: Assisted Living Barriers to Discharge: Continued Medical Work up  Expected Discharge Plan and Services Expected Discharge Plan: Assisted Living   Discharge Planning Services: CM Consult   Living arrangements for the past 2 months: [place]                   Readmission Risk Interventions Readmission Risk Prevention Plan 06/10/2021  Transportation Screening Complete  PCP or Specialist Appt within 5-7 Days Complete  Home Care Screening Complete  Medication Review (RN CM) Complete  Some recent data might be hidden

## 2021-06-12 NOTE — Progress Notes (Signed)
[place] for Eliquis >> heparin drip Indication: history of saddle PE  Allergies  Allergen Reactions   Iodine Rash    Patient Measurements: Height: 5\' 5"  (165.1 cm) Weight: 136.1 kg (300 lb) IBW/kg (Calculated) : 57 Heparin Dosing Weight: 90.7 kg  Vital Signs: Temp: 97.9 F (36.6 C) (01/20 1326) Temp Source: Oral (01/20 1326) BP: 154/78 (01/20 1326) Pulse Rate: 88 (01/20 1326)  Labs: Recent Labs    06/10/21 0502 06/10/21 1315 06/11/21 0444 06/12/21 0459  HGB 11.1*  --  11.9* 11.2*  HCT 38.8  --  40.6 38.1  PLT 307  --  315 305  LABPROT  --   --   --  13.7  INR  --   --   --  1.1  HEPARINUNFRC 0.68 0.60 0.65 0.35  CREATININE 1.34*  --   --  1.26*     Estimated Creatinine Clearance: 51.5 mL/min (A) (by C-G formula based on SCr of 1.26 mg/dL (H)).  Assessment: 81 year old female with notable PMH of saddle PE (2017) on Eliquis 5mg  PO BID presents with back pain after working with therapy.  MRI in the ED obtained which showed L1 inferior endplate fracture with no cord involvement.  NSGY advised for TLSO brace to be placed and no surgical intervention needed.  TRH discussing with IR for the possibility of kyphoplasty.  Eliquis has been held - last dose 1/16 @ 1059.  Pharmacy has been consulted to dose heparin drip while Eliquis is on hold.  Today, 06/12/21 - Hgb stable, plts wnl - AM Heparin Level 0.35 (therapeutic) on heparin 1300 units/hr - No bleeding per RN report or infusion problems per RN report - Has order from IR to turn off heparin infusion at 0800 this morning for kyphoplasty 2nd shift F/U Kyphoplasty not performed b/c pt desaturated to 60s/70s after placed prone on IR table. Rescheduled for 1/125 @ 1230. Per TRH: resume heparin drip  Goal of Therapy:  Monitor platelets by anticoagulation protocol: Yes   Plan:  Heparin bolus 4000 units IV x 1 Resume heparin at prior therapeutic rate of 1300 units/hr and check 8 hr  heparin level Monitor Heparin level and CBC daily while on heparin Consider changing to full dose LMWH if needed to reduce lab draws  Eudelia Bunch, Pharm.D 06/12/2021 3:15 PM

## 2021-06-12 NOTE — Progress Notes (Signed)
Patient ID: [name], female   DOB: [birth year], 81 y.o.   MRN: [id number] Pt unable to tolerate positioning for KP today with conventional sedation protocol, therefore procedure cancelled. Pt has been rescheduled for KP with anesthesia assistance tent for 1/25. Pt/nurse aware. Recommend procedure be done as IP due to need for anesthesia assistance rather than as OP which would delay scheduling even further out.

## 2021-06-12 NOTE — Progress Notes (Addendum)
PROGRESS NOTE    [name]  HYQ:[id number] DOB: [birth year] DOA: 06/06/2021 PCP: Willey Blade, MD    Brief Narrative:  [name] was admitted to the hospital with the working diagnosis of L1 vertebral fracture.   81 yo female with the past medical history of T2DM, HTN, breast cancer, dyslipidemia and chronic back pain who presented with worsening back pain. Patient uses a wheelchair for the last 3 years, but able to stand and walk to the bathroom. Over the last 3 days leading to her hospitalization her back pain was worse in intensity, and not able to stand on her feet anymore. Before her back pain worsened she felt a "pop" in her back while doing therapy. On her initial physical examination her blood pressure was 130/65, HR 86, RR 20 and oxygen saturation 95% on room air. Her lungs were clear to auscultation, heart with S1 and S2 present and rhythmic, abdomen soft and no lower extremity edema.   Na 137, K 5,5 CL 107 and bicarb 25, glucose 91 BUN 31 and cr 1,.68 Wbc 7,7 hgb 12,1 and hct 40,4 with plt 326  SARS COVID 19 negative   UA with sg 1,012 with negative nitrates,   Chest radiograph with hypo-inflation right base atelectasis   Spine MRI with acute inferior endplate fracture of L1 without loss of retropulsion. Mild bone marrow edema.  Spinal canal stenosis T10 -11 Chronic L4 compression deformity   Assessment & Plan:   Principal Problem:   Closed L1 vertebral fracture (HCC) Active Problems:   Essential hypertension   Diabetes mellitus type 2 in obese (HCC)   CKD (chronic kidney disease), stage III (HCC)   Lumbago   Hyperkalemia   Pressure injury of skin   Acute L1 vertebral fracture. Patient continue to have back pain, she was not able to undergo kypholasty due to not able to stay supine.  Plan to continue pain control and re-attempt kyphoplasty with anesthesia. Patient with very poor mobility at this point in time.   2. Hyperkalemia and CKD stage 3b. Renal  function with serum cr at 1,26, K is down to 4,2 and serum bicarbonate at 29 Continue close follow up on renal function and electrolytes   3. T2DM fasting glucose is 110,. Continue with insulin sliding scale for glucose cover and monitoring   4. HTN continue blood pressure monitoring . Continue with metoprolol  5. Right buttock pressure ulcer stage 2 Continue pressure ulcer care per unit protocol   6. 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   7. Acute metabolic encephalopathy  Patient with no confusion or agitation, encephalopathy has resolved.   Patient continue to be at high risk for worsening back pain   Status is: Inpatient  Remains inpatient appropriate because: kypholplasty     DVT prophylaxis:  Heparin   Code Status:    full  Family Communication:   No family at the bedside      Nutrition Status: Nutrition Problem: Increased nutrient needs Etiology: wound healing Signs/Symptoms: estimated needs Interventions: Premier Protein, MVI     Skin Documentation: Pressure Injury 06/07/21 Buttocks Right Stage 2 -  Partial thickness loss of dermis presenting as a shallow open injury with a red, pink wound bed without slough. open skin with pink center, sourrounding by epithelial tissue (Active)  06/07/21 0500  Location: Buttocks  Location Orientation: Right  Staging: Stage 2 -  Partial thickness loss of dermis presenting as a shallow  open injury with a red, pink wound bed without slough.  Wound Description (Comments): open skin with pink center, sourrounding by epithelial tissue  Present on Admission: Yes     Consultants:  IR     Subjective: Patient with persistent back pain, no dyspnea or chest pain   Objective: Vitals:   06/11/21 0457 06/11/21 1403 06/11/21 2113 06/12/21 1326  BP: 138/63 (!) 159/83 (!) 158/101 (!) 154/78  Pulse: 80 81 86 88  Resp: 18 18 20 19   Temp: 98.9 F (37.2 C) 97.7 F (36.5 C)  97.8 F (36.6 C) 97.9 F (36.6 C)  TempSrc:  Oral Oral Oral  SpO2: 97% 97% 95% 98%  Weight:      Height:        Intake/Output Summary (Last 24 hours) at 06/12/2021 1736 Last data filed at 06/12/2021 1209 Gross per 24 hour  Intake 166.11 ml  Output 350 ml  Net -183.89 ml   Filed Weights   06/06/21 1852  Weight: 136.1 kg    Examination:   General:  deconditioned and in pain  Neurology: Awake and alert, non focal  E ENT: no pallor, no icterus, oral mucosa  Cardiovascular: heart with S1 and S2 present and rhythmic Pulmonary: lungs clear to auscultation on anterior auscultation  Gastrointestinal. Protuberant but not tender or distended  Skin. No rashes Musculoskeletal: no joint deformities     Data Reviewed: I have personally reviewed following labs and imaging studies  CBC: Recent Labs  Lab 06/06/21 2002 06/07/21 0536 06/08/21 0518 06/09/21 0503 06/10/21 0502 06/11/21 0444 06/12/21 0459  WBC 7.7   < > 7.2 6.8 6.1 6.9 6.7  NEUTROABS 4.9  --   --   --   --   --   --   HGB 12.1   < > 11.6* 11.0* 11.1* 11.9* 11.2*  HCT 40.4   < > 40.5 38.9 38.8 40.6 38.1  MCV 90.8   < > 94.4 94.4 95.1 93.5 92.7  PLT 326   < > 323 305 307 315 305   < > = values in this interval not displayed.   Basic Metabolic Panel: Recent Labs  Lab 06/07/21 0536 06/08/21 0518 06/09/21 0503 06/10/21 0502 06/12/21 0459  NA 136 137 137 136 136  K 4.6 5.5* 4.6 4.6 4.2  CL 107 103 103 102 100  CO2 25 26 26 27 29   GLUCOSE 119* 146* 124* 154* 110*  BUN 27* 27* 29* 18 16  CREATININE 1.59* 1.75* 1.69* 1.34* 1.26*  CALCIUM 9.1 9.5 9.1 9.1 9.7  MG  --  2.0 2.0 2.0 2.2   GFR: Estimated Creatinine Clearance: 51.5 mL/min (A) (by C-G formula based on SCr of 1.26 mg/dL (H)). Liver Function Tests: Recent Labs  Lab 06/06/21 2002  AST 28  ALT 16  ALKPHOS 46  BILITOT 1.0  PROT 7.5  ALBUMIN 3.5   No results for input(s): LIPASE, AMYLASE in the last 168 hours. No results for input(s): AMMONIA  in the last 168 hours. Coagulation Profile: Recent Labs  Lab 06/12/21 0459  INR 1.1   Cardiac Enzymes: No results for input(s): CKTOTAL, CKMB, CKMBINDEX, TROPONINI in the last 168 hours. BNP (last 3 results) No results for input(s): PROBNP in the last 8760 hours. HbA1C: No results for input(s): HGBA1C in the last 72 hours. CBG: Recent Labs  Lab 06/11/21 1633 06/11/21 2114 06/12/21 0749 06/12/21 1146 06/12/21 1633  GLUCAP 134* 144* 112* 122* 136*   Lipid Profile: No results for input(s): CHOL,  HDL, LDLCALC, TRIG, CHOLHDL, LDLDIRECT in the last 72 hours. Thyroid Function Tests: No results for input(s): TSH, T4TOTAL, FREET4, T3FREE, THYROIDAB in the last 72 hours. Anemia Panel: No results for input(s): VITAMINB12, FOLATE, FERRITIN, TIBC, IRON, RETICCTPCT in the last 72 hours.    Radiology Studies: I have reviewed all of the imaging during this hospital visit personally     Scheduled Meds:  acidophilus  1 capsule Oral Daily   vitamin C  250 mg Oral BID   benzonatate  200 mg Oral TID   bisacodyl  10 mg Rectal Daily   buPROPion  150 mg Oral BID   docusate sodium  100 mg Oral Daily   donepezil  5 mg Oral Daily   DULoxetine  20 mg Oral Daily   exemestane  25 mg Oral Daily   famotidine  20 mg Oral BID   gabapentin  200 mg Oral BID   insulin aspart  0-15 Units Subcutaneous TID WC & HS   linaclotide  72 mcg Oral q morning   metoprolol succinate  50 mg Oral BID   multivitamin with minerals  1 tablet Oral Daily   polyethylene glycol  17 g Oral Daily   Ensure Max Protein  11 oz Oral BID   senna-docusate  1 tablet Oral BID   zinc sulfate  220 mg Oral Daily   Continuous Infusions:   ceFAZolin (ANCEF) IV     heparin 1,300 Units/hr (06/12/21 1640)     LOS: 5 days        Edwardine Deschepper Gerome Apley, MD

## 2021-06-13 DIAGNOSIS — E875 Hyperkalemia: Secondary | ICD-10-CM | POA: Diagnosis not present

## 2021-06-13 DIAGNOSIS — I10 Essential (primary) hypertension: Secondary | ICD-10-CM | POA: Diagnosis not present

## 2021-06-13 DIAGNOSIS — S32019A Unspecified fracture of first lumbar vertebra, initial encounter for closed fracture: Secondary | ICD-10-CM | POA: Diagnosis not present

## 2021-06-13 DIAGNOSIS — E1169 Type 2 diabetes mellitus with other specified complication: Secondary | ICD-10-CM | POA: Diagnosis not present

## 2021-06-13 LAB — HEPARIN LEVEL (UNFRACTIONATED)
Heparin Unfractionated: 0.42 IU/mL (ref 0.30–0.70)
Heparin Unfractionated: 0.52 IU/mL (ref 0.30–0.70)

## 2021-06-13 LAB — BASIC METABOLIC PANEL
Anion gap: 9 (ref 5–15)
BUN: 15 mg/dL (ref 8–23)
CO2: 30 mmol/L (ref 22–32)
Calcium: 9.8 mg/dL (ref 8.9–10.3)
Chloride: 99 mmol/L (ref 98–111)
Creatinine, Ser: 1.33 mg/dL — ABNORMAL HIGH (ref 0.44–1.00)
GFR, Estimated: 41 mL/min — ABNORMAL LOW (ref >60)
Glucose, Bld: 98 mg/dL (ref 70–99)
Potassium: 4.1 mmol/L (ref 3.5–5.1)
Sodium: 138 mmol/L (ref 135–145)

## 2021-06-13 LAB — CBC
HCT: 38.7 % (ref 36.0–46.0)
Hemoglobin: 11.3 g/dL — ABNORMAL LOW (ref 12.0–15.0)
MCH: 27 pg (ref 26.0–34.0)
MCHC: 29.2 g/dL — ABNORMAL LOW (ref 30.0–36.0)
MCV: 92.6 fL (ref 80.0–100.0)
Platelets: 300 10*3/uL (ref 150–400)
RBC: 4.18 MIL/uL (ref 3.87–5.11)
RDW: 14.8 % (ref 11.5–15.5)
WBC: 7.3 10*3/uL (ref 4.0–10.5)
nRBC: 0 % (ref 0.0–0.2)

## 2021-06-13 LAB — GLUCOSE, CAPILLARY
Glucose-Capillary: 112 mg/dL — ABNORMAL HIGH (ref 70–99)
Glucose-Capillary: 152 mg/dL — ABNORMAL HIGH (ref 70–99)
Glucose-Capillary: 141 mg/dL — ABNORMAL HIGH (ref 70–99)
Glucose-Capillary: 110 mg/dL — ABNORMAL HIGH (ref 70–99)

## 2021-06-13 NOTE — Progress Notes (Signed)
[facility] for Eliquis >> heparin drip Indication: history of saddle PE  Allergies  Allergen Reactions   Iodine Rash    Patient Measurements: Height: 5\' 5"  (165.1 cm) Weight: 136.1 kg (300 lb) IBW/kg (Calculated) : 57 Heparin Dosing Weight: 90.7 kg  Vital Signs: Temp: 97.9 F (36.6 C) (01/20 2028) Temp Source: Oral (01/20 2028) BP: 151/71 (01/20 2028) Pulse Rate: 85 (01/20 2028)  Labs: Recent Labs    06/10/21 0502 06/10/21 1315 06/11/21 0444 06/12/21 0459 06/12/21 2339  HGB 11.1*  --  11.9* 11.2*  --   HCT 38.8  --  40.6 38.1  --   PLT 307  --  315 305  --   LABPROT  --   --   --  13.7  --   INR  --   --   --  1.1  --   HEPARINUNFRC 0.68   < > 0.65 0.35 0.42  CREATININE 1.34*  --   --  1.26*  --    < > = values in this interval not displayed.     Estimated Creatinine Clearance: 51.5 mL/min (A) (by C-G formula based on SCr of 1.26 mg/dL (H)).  Assessment: 81 year old female with notable PMH of saddle PE (2017) on Eliquis 5mg  PO BID presents with back pain after working with therapy.  MRI in the ED obtained which showed L1 inferior endplate fracture with no cord involvement.  NSGY advised for TLSO brace to be placed and no surgical intervention needed.  TRH discussing with IR for the possibility of kyphoplasty.  Eliquis has been held - last dose 1/16 @ 1059.  Pharmacy has been consulted to dose heparin drip while Eliquis is on hold. 1/20: Heparin held for kyphoplasty which was cancelled and rescheduled for 1/25.  Heparin resumed at previously therapeutic rate.   Today, 06/13/21 - Hgb stable, plts wnl - Heparin Level 0.42 (therapeutic) on heparin 1300 units/hr - No bleeding noted or infusion problems per RN report  Goal of Therapy:  Monitor platelets by anticoagulation protocol: Yes   Plan:  Continue heparin infusion at 1300 units/hr  Monitor Heparin level and CBC daily while on heparin  Netta Cedars,  Pharm.D 06/13/2021 1:25 AM

## 2021-06-13 NOTE — Progress Notes (Signed)
PROGRESS NOTE    [name]  HMC:[id number] DOB: 1940-12-10 DOA: 06/06/2021 PCP: Willey Blade, MD   Brief Narrative:  [name] is a 81 y.o. female with medical history of diabetes type 2, hypertension, rheumatoid arthritis, breast and uterine cancer, chronic back pain, hyperlipidemia presented to the hospital with low back pain.  Patient is pretty much wheelchair-bound for the last 3 years and is currently at assisted living facility.  Patient was trying to reach out to the therapist when she felt a pop in her lower back and has been having pain since then.  MRI was obtained in the ED which showed the L1 inferior endplate fracture.  ED provider spoke with neurosurgery Dr. Zada Finders who recommended TSL0 versus and no surgical intervention.  In the ED, patient was also noted to have hyperkalemia and was on spironolactone as outpatient with a stage IIIb. At this time, patient is being managed with pain medication, TSLO brace but continues to have severe pain and had a lot of confusion and disorientation with narcotics.  IR has been consulted for possible kyphoplasty.  Assessment & Plan:  Closed L1 vertebral fracture:   -Neurosurgery recommended TSLO brace with pain management but patient developed delirium and confusion with narcotics.  -Due to severe back pain she was not able to undergo kyphoplasty on 06/12/2021  -Plan is for kyphoplasty under anesthesia on 06/17/2021 by IR  -Continue as needed pain medications  low-grade fever: -Resolved.   -No leukocytosis, lactic acid: WNL.  Chest x-ray showed some low lung volumes with basilar opacity and interstitial opacities in the left lung.  Received 3-day course of Rocephin and Zithromax for possible pneumonia.   -Antibiotics discontinued.  COVID-19 negative.  Acute metabolic encephalopathy: Resolved -Patient is alert and oriented now   Hyperkalemia. -Resolved with Lokelma   Diabetes mellitus type 2.  Continue sliding scale  insulin Accu-Cheks.     CKD stage IIIb .  Chronic.  Latest creatinine of 1.3.   -Continue to monitor.   Essential hypertension stable - continue metoprolol.  Spironolactone on hold due to hyperkalemia.    Right buttocks stage II pressure injury present on admission.  Continue wound care.   History of VTE/saddle pulmonary embolism.  Eliquis on hold since 06/08/2021.   on heparin drip, will need to transition to Eliquis on discharge.   DVT prophylaxis: Heparin Code Status: Full code Family Communication:  None present at bedside.  Plan of care discussed with patient in length and he verbalized understanding and agreed with it. Disposition Plan: To be determined  Consultants:  Neurosurgery IR  Procedures:  None  Antimicrobials:  None  Status is: Inpatient    Subjective: Patient seen and examined.  Resting comfortably on the bed.  Tells me that she is feels tired as she was not able to sleep last night.  Her pain is better now.  No nausea, vomiting, chest pain or shortness of breath.  No acute events overnight.  Objective: Vitals:   06/12/21 1326 06/12/21 2028 06/13/21 0441 06/13/21 0841  BP: (!) 154/78 (!) 151/71 (!) 118/59 118/64  Pulse: 88 85 82 84  Resp: 19 18 20  (!) 22  Temp: 97.9 F (36.6 C) 97.9 F (36.6 C) 98.7 F (37.1 C) 98.2 F (36.8 C)  TempSrc: Oral Oral Oral Oral  SpO2: 98% 100% 94% 97%  Weight:      Height:        Intake/Output Summary (Last 24 hours) at 06/13/2021 1341 Last data filed at 06/13/2021  9983 Gross per 24 hour  Intake 56.28 ml  Output 600 ml  Net -543.72 ml   Filed Weights   06/06/21 1852  Weight: 136.1 kg    Examination:  General exam: Appears calm and comfortable, obese Respiratory system: Clear to auscultation. Respiratory effort normal. Cardiovascular system: S1 & S2 heard, RRR. No JVD, murmurs, rubs, gallops or clicks. No pedal edema. Gastrointestinal system: Abdomen is nondistended, soft and nontender. No organomegaly or  masses felt. Normal bowel sounds heard. Central nervous system: Alert and oriented.  Answers appropriately. Skin: No rashes, lesions or ulcers   Data Reviewed: I have personally reviewed following labs and imaging studies  CBC: Recent Labs  Lab 06/06/21 2002 06/07/21 0536 06/09/21 0503 06/10/21 0502 06/11/21 0444 06/12/21 0459 06/13/21 0539  WBC 7.7   < > 6.8 6.1 6.9 6.7 7.3  NEUTROABS 4.9  --   --   --   --   --   --   HGB 12.1   < > 11.0* 11.1* 11.9* 11.2* 11.3*  HCT 40.4   < > 38.9 38.8 40.6 38.1 38.7  MCV 90.8   < > 94.4 95.1 93.5 92.7 92.6  PLT 326   < > 305 307 315 305 300   < > = values in this interval not displayed.   Basic Metabolic Panel: Recent Labs  Lab 06/08/21 0518 06/09/21 0503 06/10/21 0502 06/12/21 0459 06/13/21 0539  NA 137 137 136 136 138  K 5.5* 4.6 4.6 4.2 4.1  CL 103 103 102 100 99  CO2 26 26 27 29 30   GLUCOSE 146* 124* 154* 110* 98  BUN 27* 29* 18 16 15   CREATININE 1.75* 1.69* 1.34* 1.26* 1.33*  CALCIUM 9.5 9.1 9.1 9.7 9.8  MG 2.0 2.0 2.0 2.2  --    GFR: Estimated Creatinine Clearance: 48.8 mL/min (A) (by C-G formula based on SCr of 1.33 mg/dL (H)). Liver Function Tests: Recent Labs  Lab 06/06/21 2002  AST 28  ALT 16  ALKPHOS 46  BILITOT 1.0  PROT 7.5  ALBUMIN 3.5   No results for input(s): LIPASE, AMYLASE in the last 168 hours. No results for input(s): AMMONIA in the last 168 hours. Coagulation Profile: Recent Labs  Lab 06/12/21 0459  INR 1.1   Cardiac Enzymes: No results for input(s): CKTOTAL, CKMB, CKMBINDEX, TROPONINI in the last 168 hours. BNP (last 3 results) No results for input(s): PROBNP in the last 8760 hours. HbA1C: No results for input(s): HGBA1C in the last 72 hours. CBG: Recent Labs  Lab 06/12/21 1146 06/12/21 1633 06/12/21 2031 06/13/21 0743 06/13/21 1223  GLUCAP 122* 136* 97 112* 152*   Lipid Profile: No results for input(s): CHOL, HDL, LDLCALC, TRIG, CHOLHDL, LDLDIRECT in the last 72  hours. Thyroid Function Tests: No results for input(s): TSH, T4TOTAL, FREET4, T3FREE, THYROIDAB in the last 72 hours. Anemia Panel: No results for input(s): VITAMINB12, FOLATE, FERRITIN, TIBC, IRON, RETICCTPCT in the last 72 hours. Sepsis Labs: Recent Labs  Lab 06/06/21 2002  LATICACIDVEN 1.2    Recent Results (from the past 240 hour(s))  Resp Panel by RT-PCR (Flu A&B, Covid) Nasopharyngeal Swab     Status: None   Collection Time: 06/06/21  8:02 PM   Specimen: Nasopharyngeal Swab; Nasopharyngeal(NP) swabs in vial transport medium  Result Value Ref Range Status   SARS Coronavirus 2 by RT PCR NEGATIVE NEGATIVE Final    Comment: (NOTE) SARS-CoV-2 target nucleic acids are NOT DETECTED.  The SARS-CoV-2 RNA is generally detectable in upper  respiratory specimens during the acute phase of infection. The lowest concentration of SARS-CoV-2 viral copies this assay can detect is 138 copies/mL. A negative result does not preclude SARS-Cov-2 infection and should not be used as the sole basis for treatment or other patient management decisions. A negative result may occur with  improper specimen collection/handling, submission of specimen other than nasopharyngeal swab, presence of viral mutation(s) within the areas targeted by this assay, and inadequate number of viral copies(<138 copies/mL). A negative result must be combined with clinical observations, patient history, and epidemiological information. The expected result is Negative.  Fact Sheet for Patients:  EntrepreneurPulse.com.au  Fact Sheet for Healthcare Providers:  IncredibleEmployment.be  This test is no t yet approved or cleared by the [place] FDA and  has been authorized for detection and/or diagnosis of SARS-CoV-2 by FDA under an Emergency Use Authorization (EUA). This EUA will remain  in effect (meaning this test can be used) for the duration of the COVID-19 declaration under  Section 564(b)(1) of the Act, 21 U.S.C.section 360bbb-3(b)(1), unless the authorization is terminated  or revoked sooner.       Influenza A by PCR NEGATIVE NEGATIVE Final   Influenza B by PCR NEGATIVE NEGATIVE Final    Comment: (NOTE) The Xpert Xpress SARS-CoV-2/FLU/RSV plus assay is intended as an aid in the diagnosis of influenza from Nasopharyngeal swab specimens and should not be used as a sole basis for treatment. Nasal washings and aspirates are unacceptable for Xpert Xpress SARS-CoV-2/FLU/RSV testing.  Fact Sheet for Patients: EntrepreneurPulse.com.au  Fact Sheet for Healthcare Providers: IncredibleEmployment.be  This test is not yet approved or cleared by the [place] FDA and has been authorized for detection and/or diagnosis of SARS-CoV-2 by FDA under an Emergency Use Authorization (EUA). This EUA will remain in effect (meaning this test can be used) for the duration of the COVID-19 declaration under Section 564(b)(1) of the Act, 21 U.S.C. section 360bbb-3(b)(1), unless the authorization is terminated or revoked.  Performed at [facility], [place] [address] [place], [place] [address]   Surgical PCR screen     Status: None   Collection Time: 06/11/21 11:09 PM   Specimen: Nasal Mucosa; Nasal Swab  Result Value Ref Range Status   MRSA, PCR NEGATIVE NEGATIVE Final   Staphylococcus aureus NEGATIVE NEGATIVE Final    Comment: (NOTE) The Xpert SA Assay (FDA approved for NASAL specimens in patients 86 years of age and older), is one component of a comprehensive surveillance program. It is not intended to diagnose infection nor to guide or monitor treatment. Performed at [facility], [place] [address], [place], [place] [address]       Radiology Studies: No results found.  Scheduled Meds:  acidophilus  1 capsule Oral Daily   vitamin C  250 mg Oral BID   benzonatate  200 mg Oral TID    bisacodyl  10 mg Rectal Daily   buPROPion  150 mg Oral BID   docusate sodium  100 mg Oral Daily   donepezil  5 mg Oral Daily   DULoxetine  20 mg Oral Daily   exemestane  25 mg Oral Daily   famotidine  20 mg Oral BID   gabapentin  200 mg Oral BID   insulin aspart  0-15 Units Subcutaneous TID WC & HS   linaclotide  72 mcg Oral q morning   metoprolol succinate  50 mg Oral BID   multivitamin with minerals  1 tablet Oral Daily   polyethylene  glycol  17 g Oral Daily   Ensure Max Protein  11 oz Oral BID   senna-docusate  1 tablet Oral BID   zinc sulfate  220 mg Oral Daily   Continuous Infusions:   ceFAZolin (ANCEF) IV     heparin 1,300 Units/hr (06/12/21 1640)     LOS: 6 days   Time spent: 35 minutes   Kainoa Swoboda Loann Quill, MD [facility]  If 7PM-7AM, please contact night-coverage [email or web address] 06/13/2021, 1:41 PM

## 2021-06-13 NOTE — Progress Notes (Signed)
[facility] for Eliquis >> heparin drip Indication: history of saddle PE  Allergies  Allergen Reactions   Iodine Rash    Patient Measurements: Height: 5\' 5"  (165.1 cm) Weight: 136.1 kg (300 lb) IBW/kg (Calculated) : 57 Heparin Dosing Weight: 90.7 kg  Vital Signs: Temp: 98.7 F (37.1 C) (01/21 0441) Temp Source: Oral (01/21 0441) BP: 118/59 (01/21 0441) Pulse Rate: 82 (01/21 0441)  Labs: Recent Labs    06/11/21 0444 06/12/21 0459 06/12/21 2339 06/13/21 0539  HGB 11.9* 11.2*  --  11.3*  HCT 40.6 38.1  --  38.7  PLT 315 305  --  300  LABPROT  --  13.7  --   --   INR  --  1.1  --   --   HEPARINUNFRC 0.65 0.35 0.42 0.52  CREATININE  --  1.26*  --  1.33*     Estimated Creatinine Clearance: 48.8 mL/min (A) (by C-G formula based on SCr of 1.33 mg/dL (H)).  Assessment: 81 year old female with notable PMH of saddle PE (2017) on Eliquis 5mg  PO BID presents with back pain after working with therapy.  MRI in the ED obtained which showed L1 inferior endplate fracture with no cord involvement.  NSGY advised for TLSO brace to be placed and no surgical intervention needed.  TRH discussing with IR for the possibility of kyphoplasty.  Eliquis has been held - last dose 1/16 @ 1059.  Pharmacy has been consulted to dose heparin drip while Eliquis is on hold.  1/20: Heparin held for kyphoplasty which was cancelled and rescheduled for 1/25.  Heparin resumed at previously therapeutic rate.   Today, 06/13/2021: - Heparin level remains therapeutic at 0.52 with drip running at 1300 units/hr - Hgb stable, plts wnl - No bleeding documented  Goal of Therapy:  Monitor platelets by anticoagulation protocol: Yes   Plan:  - Continue heparin infusion at 1300 units/hr  - Monitor Heparin level and CBC daily while on heparin - monitor for s/sx bleeding - Please advise if/when heparin drip needs to be held for procedure on 06/17/21  Dia Sitter, PharmD,  BCPS 06/13/2021 7:36 AM

## 2021-06-14 DIAGNOSIS — S32019A Unspecified fracture of first lumbar vertebra, initial encounter for closed fracture: Secondary | ICD-10-CM | POA: Diagnosis not present

## 2021-06-14 DIAGNOSIS — I10 Essential (primary) hypertension: Secondary | ICD-10-CM | POA: Diagnosis not present

## 2021-06-14 DIAGNOSIS — N1832 Chronic kidney disease, stage 3b: Secondary | ICD-10-CM | POA: Diagnosis not present

## 2021-06-14 DIAGNOSIS — M545 Low back pain, unspecified: Secondary | ICD-10-CM | POA: Diagnosis not present

## 2021-06-14 LAB — CBC
HCT: 37.4 % (ref 36.0–46.0)
Hemoglobin: 11 g/dL — ABNORMAL LOW (ref 12.0–15.0)
MCH: 27.2 pg (ref 26.0–34.0)
MCHC: 29.4 g/dL — ABNORMAL LOW (ref 30.0–36.0)
MCV: 92.6 fL (ref 80.0–100.0)
Platelets: 273 10*3/uL (ref 150–400)
RBC: 4.04 MIL/uL (ref 3.87–5.11)
RDW: 14.8 % (ref 11.5–15.5)
WBC: 7.3 10*3/uL (ref 4.0–10.5)
nRBC: 0 % (ref 0.0–0.2)

## 2021-06-14 LAB — GLUCOSE, CAPILLARY
Glucose-Capillary: 90 mg/dL (ref 70–99)
Glucose-Capillary: 167 mg/dL — ABNORMAL HIGH (ref 70–99)
Glucose-Capillary: 196 mg/dL — ABNORMAL HIGH (ref 70–99)
Glucose-Capillary: 154 mg/dL — ABNORMAL HIGH (ref 70–99)

## 2021-06-14 LAB — HEPARIN LEVEL (UNFRACTIONATED): Heparin Unfractionated: 0.43 IU/mL (ref 0.30–0.70)

## 2021-06-14 NOTE — Progress Notes (Addendum)
PROGRESS NOTE    [name]  VQM:[id number] DOB: 12/23/1940 DOA: 06/06/2021 PCP: Willey Blade, MD   Brief Narrative:  [name] is a 81 y.o. female with medical history of diabetes type 2, hypertension, rheumatoid arthritis, breast and uterine cancer, chronic back pain, hyperlipidemia presented to the hospital with low back pain.  Patient is pretty much wheelchair-bound for the last 3 years and is currently at assisted living facility.  Patient was trying to reach out to the therapist when she felt a pop in her lower back and has been having pain since then.  MRI was obtained in the ED which showed the L1 inferior endplate fracture.  ED provider spoke with neurosurgery Dr. Zada Finders who recommended TSL0 versus and no surgical intervention.  In the ED, patient was also noted to have hyperkalemia and was on spironolactone as outpatient with a stage IIIb. At this time, patient is being managed with pain medication, TSLO brace but continues to have severe pain and had a lot of confusion and disorientation with narcotics.  IR has been consulted for possible kyphoplasty.  Assessment & Plan:  Closed L1 vertebral fracture:   -Neurosurgery recommended TSLO brace with pain management but patient developed delirium and confusion with narcotics.  -Due to severe back pain she was not able to undergo kyphoplasty on 06/12/2021  -Plan is for kyphoplasty under anesthesia on 06/17/2021 by IR  -Continue as needed pain medications  low-grade fever: -Resolved.   -No leukocytosis, lactic acid: WNL.  Chest x-ray showed some low lung volumes with basilar opacity and interstitial opacities in the left lung.  Received 3-day course of Rocephin and Zithromax for possible pneumonia.   -Antibiotics discontinued.  COVID-19 negative.  Acute metabolic encephalopathy: Resolved -Patient is alert and oriented now   Hyperkalemia. -Resolved with Lokelma   Diabetes mellitus type 2.  Continue sliding scale  insulin Accu-Cheks.     CKD stage IIIb .  Chronic.  Latest creatinine of 1.3.   -Continue to monitor.   Essential hypertension stable - continue metoprolol.  Spironolactone on hold due to hyperkalemia.    Right buttocks stage II pressure injury present on admission.  Continue wound care.  GERD: cont. Pepcid  Neuropathy/chronic back pain: cont. Gabapentin and cymbalta   History of VTE/saddle pulmonary embolism.  Eliquis on hold since 06/08/2021.   on heparin drip, will need to transition to Eliquis on discharge. -cont. Heparin-anticipating procedure on 06/17/21  Hx of left breast cancer: Followed by oncology outpatient -cont. Aromasin  Depression/memory issues?: -cont. Home meds-welbutrin and aricept  Normocytic anemia: H & H stable   DVT prophylaxis: Heparin Code Status: Full code Family Communication:  None present at bedside.  Plan of care discussed with patient in length and he verbalized understanding and agreed with it.  I called patient's son and updated him.  He is concerned about patient's dehydration/poor p.o. intake.  Disposition Plan: To be determined  Consultants:  Neurosurgery IR  Procedures:  None  Antimicrobials:  None  Status is: Inpatient    Subjective: Patient seen and examined.  Resting comfortably on the bed.  Tells me that she is hungry and would like to eat breakfast.  Back pain is better, no fever, urinating well, regular BM, no acute events overnight.  Objective: Vitals:   06/13/21 0841 06/13/21 1348 06/13/21 2052 06/14/21 0458  BP: 118/64 (!) 150/57 135/60 (!) 128/57  Pulse: 84 80 82 80  Resp: (!) 22 (!) 23 20 20   Temp: 98.2 F (36.8 C)  98 F (36.7 C) 98.1 F (36.7 C) 98.3 F (36.8 C)  TempSrc: Oral Oral Oral Oral  SpO2: 97% 92% 96% 94%  Weight:      Height:       No intake or output data in the 24 hours ending 06/14/21 0830  Filed Weights   06/06/21 1852  Weight: 136.1 kg    Examination:  General exam: Appears calm  and comfortable, obese, on [place] Respiratory system: Clear to auscultation. Respiratory effort normal. Cardiovascular system: S1 & S2 heard, RRR. No JVD, murmurs, rubs, gallops or clicks. No pedal edema. Gastrointestinal system: Abdomen is nondistended, soft and nontender. No organomegaly or masses felt. Normal bowel sounds heard. Central nervous system: Alert and oriented.  Answers appropriately. Skin: No rashes, lesions or ulcers   Data Reviewed: I have personally reviewed following labs and imaging studies  CBC: Recent Labs  Lab 06/10/21 0502 06/11/21 0444 06/12/21 0459 06/13/21 0539 06/14/21 0538  WBC 6.1 6.9 6.7 7.3 7.3  HGB 11.1* 11.9* 11.2* 11.3* 11.0*  HCT 38.8 40.6 38.1 38.7 37.4  MCV 95.1 93.5 92.7 92.6 92.6  PLT 307 315 305 300 790    Basic Metabolic Panel: Recent Labs  Lab 06/08/21 0518 06/09/21 0503 06/10/21 0502 06/12/21 0459 06/13/21 0539  NA 137 137 136 136 138  K 5.5* 4.6 4.6 4.2 4.1  CL 103 103 102 100 99  CO2 26 26 27 29 30   GLUCOSE 146* 124* 154* 110* 98  BUN 27* 29* 18 16 15   CREATININE 1.75* 1.69* 1.34* 1.26* 1.33*  CALCIUM 9.5 9.1 9.1 9.7 9.8  MG 2.0 2.0 2.0 2.2  --     GFR: Estimated Creatinine Clearance: 48.8 mL/min (A) (by C-G formula based on SCr of 1.33 mg/dL (H)). Liver Function Tests: No results for input(s): AST, ALT, ALKPHOS, BILITOT, PROT, ALBUMIN in the last 168 hours.  No results for input(s): LIPASE, AMYLASE in the last 168 hours. No results for input(s): AMMONIA in the last 168 hours. Coagulation Profile: Recent Labs  Lab 06/12/21 0459  INR 1.1    Cardiac Enzymes: No results for input(s): CKTOTAL, CKMB, CKMBINDEX, TROPONINI in the last 168 hours. BNP (last 3 results) No results for input(s): PROBNP in the last 8760 hours. HbA1C: No results for input(s): HGBA1C in the last 72 hours. CBG: Recent Labs  Lab 06/13/21 0743 06/13/21 1223 06/13/21 1614 06/13/21 2048 06/14/21 0812  GLUCAP 112* 152* 141* 110* 90     Lipid Profile: No results for input(s): CHOL, HDL, LDLCALC, TRIG, CHOLHDL, LDLDIRECT in the last 72 hours. Thyroid Function Tests: No results for input(s): TSH, T4TOTAL, FREET4, T3FREE, THYROIDAB in the last 72 hours. Anemia Panel: No results for input(s): VITAMINB12, FOLATE, FERRITIN, TIBC, IRON, RETICCTPCT in the last 72 hours. Sepsis Labs: No results for input(s): PROCALCITON, LATICACIDVEN in the last 168 hours.   Recent Results (from the past 240 hour(s))  Resp Panel by RT-PCR (Flu A&B, Covid) Nasopharyngeal Swab     Status: None   Collection Time: 06/06/21  8:02 PM   Specimen: Nasopharyngeal Swab; Nasopharyngeal(NP) swabs in vial transport medium  Result Value Ref Range Status   SARS Coronavirus 2 by RT PCR NEGATIVE NEGATIVE Final    Comment: (NOTE) SARS-CoV-2 target nucleic acids are NOT DETECTED.  The SARS-CoV-2 RNA is generally detectable in upper respiratory specimens during the acute phase of infection. The lowest concentration of SARS-CoV-2 viral copies this assay can detect is 138 copies/mL. A negative result does not preclude SARS-Cov-2 infection and should  not be used as the sole basis for treatment or other patient management decisions. A negative result may occur with  improper specimen collection/handling, submission of specimen other than nasopharyngeal swab, presence of viral mutation(s) within the areas targeted by this assay, and inadequate number of viral copies(<138 copies/mL). A negative result must be combined with clinical observations, patient history, and epidemiological information. The expected result is Negative.  Fact Sheet for Patients:  EntrepreneurPulse.com.au  Fact Sheet for Healthcare Providers:  IncredibleEmployment.be  This test is no t yet approved or cleared by the [place] FDA and  has been authorized for detection and/or diagnosis of SARS-CoV-2 by FDA under an Emergency Use Authorization  (EUA). This EUA will remain  in effect (meaning this test can be used) for the duration of the COVID-19 declaration under Section 564(b)(1) of the Act, 21 U.S.C.section 360bbb-3(b)(1), unless the authorization is terminated  or revoked sooner.       Influenza A by PCR NEGATIVE NEGATIVE Final   Influenza B by PCR NEGATIVE NEGATIVE Final    Comment: (NOTE) The Xpert Xpress SARS-CoV-2/FLU/RSV plus assay is intended as an aid in the diagnosis of influenza from Nasopharyngeal swab specimens and should not be used as a sole basis for treatment. Nasal washings and aspirates are unacceptable for Xpert Xpress SARS-CoV-2/FLU/RSV testing.  Fact Sheet for Patients: EntrepreneurPulse.com.au  Fact Sheet for Healthcare Providers: IncredibleEmployment.be  This test is not yet approved or cleared by the [place] FDA and has been authorized for detection and/or diagnosis of SARS-CoV-2 by FDA under an Emergency Use Authorization (EUA). This EUA will remain in effect (meaning this test can be used) for the duration of the COVID-19 declaration under Section 564(b)(1) of the Act, 21 U.S.C. section 360bbb-3(b)(1), unless the authorization is terminated or revoked.  Performed at [facility], [place] [address], [place], [place] [address]   Surgical PCR screen     Status: None   Collection Time: 06/11/21 11:09 PM   Specimen: Nasal Mucosa; Nasal Swab  Result Value Ref Range Status   MRSA, PCR NEGATIVE NEGATIVE Final   Staphylococcus aureus NEGATIVE NEGATIVE Final    Comment: (NOTE) The Xpert SA Assay (FDA approved for NASAL specimens in patients 25 years of age and older), is one component of a comprehensive surveillance program. It is not intended to diagnose infection nor to guide or monitor treatment. Performed at [facility], [place] [address] [place], [place] [address]        Radiology Studies: No results  found.  Scheduled Meds:  acidophilus  1 capsule Oral Daily   vitamin C  250 mg Oral BID   benzonatate  200 mg Oral TID   bisacodyl  10 mg Rectal Daily   buPROPion  150 mg Oral BID   docusate sodium  100 mg Oral Daily   donepezil  5 mg Oral Daily   DULoxetine  20 mg Oral Daily   exemestane  25 mg Oral Daily   famotidine  20 mg Oral BID   gabapentin  200 mg Oral BID   insulin aspart  0-15 Units Subcutaneous TID WC & HS   linaclotide  72 mcg Oral q morning   metoprolol succinate  50 mg Oral BID   multivitamin with minerals  1 tablet Oral Daily   polyethylene glycol  17 g Oral Daily   Ensure Max Protein  11 oz Oral BID   senna-docusate  1 tablet Oral BID   zinc sulfate  220 mg  Oral Daily   Continuous Infusions:   ceFAZolin (ANCEF) IV     heparin 1,300 Units/hr (06/13/21 1452)     LOS: 7 days   Time spent: 35 minutes   Callan Norden Loann Quill, MD [facility]  If 7PM-7AM, please contact night-coverage [email or web address] 06/14/2021, 8:30 AM

## 2021-06-14 NOTE — Progress Notes (Signed)
[place] for Eliquis >> heparin drip Indication: history of saddle PE  Allergies  Allergen Reactions   Iodine Rash    Patient Measurements: Height: 5\' 5"  (165.1 cm) Weight: 136.1 kg (300 lb) IBW/kg (Calculated) : 57 Heparin Dosing Weight: 90.7 kg  Vital Signs: Temp: 98.3 F (36.8 C) (01/22 0458) Temp Source: Oral (01/22 0458) BP: 128/57 (01/22 0458) Pulse Rate: 80 (01/22 0458)  Labs: Recent Labs    06/12/21 0459 06/12/21 2339 06/13/21 0539 06/14/21 0538  HGB 11.2*  --  11.3* 11.0*  HCT 38.1  --  38.7 37.4  PLT 305  --  300 273  LABPROT 13.7  --   --   --   INR 1.1  --   --   --   HEPARINUNFRC 0.35 0.42 0.52 0.43  CREATININE 1.26*  --  1.33*  --      Estimated Creatinine Clearance: 48.8 mL/min (A) (by C-G formula based on SCr of 1.33 mg/dL (H)).  Assessment: 81 year old female with notable PMH of saddle PE (2017) on Eliquis 5mg  PO BID presents with back pain after working with therapy.  MRI in the ED obtained which showed L1 inferior endplate fracture with no cord involvement.  NSGY advised for TLSO brace to be placed and no surgical intervention needed.  TRH discussing with IR for the possibility of kyphoplasty.  Eliquis has been held - last dose 1/16 @ 1059.  Pharmacy has been consulted to dose heparin drip while Eliquis is on hold.  1/20: Heparin held for kyphoplasty which was cancelled and rescheduled for 1/25.  Heparin resumed at previously therapeutic rate.   Today, 06/14/2021: - Heparin level remains therapeutic at 0.43 with drip running at 1300 units/hr - Hgb stable, plts wnl - No bleeding documented  Goal of Therapy:  Monitor platelets by anticoagulation protocol: Yes   Plan:  - Continue heparin infusion at 1300 units/hr  - daily cbc and heparin level - monitor for s/sx bleeding - Please advise if/when heparin drip needs to be held for procedure on 06/17/21  Dia Sitter, PharmD, BCPS 06/14/2021 8:06 AM

## 2021-06-15 DIAGNOSIS — S32019A Unspecified fracture of first lumbar vertebra, initial encounter for closed fracture: Secondary | ICD-10-CM | POA: Diagnosis not present

## 2021-06-15 DIAGNOSIS — E1169 Type 2 diabetes mellitus with other specified complication: Secondary | ICD-10-CM | POA: Diagnosis not present

## 2021-06-15 DIAGNOSIS — M545 Low back pain, unspecified: Secondary | ICD-10-CM | POA: Diagnosis not present

## 2021-06-15 DIAGNOSIS — N1832 Chronic kidney disease, stage 3b: Secondary | ICD-10-CM | POA: Diagnosis not present

## 2021-06-15 LAB — GLUCOSE, CAPILLARY
Glucose-Capillary: 142 mg/dL — ABNORMAL HIGH (ref 70–99)
Glucose-Capillary: 157 mg/dL — ABNORMAL HIGH (ref 70–99)
Glucose-Capillary: 153 mg/dL — ABNORMAL HIGH (ref 70–99)
Glucose-Capillary: 165 mg/dL — ABNORMAL HIGH (ref 70–99)

## 2021-06-15 LAB — CBC
HCT: 37.2 % (ref 36.0–46.0)
Hemoglobin: 10.8 g/dL — ABNORMAL LOW (ref 12.0–15.0)
MCH: 26.9 pg (ref 26.0–34.0)
MCHC: 29 g/dL — ABNORMAL LOW (ref 30.0–36.0)
MCV: 92.8 fL (ref 80.0–100.0)
Platelets: 265 10*3/uL (ref 150–400)
RBC: 4.01 MIL/uL (ref 3.87–5.11)
RDW: 15.1 % (ref 11.5–15.5)
WBC: 8.4 10*3/uL (ref 4.0–10.5)
nRBC: 0 % (ref 0.0–0.2)

## 2021-06-15 LAB — HEPARIN LEVEL (UNFRACTIONATED): Heparin Unfractionated: 0.59 IU/mL (ref 0.30–0.70)

## 2021-06-15 MED ORDER — LIVING WELL WITH DIABETES BOOK
Freq: Once | Status: AC
  Filled 2021-06-15: qty 1

## 2021-06-15 NOTE — Progress Notes (Signed)
PROGRESS NOTE    [name]  BJS:[id number] DOB: 02-Sep-1940 DOA: 06/06/2021 PCP: Willey Blade, MD   Brief Narrative:   [name] is a 81 y.o. female with medical history of diabetes type 2, hypertension, rheumatoid arthritis, breast and uterine cancer, chronic back pain, hyperlipidemia presented to the hospital with low back pain.  Patient is pretty much wheelchair-bound for the last 3 years and is currently at assisted living facility.  Patient was trying to reach out to the therapist when she felt a pop in her lower back and has been having pain since then.  MRI was obtained in the ED which showed the L1 inferior endplate fracture.  ED provider spoke with neurosurgery Dr. Zada Finders who recommended TSL0 versus and no surgical intervention.  In the ED, patient was also noted to have hyperkalemia and was on spironolactone as outpatient with a stage IIIb. At this time, IR has been consulted for possible kyphoplasty but kyphoplasty could not be done on 06/12/2021 and plan is kyphoplasty under sedation on 06/17/21.  Assessment & Plan:  Closed L1 vertebral fracture:   Neurosurgery initially recommended TSLO brace for pain management but developed delirium with confusion with narcotics.  Currently awaiting for kyphoplasty and the plan is to kyphoplasty under anesthesia on 06/17/2021 since she was unable to undergo kyphoplasty on 06/12/2021.    Low-grade fever.   Resolved.  COVID and influenza was negative.  Lactate was within normal limits.  No leukocytosis.   Urinalysis does not show any obvious signs of infection.  Chest x-ray showed some low lung volumes with basilar opacity and interstitial opacities in the left lung.  Received 3-day course of Rocephin and Zithromax for possible pneumonia.  Acute metabolic encephalopathy:  Resolved at this time.    Hyperkalemia. Latest potassium of 4.1.  Recently received Lokelma.  Diabetes mellitus type 2.   Controlled.  Continue sliding scale  insulin Accu-Cheks.  Latest POC glucose of 142   CKD stage IIIb .   Latest creatinine of 1.3.   Essential hypertension  Continue metoprolol.  Spironolactone on hold due to hyperkalemia   Right buttocks stage II pressure injury  present on admission.  Continue wound care.  GERD:  Continue Pepcid  Neuropathy/chronic back pain:  Continue Gabapentin and cymbalta   History of VTE/saddle pulmonary embolism.  Continue heparin anticipating a procedure on 06/17/2021   Hx of left breast cancer: Continue Aromasin.  Follows up with oncology as outpatient.  Depression/memory issues Continue welbutrin and aricept  Normocytic anemia:  Latest hemoglobin of 10.8.   DVT prophylaxis: Heparin drip  Code Status: Full code  Family Communication:   None today.  Disposition Plan: Uncertain at this time.  Consultants:  Neurosurgery IR  Procedures:  None  Antimicrobials:  None    Subjective: Today, patient was seen and examined at bedside.  Patient complains of mild back pain.  Objective: Vitals:   06/14/21 0458 06/14/21 1436 06/14/21 2158 06/15/21 0638  BP: (!) 128/57 (!) 160/59 (!) 138/55 133/62  Pulse: 80 83 82 78  Resp: 20 20 18 18   Temp: 98.3 F (36.8 C) 97.8 F (36.6 C) 98.3 F (36.8 C) 97.9 F (36.6 C)  TempSrc: Oral Oral Oral Oral  SpO2: 94% 98% 95% 97%  Weight:      Height:        Intake/Output Summary (Last 24 hours) at 06/15/2021 0743 Last data filed at 06/15/2021 6073 Gross per 24 hour  Intake 687.5 ml  Output 475 ml  Net  212.5 ml    Filed Weights   06/06/21 1852  Weight: 136.1 kg    Physical examination: General: Morbidly obese built, not in obvious distress HENT:   No scleral pallor or icterus noted. Oral mucosa is moist.  Chest:  Diminished breath sounds bilaterally. No crackles or wheezes.  CVS: S1 &S2 heard. No murmur.  Regular rate and rhythm. Abdomen: Soft, nontender, nondistended.  Bowel sounds are heard.   Extremities: No cyanosis,  clubbing or edema.  Peripheral pulses are palpable. Psych: Alert, awake and communicative, CNS:  No cranial nerve deficits.  Power equal in all extremities.   Skin: Warm and dry.  No rashes noted.   Data Reviewed: I have personally reviewed following labs and imaging studies  CBC: Recent Labs  Lab 06/11/21 0444 06/12/21 0459 06/13/21 0539 06/14/21 0538 06/15/21 0528  WBC 6.9 6.7 7.3 7.3 8.4  HGB 11.9* 11.2* 11.3* 11.0* 10.8*  HCT 40.6 38.1 38.7 37.4 37.2  MCV 93.5 92.7 92.6 92.6 92.8  PLT 315 305 300 273 950    Basic Metabolic Panel: Recent Labs  Lab 06/09/21 0503 06/10/21 0502 06/12/21 0459 06/13/21 0539  NA 137 136 136 138  K 4.6 4.6 4.2 4.1  CL 103 102 100 99  CO2 26 27 29 30   GLUCOSE 124* 154* 110* 98  BUN 29* 18 16 15   CREATININE 1.69* 1.34* 1.26* 1.33*  CALCIUM 9.1 9.1 9.7 9.8  MG 2.0 2.0 2.2  --     GFR: Estimated Creatinine Clearance: 48.8 mL/min (A) (by C-G formula based on SCr of 1.33 mg/dL (H)). Liver Function Tests: No results for input(s): AST, ALT, ALKPHOS, BILITOT, PROT, ALBUMIN in the last 168 hours.  No results for input(s): LIPASE, AMYLASE in the last 168 hours. No results for input(s): AMMONIA in the last 168 hours. Coagulation Profile: Recent Labs  Lab 06/12/21 0459  INR 1.1    Cardiac Enzymes: No results for input(s): CKTOTAL, CKMB, CKMBINDEX, TROPONINI in the last 168 hours. BNP (last 3 results) No results for input(s): PROBNP in the last 8760 hours. HbA1C: No results for input(s): HGBA1C in the last 72 hours. CBG: Recent Labs  Lab 06/13/21 2048 06/14/21 0812 06/14/21 1236 06/14/21 1701 06/14/21 2150  GLUCAP 110* 90 167* 196* 154*    Lipid Profile: No results for input(s): CHOL, HDL, LDLCALC, TRIG, CHOLHDL, LDLDIRECT in the last 72 hours. Thyroid Function Tests: No results for input(s): TSH, T4TOTAL, FREET4, T3FREE, THYROIDAB in the last 72 hours. Anemia Panel: No results for input(s): VITAMINB12, FOLATE, FERRITIN,  TIBC, IRON, RETICCTPCT in the last 72 hours. Sepsis Labs: No results for input(s): PROCALCITON, LATICACIDVEN in the last 168 hours.   Recent Results (from the past 240 hour(s))  Resp Panel by RT-PCR (Flu A&B, Covid) Nasopharyngeal Swab     Status: None   Collection Time: 06/06/21  8:02 PM   Specimen: Nasopharyngeal Swab; Nasopharyngeal(NP) swabs in vial transport medium  Result Value Ref Range Status   SARS Coronavirus 2 by RT PCR NEGATIVE NEGATIVE Final    Comment: (NOTE) SARS-CoV-2 target nucleic acids are NOT DETECTED.  The SARS-CoV-2 RNA is generally detectable in upper respiratory specimens during the acute phase of infection. The lowest concentration of SARS-CoV-2 viral copies this assay can detect is 138 copies/mL. A negative result does not preclude SARS-Cov-2 infection and should not be used as the sole basis for treatment or other patient management decisions. A negative result may occur with  improper specimen collection/handling, submission of specimen other than nasopharyngeal  swab, presence of viral mutation(s) within the areas targeted by this assay, and inadequate number of viral copies(<138 copies/mL). A negative result must be combined with clinical observations, patient history, and epidemiological information. The expected result is Negative.  Fact Sheet for Patients:  EntrepreneurPulse.com.au  Fact Sheet for Healthcare Providers:  IncredibleEmployment.be  This test is no t yet approved or cleared by the [place] FDA and  has been authorized for detection and/or diagnosis of SARS-CoV-2 by FDA under an Emergency Use Authorization (EUA). This EUA will remain  in effect (meaning this test can be used) for the duration of the COVID-19 declaration under Section 564(b)(1) of the Act, 21 U.S.C.section 360bbb-3(b)(1), unless the authorization is terminated  or revoked sooner.       Influenza A by PCR NEGATIVE NEGATIVE  Final   Influenza B by PCR NEGATIVE NEGATIVE Final    Comment: (NOTE) The Xpert Xpress SARS-CoV-2/FLU/RSV plus assay is intended as an aid in the diagnosis of influenza from Nasopharyngeal swab specimens and should not be used as a sole basis for treatment. Nasal washings and aspirates are unacceptable for Xpert Xpress SARS-CoV-2/FLU/RSV testing.  Fact Sheet for Patients: EntrepreneurPulse.com.au  Fact Sheet for Healthcare Providers: IncredibleEmployment.be  This test is not yet approved or cleared by the [place] FDA and has been authorized for detection and/or diagnosis of SARS-CoV-2 by FDA under an Emergency Use Authorization (EUA). This EUA will remain in effect (meaning this test can be used) for the duration of the COVID-19 declaration under Section 564(b)(1) of the Act, 21 U.S.C. section 360bbb-3(b)(1), unless the authorization is terminated or revoked.  Performed at [facility], [place] [address] [place], [place] [address]   Surgical PCR screen     Status: None   Collection Time: 06/11/21 11:09 PM   Specimen: Nasal Mucosa; Nasal Swab  Result Value Ref Range Status   MRSA, PCR NEGATIVE NEGATIVE Final   Staphylococcus aureus NEGATIVE NEGATIVE Final    Comment: (NOTE) The Xpert SA Assay (FDA approved for NASAL specimens in patients 67 years of age and older), is one component of a comprehensive surveillance program. It is not intended to diagnose infection nor to guide or monitor treatment. Performed at [facility], [place] [address], [place],  [address]        Radiology Studies: No results found.  Scheduled Meds:  acidophilus  1 capsule Oral Daily   vitamin C  250 mg Oral BID   benzonatate  200 mg Oral TID   bisacodyl  10 mg Rectal Daily   buPROPion  150 mg Oral BID   docusate sodium  100 mg Oral Daily   donepezil  5 mg Oral Daily   DULoxetine  20 mg Oral Daily   exemestane   25 mg Oral Daily   famotidine  20 mg Oral BID   gabapentin  200 mg Oral BID   insulin aspart  0-15 Units Subcutaneous TID WC & HS   linaclotide  72 mcg Oral q morning   metoprolol succinate  50 mg Oral BID   multivitamin with minerals  1 tablet Oral Daily   polyethylene glycol  17 g Oral Daily   Ensure Max Protein  11 oz Oral BID   senna-docusate  1 tablet Oral BID   zinc sulfate  220 mg Oral Daily   Continuous Infusions:   ceFAZolin (ANCEF) IV     heparin 1,300 Units/hr (06/15/21 0539)     LOS: 8 days  Flora Lipps, MD [facility] If 7PM-7AM, please contact night-coverage [email or web address] 06/15/2021, 7:43 AM

## 2021-06-15 NOTE — Care Management Important Message (Signed)
Important Message  Patient Details IM Letter given to the Patient. Name: [name] MRN: [id number] Date of Birth: 10/29/1940   Medicare Important Message Given:  Yes     [name] 06/15/2021, 10:06 AM

## 2021-06-15 NOTE — Progress Notes (Signed)
[place] for Eliquis >> heparin drip Indication: history of saddle PE  Allergies  Allergen Reactions   Iodine Rash    Patient Measurements: Height: 5\' 5"  (165.1 cm) Weight: 136.1 kg (300 lb) IBW/kg (Calculated) : 57 Heparin Dosing Weight: 90.7 kg  Vital Signs: Temp: 97.9 F (36.6 C) (01/23 0638) Temp Source: Oral (01/23 2800) BP: 133/62 (01/23 3491) Pulse Rate: 78 (01/23 0638)  Labs: Recent Labs    06/13/21 0539 06/14/21 0538 06/15/21 0528  HGB 11.3* 11.0* 10.8*  HCT 38.7 37.4 37.2  PLT 300 273 265  HEPARINUNFRC 0.52 0.43 0.59  CREATININE 1.33*  --   --      Estimated Creatinine Clearance: 48.8 mL/min (A) (by C-G formula based on SCr of 1.33 mg/dL (H)).  Assessment: 81 year old female with notable PMH of saddle PE (2017) on Eliquis 5mg  PO BID presents with back pain after working with therapy.  MRI in the ED obtained which showed L1 inferior endplate fracture with no cord involvement.  NSGY advised for TLSO brace to be placed and no surgical intervention needed.  TRH discussing with IR for the possibility of kyphoplasty.  Eliquis has been held - last dose 1/16 @ 1059.  Pharmacy has been consulted to dose heparin drip while Eliquis is on hold.  1/20: Heparin held for kyphoplasty which was cancelled and rescheduled for 1/25.  Heparin resumed at previously therapeutic rate.   Today, 06/15/2021: - Heparin level remains therapeutic with heparin drip running at 1300 units/hr - Hgb stable, plts wnl - No bleeding documented  Goal of Therapy:  Monitor platelets by anticoagulation protocol: Yes   Plan:  - Continue heparin infusion at 1300 units/hr  - daily CBC and heparin level - monitor for s/sx bleeding - Please advise if/when heparin drip needs to be held for procedure on 06/17/21   Adrian Saran, PharmD, BCPS Secure Chat if ?s 06/15/2021 8:30 AM

## 2021-06-16 ENCOUNTER — Inpatient Hospital Stay (HOSPITAL_COMMUNITY): Payer: Medicare Other

## 2021-06-16 DIAGNOSIS — M545 Low back pain, unspecified: Secondary | ICD-10-CM | POA: Diagnosis not present

## 2021-06-16 DIAGNOSIS — E1169 Type 2 diabetes mellitus with other specified complication: Secondary | ICD-10-CM | POA: Diagnosis not present

## 2021-06-16 DIAGNOSIS — N1832 Chronic kidney disease, stage 3b: Secondary | ICD-10-CM | POA: Diagnosis not present

## 2021-06-16 DIAGNOSIS — S32019A Unspecified fracture of first lumbar vertebra, initial encounter for closed fracture: Secondary | ICD-10-CM | POA: Diagnosis not present

## 2021-06-16 LAB — GLUCOSE, CAPILLARY
Glucose-Capillary: 151 mg/dL — ABNORMAL HIGH (ref 70–99)
Glucose-Capillary: 203 mg/dL — ABNORMAL HIGH (ref 70–99)
Glucose-Capillary: 184 mg/dL — ABNORMAL HIGH (ref 70–99)
Glucose-Capillary: 110 mg/dL — ABNORMAL HIGH (ref 70–99)

## 2021-06-16 LAB — HEPARIN LEVEL (UNFRACTIONATED): Heparin Unfractionated: 0.63 IU/mL (ref 0.30–0.70)

## 2021-06-16 MED ORDER — CEFAZOLIN IN SODIUM CHLORIDE 3-0.9 GM/100ML-% IV SOLN
3.0000 g | INTRAVENOUS | Status: AC
  Administered 2021-06-17: 13:00:00 3 g via INTRAVENOUS
  Filled 2021-06-16 (×2): qty 100

## 2021-06-16 NOTE — Progress Notes (Signed)
Inpatient Diabetes Program Recommendations  AACE/ADA: New Consensus Statement on Inpatient Glycemic Control (2015)  Target Ranges:  Prepandial:   less than 140 mg/dL      Peak postprandial:   less than 180 mg/dL (1-2 hours)      Critically ill patients:  140 - 180 mg/dL   Lab Results  Component Value Date   GLUCAP 203 (H) 06/16/2021   HGBA1C 7.2 (H) 06/07/2021    Received referral for diabetes education.  Lorenda Peck, RD, CDE spoke with patient on 06/08/21 regarding diabetes and education.    Will continue to follow while inpatient.  Thank you, Reche Dixon, MSN, RN Diabetes Coordinator Inpatient Diabetes Program [phone] (team pager from 8a-5p)

## 2021-06-16 NOTE — Plan of Care (Signed)
  Problem: Education: Goal: Knowledge of General Education information will improve Description: Including pain rating scale, medication(s)/side effects and non-pharmacologic comfort measures Outcome: Progressing   Problem: Clinical Measurements: Goal: Respiratory complications will improve Outcome: Progressing   

## 2021-06-16 NOTE — Progress Notes (Signed)
[place] for Eliquis >> heparin drip Indication: history of saddle PE  Allergies  Allergen Reactions   Iodine Rash    Patient Measurements: Height: 5\' 5"  (165.1 cm) Weight: 136.1 kg (300 lb) IBW/kg (Calculated) : 57 Heparin Dosing Weight: 90.7 kg  Vital Signs: Temp: 98.2 F (36.8 C) (01/24 0515) Temp Source: Oral (01/24 0515) BP: 130/57 (01/24 0515) Pulse Rate: 79 (01/24 0515)  Labs: Recent Labs    06/14/21 0538 06/15/21 0528 06/16/21 0550  HGB 11.0* 10.8*  --   HCT 37.4 37.2  --   PLT 273 265  --   HEPARINUNFRC 0.43 0.59 0.63     Estimated Creatinine Clearance: 48.8 mL/min (A) (by C-G formula based on SCr of 1.33 mg/dL (H)).  Assessment: 81 year old female with notable PMH of saddle PE (2017) on Eliquis 5mg  PO BID presents with back pain after working with therapy.  MRI in the ED obtained which showed L1 inferior endplate fracture with no cord involvement.  NSGY advised for TLSO brace to be placed and no surgical intervention needed.  TRH discussing with IR for the possibility of kyphoplasty.  Eliquis has been held - last dose 1/16 @ 1059.  Pharmacy has been consulted to dose heparin drip while Eliquis is on hold.  1/20: Heparin held for kyphoplasty which was cancelled and rescheduled for 1/25.  Heparin resumed at previously therapeutic rate.   Today, 06/16/2021: - Heparin level remains therapeutic with heparin drip running at 1300 units/hr - Hgb stable, plts wnl - No bleeding documented  Goal of Therapy:  Monitor platelets by anticoagulation protocol: Yes   Plan:  - Continue heparin infusion at 1300 units/hr  - daily CBC and heparin level - monitor for s/sx bleeding - Please advise if/when heparin drip needs to be held for procedure on 06/17/21  Royetta Asal, PharmD, [place] Please utilize Amion for appropriate phone number to reach the unit pharmacist ([place]) 06/16/2021 7:42  AM

## 2021-06-16 NOTE — Progress Notes (Signed)
PROGRESS NOTE    [name]  OQH:[id number] DOB: November 28, 1940 DOA: 06/06/2021 PCP: Willey Blade, MD   Brief Narrative:   [name] is a 81 y.o. female with medical history of diabetes type 2, hypertension, rheumatoid arthritis, breast and uterine cancer, chronic back pain, hyperlipidemia presented to the hospital with low back pain.  Patient is pretty much wheelchair-bound for the last 3 years and is currently at assisted living facility.  Patient was trying to reach out to the therapist when she felt a pop in her lower back and has been having pain since then.  MRI was obtained in the ED which showed the L1 inferior endplate fracture.  ED provider spoke with neurosurgery Dr. Zada Finders who recommended TSL0 versus and no surgical intervention.  In the ED, patient was also noted to have hyperkalemia and was on spironolactone as outpatient with a stage IIIb. At this time, IR has been consulted for possible kyphoplasty but kyphoplasty could not be done on 06/12/2021 and plan is kyphoplasty under sedation on 06/17/21.  Assessment & Plan:  Closed L1 vertebral fracture:   Neurosurgery initially recommended TSLO brace for pain management but developed delirium with confusion with narcotics.  Currently awaiting for kyphoplasty and the plan is to kyphoplasty under anesthesia on 06/17/2021 since she was unable to undergo kyphoplasty on 06/12/2021.    Low-grade fever.   Resolved.  COVID and influenza was negative.  Lactate was within normal limits.  No leukocytosis.   Urinalysis does not show any obvious signs of infection.  Chest x-ray showed some low lung volumes with basilar opacity and interstitial opacities in the left lung.  Received 3-day course of Rocephin and Zithromax for possible pneumonia.  Acute metabolic encephalopathy:  Improved but occasionally confused.  We will discontinue gabapentin as well for now.  Been taken off Dilaudid IV.  Hyperkalemia. Latest potassium of 4.1.   Recently received Lokelma.  Will check BMP in AM.  Diabetes mellitus type 2.   Controlled.  Continue sliding scale insulin Accu-Cheks.  Latest POC glucose of 151   CKD stage IIIb .   Latest creatinine of 1.3.  Check BMP in AM.   Essential hypertension  Continue metoprolol.  Spironolactone on hold due to hyperkalemia   Right buttocks stage II pressure injury  present on admission.  Continue wound care.  GERD:  Continue Pepcid  Neuropathy/chronic back pain:  On gabapentin and cymbalta.  Gabapentin dose   History of VTE/saddle pulmonary embolism.  Continue heparin drip anticipating a procedure on 06/17/2021   Hx of left breast cancer: Continue Aromasin.  Follows up with oncology as outpatient.  Depression/memory issues Continue welbutrin and aricept  Normocytic anemia:  Latest hemoglobin of 10.8.  DVT prophylaxis: Heparin drip  Code Status: Full code  Family Communication:   I spoke with the patient's son on the phone and updated him about the clinical condition of the patient.  Disposition Plan: Uncertain at this time. Awaiting for IR intervention.  Consultants:  Neurosurgery IR  Procedures:  None  Antimicrobials:  None   Subjective: Today, patient was seen and examined bedside.  Complains of mild pain and back but tolerable.  Feels mildly sleepy.  Denies any nausea, vomiting fevers or chills.  Objective: Vitals:   06/15/21 0638 06/15/21 1452 06/15/21 2114 06/16/21 0515  BP: 133/62 133/71 (!) 133/55 (!) 130/57  Pulse: 78 83 83 79  Resp: 18 18 19 18   Temp: 97.9 F (36.6 C) 98.3 F (36.8 C) 97.8 F (36.6 C)  98.2 F (36.8 C)  TempSrc: Oral Oral Oral Oral  SpO2: 97% 97% 97% 97%  Weight:      Height:        Intake/Output Summary (Last 24 hours) at 06/16/2021 1145 Last data filed at 06/16/2021 1011 Gross per 24 hour  Intake 331 ml  Output 250 ml  Net 81 ml    Filed Weights   06/06/21 1852  Weight: 136.1 kg    Physical examination: General:   Morbidly obese built, not in obvious distress HENT:   No scleral pallor or icterus noted. Oral mucosa is moist.  Chest:   Diminished breath sounds bilaterally. No crackles or wheezes.  CVS: S1 &S2 heard. No murmur.  Regular rate and rhythm. Abdomen: Soft, nontender, nondistended.  Bowel sounds are heard.   Extremities: No cyanosis, clubbing or edema.  Peripheral pulses are palpable. Psych: Alert, awake and communicative, oriented to person and place. CNS:  No cranial nerve deficits.  Moves all extremities. Skin: Warm and dry.  No rashes noted.  Data Reviewed: I have personally reviewed following labs and imaging studies  CBC: Recent Labs  Lab 06/11/21 0444 06/12/21 0459 06/13/21 0539 06/14/21 0538 06/15/21 0528  WBC 6.9 6.7 7.3 7.3 8.4  HGB 11.9* 11.2* 11.3* 11.0* 10.8*  HCT 40.6 38.1 38.7 37.4 37.2  MCV 93.5 92.7 92.6 92.6 92.8  PLT 315 305 300 273 161    Basic Metabolic Panel: Recent Labs  Lab 06/10/21 0502 06/12/21 0459 06/13/21 0539  NA 136 136 138  K 4.6 4.2 4.1  CL 102 100 99  CO2 27 29 30   GLUCOSE 154* 110* 98  BUN 18 16 15   CREATININE 1.34* 1.26* 1.33*  CALCIUM 9.1 9.7 9.8  MG 2.0 2.2  --     GFR: Estimated Creatinine Clearance: 48.8 mL/min (A) (by C-G formula based on SCr of 1.33 mg/dL (H)). Liver Function Tests: No results for input(s): AST, ALT, ALKPHOS, BILITOT, PROT, ALBUMIN in the last 168 hours.  No results for input(s): LIPASE, AMYLASE in the last 168 hours. No results for input(s): AMMONIA in the last 168 hours. Coagulation Profile: Recent Labs  Lab 06/12/21 0459  INR 1.1    Cardiac Enzymes: No results for input(s): CKTOTAL, CKMB, CKMBINDEX, TROPONINI in the last 168 hours. BNP (last 3 results) No results for input(s): PROBNP in the last 8760 hours. HbA1C: No results for input(s): HGBA1C in the last 72 hours. CBG: Recent Labs  Lab 06/15/21 0743 06/15/21 1241 06/15/21 1708 06/15/21 2131 06/16/21 0748  GLUCAP 142* 157* 153* 165*  151*    Lipid Profile: No results for input(s): CHOL, HDL, LDLCALC, TRIG, CHOLHDL, LDLDIRECT in the last 72 hours. Thyroid Function Tests: No results for input(s): TSH, T4TOTAL, FREET4, T3FREE, THYROIDAB in the last 72 hours. Anemia Panel: No results for input(s): VITAMINB12, FOLATE, FERRITIN, TIBC, IRON, RETICCTPCT in the last 72 hours. Sepsis Labs: No results for input(s): PROCALCITON, LATICACIDVEN in the last 168 hours.   Recent Results (from the past 240 hour(s))  Resp Panel by RT-PCR (Flu A&B, Covid) Nasopharyngeal Swab     Status: None   Collection Time: 06/06/21  8:02 PM   Specimen: Nasopharyngeal Swab; Nasopharyngeal(NP) swabs in vial transport medium  Result Value Ref Range Status   SARS Coronavirus 2 by RT PCR NEGATIVE NEGATIVE Final    Comment: (NOTE) SARS-CoV-2 target nucleic acids are NOT DETECTED.  The SARS-CoV-2 RNA is generally detectable in upper respiratory specimens during the acute phase of infection. The lowest concentration of  SARS-CoV-2 viral copies this assay can detect is 138 copies/mL. A negative result does not preclude SARS-Cov-2 infection and should not be used as the sole basis for treatment or other patient management decisions. A negative result may occur with  improper specimen collection/handling, submission of specimen other than nasopharyngeal swab, presence of viral mutation(s) within the areas targeted by this assay, and inadequate number of viral copies(<138 copies/mL). A negative result must be combined with clinical observations, patient history, and epidemiological information. The expected result is Negative.  Fact Sheet for Patients:  EntrepreneurPulse.com.au  Fact Sheet for Healthcare Providers:  IncredibleEmployment.be  This test is no t yet approved or cleared by the [place] FDA and  has been authorized for detection and/or diagnosis of SARS-CoV-2 by FDA under an Emergency Use  Authorization (EUA). This EUA will remain  in effect (meaning this test can be used) for the duration of the COVID-19 declaration under Section 564(b)(1) of the Act, 21 U.S.C.section 360bbb-3(b)(1), unless the authorization is terminated  or revoked sooner.       Influenza A by PCR NEGATIVE NEGATIVE Final   Influenza B by PCR NEGATIVE NEGATIVE Final    Comment: (NOTE) The Xpert Xpress SARS-CoV-2/FLU/RSV plus assay is intended as an aid in the diagnosis of influenza from Nasopharyngeal swab specimens and should not be used as a sole basis for treatment. Nasal washings and aspirates are unacceptable for Xpert Xpress SARS-CoV-2/FLU/RSV testing.  Fact Sheet for Patients: EntrepreneurPulse.com.au  Fact Sheet for Healthcare Providers: IncredibleEmployment.be  This test is not yet approved or cleared by the [place] FDA and has been authorized for detection and/or diagnosis of SARS-CoV-2 by FDA under an Emergency Use Authorization (EUA). This EUA will remain in effect (meaning this test can be used) for the duration of the COVID-19 declaration under Section 564(b)(1) of the Act, 21 U.S.C. section 360bbb-3(b)(1), unless the authorization is terminated or revoked.  Performed at [facility], [place] [address] [place], [place] [address]   Surgical PCR screen     Status: None   Collection Time: 06/11/21 11:09 PM   Specimen: Nasal Mucosa; Nasal Swab  Result Value Ref Range Status   MRSA, PCR NEGATIVE NEGATIVE Final   Staphylococcus aureus NEGATIVE NEGATIVE Final    Comment: (NOTE) The Xpert SA Assay (FDA approved for NASAL specimens in patients 32 years of age and older), is one component of a comprehensive surveillance program. It is not intended to diagnose infection nor to guide or monitor treatment. Performed at [facility], [place] [address] [place], [place] [address]        Radiology Studies: [facility]  Chest Port 1 View  Result Date: 06/16/2021 CLINICAL DATA:  Preoperative assessment EXAM: PORTABLE CHEST 1 VIEW COMPARISON:  06/06/2021 FINDINGS: Elevation of the right diaphragm. Low lung volumes. Mild bilateral interstitial thickening. No focal consolidation. No pleural effusion or pneumothorax. Stable cardiomediastinal silhouette. No acute osseous abnormality. IMPRESSION: Low lung volumes with interstitial prominence which may be secondary to hypoinflation and crowding of the interstitial markings versus mild pulmonary vascular congestion. Electronically Signed   By: Kathreen Devoid M.D.   On: 06/16/2021 08:39    Scheduled Meds:  acidophilus  1 capsule Oral Daily   vitamin C  250 mg Oral BID   benzonatate  200 mg Oral TID   bisacodyl  10 mg Rectal Daily   buPROPion  150 mg Oral BID   docusate sodium  100 mg Oral Daily   donepezil  5 mg Oral  Daily   DULoxetine  20 mg Oral Daily   exemestane  25 mg Oral Daily   famotidine  20 mg Oral BID   gabapentin  200 mg Oral BID   insulin aspart  0-15 Units Subcutaneous TID WC & HS   linaclotide  72 mcg Oral q morning   metoprolol succinate  50 mg Oral BID   multivitamin with minerals  1 tablet Oral Daily   polyethylene glycol  17 g Oral Daily   Ensure Max Protein  11 oz Oral BID   senna-docusate  1 tablet Oral BID   zinc sulfate  220 mg Oral Daily   Continuous Infusions:   ceFAZolin (ANCEF) IV     heparin 1,300 Units/hr (06/16/21 0304)     LOS: 9 days    Flora Lipps, MD [facility] If 7PM-7AM, please contact night-coverage [email or web address] 06/16/2021, 11:45 AM

## 2021-06-17 ENCOUNTER — Inpatient Hospital Stay (HOSPITAL_COMMUNITY): Payer: Medicare Other | Admitting: Certified Registered"

## 2021-06-17 ENCOUNTER — Encounter (HOSPITAL_COMMUNITY)
Admission: EM | Disposition: A | Discharge status: Skilled Nursing Facility | Payer: Self-pay | Source: Home / Self Care | Attending: Internal Medicine

## 2021-06-17 ENCOUNTER — Other Ambulatory Visit: Payer: Self-pay | Admitting: Student

## 2021-06-17 ENCOUNTER — Inpatient Hospital Stay (HOSPITAL_COMMUNITY)
Admit: 2021-06-17 | Discharge: 2021-06-17 | Disposition: A | Discharge status: Home / Self Care | Payer: Medicare Other | Attending: Internal Medicine | Admitting: Internal Medicine

## 2021-06-17 DIAGNOSIS — S32019A Unspecified fracture of first lumbar vertebra, initial encounter for closed fracture: Secondary | ICD-10-CM | POA: Diagnosis not present

## 2021-06-17 DIAGNOSIS — M545 Low back pain, unspecified: Secondary | ICD-10-CM | POA: Diagnosis not present

## 2021-06-17 DIAGNOSIS — E1169 Type 2 diabetes mellitus with other specified complication: Secondary | ICD-10-CM | POA: Diagnosis not present

## 2021-06-17 DIAGNOSIS — N1832 Chronic kidney disease, stage 3b: Secondary | ICD-10-CM | POA: Diagnosis not present

## 2021-06-17 HISTORY — PX: RADIOLOGY WITH ANESTHESIA: SHX6223

## 2021-06-17 HISTORY — PX: IR KYPHO LUMBAR INC FX REDUCE BONE BX UNI/BIL CANNULATION INC/IMAGING: IMG5519

## 2021-06-17 LAB — CBC
HCT: 39.9 % (ref 36.0–46.0)
Hemoglobin: 11.3 g/dL — ABNORMAL LOW (ref 12.0–15.0)
MCH: 27 pg (ref 26.0–34.0)
MCHC: 28.3 g/dL — ABNORMAL LOW (ref 30.0–36.0)
MCV: 95.2 fL (ref 80.0–100.0)
Platelets: 253 10*3/uL (ref 150–400)
RBC: 4.19 MIL/uL (ref 3.87–5.11)
RDW: 15.2 % (ref 11.5–15.5)
WBC: 7.3 10*3/uL (ref 4.0–10.5)
nRBC: 0.3 % — ABNORMAL HIGH (ref 0.0–0.2)

## 2021-06-17 LAB — BASIC METABOLIC PANEL
Anion gap: 7 (ref 5–15)
BUN: 20 mg/dL (ref 8–23)
CO2: 31 mmol/L (ref 22–32)
Calcium: 9.9 mg/dL (ref 8.9–10.3)
Chloride: 100 mmol/L (ref 98–111)
Creatinine, Ser: 1.18 mg/dL — ABNORMAL HIGH (ref 0.44–1.00)
GFR, Estimated: 47 mL/min — ABNORMAL LOW (ref >60)
Glucose, Bld: 128 mg/dL — ABNORMAL HIGH (ref 70–99)
Potassium: 4.4 mmol/L (ref 3.5–5.1)
Sodium: 138 mmol/L (ref 135–145)

## 2021-06-17 LAB — HEPARIN LEVEL (UNFRACTIONATED): Heparin Unfractionated: 0.63 IU/mL (ref 0.30–0.70)

## 2021-06-17 LAB — GLUCOSE, CAPILLARY
Glucose-Capillary: 150 mg/dL — ABNORMAL HIGH (ref 70–99)
Glucose-Capillary: 136 mg/dL — ABNORMAL HIGH (ref 70–99)
Glucose-Capillary: 183 mg/dL — ABNORMAL HIGH (ref 70–99)

## 2021-06-17 SURGERY — IR WITH ANESTHESIA
Anesthesia: General

## 2021-06-17 MED ORDER — PROPOFOL 10 MG/ML IV BOLUS
INTRAVENOUS | Status: DC | PRN
  Administered 2021-06-17: 130 mg via INTRAVENOUS

## 2021-06-17 MED ORDER — PHENYLEPHRINE 40 MCG/ML (10ML) SYRINGE FOR IV PUSH (FOR BLOOD PRESSURE SUPPORT)
PREFILLED_SYRINGE | INTRAVENOUS | Status: DC | PRN
  Administered 2021-06-17: 120 ug via INTRAVENOUS
  Administered 2021-06-17 (×2): 80 ug via INTRAVENOUS
  Administered 2021-06-17: 200 ug via INTRAVENOUS
  Administered 2021-06-17: 80 ug via INTRAVENOUS

## 2021-06-17 MED ORDER — EPHEDRINE SULFATE-NACL 50-0.9 MG/10ML-% IV SOSY
PREFILLED_SYRINGE | INTRAVENOUS | Status: DC | PRN
  Administered 2021-06-17: 5 mg via INTRAVENOUS
  Administered 2021-06-17: 10 mg via INTRAVENOUS
  Administered 2021-06-17: 5 mg via INTRAVENOUS

## 2021-06-17 MED ORDER — ONDANSETRON HCL 4 MG/2ML IJ SOLN
INTRAMUSCULAR | Status: DC | PRN
Start: 2021-06-17 — End: 2021-06-17
  Administered 2021-06-17: 4 mg via INTRAVENOUS

## 2021-06-17 MED ORDER — LIDOCAINE HCL (CARDIAC) PF 100 MG/5ML IV SOSY
PREFILLED_SYRINGE | INTRAVENOUS | Status: DC | PRN
  Administered 2021-06-17: 100 mg via INTRAVENOUS

## 2021-06-17 MED ORDER — FENTANYL CITRATE (PF) 100 MCG/2ML IJ SOLN
INTRAMUSCULAR | Status: AC
  Filled 2021-06-17: qty 2

## 2021-06-17 MED ORDER — ROCURONIUM BROMIDE 10 MG/ML (PF) SYRINGE
PREFILLED_SYRINGE | INTRAVENOUS | Status: DC | PRN
  Administered 2021-06-17: 100 mg via INTRAVENOUS

## 2021-06-17 MED ORDER — CHLORHEXIDINE GLUCONATE 0.12 % MT SOLN
15.0000 mL | Freq: Once | OROMUCOSAL | Status: AC
  Administered 2021-06-17: 12:00:00 15 mL via OROMUCOSAL

## 2021-06-17 MED ORDER — DEXAMETHASONE SODIUM PHOSPHATE 10 MG/ML IJ SOLN
INTRAMUSCULAR | Status: DC | PRN
  Administered 2021-06-17: 10 mg via INTRAVENOUS

## 2021-06-17 MED ORDER — FENTANYL CITRATE PF 50 MCG/ML IJ SOSY: 25.0000 ug | PREFILLED_SYRINGE | INTRAMUSCULAR | Status: DC | PRN

## 2021-06-17 MED ORDER — IOHEXOL 300 MG/ML  SOLN
50.0000 mL | Freq: Once | INTRAMUSCULAR | Status: AC | PRN
  Administered 2021-06-17: 14:00:00 10 mL via INTRATHECAL

## 2021-06-17 MED ORDER — IOHEXOL 300 MG/ML  SOLN
10.0000 mL | Freq: Once | INTRAMUSCULAR | Status: AC | PRN
Start: 2021-06-17 — End: 2021-06-17
  Administered 2021-06-17: 14:00:00 10 mL

## 2021-06-17 MED ORDER — LIDOCAINE HCL (PF) 1 % IJ SOLN
INTRAMUSCULAR | Status: AC
  Administered 2021-06-17: 14:00:00 7 mL
  Filled 2021-06-17: qty 30

## 2021-06-17 MED ORDER — SUGAMMADEX SODIUM 200 MG/2ML IV SOLN
INTRAVENOUS | Status: DC | PRN
  Administered 2021-06-17: 100 mg via INTRAVENOUS
  Administered 2021-06-17: 300 mg via INTRAVENOUS

## 2021-06-17 MED ORDER — APIXABAN 5 MG PO TABS
5.0000 mg | ORAL_TABLET | Freq: Two times a day (BID) | ORAL | Status: DC
  Administered 2021-06-17 – 2021-06-19 (×5): 5 mg via ORAL
  Filled 2021-06-17 (×5): qty 1

## 2021-06-17 MED ORDER — LACTATED RINGERS IV SOLN: INTRAVENOUS | Status: DC

## 2021-06-17 MED ORDER — HYDROMORPHONE HCL 1 MG/ML IJ SOLN
0.5000 mg | Freq: Once | INTRAMUSCULAR | Status: AC
  Administered 2021-06-17: 19:00:00 0.5 mg via INTRAVENOUS
  Filled 2021-06-17: qty 0.5

## 2021-06-17 MED ORDER — FENTANYL CITRATE (PF) 100 MCG/2ML IJ SOLN
INTRAMUSCULAR | Status: DC | PRN
  Administered 2021-06-17: 50 ug via INTRAVENOUS

## 2021-06-17 NOTE — Progress Notes (Signed)
Patient returned to unit from procedure. Report received from [place], [place]. Patient resting comfortably in bed, no needs verbalized at this time. Call light and personal items at bedside. Son at bedside as well.

## 2021-06-17 NOTE — Transfer of Care (Signed)
Immediate Anesthesia Transfer of Care Note  Patient: [name]  Procedure(s) Performed: KYPHOPLASTY  Patient Location: PACU  Anesthesia Type:General  Level of Consciousness: awake, drowsy and patient cooperative  Airway & Oxygen Therapy: Patient Spontanous Breathing and Patient connected to nasal cannula oxygen  Post-op Assessment: Report given to RN and Post -op Vital signs reviewed and stable  Post vital signs: Reviewed and stable  Last Vitals:  Vitals Value Taken Time  BP 146/91 06/17/21 1446  Temp    Pulse 84 06/17/21 1451  Resp 20 06/17/21 1451  SpO2 91 % 06/17/21 1451  Vitals shown include unvalidated device data.  Last Pain:  Vitals:   06/17/21 1142  TempSrc: Oral  PainSc:       Patients Stated Pain Goal: 0 (15/04/13 6438)  Complications: No notable events documented.

## 2021-06-17 NOTE — Anesthesia Procedure Notes (Signed)
Procedure Name: Intubation Date/Time: 06/17/2021 1:11 PM Performed by: Raenette Rover, CRNA Pre-anesthesia Checklist: Patient identified, Emergency Drugs available, Suction available and Patient being monitored Patient Re-evaluated:Patient Re-evaluated prior to induction Oxygen Delivery Method: Circle system utilized Preoxygenation: Pre-oxygenation with 100% oxygen Induction Type: IV induction Ventilation: Mask ventilation without difficulty Laryngoscope Size: Mac and 3 Grade View: Grade I Tube type: Oral Tube size: 7.0 mm Number of attempts: 1 Airway Equipment and Method: Stylet Placement Confirmation: ETT inserted through vocal cords under direct vision, positive ETCO2 and breath sounds checked- equal and bilateral Secured at: 21 cm Tube secured with: Tape Dental Injury: Teeth and Oropharynx as per pre-operative assessment

## 2021-06-17 NOTE — Progress Notes (Signed)
Nutrition Follow-up  DOCUMENTATION CODES:   Morbid obesity  INTERVENTION:   -Ensure MAX Protein po BID, each supplement provides 150 kcal and 30 grams of protein   -Multivitamin with minerals daily  -250 mg Vitamin C BID -220 mg zinc sulfate daily  NUTRITION DIAGNOSIS:   Increased nutrient needs related to wound healing as evidenced by estimated needs.  Ongoing.  GOAL:   Patient will meet greater than or equal to 90% of their needs  Progressing.  MONITOR:   Supplement acceptance, Weight trends, Labs, PO intake, I & O's, Skin  ASSESSMENT:   81 y.o. female with medical history of diabetes type 2, hypertension, rheumatoid arthritis, breast and uterine cancer, chronic back pain, hyperlipidemia presented to hospital with low back pain.  Patient is pretty much wheelchair-bound for the last 3 years and is currently at assisted living facility.  Patient currently in OR for kyphoplasty. Originally planned for 1/20, moved to today.  Consuming  0-75% of meals when on a diet. Pt will need to eat more consistently to better control blood sugars (discussed this with her on 1/18). Last accepted Ensure Max on 1/23.   Medications: Vitamin C, Dulcolax, Colace, Multivitamin with minerals daily, Miralax, Senokot, Lactated ringers  Labs reviewed: CBGs: 110-203  Diet Order:   Diet Order             Diet NPO time specified  Diet effective midnight                   EDUCATION NEEDS:   Not appropriate for education at this time  Skin:  Skin Assessment: Skin Integrity Issues: Skin Integrity Issues:: Stage II Stage II: rt buttocks  Last BM:  1/22  Height:   Ht Readings from Last 1 Encounters:  06/06/21 5\' 5"  (1.651 m)    Weight:   Wt Readings from Last 1 Encounters:  06/06/21 136.1 kg    BMI:  Body mass index is 49.92 kg/m.  Estimated Nutritional Needs:   Kcal:  1950-2150  Protein:  85-95g  Fluid:  2L/day  Clayton Bibles, MS, RD, LDN Inpatient Clinical  Dietitian Contact information available via Amion

## 2021-06-17 NOTE — Anesthesia Preprocedure Evaluation (Addendum)
Anesthesia Evaluation  Patient identified by MRN, date of birth, ID band Patient awake    Reviewed: Allergy & Precautions, NPO status , Patient's Chart, lab work & pertinent test results  Airway Mallampati: III  TM Distance: >3 FB Neck ROM: Full    Dental  (+) Dental Advisory Given, Poor Dentition, Missing, Chipped   Pulmonary PE   Pulmonary exam normal breath sounds clear to auscultation       Cardiovascular hypertension, Pt. on medications Normal cardiovascular exam Rhythm:Regular Rate:Normal  TTE 2017 - Left ventricle: The cavity size was normal. Wall thickness was  normal. Systolic function was normal. The estimated ejection  fraction was in the range of 55% to 60%. Regional wall motion  abnormalities cannot be excluded.  - Left atrium: The atrium was mildly dilated.  - Right ventricle: The cavity size was mildly dilated. Wall  thickness was normal. Systolic function was moderately reduced  (strain in the setting of bilateral PE). No prior echo to  compare.  - Right atrium: The atrium was moderately dilated.  - Pulmonary arteries: Systolic pressure was mildly to moderately  increased. PA peak pressure: 46 mm Hg (S).   Neuro/Psych PSYCHIATRIC DISORDERS Depression negative neurological ROS     GI/Hepatic negative GI ROS, Neg liver ROS,   Endo/Other  diabetes, Type 2, Oral Hypoglycemic AgentsMorbid obesity (BMI 50)  Renal/GU Renal InsufficiencyRenal diseaseLab Results      Component                Value               Date                      CREATININE               1.18 (H)            06/17/2021                BUN                      20                  06/17/2021                NA                       138                 06/17/2021                K                        4.4                 06/17/2021                CL                       100                 06/17/2021                CO2                       31  06/17/2021             negative genitourinary   Musculoskeletal  (+) Arthritis , Rheumatoid disorders,    Abdominal   Peds  Hematology  (+) Blood dyscrasia (on eliquis), ,   Anesthesia Other Findings medical history significant for DMT2, HTN, RA, Breast and uterine cancer, chronic back pain, HLD who presents for evaluation of low back pain.  Patient has been wheelchair-bound for the last 3 years but has been able to stand up to get in and out of bed, chairs or to the bathroom but 3 weeks ago had an increase in her low back pain severity. MRI showed L1 inferior endplate fracture. Kyphoplasty attempted 06/12/21 but pt unable to tolerate position.  Reproductive/Obstetrics                           Anesthesia Physical Anesthesia Plan  ASA: 3  Anesthesia Plan: General   Post-op Pain Management: Tylenol PO (pre-op) and Minimal or no pain anticipated   Induction: Intravenous  PONV Risk Score and Plan: 3 and Dexamethasone, Ondansetron and Treatment may vary due to age or medical condition  Airway Management Planned: Oral ETT  Additional Equipment:   Intra-op Plan:   Post-operative Plan: Extubation in OR  Informed Consent: I have reviewed the patients History and Physical, chart, labs and discussed the procedure including the risks, benefits and alternatives for the proposed anesthesia with the patient or authorized representative who has indicated his/her understanding and acceptance.     Dental advisory given  Plan Discussed with: CRNA  Anesthesia Plan Comments:         Anesthesia Quick Evaluation

## 2021-06-17 NOTE — Procedures (Signed)
Interventional Radiology Procedure Note  Procedure:  Image guided L1 KP, via right unilateral pedicular approach.  .  Anesthesia: GETA EBL: 0cc  Complications: None  Recommendations:  - Supine x 4 hours - Do not submerge for 7 days - Routine wound care - ok to restart AC in 4 hours  - advance diet per primary  Signed,  Dulcy Fanny. Earleen Newport, DO

## 2021-06-17 NOTE — Progress Notes (Signed)
Patient ID: [name], female   DOB: 11/15/1940, 81 y.o.   MRN: [id number] Patient scheduled for L1 KP today.  Vital signs are currently stable.  She is without new complaints.  IV heparin has been stopped.  Consent signed earlier after discussing case with patient and patient's son.  Both patient and patient's son aware of today's plans.  Anesthesia assistance will be utilized today.

## 2021-06-17 NOTE — Progress Notes (Signed)
[facility] [place] for Eliquis >> heparin drip Indication: history of saddle PE  Allergies  Allergen Reactions   Iodine Rash    Patient Measurements: Height: 5\' 5"  (165.1 cm) Weight: 136.1 kg (300 lb) IBW/kg (Calculated) : 57 Heparin Dosing Weight: 90.7 kg  Vital Signs: Temp: 97.9 F (36.6 C) (01/24 2100) Temp Source: Oral (01/24 2100) BP: 125/58 (01/24 2100) Pulse Rate: 80 (01/24 2100)  Labs: Recent Labs    06/15/21 0528 06/16/21 0550 06/17/21 0527  HGB 10.8*  --  11.3*  HCT 37.2  --  39.9  PLT 265  --  253  HEPARINUNFRC 0.59 0.63 0.63  CREATININE  --   --  1.18*     Estimated Creatinine Clearance: 55 mL/min (A) (by C-G formula based on SCr of 1.18 mg/dL (H)).  Assessment: 81 year old female with notable PMH of saddle PE (2017) on Eliquis 5mg  PO BID presents with back pain after working with therapy.  MRI in the ED obtained which showed L1 inferior endplate fracture with no cord involvement.  NSGY advised for TLSO brace to be placed and no surgical intervention needed.  TRH discussing with IR for the possibility of kyphoplasty.  Eliquis has been held - last dose 1/16 @ 1059.  Pharmacy has been consulted to dose heparin drip while Eliquis is on hold.  1/20: Heparin held for kyphoplasty which was cancelled and rescheduled for 1/25.  Heparin resumed at previously therapeutic rate.   Today, 06/17/2021: - Heparin level therapeutic with heparin drip running at 1300 units/hr - Hgb stable, plts wnl - No bleeding documented  Goal of Therapy:  Monitor platelets by anticoagulation protocol: Yes   Plan:  - Heparin stopped at 0700 in preparation for surgery scheduled at 1300 today - daily CBC and heparin level while on heparin - monitor for s/sx bleeding - Follow-up continuation of heparin infusion or oral anticoagulation post-op per Surgery/Attending  Royetta Asal, PharmD, [place] Please utilize Amion for  appropriate phone number to reach the unit pharmacist ([place]) 06/17/2021 7:58 AM

## 2021-06-17 NOTE — Progress Notes (Signed)
PROGRESS NOTE    [name]  DQQ:[id number] DOB: Nov 25, 1940 DOA: 06/06/2021 PCP: Willey Blade, MD   Brief Narrative:   [name] is a 81 y.o. female with medical history of diabetes type 2, hypertension, rheumatoid arthritis, breast and uterine cancer, chronic back pain, hyperlipidemia presented to the hospital with low back pain.  Patient is pretty much wheelchair-bound for the last 3 years and is currently at assisted living facility.  Patient was trying to reach out to the therapist when she felt a pop in her lower back and has been having pain since then.  MRI was obtained in the ED which showed the L1 inferior endplate fracture.  ED provider spoke with neurosurgery Dr. Zada Finders who recommended TSL0 versus and no surgical intervention.  In the ED, patient was also noted to have hyperkalemia and was on spironolactone as outpatient with a stage IIIb. At this time, IR has been consulted for possible kyphoplasty but kyphoplasty could not be done on 06/12/2021 and plan is kyphoplasty under sedation on 06/17/21.  Assessment & Plan:  Closed L1 vertebral fracture:   Awaiting for kyphoplasty  under anesthesia on 06/17/2021 .  We will get PT evaluation after kyphoplasty.  Low-grade fever.   Resolved.   Received 3-day course of Rocephin and Zithromax for possible pneumonia.  On supplemental oxygen.  Will wean as able but likely secondary to sedentary status with decreased chest wall excursion.  Acute metabolic encephalopathy:  Improved likely component of sundowning as well.  off gabapentin since 06/16/2021.  Been taken off Dilaudid IV.  Hyperkalemia. Resolved.  Received Lokelma.  Diabetes mellitus type 2.   Controlled.  Continue sliding scale insulin, Accu-Cheks.  Latest POC glucose of 150   CKD stage IIIb .   Latest creatinine of 1.1.  Check BMP in AM.  Essential hypertension  Continue metoprolol.  Spironolactone on hold due to hyperkalemia   Right buttocks stage II  pressure injury  present on admission.  Continue wound care.  GERD:  Continue Pepcid  Neuropathy/chronic back pain:  On gabapentin and cymbalta.  Gabapentin has been on hold since 06/16/2021   History of VTE/saddle pulmonary embolism.   heparin drip on hold for procedure today.  Hx of left breast cancer: Continue Aromasin.  Follows up with oncology as outpatient.  Depression/memory issues Continue welbutrin and aricept  Normocytic anemia:  Latest hemoglobin of 10.8.  DVT prophylaxis: Heparin drip on hold.  Will resume oral anticoagulation after procedure  Code Status: Full code  Family Communication:   I spoke with the patient's son on the phone on 06/16/2021  Disposition Plan:  Uncertain at this time. Awaiting for IR intervention.  Consultants:  Neurosurgery IR  Procedures:  None  Antimicrobials:  None   Subjective: Today, patient was seen and examined today.  Denies overt pain.  Awaiting for kyphoplasty today.  Denies shortness of breath cough fever chills.   Objective: Vitals:   06/15/21 2114 06/16/21 0515 06/16/21 1325 06/16/21 2100  BP: (!) 133/55 (!) 130/57 (!) 144/69 (!) 125/58  Pulse: 83 79 77 80  Resp: 19 18 18 18   Temp: 97.8 F (36.6 C) 98.2 F (36.8 C) 97.8 F (36.6 C) 97.9 F (36.6 C)  TempSrc: Oral Oral Oral Oral  SpO2: 97% 97% 96% 96%  Weight:      Height:        Intake/Output Summary (Last 24 hours) at 06/17/2021 0946 Last data filed at 06/17/2021 0851 Gross per 24 hour  Intake 842.92 ml  Output 550 ml  Net 292.92 ml    Filed Weights   06/06/21 1852  Weight: 136.1 kg    Physical examination: General: Morbidly obese, not in obvious distress HENT:   No scleral pallor or icterus noted. Oral mucosa is moist.  Chest:    Diminished breath sounds bilaterally. No crackles or wheezes.  CVS: S1 &S2 heard. No murmur.  Regular rate and rhythm. Abdomen: Soft, nontender, nondistended.  Bowel sounds are heard.   Extremities: No cyanosis,  clubbing or edema.  Peripheral pulses are palpable. Psych: Alert, awake and oriented, normal mood CNS:  No cranial nerve deficits.  Moves all extremities Skin: Warm and dry.  No rashes noted.   Data Reviewed: I have personally reviewed following labs and imaging studies  CBC: Recent Labs  Lab 06/12/21 0459 06/13/21 0539 06/14/21 0538 06/15/21 0528 06/17/21 0527  WBC 6.7 7.3 7.3 8.4 7.3  HGB 11.2* 11.3* 11.0* 10.8* 11.3*  HCT 38.1 38.7 37.4 37.2 39.9  MCV 92.7 92.6 92.6 92.8 95.2  PLT 305 300 273 265 701    Basic Metabolic Panel: Recent Labs  Lab 06/12/21 0459 06/13/21 0539 06/17/21 0527  NA 136 138 138  K 4.2 4.1 4.4  CL 100 99 100  CO2 29 30 31   GLUCOSE 110* 98 128*  BUN 16 15 20   CREATININE 1.26* 1.33* 1.18*  CALCIUM 9.7 9.8 9.9  MG 2.2  --   --     GFR: Estimated Creatinine Clearance: 55 mL/min (A) (by C-G formula based on SCr of 1.18 mg/dL (H)). Liver Function Tests: No results for input(s): AST, ALT, ALKPHOS, BILITOT, PROT, ALBUMIN in the last 168 hours.  No results for input(s): LIPASE, AMYLASE in the last 168 hours. No results for input(s): AMMONIA in the last 168 hours. Coagulation Profile: Recent Labs  Lab 06/12/21 0459  INR 1.1    Cardiac Enzymes: No results for input(s): CKTOTAL, CKMB, CKMBINDEX, TROPONINI in the last 168 hours. BNP (last 3 results) No results for input(s): PROBNP in the last 8760 hours. HbA1C: No results for input(s): HGBA1C in the last 72 hours. CBG: Recent Labs  Lab 06/16/21 0748 06/16/21 1204 06/16/21 1612 06/16/21 2102 06/17/21 0759  GLUCAP 151* 203* 184* 110* 150*    Lipid Profile: No results for input(s): CHOL, HDL, LDLCALC, TRIG, CHOLHDL, LDLDIRECT in the last 72 hours. Thyroid Function Tests: No results for input(s): TSH, T4TOTAL, FREET4, T3FREE, THYROIDAB in the last 72 hours. Anemia Panel: No results for input(s): VITAMINB12, FOLATE, FERRITIN, TIBC, IRON, RETICCTPCT in the last 72 hours. Sepsis  Labs: No results for input(s): PROCALCITON, LATICACIDVEN in the last 168 hours.   Recent Results (from the past 240 hour(s))  Surgical PCR screen     Status: None   Collection Time: 06/11/21 11:09 PM   Specimen: Nasal Mucosa; Nasal Swab  Result Value Ref Range Status   MRSA, PCR NEGATIVE NEGATIVE Final   Staphylococcus aureus NEGATIVE NEGATIVE Final    Comment: (NOTE) The Xpert SA Assay (FDA approved for NASAL specimens in patients 57 years of age and older), is one component of a comprehensive surveillance program. It is not intended to diagnose infection nor to guide or monitor treatment. Performed at [facility], [place] [address], [place], [place] [address]        Radiology Studies: [facility] Chest Port 1 View  Result Date: 06/16/2021 CLINICAL DATA:  Preoperative assessment EXAM: PORTABLE CHEST 1 VIEW COMPARISON:  06/06/2021 FINDINGS: Elevation of the right diaphragm. Low lung volumes.  Mild bilateral interstitial thickening. No focal consolidation. No pleural effusion or pneumothorax. Stable cardiomediastinal silhouette. No acute osseous abnormality. IMPRESSION: Low lung volumes with interstitial prominence which may be secondary to hypoinflation and crowding of the interstitial markings versus mild pulmonary vascular congestion. Electronically Signed   By: Kathreen Devoid M.D.   On: 06/16/2021 08:39    Scheduled Meds:  acidophilus  1 capsule Oral Daily   vitamin C  250 mg Oral BID   benzonatate  200 mg Oral TID   bisacodyl  10 mg Rectal Daily   buPROPion  150 mg Oral BID   docusate sodium  100 mg Oral Daily   donepezil  5 mg Oral Daily   DULoxetine  20 mg Oral Daily   exemestane  25 mg Oral Daily   famotidine  20 mg Oral BID   gabapentin  200 mg Oral BID   insulin aspart  0-15 Units Subcutaneous TID WC & HS   linaclotide  72 mcg Oral q morning   metoprolol succinate  50 mg Oral BID   multivitamin with minerals  1 tablet Oral Daily   polyethylene glycol  17 g  Oral Daily   Ensure Max Protein  11 oz Oral BID   senna-docusate  1 tablet Oral BID   zinc sulfate  220 mg Oral Daily   Continuous Infusions:   ceFAZolin (ANCEF) IV       LOS: 10 days    Flora Lipps, MD [facility] If 7PM-7AM, please contact night-coverage [email or web address] 06/17/2021, 9:46 AM

## 2021-06-18 ENCOUNTER — Encounter (HOSPITAL_COMMUNITY): Payer: Self-pay | Admitting: Interventional Radiology

## 2021-06-18 DIAGNOSIS — E1169 Type 2 diabetes mellitus with other specified complication: Secondary | ICD-10-CM | POA: Diagnosis not present

## 2021-06-18 DIAGNOSIS — N1832 Chronic kidney disease, stage 3b: Secondary | ICD-10-CM | POA: Diagnosis not present

## 2021-06-18 DIAGNOSIS — M545 Low back pain, unspecified: Secondary | ICD-10-CM | POA: Diagnosis not present

## 2021-06-18 DIAGNOSIS — S32019A Unspecified fracture of first lumbar vertebra, initial encounter for closed fracture: Secondary | ICD-10-CM | POA: Diagnosis not present

## 2021-06-18 LAB — CBC
HCT: 36.8 % (ref 36.0–46.0)
Hemoglobin: 11.1 g/dL — ABNORMAL LOW (ref 12.0–15.0)
MCH: 27.7 pg (ref 26.0–34.0)
MCHC: 30.2 g/dL (ref 30.0–36.0)
MCV: 91.8 fL (ref 80.0–100.0)
Platelets: 258 10*3/uL (ref 150–400)
RBC: 4.01 MIL/uL (ref 3.87–5.11)
RDW: 15 % (ref 11.5–15.5)
WBC: 8.4 10*3/uL (ref 4.0–10.5)
nRBC: 0 % (ref 0.0–0.2)

## 2021-06-18 LAB — GLUCOSE, CAPILLARY
Glucose-Capillary: 139 mg/dL — ABNORMAL HIGH (ref 70–99)
Glucose-Capillary: 144 mg/dL — ABNORMAL HIGH (ref 70–99)

## 2021-06-18 NOTE — Care Management Important Message (Signed)
Important Message  Patient Details IM Letter given to the Patient. Name: [name] MRN: [id number] Date of Birth: 19-Mar-1941   Medicare Important Message Given:  Yes     [name] 06/18/2021, 12:53 PM

## 2021-06-18 NOTE — Anesthesia Postprocedure Evaluation (Signed)
Anesthesia Post Note  Patient: [name]  Procedure(s) Performed: KYPHOPLASTY     Patient location during evaluation: PACU Anesthesia Type: General Level of consciousness: patient cooperative, oriented and sedated Pain management: pain level controlled Vital Signs Assessment: post-procedure vital signs reviewed and stable Respiratory status: spontaneous breathing, nonlabored ventilation, respiratory function stable and patient connected to nasal cannula oxygen Cardiovascular status: blood pressure returned to baseline and stable Postop Assessment: no apparent nausea or vomiting Anesthetic complications: no Comments: Delayed entry: pt eval in PACU post op   No notable events documented.                 Midge Minium

## 2021-06-18 NOTE — Progress Notes (Signed)
Referring Physician(s): Pokhrel,L  Supervising Physician: Ruthann Cancer  Patient Status:  [name] - In-pt  Chief Complaint:  Back pain,  L1 fracture  Subjective: Pt resting in bed; denies any worsening back pain, fever/chills/resp issues,N/V   Allergies: Iodine  Medications: Prior to Admission medications   Medication Sig Start Date End Date Taking? Authorizing Provider  acetaminophen (TYLENOL) 500 MG tablet Take 1,000 mg by mouth 2 (two) times daily.   Yes [provider]  albuterol (VENTOLIN HFA) 108 (90 Base) MCG/ACT inhaler Inhale 2 puffs into the lungs every 4 (four) hours as needed (cough). 06/04/21  Yes [provider]  apixaban (ELIQUIS) 5 MG TABS tablet Take 5 mg by mouth 2 (two) times daily.   Yes [provider]  bisacodyl (DULCOLAX) 10 MG suppository Place 10 mg rectally daily as needed (constipation). 11/29/19  Yes [provider]  buPROPion (WELLBUTRIN SR) 150 MG 12 hr tablet Take 150 mg by mouth 3 (three) times daily. 11/11/15  Yes [provider]  CALMOSEPTINE 0.44-20.6 % OINT Apply 1 application topically 3 (three) times daily. Every shift 06/03/21  Yes [provider]  donepezil (ARICEPT) 5 MG tablet Take 5 mg by mouth every morning. 11/21/19  Yes [provider]  Eszopiclone 3 MG TABS Take 3 mg by mouth at bedtime. 05/01/21  Yes [provider]  exemestane (AROMASIN) 25 MG tablet Take 1 tablet by mouth once daily Patient taking differently: 25 mg every morning. 06/05/19  Yes Magrinat, Virgie Dad, MD  fenofibrate 160 MG tablet Take 160 mg by mouth every evening. 10/02/15  Yes [provider]  gabapentin (NEURONTIN) 300 MG capsule TAKE 1 CAPSULE BY MOUTH AT BEDTIME Patient taking differently: Take 300 mg by mouth at bedtime. 02/15/18  Yes Magrinat, Virgie Dad, MD  glipiZIDE-metformin (METAGLIP) 2.5-500 MG tablet Take 1 tablet by mouth 2 (two) times daily. 03/13/21  Yes [provider]   HYDROcodone bit-homatropine (HYCODAN) 5-1.5 MG/5ML syrup 5 mLs every 12 (twelve) hours as needed for cough. 05/11/21  Yes [provider]  ibuprofen (ADVIL) 600 MG tablet Take 600 mg by mouth 3 (three) times daily as needed (pain). 05/01/21  Yes [provider]  LINZESS 72 MCG capsule Take 72 mcg by mouth every morning. 06/03/21  Yes [provider]  Menthol, Topical Analgesic, (BIOFREEZE ROLL-ON) 4 % GEL Apply 1 application topically 2 (two) times daily as needed (knee pain).   Yes [provider]  metoprolol succinate (TOPROL-XL) 50 MG 24 hr tablet Take 50 mg by mouth every morning. 12/19/12  Yes [provider]  Multiple Vitamin (MULTIVITAMIN WITH MINERALS) TABS tablet Take 1 tablet by mouth every morning.   Yes [provider]  Probiotic Product (RESTORA) CAPS Take 1 capsule by mouth at bedtime. 05/01/21  Yes [provider]  SENNA-PLUS 8.6-50 MG tablet Take 2 tablets by mouth every morning. 11/21/19  Yes [provider]  spironolactone (ALDACTONE) 50 MG tablet Take 50 mg by mouth every morning. 04/24/17  Yes [provider]  tiZANidine (ZANAFLEX) 4 MG tablet Take 4 mg by mouth every 8 (eight) hours as needed for muscle spasms. 06/03/21  Yes [provider]  traMADol (ULTRAM) 50 MG tablet Take 50 mg by mouth 2 (two) times daily as needed (pain). 05/04/21  Yes [provider]  benzonatate (TESSALON) 100 MG capsule Take 200 mg by mouth 3 (three) times daily. Patient not taking: Reported on 06/08/2021 05/16/21   [provider]  docusate sodium (  COLACE) 100 MG capsule Take 1 capsule (100 mg total) by mouth daily. Patient not taking: Reported on 06/08/2021 05/15/19   Georgette Shell, MD  DULoxetine (CYMBALTA) 20 MG capsule Take 1 capsule (20 mg total) by mouth daily. Patient not taking: Reported on 06/08/2021 08/17/13   Gardenia Phlegm, NP  famotidine (PEPCID) 20 MG tablet Take 20 mg by  mouth 2 (two) times daily. Patient not taking: Reported on 06/08/2021 05/28/21   [provider]  furosemide (LASIX) 20 MG tablet Take 20 mg by mouth 2 (two) times daily. Patient not taking: Reported on 06/08/2021 03/16/21   [provider]  metFORMIN (GLUCOPHAGE) 500 MG tablet Take 1 tablet (500 mg total) by mouth 2 (two) times daily with a meal. Patient not taking: Reported on 06/08/2021 12/09/15   [name], MD     Vital Signs: BP 133/63 (BP Location: Right Arm)    Pulse 95    Temp 98.2 F (36.8 C) (Oral)    Resp 16    Ht 5\' 5"  (1.651 m)    Wt 300 lb (136.1 kg)    SpO2 98%    BMI 49.92 kg/m   Physical Exam awake/talkative; puncture site L1 region clean and dry,NT, no hematoma  Imaging: DG Chest Port 1 View  Result Date: 06/16/2021 CLINICAL DATA:  Preoperative assessment EXAM: PORTABLE CHEST 1 VIEW COMPARISON:  06/06/2021 FINDINGS: Elevation of the right diaphragm. Low lung volumes. Mild bilateral interstitial thickening. No focal consolidation. No pleural effusion or pneumothorax. Stable cardiomediastinal silhouette. No acute osseous abnormality. IMPRESSION: Low lung volumes with interstitial prominence which may be secondary to hypoinflation and crowding of the interstitial markings versus mild pulmonary vascular congestion. Electronically Signed   By: Kathreen Devoid M.D.   On: 06/16/2021 08:39   IR KYPHO LUMBAR INC FX REDUCE BONE BX UNI/BIL CANNULATION INC/IMAGING  Result Date: 06/17/2021 INDICATION: 81 year old female referred for treatment of symptomatic L1 compression fracture EXAM: IMAGE GUIDED KYPHOPLASTY L1 COMPARISON:  MR 06/06/2021 MEDICATIONS: As antibiotic prophylaxis, 2 g Ancef was ordered pre-procedure and administered intravenously within 1 hour of incision. ANESTHESIA/SEDATION: The anesthesia team was present to provide general endotracheal tube anesthesia and for patient monitoring during the procedure. Intubation was performed in interventional suite.  Interventional radiology nursing staff was also present. FLUOROSCOPY TIME:  Fluoroscopy Time: 5 minutes 30 seconds (119 mGy) COMPLICATIONS: None PROCEDURE: Following a full explanation of the procedure along with the potentially associated complications, a witnessed informed consent was obtained. Specific risks that were discussed included bleeding, infection, injury to adjacent structures, neurologic injury, embolization of cement within the veins, failure of the procedure to improve pain, need for further procedure/ surgery, cardiopulmonary collapse, death. The patient understands the risks and wishes to proceed. The patient was placed prone on the fluoroscopic table. Nasal oxygen was administered. Physiologic monitoring was performed throughout the duration of the procedure. The skin overlying the L1 region was prepped and draped in the usual sterile fashion. The L1 vertebral body was identified and the right pedicle was infiltrated with 1% lidocaine. This was then followed by the advancement of an 8-gauge Medtronic needle through the right pedicle into the midline of the vertebral body at the mid third on the lateral view. Inflation of the pedicular cannula balloon was then performed under fluoroscopic observation. Methylmethacrylate mixture was then reconstituted. Under biplane intermittent fluoroscopy, the methylmethacrylate was then injected into the L1 vertebral body with filling of the fracture cleft, good radiologic results. No extravasation was noted posteriorly into  the spinal canal. Scant posterior epidural contamination was visualized. The needles were then removed. Hemostasis was achieved at the skin entry site. There were no acute complications. Patient tolerated the procedure well. The patient was observed for 30 minutes and returned to her room in good condition. IMPRESSION: Status post image guided vertebral augmentation of the L1 vertebral body using kyphoplasty technique. Signed, Dulcy Fanny.  Dellia Nims, RPVI [facility] Radiology Electronically Signed   By: Corrie Mckusick D.O.   On: 06/17/2021 17:17    Labs:  CBC: Recent Labs    06/14/21 0538 06/15/21 0528 06/17/21 0527 06/18/21 0457  WBC 7.3 8.4 7.3 8.4  HGB 11.0* 10.8* 11.3* 11.1*  HCT 37.4 37.2 39.9 36.8  PLT 273 265 253 258    COAGS: Recent Labs    06/09/21 1100 06/12/21 0459  INR  --  1.1  APTT 31  --     BMP: Recent Labs    06/10/21 0502 06/12/21 0459 06/13/21 0539 06/17/21 0527  NA 136 136 138 138  K 4.6 4.2 4.1 4.4  CL 102 100 99 100  CO2 27 29 30 31   GLUCOSE 154* 110* 98 128*  BUN 18 16 15 20   CALCIUM 9.1 9.7 9.8 9.9  CREATININE 1.34* 1.26* 1.33* 1.18*  GFRNONAA 41* 44* 41* 47*    LIVER FUNCTION TESTS: Recent Labs    07/09/20 1307 06/06/21 2002  BILITOT 0.8 1.0  AST 17 28  ALT 17 16  ALKPHOS 46 46  PROT 6.6 7.5  ALBUMIN 3.3* 3.5    Assessment and Plan: Pt with hx symptomatic L1 compression fracture, s/p L1 KP on 06/17/21; stable; afebrile; WBC nl; hgb stable; denies worsening back pain; access site ok; PT/OT eval   Electronically Signed: D. Rowe Robert, PA-C 06/18/2021, 10:52 AM   I spent a total of 15 Minutes at the the patient's bedside AND on the patient's hospital floor or unit, greater than 50% of which was counseling/coordinating care for L1 kyphoplasty    Patient ID: [name], female   DOB: 01-Feb-1941, 81 y.o.   MRN: [id number]

## 2021-06-18 NOTE — Progress Notes (Signed)
PROGRESS NOTE    [name]  WFU:[id number] DOB: 27-Aug-1940 DOA: 06/06/2021 PCP: Willey Blade, MD   Brief Narrative:   [name] is a 81 y.o. female with medical history of diabetes type 2, hypertension, rheumatoid arthritis, breast and uterine cancer, chronic back pain, hyperlipidemia presented to the hospital with low back pain.  Patient is pretty much wheelchair-bound for the last 3 years and is currently at assisted living facility.  Patient was trying to reach out to the therapist when she felt a pop in her lower back and has been having pain since then.  MRI was obtained in the ED which showed the L1 inferior endplate fracture.  ED provider spoke with neurosurgery Dr. Zada Finders who recommended TSL0 versus and no surgical intervention.  In the ED, patient was also noted to have hyperkalemia and was on spironolactone as outpatient with a stage IIIb. At this time,  kyphoplasty under sedation on 06/17/21.  Assessment & Plan:  Closed L1 vertebral fracture:   Status post kyphoplasty under anesthesia on 06/17/2021 .  We will get PT evaluation after kyphoplasty.  Low-grade fever.   Resolved.     Acute metabolic encephalopathy:  Improved with likely component of sundowning as well.  off gabapentin since 06/16/2021.  Been taken off Dilaudid IV.  Hyperkalemia. Resolved.  Received Lokelma.  Diabetes mellitus type 2.   Controlled.  Continue sliding scale insulin, Accu-Cheks.  Latest POC glucose of 139.   CKD stage IIIb .   Latest creatinine of 1.1.  Check BMP in AM.  Essential hypertension  Continue metoprolol.  Spironolactone on hold due to hyperkalemia   Right buttocks stage II pressure injury  present on admission.  Continue wound care.  GERD:  Continue Pepcid  Neuropathy/chronic back pain:  On gabapentin and cymbalta.  Gabapentin has been on hold since 06/16/2021 due to some confusion issues.  Currently stable pain wise.   History of VTE/saddle pulmonary  embolism.  Patient has been started on Eliquis.  Hx of left breast cancer: Continue Aromasin.  Follows up with oncology as outpatient.  Depression/memory issues Continue welbutrin and aricept  Normocytic anemia:  Latest hemoglobin of 10.8.  DVT prophylaxis: Eliquis  Code Status: Full code  Family Communication:   I spoke with the patient's son on the phone on 06/16/2021  Disposition Plan:  Patient is from assisted living facility.  We will get PT evaluation.  Consultants:  Neurosurgery IR  Procedures:  None  Antimicrobials:  None   Subjective: Today, patient was seen and examined at bedside.  Denies overt pain.  Denies any nausea vomiting fever chills or rigor.  Wishes to sit on the bedside.  Objective: Vitals:   06/17/21 1627 06/17/21 1740 06/17/21 2138 06/18/21 0534  BP:  (!) 158/79 (!) 162/83 133/63  Pulse: 88 95 (!) 102 95  Resp:   18 16  Temp:   98.2 F (36.8 C) 98.2 F (36.8 C)  TempSrc:   Oral Oral  SpO2: 94% 97% 97% 98%  Weight:      Height:        Intake/Output Summary (Last 24 hours) at 06/18/2021 0930 Last data filed at 06/18/2021 0630 Gross per 24 hour  Intake 750 ml  Output 320 ml  Net 430 ml    Filed Weights   06/06/21 1852  Weight: 136.1 kg    Physical examination: General: Morbidly obese, not in obvious distress HENT:   No scleral pallor or icterus noted. Oral mucosa is moist.  Chest:  Diminished breath sounds bilaterally. No crackles or wheezes.  CVS: S1 &S2 heard. No murmur.  Regular rate and rhythm. Abdomen: Soft, nontender, nondistended.  Bowel sounds are heard.   Extremities: No cyanosis, clubbing or edema.  Peripheral pulses are palpable. Psych: Alert, awake and communicative, oriented to place and person. CNS:  No cranial nerve deficits.  Power equal in all extremities.   Skin: Warm and dry.  No rashes noted.   Data Reviewed: I have personally reviewed following labs and imaging studies  CBC: Recent Labs  Lab  06/13/21 0539 06/14/21 0538 06/15/21 0528 06/17/21 0527 06/18/21 0457  WBC 7.3 7.3 8.4 7.3 8.4  HGB 11.3* 11.0* 10.8* 11.3* 11.1*  HCT 38.7 37.4 37.2 39.9 36.8  MCV 92.6 92.6 92.8 95.2 91.8  PLT 300 273 265 253 505    Basic Metabolic Panel: Recent Labs  Lab 06/12/21 0459 06/13/21 0539 06/17/21 0527  NA 136 138 138  K 4.2 4.1 4.4  CL 100 99 100  CO2 29 30 31   GLUCOSE 110* 98 128*  BUN 16 15 20   CREATININE 1.26* 1.33* 1.18*  CALCIUM 9.7 9.8 9.9  MG 2.2  --   --     GFR: Estimated Creatinine Clearance: 55 mL/min (A) (by C-G formula based on SCr of 1.18 mg/dL (H)). Liver Function Tests: No results for input(s): AST, ALT, ALKPHOS, BILITOT, PROT, ALBUMIN in the last 168 hours.  No results for input(s): LIPASE, AMYLASE in the last 168 hours. No results for input(s): AMMONIA in the last 168 hours. Coagulation Profile: Recent Labs  Lab 06/12/21 0459  INR 1.1    Cardiac Enzymes: No results for input(s): CKTOTAL, CKMB, CKMBINDEX, TROPONINI in the last 168 hours. BNP (last 3 results) No results for input(s): PROBNP in the last 8760 hours. HbA1C: No results for input(s): HGBA1C in the last 72 hours. CBG: Recent Labs  Lab 06/16/21 2102 06/17/21 0759 06/17/21 1706 06/17/21 2141 06/18/21 0744  GLUCAP 110* 150* 136* 183* 139*    Lipid Profile: No results for input(s): CHOL, HDL, LDLCALC, TRIG, CHOLHDL, LDLDIRECT in the last 72 hours. Thyroid Function Tests: No results for input(s): TSH, T4TOTAL, FREET4, T3FREE, THYROIDAB in the last 72 hours. Anemia Panel: No results for input(s): VITAMINB12, FOLATE, FERRITIN, TIBC, IRON, RETICCTPCT in the last 72 hours. Sepsis Labs: No results for input(s): PROCALCITON, LATICACIDVEN in the last 168 hours.   Recent Results (from the past 240 hour(s))  Surgical PCR screen     Status: None   Collection Time: 06/11/21 11:09 PM   Specimen: Nasal Mucosa; Nasal Swab  Result Value Ref Range Status   MRSA, PCR NEGATIVE NEGATIVE  Final   Staphylococcus aureus NEGATIVE NEGATIVE Final    Comment: (NOTE) The Xpert SA Assay (FDA approved for NASAL specimens in patients 26 years of age and older), is one component of a comprehensive surveillance program. It is not intended to diagnose infection nor to guide or monitor treatment. Performed at [facility], [place] [address], [place], [place] [address]        Radiology Studies: IR KYPHO LUMBAR INC FX REDUCE BONE BX UNI/BIL CANNULATION INC/IMAGING  Result Date: 06/17/2021 INDICATION: 81 year old female referred for treatment of symptomatic L1 compression fracture EXAM: IMAGE GUIDED KYPHOPLASTY L1 COMPARISON:  MR 06/06/2021 MEDICATIONS: As antibiotic prophylaxis, 2 g Ancef was ordered pre-procedure and administered intravenously within 1 hour of incision. ANESTHESIA/SEDATION: The anesthesia team was present to provide general endotracheal tube anesthesia and for patient monitoring during the procedure. Intubation was  performed in interventional suite. Interventional radiology nursing staff was also present. FLUOROSCOPY TIME:  Fluoroscopy Time: 5 minutes 30 seconds (935 mGy) COMPLICATIONS: None PROCEDURE: Following a full explanation of the procedure along with the potentially associated complications, a witnessed informed consent was obtained. Specific risks that were discussed included bleeding, infection, injury to adjacent structures, neurologic injury, embolization of cement within the veins, failure of the procedure to improve pain, need for further procedure/ surgery, cardiopulmonary collapse, death. The patient understands the risks and wishes to proceed. The patient was placed prone on the fluoroscopic table. Nasal oxygen was administered. Physiologic monitoring was performed throughout the duration of the procedure. The skin overlying the L1 region was prepped and draped in the usual sterile fashion. The L1 vertebral body was identified and the right pedicle  was infiltrated with 1% lidocaine. This was then followed by the advancement of an 8-gauge Medtronic needle through the right pedicle into the midline of the vertebral body at the mid third on the lateral view. Inflation of the pedicular cannula balloon was then performed under fluoroscopic observation. Methylmethacrylate mixture was then reconstituted. Under biplane intermittent fluoroscopy, the methylmethacrylate was then injected into the L1 vertebral body with filling of the fracture cleft, good radiologic results. No extravasation was noted posteriorly into the spinal canal. Scant posterior epidural contamination was visualized. The needles were then removed. Hemostasis was achieved at the skin entry site. There were no acute complications. Patient tolerated the procedure well. The patient was observed for 30 minutes and returned to her room in good condition. IMPRESSION: Status post image guided vertebral augmentation of the L1 vertebral body using kyphoplasty technique. Signed, Dulcy Fanny. Dellia Nims, RPVI [facility] Electronically Signed   By: Corrie Mckusick D.O.   On: 06/17/2021 17:17    Scheduled Meds:  acidophilus  1 capsule Oral Daily   apixaban  5 mg Oral BID   vitamin C  250 mg Oral BID   benzonatate  200 mg Oral TID   bisacodyl  10 mg Rectal Daily   buPROPion  150 mg Oral BID   docusate sodium  100 mg Oral Daily   donepezil  5 mg Oral Daily   DULoxetine  20 mg Oral Daily   exemestane  25 mg Oral Daily   famotidine  20 mg Oral BID   gabapentin  200 mg Oral BID   insulin aspart  0-15 Units Subcutaneous TID WC & HS   linaclotide  72 mcg Oral q morning   metoprolol succinate  50 mg Oral BID   multivitamin with minerals  1 tablet Oral Daily   polyethylene glycol  17 g Oral Daily   Ensure Max Protein  11 oz Oral BID   senna-docusate  1 tablet Oral BID   zinc sulfate  220 mg Oral Daily   Continuous Infusions:    LOS: 11 days     Flora Lipps, MD [facility] If 7PM-7AM, please contact night-coverage [email or web address] 06/18/2021, 9:30 AM

## 2021-06-18 NOTE — Evaluation (Signed)
Physical Therapy Re-Evaluation Patient Details Name: [name] MRN: [id number] DOB: February 14, 1941 Today's Date: 06/18/2021  History of Present Illness  Patient is 81 y.o. female with L1 endplate fracture. Neurosurgery who recommended nonsurgical intervention with TLSO and follow-up with neurosurgery. Per chart patient has been wheelchair-bound for the last 3 years but has been able to stand up to get in and out of bed to wheelchair. Pt was working with PT at facility for transfers until ~4 weeks ago. Patient now s/p kyphoplasty under sedation on 06/17/21.    Clinical Impression  [name] is 81 y.o. female admitted with above HPI and diagnosis. Patient is currently limited by functional impairments below (see PT problem list). Patient admitted from [facility] where she has required Total Assist for bed<>wheelchair transfers via hoyer lift PTA and Total Assist for ADL's at baseline. Patient re-evaluated by Physical Therapy after kyphoplasty on 1./25 for L1 fracture and has had no change in functional status/mobility. She remains at her baseline mobility level and has no further acute PT needs identified. All education has been completed and the patient has no further questions. Recommend she return to [facility]F to receive ongoing 24/7 assist as she was receiving PTA. See below for any follow-up Physical Therapy or equipment needs. PT is signing off. Thank you for this referral.        Recommendations for follow up therapy are one component of a multi-disciplinary discharge planning process, led by the attending physician.  Recommendations may be updated based on patient status, additional functional criteria and insurance authorization.  Follow Up Recommendations No PT follow up (Return to ALF)    Assistance Recommended at Discharge Frequent or constant Supervision/Assistance  Patient can return home with the following       Equipment Recommendations None  recommended by PT  Recommendations for Other Services       Functional Status Assessment Patient has not had a recent decline in their functional status     Precautions / Restrictions Precautions Precautions: Fall;Back Precaution Booklet Issued: No Precaution Comments: s/p kyphoplasty Restrictions Weight Bearing Restrictions: No      Mobility  Bed Mobility Overal bed mobility: Needs Assistance Bed Mobility: Rolling, Sidelying to Sit, Sit to Supine Rolling: Max assist, +2 for physical assistance, +2 for safety/equipment Sidelying to sit: Max assist, Total assist, +2 for physical assistance, +2 for safety/equipment   Sit to supine: Max assist, Total assist, +2 for physical assistance, +2 for safety/equipment   General bed mobility comments: Pt required assist to reach for bed rail and attempted to initiate reach and bringing LE's off EOB but unable to do so. Pt required MAX assist to roll to side and to press up trunk and sit EOB. pt required Max support posteriorly to maintain sitting balance at EOB. Max/Total +2 assist to return to supine and reposition in bed.    Transfers                   General transfer comment: is at baseline with need for mechanical lift to transfer OOB.    Ambulation/Gait                  Stairs            Wheelchair Mobility    Modified Rankin (Stroke Patients Only)       Balance Overall balance assessment: Needs assistance Sitting-balance support: Feet supported Sitting balance-Leahy Scale: Good     Standing balance support: Reliant on  assistive device for balance, During functional activity, Bilateral upper extremity supported                                 Pertinent Vitals/Pain Pain Assessment Pain Assessment: Faces Faces Pain Scale: Hurts even more Pain Location: back pain with mobilty Pain Descriptors / Indicators: Grimacing, Moaning Pain Intervention(s): Limited activity within patient's  tolerance, Repositioned    Home Living Family/patient expects to be discharged to:: Assisted living Living Arrangements: Other (Comment)                 Additional Comments: pt has wheelchair, mechanical lift, RW, hospital bed.    Prior Function Prior Level of Function : Patient poor historian/Family not available             Mobility Comments: per chart reviewe 2 years ago pt was 2+ Max Assist to attempt stand with Stedy. Pt has been primarily wheelchair bound for ~3 years and the staff at [facility] uses a hoyer lift to transfer pt in and out of bed to wheelchair at baseline. ADLs Comments: Total Assist     Hand Dominance        Extremity/Trunk Assessment   Upper Extremity Assessment Upper Extremity Assessment: Generalized weakness    Lower Extremity Assessment Lower Extremity Assessment: Generalized weakness    Cervical / Trunk Assessment Cervical / Trunk Assessment: Other exceptions Cervical / Trunk Exceptions: L1 fracture s/p kyphoplasty on 1/25  Communication   Communication: No difficulties  Cognition Arousal/Alertness: Awake/alert Behavior During Therapy: WFL for tasks assessed/performed Overall Cognitive Status: No family/caregiver present to determine baseline cognitive functioning                                          General Comments      Exercises     Assessment/Plan    PT Assessment Patient does not need any further PT services  PT Problem List         PT Treatment Interventions      PT Goals (Current goals can be found in the Care Plan section)  Acute Rehab PT Goals Patient Stated Goal: na PT Goal Formulation: All assessment and education complete, DC therapy Time For Goal Achievement: 06/18/21 Potential to Achieve Goals: Poor    Frequency       Co-evaluation               AM-PAC PT "6 Clicks" Mobility  Outcome Measure Help needed turning from your back to your side while in a flat bed  without using bedrails?: Total Help needed moving from lying on your back to sitting on the side of a flat bed without using bedrails?: Total Help needed moving to and from a bed to a chair (including a wheelchair)?: Total Help needed standing up from a chair using your arms (e.g., wheelchair or bedside chair)?: Total Help needed to walk in hospital room?: Total Help needed climbing 3-5 steps with a railing? : Total 6 Click Score: 6    End of Session   Activity Tolerance: Patient tolerated treatment well;Patient limited by pain Patient left: in bed;with call bell/phone within reach;with bed alarm set Nurse Communication: Mobility status;Need for lift equipment PT Visit Diagnosis: Muscle weakness (generalized) (M62.81);Difficulty in walking, not elsewhere classified (R26.2);Other abnormalities of gait and mobility (R26.89);Pain    Time:  0926-0949 PT Time Calculation (min) (ACUTE ONLY): 23 min   Charges:   PT Evaluation $PT Re-evaluation: 1 Re-eval PT Treatments $Therapeutic Activity: 8-22 mins        Verner Mould, DPT Acute Rehabilitation Services Office [phone] Pager [phone]   Jacques Navy 06/18/2021, 12:19 PM

## 2021-06-19 ENCOUNTER — Inpatient Hospital Stay (HOSPITAL_COMMUNITY): Payer: Medicare Other

## 2021-06-19 LAB — GLUCOSE, CAPILLARY
Glucose-Capillary: 182 mg/dL — ABNORMAL HIGH (ref 70–99)
Glucose-Capillary: 156 mg/dL — ABNORMAL HIGH (ref 70–99)
Glucose-Capillary: 147 mg/dL — ABNORMAL HIGH (ref 70–99)
Glucose-Capillary: 156 mg/dL — ABNORMAL HIGH (ref 70–99)
Glucose-Capillary: 211 mg/dL — ABNORMAL HIGH (ref 70–99)

## 2021-06-19 LAB — RESP PANEL BY RT-PCR (FLU A&B, COVID) ARPGX2
Influenza A by PCR: NEGATIVE
Influenza B by PCR: NEGATIVE
SARS Coronavirus 2 by RT PCR: NEGATIVE

## 2021-06-19 LAB — CBC
HCT: 36.4 % (ref 36.0–46.0)
Hemoglobin: 10.6 g/dL — ABNORMAL LOW (ref 12.0–15.0)
MCH: 26.9 pg (ref 26.0–34.0)
MCHC: 29.1 g/dL — ABNORMAL LOW (ref 30.0–36.0)
MCV: 92.4 fL (ref 80.0–100.0)
Platelets: 242 10*3/uL (ref 150–400)
RBC: 3.94 MIL/uL (ref 3.87–5.11)
RDW: 15.5 % (ref 11.5–15.5)
WBC: 7.8 10*3/uL (ref 4.0–10.5)
nRBC: 0 % (ref 0.0–0.2)

## 2021-06-19 MED ORDER — FUROSEMIDE 10 MG/ML IJ SOLN
40.0000 mg | Freq: Once | INTRAMUSCULAR | Status: AC
Start: 2021-06-19 — End: 2021-06-19
  Administered 2021-06-19: 40 mg via INTRAVENOUS
  Filled 2021-06-19: qty 4

## 2021-06-19 MED ORDER — OXYCODONE-ACETAMINOPHEN 5-325 MG PO TABS: 1.0000 | ORAL_TABLET | Freq: Four times a day (QID) | ORAL | 0 refills | Status: DC | PRN

## 2021-06-19 MED ORDER — OXYCODONE-ACETAMINOPHEN 5-325 MG PO TABS: 1.0000 | ORAL_TABLET | Freq: Four times a day (QID) | ORAL | 0 refills | Status: AC | PRN

## 2021-06-19 NOTE — Progress Notes (Signed)
SATURATION QUALIFICATIONS: (This note is used to comply with regulatory documentation for home oxygen)  Patient Saturations on Room Air at Rest = 88%  Please briefly explain why patient needs home oxygen: Patient needs 2L at rest to maintain saturations

## 2021-06-19 NOTE — [place] FL2 (Signed)
[place] MEDICAID FL2 LEVEL OF CARE SCREENING TOOL     IDENTIFICATION  Patient Name: [name] Birthdate: August 19, 1940 Sex: female Admission Date (Current Location): 06/06/2021  [facility] and [place] Number:  Herbalist and Address:  [facility],  [place], [place]      Provider Number: [id number]  Attending Physician Name and Address:  Flora Lipps, MD  Relative Name and Phone Number:       Current Level of Care: Hospital Recommended Level of Care: [place] Prior Approval Number:    Date Approved/Denied:   PASRR Number:    Discharge Plan: Other (Comment)    Current Diagnoses: Patient Active Problem List   Diagnosis Date Noted   Closed L1 vertebral fracture ([place]) 06/07/2021   Lumbago 06/07/2021   Hyperkalemia 06/07/2021   Pressure injury of skin 06/07/2021   Fecal impaction ([place])    UTI (urinary tract infection) 05/11/2019   Constipation    Acute cystitis 05/10/2019   CKD (chronic kidney disease), stage III ([place]) 05/10/2019   Weakness 05/09/2019   Pulmonary embolus ([place])    Pulmonary embolus, left (HCC)    Pulmonary embolus, right (HCC)    CKD (chronic kidney disease), stage I    Bilateral pulmonary embolism ([place])    PE (pulmonary embolism)    Acute kidney injury superimposed on chronic kidney disease ([place]) 11/30/2015   Depression 11/30/2015   Essential hypertension 11/30/2015   Acute respiratory failure with hypoxia ([place]) 11/30/2015   Diabetes mellitus type 2 in obese ([place]) 11/30/2015   Pulmonary embolism ([place]) 11/29/2015   Pulmonary embolism, bilateral ([place]) 11/29/2015   Pain in lower limb 01/10/2015   Malignant neoplasm of upper-outer quadrant of left breast in female, estrogen receptor positive ([place]) 01/24/2013   Edema 01/09/2013   Onychomycosis 01/09/2013   Pain in joint, ankle and foot 01/09/2013    Orientation RESPIRATION BLADDER Height & Weight     Self  Normal Incontinent Weight:  136.1 kg Height:  5\' 5"  (165.1 cm)  BEHAVIORAL SYMPTOMS/MOOD NEUROLOGICAL BOWEL NUTRITION STATUS      Incontinent Diet (carb modified)  AMBULATORY STATUS COMMUNICATION OF NEEDS Skin   Total Care Verbally Surgical wounds                       Personal Care Assistance Level of Assistance  Bathing, Feeding, Dressing Bathing Assistance: Limited assistance Feeding assistance: Limited assistance Dressing Assistance: Limited assistance     Functional Limitations Info  Sight, Hearing, Speech Sight Info: Adequate Hearing Info: Adequate Speech Info: Adequate    SPECIAL CARE FACTORS FREQUENCY  PT (By licensed PT), OT (By licensed OT)     PT Frequency: 2 x weekly OT Frequency: 2 x weekly            Contractures Contractures Info: Not present    Additional Factors Info  Code Status, Allergies Code Status Info: Full Allergies Info: Iodine           Current Medications (06/19/2021):  This is the current hospital active medication list Current Facility-Administered Medications  Medication Dose Route Frequency Provider Last Rate Last Admin   acetaminophen (TYLENOL) tablet 650 mg  650 mg Oral Q6H PRN Chotiner, Yevonne Aline, MD   650 mg at 06/12/21 0301   Or   acetaminophen (TYLENOL) suppository 650 mg  650 mg Rectal Q6H PRN Chotiner, Yevonne Aline, MD       acidophilus (RISAQUAD) capsule 1 capsule  1 capsule Oral  Daily Pokhrel, Laxman, MD   1 capsule at 06/19/21 0946   albuterol (PROVENTIL) (2.5 MG/3ML) 0.083% nebulizer solution 3 mL  3 mL Inhalation Q4H PRN Chotiner, Yevonne Aline, MD       apixaban (ELIQUIS) tablet 5 mg  5 mg Oral BID Pokhrel, Laxman, MD   5 mg at 06/19/21 4097   ascorbic acid (VITAMIN C) tablet 250 mg  250 mg Oral BID Pokhrel, Laxman, MD   250 mg at 06/19/21 0946   benzonatate (TESSALON) capsule 200 mg  200 mg Oral TID Pokhrel, Laxman, MD   200 mg at 06/19/21 0946   bisacodyl (DULCOLAX) suppository 10 mg  10 mg Rectal Daily Pokhrel, Laxman, MD   10 mg at 06/19/21 1001    buPROPion (WELLBUTRIN SR) 12 hr tablet 150 mg  150 mg Oral BID Pokhrel, Laxman, MD   150 mg at 06/19/21 0947   docusate sodium (COLACE) capsule 100 mg  100 mg Oral Daily Pokhrel, Laxman, MD   100 mg at 06/19/21 0946   donepezil (ARICEPT) tablet 5 mg  5 mg Oral Daily Pokhrel, Laxman, MD   5 mg at 06/19/21 0947   DULoxetine (CYMBALTA) DR capsule 20 mg  20 mg Oral Daily Pokhrel, Laxman, MD   20 mg at 06/19/21 0948   exemestane (AROMASIN) tablet 25 mg  25 mg Oral Daily Pokhrel, Laxman, MD   25 mg at 06/19/21 0947   famotidine (PEPCID) tablet 20 mg  20 mg Oral BID Pokhrel, Laxman, MD   20 mg at 06/19/21 0946   gabapentin (NEURONTIN) capsule 200 mg  200 mg Oral BID Pokhrel, Laxman, MD   200 mg at 06/19/21 0946   insulin aspart (novoLOG) injection 0-15 Units  0-15 Units Subcutaneous TID WC & HS Chotiner, Yevonne Aline, MD   5 Units at 06/19/21 1233   linaclotide (LINZESS) capsule 72 mcg  72 mcg Oral q morning Pokhrel, Laxman, MD   72 mcg at 06/19/21 0948   metoprolol succinate (TOPROL-XL) 24 hr tablet 50 mg  50 mg Oral BID Chotiner, Yevonne Aline, MD   50 mg at 06/19/21 0955   multivitamin with minerals tablet 1 tablet  1 tablet Oral Daily Pokhrel, Laxman, MD   1 tablet at 06/19/21 0946   ondansetron (ZOFRAN) injection 4 mg  4 mg Intravenous Q6H PRN Arrien, Jimmy Picket, MD   4 mg at 06/12/21 1221   oxyCODONE-acetaminophen (PERCOCET/ROXICET) 5-325 MG per tablet 1 tablet  1 tablet Oral Q6H PRN Pokhrel, Laxman, MD   1 tablet at 06/18/21 0829   polyethylene glycol (MIRALAX / GLYCOLAX) packet 17 g  17 g Oral Daily Pokhrel, Laxman, MD   17 g at 06/19/21 0945   protein supplement (ENSURE MAX) liquid  11 oz Oral BID Pokhrel, Laxman, MD   11 oz at 06/15/21 0900   senna-docusate (Senokot-S) tablet 1 tablet  1 tablet Oral QHS PRN Chotiner, Yevonne Aline, MD       senna-docusate (Senokot-S) tablet 1 tablet  1 tablet Oral BID Pokhrel, Laxman, MD   1 tablet at 06/19/21 0946   zinc sulfate capsule 220 mg  220 mg Oral Daily  Pokhrel, Laxman, MD   220 mg at 06/19/21 0947     Discharge Medications:      Medication List       STOP taking these medications     ibuprofen 600 MG tablet Commonly known as: ADVIL    tiZANidine 4 MG tablet Commonly known as: ZANAFLEX    traMADol 50 MG  tablet Commonly known as: ULTRAM           TAKE these medications     acetaminophen 500 MG tablet Commonly known as: TYLENOL Take 1,000 mg by mouth 2 (two) times daily.    albuterol 108 (90 Base) MCG/ACT inhaler Commonly known as: VENTOLIN HFA Inhale 2 puffs into the lungs every 4 (four) hours as needed (cough).    apixaban 5 MG Tabs tablet Commonly known as: ELIQUIS Take 5 mg by mouth 2 (two) times daily.    benzonatate 100 MG capsule Commonly known as: TESSALON Take 200 mg by mouth 3 (three) times daily.    Biofreeze Roll-On 4 % Gel Generic drug: Menthol (Topical Analgesic) Apply 1 application topically 2 (two) times daily as needed (knee pain).    bisacodyl 10 MG suppository Commonly known as: DULCOLAX Place 10 mg rectally daily as needed (constipation).    buPROPion 150 MG 12 hr tablet Commonly known as: WELLBUTRIN SR Take 150 mg by mouth 3 (three) times daily.    Calmoseptine 0.44-20.6 % Oint Generic drug: Menthol-Zinc Oxide Apply 1 application topically 3 (three) times daily. Every shift    docusate sodium 100 MG capsule Commonly known as: COLACE Take 1 capsule (100 mg total) by mouth daily.    donepezil 5 MG tablet Commonly known as: ARICEPT Take 5 mg by mouth every morning.    DULoxetine 20 MG capsule Commonly known as: CYMBALTA Take 1 capsule (20 mg total) by mouth daily.    Eszopiclone 3 MG Tabs Take 3 mg by mouth at bedtime.    exemestane 25 MG tablet Commonly known as: AROMASIN Take 1 tablet by mouth once daily What changed:  how to take this when to take this    famotidine 20 MG tablet Commonly known as: PEPCID Take 20 mg by mouth 2 (two) times daily.    fenofibrate 160  MG tablet Take 160 mg by mouth every evening.    furosemide 20 MG tablet Commonly known as: LASIX Take 20 mg by mouth 2 (two) times daily.    gabapentin 300 MG capsule Commonly known as: NEURONTIN TAKE 1 CAPSULE BY MOUTH AT BEDTIME    glipiZIDE-metformin 2.5-500 MG tablet Commonly known as: METAGLIP Take 1 tablet by mouth 2 (two) times daily.    HYDROcodone bit-homatropine 5-1.5 MG/5ML syrup Commonly known as: HYCODAN 5 mLs every 12 (twelve) hours as needed for cough.    Linzess 72 MCG capsule Generic drug: linaclotide Take 72 mcg by mouth every morning.    metFORMIN 500 MG tablet Commonly known as: GLUCOPHAGE Take 1 tablet (500 mg total) by mouth 2 (two) times daily with a meal.    metoprolol succinate 50 MG 24 hr tablet Commonly known as: TOPROL-XL Take 50 mg by mouth every morning.    multivitamin with minerals Tabs tablet Take 1 tablet by mouth every morning.    oxyCODONE-acetaminophen 5-325 MG tablet Commonly known as: PERCOCET/ROXICET Take 1 tablet by mouth every 6 (six) hours as needed for moderate pain or severe pain.    Restora Caps Take 1 capsule by mouth at bedtime.    Senna-Plus 8.6-50 MG tablet Generic drug: senna-docusate Take 2 tablets by mouth every morning.    spironolactone 50 MG tablet Commonly known as: ALDACTONE Take 50 mg by mouth every morning.       Relevant Imaging Results:  Relevant Lab Results:   Additional Information [id number]  Eldred Lievanos, Marjie Skiff, RN

## 2021-06-19 NOTE — TOC Transition Note (Signed)
Transition of Care [facility]) - CM/SW Discharge Note   Patient Details  Name: [name] MRN: [id number] Date of Birth: 1941-03-23  [facility] CM/SW Contact:  Lynnell Catalan, RN Phone Number: 06/19/2021, 2:57 PM   Clinical Narrative:    Pt to dc back to [facility] today. She will need home 02 at dc. [employer] liaison contacted for 02 referral. Per liaison they will have the 02 delivered to [facility] by 5pm. [facility] has been call for transport and they know to come after 5pm. RN to call report to [phone].  Final next level of care: Assisted Living Barriers to Discharge: Continued Medical Work up   Readmission Risk Interventions Readmission Risk Prevention Plan 06/10/2021  Transportation Screening Complete  PCP or Specialist Appt within 5-7 Days Complete  Home Care Screening Complete  Medication Review (RN CM) Complete  Some recent data might be hidden

## 2021-06-19 NOTE — Progress Notes (Signed)
Report given to [facility], Wells Guiles RN. AVS printed and new prescription given to the son. Awaiting ambulance transfer

## 2021-06-19 NOTE — Discharge Summary (Signed)
Physician Discharge Summary  [name] BOF:[id number] DOB: 1940-11-10 DOA: 06/06/2021  PCP: Willey Blade, MD  Admit date: 06/06/2021 Discharge date: 06/19/2021  Admitted From: Assisted living facility.  Discharge disposition: Assisted living facility  Recommendations for Outpatient Follow-Up:   Follow up with your primary care provider in one week.  Check CBC, BMP, magnesium in the next visit Please try to avoid strong sedative-hypnotics for the patient due to episodes of confusion. 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  Diet recommendation: Low sodium, heart healthy.  Carbohydrate-modified.    Wound care: None.  Code status: Full.   History of Present Illness:   [name] is a 81 y.o. female with medical history of diabetes type 2, hypertension, rheumatoid arthritis, breast and uterine cancer, chronic back pain, hyperlipidemia presented to the hospital with low back pain.  Patient is pretty much wheelchair-bound for the last 3 years and is currently at assisted living facility.  Patient was trying to reach out to the therapist when she felt a pop in her lower back and has been having pain since then.  MRI was obtained in the ED which showed the L1 inferior endplate fracture.  ED provider spoke with neurosurgery Dr. Zada Finders who recommended TSL0 versus and no surgical intervention.  Patient was then admitted hospital for further evaluation and treatment.  Hospital Course:   Following conditions were addressed during hospitalization as listed below,  Closed L1  vertebral fracture:   Patient could not tolerate TSLO brace with analgesics.  Interventional radiology was then consulted.  She underwent  kyphoplasty under anesthesia on 06/17/2021 .  Physical therapy has evaluated after kyphoplasty who recommended continuation of current level of care since she was at baseline.   Low-grade fever.   During hospitalization which has resolved at this time.  Received Short course of antibiotic as well.   Acute metabolic encephalopathy:  Resolved at this time.  Patient has her baseline.  She had some confusion with IV Dilaudid.  Has been tolerating Percocet for pain.  Would benefit from trying to to avoid stronger narcotics.   Hyperkalemia. Resolved.  Received Lokelma during hospitalization.  Potassium of 4.0.  Will need to monitor potassium levels on spironolactone from home.   Diabetes mellitus type 2.   Controlled.  Continue sliding scale insulin, Accu-Cheks.  Latest POC glucose of 139.   CKD stage IIIb .   Latest creatinine of 1.1.  He is on a diuretic at home which will be continued on discharge.   Essential hypertension  Continue metoprolol.  Spironolactone on hold due to hyperkalemia   Right buttocks stage II pressure injury  present on admission.  Continue wound care.   GERD:  Continue Pepcid   Neuropathy/chronic back pain:  On gabapentin and cymbalta.   History of VTE/saddle pulmonary embolism.  Patient has been started on Eliquis.   Hx of left breast cancer: Continue Aromasin.  Follows up with oncology as outpatient.   Depression/memory issues Continue welbutrin and aricept   Normocytic anemia:  Latest hemoglobin of 10.8.  Episode of hypoxia during hospitalization.  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  X-ray 06/19/2021 showed some hemidiaphragm elevation.  Patient is on Lasix and spironolactone from home which will be resumed on discharge.  Received 1 dose of  IV Lasix today.  Would benefit from incentive spirometry, supplemental oxygen as needed.  Disposition.  At this time, patient is stable for disposition to assisted living facility with outpatient PCP follow-up.  Medical Consultants:   Interventional radiology Neurosurgery  Procedures:    None Subjective:   Today, patient was seen and examined at bedside.  Denies overt pain.  Denies any nausea, vomiting fever or chills.  Discharge Exam:   Vitals:   06/18/21 2020 06/19/21 0504  BP: 137/60 139/60  Pulse: 93 87  Resp: 14 18  Temp: 98 F (36.7 C) 98.3 F (36.8 C)  SpO2: 96% 91%   Vitals:   06/18/21 0534 06/18/21 1344 06/18/21 2020 06/19/21 0504  BP: 133/63 (!) 145/60 137/60 139/60  Pulse: 95 87 93 87  Resp: 16 18 14 18   Temp: 98.2 F (36.8 C) 99 F (37.2 C) 98 F (36.7 C) 98.3 F (36.8 C)  TempSrc: Oral Oral Oral Oral  SpO2: 98% 96% 96% 91%  Weight:      Height:       General: Morbidly obese, not in obvious distress HENT: pupils equally reacting to light,  No scleral pallor or icterus noted. Oral mucosa is moist.  Chest:  Diminished breath sounds bilaterally.  CVS: S1 &S2 heard. No murmur.  Regular rate and rhythm. Abdomen: Soft, nontender, nondistended.  Bowel sounds are heard.   Extremities: No cyanosis, clubbing or edema.  Peripheral pulses are palpable. Psych: Alert, awake and communicative, oriented to place and person CNS:  No cranial nerve deficits.  Power equal in all extremities.   Skin: Warm and dry.  No rashes noted.  The results of significant diagnostics from this hospitalization (including imaging, microbiology, ancillary and laboratory) are listed below for reference.     Diagnostic Studies:   IR KYPHO LUMBAR INC FX REDUCE BONE BX UNI/BIL CANNULATION INC/IMAGING  Result Date: 06/17/2021 INDICATION: 81 year old female referred for treatment of symptomatic L1 compression fracture EXAM: IMAGE GUIDED KYPHOPLASTY L1 COMPARISON:  MR 06/06/2021  MEDICATIONS: As antibiotic prophylaxis, 2 g Ancef was ordered pre-procedure and administered intravenously within 1 hour of incision. ANESTHESIA/SEDATION: The anesthesia team was present to provide general endotracheal tube anesthesia and for patient monitoring during the procedure. Intubation was performed in interventional suite. Interventional radiology nursing staff was also present. FLUOROSCOPY TIME:  Fluoroscopy Time: 5 minutes 30 seconds (629 mGy) COMPLICATIONS: None PROCEDURE: Following a full explanation of the procedure along with the potentially associated complications, a witnessed informed consent was obtained. Specific risks that were discussed included bleeding, infection, injury to adjacent structures, neurologic injury, embolization of cement within the veins, failure of the procedure to improve pain, need for further procedure/ surgery, cardiopulmonary collapse, death. The patient understands the risks and wishes to proceed. The patient was placed prone on the fluoroscopic table. Nasal oxygen was administered. Physiologic monitoring was performed throughout the duration of the procedure. The skin overlying the L1 region was prepped and draped in the usual sterile fashion. The L1 vertebral body was identified and the right pedicle was infiltrated with 1% lidocaine. This was then followed by the advancement of an 8-gauge Medtronic needle through the right pedicle into the midline of the vertebral body at the mid third on the lateral view. Inflation of the pedicular cannula balloon was then  performed under fluoroscopic observation. Methylmethacrylate mixture was then reconstituted. Under biplane intermittent fluoroscopy, the methylmethacrylate was then injected into the L1 vertebral body with filling of the fracture cleft, good radiologic results. No extravasation was noted posteriorly into the spinal canal. Scant posterior epidural contamination was visualized. The needles were then removed.  Hemostasis was achieved at the skin entry site. There were no acute complications. Patient tolerated the procedure well. The patient was observed for 30 minutes and returned to her room in good condition. IMPRESSION: Status post image guided vertebral augmentation of the L1 vertebral body using kyphoplasty technique. Signed, Dulcy Fanny. Dellia Nims, RPVI [facility] Radiology Electronically Signed   By: Corrie Mckusick D.O.   On: 06/17/2021 17:17     Labs:   Basic Metabolic Panel: Recent Labs  Lab 06/13/21 0539 06/17/21 0527  NA 138 138  K 4.1 4.4  CL 99 100  CO2 30 31  GLUCOSE 98 128*  BUN 15 20  CREATININE 1.33* 1.18*  CALCIUM 9.8 9.9   GFR Estimated Creatinine Clearance: 55 mL/min (A) (by C-G formula based on SCr of 1.18 mg/dL (H)). Liver Function Tests: No results for input(s): AST, ALT, ALKPHOS, BILITOT, PROT, ALBUMIN in the last 168 hours. No results for input(s): LIPASE, AMYLASE in the last 168 hours. No results for input(s): AMMONIA in the last 168 hours. Coagulation profile No results for input(s): INR, PROTIME in the last 168 hours.  CBC: Recent Labs  Lab 06/14/21 0538 06/15/21 0528 06/17/21 0527 06/18/21 0457 06/19/21 0526  WBC 7.3 8.4 7.3 8.4 7.8  HGB 11.0* 10.8* 11.3* 11.1* 10.6*  HCT 37.4 37.2 39.9 36.8 36.4  MCV 92.6 92.8 95.2 91.8 92.4  PLT 273 265 253 258 242   Cardiac Enzymes: No results for input(s): CKTOTAL, CKMB, CKMBINDEX, TROPONINI in the last 168 hours. BNP: Invalid input(s): POCBNP CBG: Recent Labs  Lab 06/18/21 1214 06/18/21 1623 06/18/21 2021 06/18/21 2211 06/19/21 0810  GLUCAP 144* 182* 156* 147* 156*   D-Dimer No results for input(s): DDIMER in the last 72 hours. Hgb A1c No results for input(s): HGBA1C in the last 72 hours. Lipid Profile No results for input(s): CHOL, HDL, LDLCALC, TRIG, CHOLHDL, LDLDIRECT in the last 72 hours. Thyroid function studies No results for input(s): TSH,  T4TOTAL, T3FREE, THYROIDAB in the last 72 hours.  Invalid input(s): FREET3 Anemia work up No results for input(s): VITAMINB12, FOLATE, FERRITIN, TIBC, IRON, RETICCTPCT in the last 72 hours. Microbiology Recent Results (from the past 240 hour(s))  Surgical PCR screen     Status: None   Collection Time: 06/11/21 11:09 PM   Specimen: Nasal Mucosa; Nasal Swab  Result Value Ref Range Status   MRSA, PCR NEGATIVE NEGATIVE Final   Staphylococcus aureus NEGATIVE NEGATIVE Final    Comment: (NOTE) The Xpert SA Assay (FDA approved for NASAL specimens in patients 81 years of age and older), is one component of a comprehensive surveillance program. It is not intended to diagnose infection nor to guide or monitor treatment. Performed at [facility], [place] [address] [place], [place] [address]      Discharge Instructions:   Discharge Instructions     Call MD for:  severe uncontrolled pain   Complete by: As directed    Call MD for:  temperature >100.4   Complete by: As directed    Diet Carb Modified   Complete by: As directed    Discharge instructions   Complete by: As directed  Follow-up with your primary care provider in 1 week.  Continue physical therapy at the facility.  Seek medical attention for worsening symptoms.   Increase activity slowly   Complete by: As directed    No wound care   Complete by: As directed       Allergies as of 06/19/2021       Reactions   Iodine Rash        Medication List     STOP taking these medications    ibuprofen 600 MG tablet Commonly known as: ADVIL   tiZANidine 4 MG tablet Commonly known as: ZANAFLEX   traMADol 50 MG tablet Commonly known as: ULTRAM       TAKE these medications    acetaminophen 500 MG tablet Commonly known as: TYLENOL Take 1,000 mg by mouth 2 (two) times daily.   albuterol 108 (90 Base) MCG/ACT inhaler Commonly known as: VENTOLIN HFA Inhale 2 puffs into the lungs every 4 (four) hours  as needed (cough).   apixaban 5 MG Tabs tablet Commonly known as: ELIQUIS Take 5 mg by mouth 2 (two) times daily.   benzonatate 100 MG capsule Commonly known as: TESSALON Take 200 mg by mouth 3 (three) times daily.   Biofreeze Roll-On 4 % Gel Generic drug: Menthol (Topical Analgesic) Apply 1 application topically 2 (two) times daily as needed (knee pain).   bisacodyl 10 MG suppository Commonly known as: DULCOLAX Place 10 mg rectally daily as needed (constipation).   buPROPion 150 MG 12 hr tablet Commonly known as: WELLBUTRIN SR Take 150 mg by mouth 3 (three) times daily.   Calmoseptine 0.44-20.6 % Oint Generic drug: Menthol-Zinc Oxide Apply 1 application topically 3 (three) times daily. Every shift   docusate sodium 100 MG capsule Commonly known as: COLACE Take 1 capsule (100 mg total) by mouth daily.   donepezil 5 MG tablet Commonly known as: ARICEPT Take 5 mg by mouth every morning.   DULoxetine 20 MG capsule Commonly known as: CYMBALTA Take 1 capsule (20 mg total) by mouth daily.   Eszopiclone 3 MG Tabs Take 3 mg by mouth at bedtime.   exemestane 25 MG tablet Commonly known as: AROMASIN Take 1 tablet by mouth once daily What changed:  how to take this when to take this   famotidine 20 MG tablet Commonly known as: PEPCID Take 20 mg by mouth 2 (two) times daily.   fenofibrate 160 MG tablet Take 160 mg by mouth every evening.   furosemide 20 MG tablet Commonly known as: LASIX Take 20 mg by mouth 2 (two) times daily.   gabapentin 300 MG capsule Commonly known as: NEURONTIN TAKE 1 CAPSULE BY MOUTH AT BEDTIME   glipiZIDE-metformin 2.5-500 MG tablet Commonly known as: METAGLIP Take 1 tablet by mouth 2 (two) times daily.   HYDROcodone bit-homatropine 5-1.5 MG/5ML syrup Commonly known as: HYCODAN 5 mLs every 12 (twelve) hours as needed for cough.   Linzess 72 MCG capsule Generic drug: linaclotide Take 72 mcg by mouth every morning.   metFORMIN 500  MG tablet Commonly known as: GLUCOPHAGE Take 1 tablet (500 mg total) by mouth 2 (two) times daily with a meal.   metoprolol succinate 50 MG 24 hr tablet Commonly known as: TOPROL-XL Take 50 mg by mouth every morning.   multivitamin with minerals Tabs tablet Take 1 tablet by mouth every morning.   oxyCODONE-acetaminophen 5-325 MG tablet Commonly known as: PERCOCET/ROXICET Take 1 tablet by mouth every 6 (six) hours as needed for moderate pain or  severe pain.   Restora Caps Take 1 capsule by mouth at bedtime.   Senna-Plus 8.6-50 MG tablet Generic drug: senna-docusate Take 2 tablets by mouth every morning.   spironolactone 50 MG tablet Commonly known as: ALDACTONE Take 50 mg by mouth every morning.        Follow-up Information     Willey Blade, MD .   Specialty: Internal Medicine Contact information: [address] [place] [address] [phone]                  Time coordinating discharge: 39 minutes  Signed:  Joletta Manner  [facility] 06/19/2021, 9:48 AM

## 2021-06-19 NOTE — Plan of Care (Signed)
  Problem: Clinical Measurements: Goal: Will remain free from infection Outcome: Progressing   Problem: Clinical Measurements: Goal: Respiratory complications will improve Outcome: Progressing   

## 2021-06-22 LAB — GLUCOSE, CAPILLARY: Glucose-Capillary: 124 mg/dL — ABNORMAL HIGH (ref 70–99)

## 2021-07-30 ENCOUNTER — Inpatient Hospital Stay: Admission: RE | Admit: 2021-07-30 | Payer: Medicare Other | Source: Ambulatory Visit

## 2021-09-03 ENCOUNTER — Other Ambulatory Visit: Payer: Medicare Other

## 2021-11-12 ENCOUNTER — Ambulatory Visit
Admission: RE | Admit: 2021-11-12 | Discharge: 2021-11-12 | Disposition: A | Discharge status: Home / Self Care | Payer: Medicare Other | Source: Ambulatory Visit | Attending: Internal Medicine | Admitting: Internal Medicine

## 2021-11-12 DIAGNOSIS — N6320 Unspecified lump in the left breast, unspecified quadrant: Secondary | ICD-10-CM

## 2022-01-07 ENCOUNTER — Ambulatory Visit
Admission: RE | Admit: 2022-01-07 | Discharge: 2022-01-07 | Disposition: A | Discharge status: Home / Self Care | Payer: Medicare Other | Source: Ambulatory Visit | Attending: Internal Medicine | Admitting: Internal Medicine

## 2022-01-07 DIAGNOSIS — E2839 Other primary ovarian failure: Secondary | ICD-10-CM

## 2022-03-25 ENCOUNTER — Ambulatory Visit (INDEPENDENT_AMBULATORY_CARE_PROVIDER_SITE_OTHER): Payer: Medicare Other | Admitting: Podiatry

## 2022-03-25 DIAGNOSIS — B351 Tinea unguium: Secondary | ICD-10-CM

## 2022-03-25 DIAGNOSIS — M79675 Pain in left toe(s): Secondary | ICD-10-CM

## 2022-03-25 DIAGNOSIS — M79674 Pain in right toe(s): Secondary | ICD-10-CM | POA: Diagnosis not present

## 2022-03-25 NOTE — Progress Notes (Signed)
  Subjective:  Patient ID: [name], female    DOB: 09/26/1940,  MRN: [id number]  Chief Complaint  Patient presents with   Diabetes    New Patient diabetic foot care, A1C  7.2   Nail Problem    Thick painful toenails    81 y.o. female presents with the above complaint. History confirmed with patient.   Objective:  Physical Exam: warm, good capillary refill, no trophic changes or ulcerative lesions, normal DP and PT pulses, and normal sensory exam. Left Foot: dystrophic yellowed discolored nail plates with subungual debris Right Foot: dystrophic yellowed discolored nail plates with subungual debris  Assessment:   1. Pain due to onychomycosis of toenails of both feet      Plan:  Patient was evaluated and treated and all questions answered.  Discussed the etiology and treatment options for the condition in detail with the patient. Educated patient on the topical and oral treatment options for mycotic nails. Recommended debridement of the nails today. Sharp and mechanical debridement performed of all painful and mycotic nails today. Nails debrided in length and thickness using a nail nipper to level of comfort. Discussed treatment options including appropriate shoe gear. Follow up as needed for painful nails.   Several of the nails were quite loose and on debridement because of bleeding of the nailbed I applied Neosporin and a Band-Aid and they will be able to remove these later.  I think the facility has been applying something similar to a [place] nail lacquer and this seems to be damaging the nail plates further advised him to leave this alone and allow them to grow out we will debride them intermittently  Return in about 3 months (around 06/25/2022) for painful thickened nails .

## 2022-05-10 ENCOUNTER — Other Ambulatory Visit: Payer: Self-pay | Admitting: Internal Medicine

## 2022-05-10 DIAGNOSIS — Z853 Personal history of malignant neoplasm of breast: Secondary | ICD-10-CM

## 2022-06-24 ENCOUNTER — Ambulatory Visit
Admission: RE | Admit: 2022-06-24 | Discharge: 2022-06-24 | Disposition: A | Discharge status: Home / Self Care | Payer: Medicare Other | Source: Ambulatory Visit | Attending: Internal Medicine | Admitting: Internal Medicine

## 2022-06-24 DIAGNOSIS — Z853 Personal history of malignant neoplasm of breast: Secondary | ICD-10-CM

## 2022-06-29 ENCOUNTER — Ambulatory Visit (INDEPENDENT_AMBULATORY_CARE_PROVIDER_SITE_OTHER): Payer: Medicare Other | Admitting: Podiatry

## 2022-06-29 VITALS — BP 107/65 | HR 96

## 2022-06-29 DIAGNOSIS — M79674 Pain in right toe(s): Secondary | ICD-10-CM

## 2022-06-29 DIAGNOSIS — E1169 Type 2 diabetes mellitus with other specified complication: Secondary | ICD-10-CM

## 2022-06-29 DIAGNOSIS — B351 Tinea unguium: Secondary | ICD-10-CM | POA: Diagnosis not present

## 2022-06-29 DIAGNOSIS — E669 Obesity, unspecified: Secondary | ICD-10-CM

## 2022-06-29 DIAGNOSIS — M79675 Pain in left toe(s): Secondary | ICD-10-CM

## 2022-06-29 NOTE — Progress Notes (Signed)
This patient returns to my office for at risk foot care.  This patient requires this care by a professional since this patient will be at risk due to having diabetes and CKD.  This patient is unable to cut nails herself since the patient cannot reach her nails.These nails are painful walking and wearing shoes. She presents to the office in a wheelchair. This patient presents for at risk foot care today.  General Appearance  Alert, conversant and in no acute stress.  Vascular  Dorsalis pedis and posterior tibial  pulses are absent due to swelling   bilaterally.  Capillary return is within normal limits  bilaterally. Temperature is within normal limits  bilaterally.  Neurologic  Senn-Weinstein monofilament wire test within normal limits  bilaterally. Muscle power within normal limits bilaterally.  Nails Thick disfigured discolored nails with subungual debris  from hallux to fifth toes bilaterally. No evidence of bacterial infection or drainage bilaterally.  Orthopedic  No limitations of motion  feet .  No crepitus or effusions noted.  No bony pathology or digital deformities noted.  Skin  normotropic skin with no porokeratosis noted bilaterally.  No signs of infections or ulcers noted.     Onychomycosis  Pain in right toes  Pain in left toes  Consent was obtained for treatment procedures.   Mechanical debridement of nails 1-5  bilaterally performed with a nail nipper.  Filed with dremel without incident.    Return office visit    3 months                  Told patient to return for periodic foot care and evaluation due to potential at risk complications.   Gardiner Barefoot DPM

## 2022-07-13 ENCOUNTER — Other Ambulatory Visit: Payer: Self-pay | Admitting: Internal Medicine

## 2022-07-13 DIAGNOSIS — R928 Other abnormal and inconclusive findings on diagnostic imaging of breast: Secondary | ICD-10-CM

## 2022-09-28 ENCOUNTER — Ambulatory Visit: Payer: Medicare Other | Admitting: Podiatry

## 2022-09-30 ENCOUNTER — Ambulatory Visit (INDEPENDENT_AMBULATORY_CARE_PROVIDER_SITE_OTHER): Payer: Medicare Other | Admitting: Podiatry

## 2022-09-30 ENCOUNTER — Encounter: Payer: Self-pay | Admitting: Podiatry

## 2022-09-30 DIAGNOSIS — N181 Chronic kidney disease, stage 1: Secondary | ICD-10-CM | POA: Diagnosis not present

## 2022-09-30 DIAGNOSIS — M79674 Pain in right toe(s): Secondary | ICD-10-CM | POA: Diagnosis not present

## 2022-09-30 DIAGNOSIS — B351 Tinea unguium: Secondary | ICD-10-CM

## 2022-09-30 DIAGNOSIS — M79675 Pain in left toe(s): Secondary | ICD-10-CM | POA: Diagnosis not present

## 2022-09-30 NOTE — Progress Notes (Signed)
This patient returns to my office for at risk foot care.  This patient requires this care by a professional since this patient will be at risk due to having diabetes and CKD.  This patient is unable to cut nails herself since the patient cannot reach her nails.These nails are painful walking and wearing shoes. She presents to the office in a wheelchair. This patient presents for at risk foot care today.  General Appearance  Alert, conversant and in no acute stress.  Vascular  Dorsalis pedis and posterior tibial  pulses are absent due to swelling   bilaterally.  Capillary return is within normal limits  bilaterally. Temperature is within normal limits  bilaterally.  Neurologic  Senn-Weinstein monofilament wire test within normal limits  bilaterally. Muscle power within normal limits bilaterally.  Nails Thick disfigured discolored nails with subungual debris  from hallux to fifth toes bilaterally. No evidence of bacterial infection or drainage bilaterally.  Orthopedic  No limitations of motion  feet .  No crepitus or effusions noted.  No bony pathology or digital deformities noted.  Skin  normotropic skin with no porokeratosis noted bilaterally.  No signs of infections or ulcers noted.     Onychomycosis  Pain in right toes  Pain in left toes  Consent was obtained for treatment procedures.   Mechanical debridement of nails 1-5  bilaterally performed with a nail nipper.  Filed with dremel without incident.    Return office visit    3 months                  Told patient to return for periodic foot care and evaluation due to potential at risk complications.   Aharon Carriere DPM   

## 2023-01-04 ENCOUNTER — Encounter: Payer: Self-pay | Admitting: Podiatry

## 2023-01-04 ENCOUNTER — Ambulatory Visit: Payer: Medicare Other

## 2023-01-04 ENCOUNTER — Ambulatory Visit: Payer: Medicare Other | Admitting: Podiatry

## 2023-01-04 DIAGNOSIS — M79675 Pain in left toe(s): Secondary | ICD-10-CM

## 2023-01-04 DIAGNOSIS — N181 Chronic kidney disease, stage 1: Secondary | ICD-10-CM

## 2023-01-04 DIAGNOSIS — M79674 Pain in right toe(s): Secondary | ICD-10-CM

## 2023-01-04 DIAGNOSIS — B351 Tinea unguium: Secondary | ICD-10-CM | POA: Diagnosis not present

## 2023-01-04 NOTE — Addendum Note (Signed)
Addended by: Helane Gunther on: 01/04/2023 01:45 PM   Modules accepted: Orders

## 2023-01-04 NOTE — Progress Notes (Signed)
This patient returns to my office for at risk foot care.  This patient requires this care by a professional since this patient will be at risk due to having diabetes and CKD.  This patient is unable to cut nails herself since the patient cannot reach her nails.These nails are painful walking and wearing shoes. She presents to the office in a wheelchair. This patient presents for at risk foot care today.  General Appearance  Alert, conversant and in no acute stress.  Vascular  Dorsalis pedis and posterior tibial  pulses are absent due to swelling   bilaterally.  Capillary return is within normal limits  bilaterally. Temperature is within normal limits  bilaterally.  Neurologic  Senn-Weinstein monofilament wire test within normal limits  bilaterally. Muscle power within normal limits bilaterally.  Nails Thick disfigured discolored nails with subungual debris  from hallux to fifth toes bilaterally. No evidence of bacterial infection or drainage bilaterally.  Orthopedic  No limitations of motion  feet .  No crepitus or effusions noted.  No bony pathology or digital deformities noted.  Skin  normotropic skin with no porokeratosis noted bilaterally.  No signs of infections or ulcers noted.     Onychomycosis  Pain in right toes  Pain in left toes  Consent was obtained for treatment procedures.   Mechanical debridement of nails 1-5  bilaterally performed with a nail nipper.  Filed with dremel without incident.    Return office visit    3 months                  Told patient to return for periodic foot care and evaluation due to potential at risk complications.   Gardiner Barefoot DPM

## 2023-04-07 ENCOUNTER — Ambulatory Visit: Payer: Medicare Other | Admitting: Podiatry

## 2023-04-14 ENCOUNTER — Encounter: Payer: Self-pay | Admitting: Podiatry

## 2023-04-14 ENCOUNTER — Ambulatory Visit (INDEPENDENT_AMBULATORY_CARE_PROVIDER_SITE_OTHER): Payer: Medicare Other | Admitting: Podiatry

## 2023-04-14 VITALS — Ht 65.0 in | Wt 300.0 lb

## 2023-04-14 DIAGNOSIS — M79674 Pain in right toe(s): Secondary | ICD-10-CM

## 2023-04-14 DIAGNOSIS — B351 Tinea unguium: Secondary | ICD-10-CM

## 2023-04-14 DIAGNOSIS — N181 Chronic kidney disease, stage 1: Secondary | ICD-10-CM | POA: Diagnosis not present

## 2023-04-14 DIAGNOSIS — M79675 Pain in left toe(s): Secondary | ICD-10-CM | POA: Diagnosis not present

## 2023-04-14 NOTE — Progress Notes (Signed)
This patient returns to my office for at risk foot care.  This patient requires this care by a professional since this patient will be at risk due to having diabetes and CKD.  This patient is unable to cut nails herself since the patient cannot reach her nails.These nails are painful walking and wearing shoes. She presents to the office in a wheelchair. This patient presents for at risk foot care today.  General Appearance  Alert, conversant and in no acute stress.  Vascular  Dorsalis pedis and posterior tibial  pulses are absent due to swelling   bilaterally.  Capillary return is within normal limits  bilaterally. Temperature is within normal limits  bilaterally.  Neurologic  Senn-Weinstein monofilament wire test within normal limits  bilaterally. Muscle power within normal limits bilaterally.  Nails Thick disfigured discolored nails with subungual debris  from hallux to fifth toes bilaterally. No evidence of bacterial infection or drainage bilaterally.  Orthopedic  No limitations of motion  feet .  No crepitus or effusions noted.  No bony pathology or digital deformities noted.  Skin  normotropic skin with no porokeratosis noted bilaterally.  No signs of infections or ulcers noted.     Onychomycosis  Pain in right toes  Pain in left toes  Consent was obtained for treatment procedures.   Mechanical debridement of nails 1-5  bilaterally performed with a nail nipper.  Filed with dremel without incident.    Return office visit    3 months                  Told patient to return for periodic foot care and evaluation due to potential at risk complications.   Gardiner Barefoot DPM

## 2023-06-30 ENCOUNTER — Other Ambulatory Visit: Payer: Medicare Other

## 2023-07-07 ENCOUNTER — Ambulatory Visit
Admission: RE | Admit: 2023-07-07 | Discharge: 2023-07-07 | Disposition: A | Discharge status: Home / Self Care | Payer: Medicare Other | Source: Ambulatory Visit | Attending: Internal Medicine | Admitting: Internal Medicine

## 2023-07-07 DIAGNOSIS — R928 Other abnormal and inconclusive findings on diagnostic imaging of breast: Secondary | ICD-10-CM

## 2023-07-21 ENCOUNTER — Ambulatory Visit (INDEPENDENT_AMBULATORY_CARE_PROVIDER_SITE_OTHER): Payer: Medicare Other | Admitting: Podiatry

## 2023-07-21 ENCOUNTER — Encounter: Payer: Self-pay | Admitting: Podiatry

## 2023-07-21 DIAGNOSIS — B351 Tinea unguium: Secondary | ICD-10-CM

## 2023-07-21 DIAGNOSIS — M79674 Pain in right toe(s): Secondary | ICD-10-CM

## 2023-07-21 DIAGNOSIS — M79675 Pain in left toe(s): Secondary | ICD-10-CM

## 2023-07-21 DIAGNOSIS — N181 Chronic kidney disease, stage 1: Secondary | ICD-10-CM | POA: Diagnosis not present

## 2023-07-21 NOTE — Progress Notes (Signed)
This patient returns to my office for at risk foot care.  This patient requires this care by a professional since this patient will be at risk due to having diabetes and CKD.  This patient is unable to cut nails herself since the patient cannot reach her nails.These nails are painful walking and wearing shoes. She presents to the office in a wheelchair. This patient presents for at risk foot care today.  General Appearance  Alert, conversant and in no acute stress.  Vascular  Dorsalis pedis and posterior tibial  pulses are absent due to swelling   bilaterally.  Capillary return is within normal limits  bilaterally. Temperature is within normal limits  bilaterally.  Neurologic  Senn-Weinstein monofilament wire test within normal limits  bilaterally. Muscle power within normal limits bilaterally.  Nails Thick disfigured discolored nails with subungual debris  from hallux to fifth toes bilaterally. No evidence of bacterial infection or drainage bilaterally.  Orthopedic  No limitations of motion  feet .  No crepitus or effusions noted.  No bony pathology or digital deformities noted.  Skin  normotropic skin with no porokeratosis noted bilaterally.  No signs of infections or ulcers noted.     Onychomycosis  Pain in right toes  Pain in left toes  Consent was obtained for treatment procedures.   Mechanical debridement of nails 1-5  bilaterally performed with a nail nipper.  Filed with dremel without incident.    Return office visit    3 months                  Told patient to return for periodic foot care and evaluation due to potential at risk complications.   Gardiner Barefoot DPM

## 2023-10-20 ENCOUNTER — Ambulatory Visit (INDEPENDENT_AMBULATORY_CARE_PROVIDER_SITE_OTHER): Payer: Medicare Other | Admitting: Podiatry

## 2023-10-20 ENCOUNTER — Encounter: Payer: Self-pay | Admitting: Podiatry

## 2023-10-20 DIAGNOSIS — M79675 Pain in left toe(s): Secondary | ICD-10-CM | POA: Diagnosis not present

## 2023-10-20 DIAGNOSIS — M79674 Pain in right toe(s): Secondary | ICD-10-CM

## 2023-10-20 DIAGNOSIS — N181 Chronic kidney disease, stage 1: Secondary | ICD-10-CM | POA: Diagnosis not present

## 2023-10-20 DIAGNOSIS — B351 Tinea unguium: Secondary | ICD-10-CM

## 2023-10-20 NOTE — Progress Notes (Signed)
 This patient returns to my office for at risk foot care.  This patient requires this care by a professional since this patient will be at risk due to having diabetes and CKD.  This patient is unable to cut nails herself since the patient cannot reach her nails.These nails are painful walking and wearing shoes. She presents to the office in a wheelchair. This patient presents for at risk foot care today.  General Appearance  Alert, conversant and in no acute stress.  Vascular  Dorsalis pedis and posterior tibial  pulses are absent due to swelling   bilaterally.  Capillary return is within normal limits  bilaterally. Temperature is within normal limits  bilaterally.  Purplish feet with swelling  B/L.  Neurologic  Senn-Weinstein monofilament wire test within normal limits  bilaterally. Muscle power within normal limits bilaterally.  Nails Thick disfigured discolored nails with subungual debris  from hallux to fifth toes bilaterally. No evidence of bacterial infection or drainage bilaterally.  Orthopedic  No limitations of motion  feet .  No crepitus or effusions noted.  No bony pathology or digital deformities noted.  Skin  normotropic skin with no porokeratosis noted bilaterally.  No signs of infections or ulcers noted.     Onychomycosis  Pain in right toes  Pain in left toes  Consent was obtained for treatment procedures.   Mechanical debridement of nails 1-5  bilaterally performed with a nail nipper.  Filed with dremel without incident. Told  her to elevate her feet.   Return office visit    3 months                  Told patient to return for periodic foot care and evaluation due to potential at risk complications.   Ruffin Cotton DPM

## 2023-12-22 ENCOUNTER — Ambulatory Visit: Admitting: Physical Therapy

## 2023-12-22 ENCOUNTER — Ambulatory Visit: Attending: Specialist | Admitting: Rehabilitation

## 2024-01-19 ENCOUNTER — Encounter: Payer: Self-pay | Admitting: Podiatry

## 2024-01-19 ENCOUNTER — Ambulatory Visit (INDEPENDENT_AMBULATORY_CARE_PROVIDER_SITE_OTHER): Admitting: Podiatry

## 2024-01-19 DIAGNOSIS — N181 Chronic kidney disease, stage 1: Secondary | ICD-10-CM

## 2024-01-19 DIAGNOSIS — M79675 Pain in left toe(s): Secondary | ICD-10-CM

## 2024-01-19 DIAGNOSIS — B351 Tinea unguium: Secondary | ICD-10-CM

## 2024-01-19 DIAGNOSIS — M79674 Pain in right toe(s): Secondary | ICD-10-CM | POA: Diagnosis not present

## 2024-01-19 NOTE — Progress Notes (Signed)
 This patient returns to my office for at risk foot care.  This patient requires this care by a professional since this patient will be at risk due to having diabetes and CKD.  This patient is unable to cut nails herself since the patient cannot reach her nails.These nails are painful walking and wearing shoes. She presents to the office in a wheelchair. This patient presents for at risk foot care today.  General Appearance  Alert, conversant and in no acute stress.  Vascular  Dorsalis pedis and posterior tibial  pulses are absent due to swelling   bilaterally.  Capillary return is within normal limits  bilaterally. Temperature is within normal limits  bilaterally.  Purplish feet with swelling  B/L.  Neurologic  Senn-Weinstein monofilament wire test within normal limits  bilaterally. Muscle power within normal limits bilaterally.  Nails Thick disfigured discolored nails with subungual debris  from hallux to fifth toes bilaterally. No evidence of bacterial infection or drainage bilaterally.  Orthopedic  No limitations of motion  feet .  No crepitus or effusions noted.  No bony pathology or digital deformities noted.  Skin  normotropic skin with no porokeratosis noted bilaterally.  No signs of infections or ulcers noted.     Onychomycosis  Pain in right toes  Pain in left toes  Consent was obtained for treatment procedures.   Mechanical debridement of nails 1-5  bilaterally performed with a nail nipper.  Filed with dremel without incident. Told  her to elevate her feet.   Return office visit    3 months                  Told patient to return for periodic foot care and evaluation due to potential at risk complications.   Ruffin Cotton DPM

## 2024-04-26 ENCOUNTER — Ambulatory Visit: Admitting: Podiatry

## 2024-06-05 ENCOUNTER — Encounter: Payer: Self-pay | Admitting: Podiatry

## 2024-06-05 ENCOUNTER — Ambulatory Visit (INDEPENDENT_AMBULATORY_CARE_PROVIDER_SITE_OTHER): Admitting: Podiatry

## 2024-06-05 DIAGNOSIS — N181 Chronic kidney disease, stage 1: Secondary | ICD-10-CM

## 2024-06-05 DIAGNOSIS — B351 Tinea unguium: Secondary | ICD-10-CM | POA: Diagnosis not present

## 2024-06-05 DIAGNOSIS — M79674 Pain in right toe(s): Secondary | ICD-10-CM | POA: Diagnosis not present

## 2024-06-05 DIAGNOSIS — M79675 Pain in left toe(s): Secondary | ICD-10-CM

## 2024-06-05 NOTE — Progress Notes (Signed)
 This patient returns to my office for at risk foot care.  This patient requires this care by a professional since this patient will be at risk due to having diabetes and CKD.  This patient is unable to cut nails herself since the patient cannot reach her nails.These nails are painful walking and wearing shoes. She presents to the office in a wheelchair. This patient presents for at risk foot care today.  General Appearance  Alert, conversant and in no acute stress.  Vascular  Dorsalis pedis and posterior tibial  pulses are absent due to swelling   bilaterally.  Capillary return is within normal limits  bilaterally. Temperature is within normal limits  bilaterally.  Purplish feet with swelling  B/L.  Neurologic  Senn-Weinstein monofilament wire test within normal limits  bilaterally. Muscle power within normal limits bilaterally.  Nails Thick disfigured discolored nails with subungual debris  from hallux to fifth toes bilaterally. No evidence of bacterial infection or drainage bilaterally.  Orthopedic  No limitations of motion  feet .  No crepitus or effusions noted.  No bony pathology or digital deformities noted.  Skin  normotropic skin with no porokeratosis noted bilaterally.  No signs of infections or ulcers noted.     Onychomycosis  Pain in right toes  Pain in left toes  Consent was obtained for treatment procedures.   Mechanical debridement of nails 1-5  bilaterally performed with a nail nipper.  Filed with dremel without incident. Told  her to elevate her feet.   Return office visit    3 months                  Told patient to return for periodic foot care and evaluation due to potential at risk complications.   Ruffin Cotton DPM

## 2024-09-04 ENCOUNTER — Ambulatory Visit: Admitting: Podiatry
